# Patient Record
Sex: Male | Born: 1955
Health system: Southern US, Community
[De-identification: ages and names within clinical notes are randomized; demographics above are authoritative.]

## PROBLEM LIST (undated history)

## (undated) DIAGNOSIS — E119 Type 2 diabetes mellitus without complications: Secondary | ICD-10-CM

## (undated) DIAGNOSIS — G4733 Obstructive sleep apnea (adult) (pediatric): Secondary | ICD-10-CM

## (undated) DIAGNOSIS — E785 Hyperlipidemia, unspecified: Secondary | ICD-10-CM

## (undated) DIAGNOSIS — C169 Malignant neoplasm of stomach, unspecified: Secondary | ICD-10-CM

## (undated) DIAGNOSIS — E079 Disorder of thyroid, unspecified: Secondary | ICD-10-CM

## (undated) DIAGNOSIS — E291 Testicular hypofunction: Secondary | ICD-10-CM

## (undated) DIAGNOSIS — I2510 Atherosclerotic heart disease of native coronary artery without angina pectoris: Secondary | ICD-10-CM

## (undated) DIAGNOSIS — I10 Essential (primary) hypertension: Secondary | ICD-10-CM

## (undated) HISTORY — DX: Hyperlipidemia, unspecified: E78.5

## (undated) HISTORY — DX: Disorder of thyroid, unspecified: E07.9

## (undated) HISTORY — DX: Obstructive sleep apnea (adult) (pediatric): G47.33

## (undated) HISTORY — DX: Type 2 diabetes mellitus without complications: E11.9

## (undated) HISTORY — DX: Atherosclerotic heart disease of native coronary artery without angina pectoris: I25.10

## (undated) HISTORY — PX: APPENDECTOMY: SHX54

## (undated) HISTORY — DX: Testicular hypofunction: E29.1

## (undated) HISTORY — PX: CARDIAC CATHETERIZATION: SHX172

## (undated) HISTORY — DX: Essential (primary) hypertension: I10

---

## 1999-07-28 ENCOUNTER — Encounter: Payer: Self-pay | Admitting: *Deleted

## 1999-07-28 ENCOUNTER — Ambulatory Visit (HOSPITAL_COMMUNITY): Admission: RE | Admit: 1999-07-28 | Discharge: 1999-07-28 | Payer: Self-pay | Admitting: *Deleted

## 2000-03-08 ENCOUNTER — Ambulatory Visit (HOSPITAL_COMMUNITY): Admission: RE | Admit: 2000-03-08 | Discharge: 2000-03-08 | Payer: Self-pay | Admitting: *Deleted

## 2000-03-08 ENCOUNTER — Encounter: Payer: Self-pay | Admitting: *Deleted

## 2000-09-05 ENCOUNTER — Ambulatory Visit (HOSPITAL_COMMUNITY): Admission: RE | Admit: 2000-09-05 | Discharge: 2000-09-05 | Payer: Self-pay | Admitting: *Deleted

## 2000-09-05 ENCOUNTER — Encounter (INDEPENDENT_AMBULATORY_CARE_PROVIDER_SITE_OTHER): Payer: Self-pay | Admitting: *Deleted

## 2003-09-28 ENCOUNTER — Ambulatory Visit (HOSPITAL_BASED_OUTPATIENT_CLINIC_OR_DEPARTMENT_OTHER): Admission: RE | Admit: 2003-09-28 | Discharge: 2003-09-28 | Payer: Self-pay | Admitting: Internal Medicine

## 2008-09-11 ENCOUNTER — Emergency Department (HOSPITAL_COMMUNITY): Admission: EM | Admit: 2008-09-11 | Discharge: 2008-09-11 | Payer: Self-pay | Admitting: *Deleted

## 2010-11-12 ENCOUNTER — Ambulatory Visit (HOSPITAL_COMMUNITY)
Admission: RE | Admit: 2010-11-12 | Discharge: 2010-11-12 | Payer: Self-pay | Source: Home / Self Care | Admitting: Cardiology

## 2011-02-23 LAB — GLUCOSE, CAPILLARY
Glucose-Capillary: 144 mg/dL — ABNORMAL HIGH (ref 70–99)
Glucose-Capillary: 171 mg/dL — ABNORMAL HIGH (ref 70–99)

## 2013-10-11 ENCOUNTER — Encounter: Payer: Self-pay | Admitting: Podiatry

## 2013-10-11 ENCOUNTER — Ambulatory Visit (INDEPENDENT_AMBULATORY_CARE_PROVIDER_SITE_OTHER): Payer: BC Managed Care – PPO | Admitting: Podiatry

## 2013-10-11 VITALS — BP 140/79 | HR 70 | Resp 20 | Ht 72.0 in | Wt 260.0 lb

## 2013-10-11 DIAGNOSIS — M722 Plantar fascial fibromatosis: Secondary | ICD-10-CM

## 2013-10-11 NOTE — Progress Notes (Signed)
Zachary Bowers presents today to pick up his orthotics. He also states he stepped on a nail. States that the foot feels fine no problems from the nail. As he reversed the left foot.  Objective: Vital signs are stable he is alert and oriented x3 pulses remain palpable to the bilateral lower extremity. Evaluation of the left foot does not demonstrate any signs of puncture wound to the plantar aspect left. This is obviously going to heal uneventfully. Orthotics were dispensed and orthotics were not correct they will be sent back.  Assessment: Plantar fasciitis pes planus puncture wound left.  Plan:  Seen back  his orthotics will followup with him was to come in.

## 2013-10-11 NOTE — Patient Instructions (Signed)

## 2013-11-05 ENCOUNTER — Encounter: Payer: Self-pay | Admitting: Podiatry

## 2014-03-25 ENCOUNTER — Encounter: Payer: Self-pay | Admitting: Cardiology

## 2014-03-25 ENCOUNTER — Ambulatory Visit (INDEPENDENT_AMBULATORY_CARE_PROVIDER_SITE_OTHER): Payer: BC Managed Care – PPO | Admitting: Cardiology

## 2014-03-25 VITALS — BP 150/80 | HR 74 | Ht 72.0 in | Wt 254.8 lb

## 2014-03-25 DIAGNOSIS — I10 Essential (primary) hypertension: Secondary | ICD-10-CM | POA: Insufficient documentation

## 2014-03-25 DIAGNOSIS — E669 Obesity, unspecified: Secondary | ICD-10-CM | POA: Insufficient documentation

## 2014-03-25 DIAGNOSIS — I208 Other forms of angina pectoris: Secondary | ICD-10-CM | POA: Insufficient documentation

## 2014-03-25 DIAGNOSIS — I25810 Atherosclerosis of coronary artery bypass graft(s) without angina pectoris: Secondary | ICD-10-CM

## 2014-03-25 DIAGNOSIS — E7800 Pure hypercholesterolemia, unspecified: Secondary | ICD-10-CM | POA: Insufficient documentation

## 2014-03-25 MED ORDER — ISOSORBIDE MONONITRATE ER 30 MG PO TB24: 30.0000 mg | ORAL_TABLET | Freq: Every day | ORAL | Status: DC

## 2014-03-25 MED ORDER — ATORVASTATIN CALCIUM 20 MG PO TABS: 20.0000 mg | ORAL_TABLET | Freq: Every day | ORAL | Status: DC

## 2014-03-25 NOTE — Patient Instructions (Signed)
Your physician has recommended you make the following change in your medication:   1. Stop Simvastatin 2. Start Atorvastatin 20 mg once daily. 3. Start Imdur 30 mg once daily.  Your physician recommends that you return for lab work in: 3 months, Lipid, ALT  Your physician recommends that you schedule a follow-up appointment in: 3 months

## 2014-03-25 NOTE — Progress Notes (Signed)
[facility] [facility]. [address] [place], [place]  [address] Phone: [phone] Fax:  [phone]  Date:  03/25/2014   ID:  Zachary Bowers, DOB December 04, 1956, MRN [id number]  PCP:  Shirline Frees, MD   History of Present Illness: Zachary Bowers is a 58 y.o. male with obesity, hypertension, diabetes, obstructive sleep apnea, with moderate CAD from cath 11/11 - (hazy 60% LAD stenosis distal to first diagonal branch, 30-40% stenosis first obtuse marginal branch takeoff, aneurysmal segment distal RCA bifurcation of PDA, normal EF-no anterior ischemia on nuclear stress, mild inferior ischemia-med mgt. LDL was 147 down to 90's.  A nuclear stress test was performed 11/11and showed a mild degree of ischemia along the inferior wall distribution. His cardiac catheterization was once again reviewed with him and revealed a moderate lesion in the LAD up to 60%.  RCA distribution most likely responsible for his ischemia. Because of this, we opted for medical management.    He still states that still has shortness of breath and chest pain. He knows to call me if he is having any further worsening symptoms. My goal is to delay any worsening of his coronary artery disease. Getting pain once or twice a week. Lasts about 20-68min. Can be while sitting. Felt hard to swallow at the time. however does not correlate with eating. I offered him nitroglycerin PRN, he did not wish to take that at this time. We discussed isosorbide and he seems willing to take.  Same discussion as prior.      Wt Readings from Last 3 Encounters:  03/25/14 254 lb 12.8 oz (115.577 kg)  10/11/13 260 lb (117.935 kg)     Past Medical History  Diagnosis Date  . Diabetes mellitus without complication   . Thyroid disease   . Hypertension     Past Surgical History  Procedure Laterality Date  . Appendectomy      Current Outpatient Prescriptions  Medication Sig Dispense Refill  . amLODipine (NORVASC) 5 MG tablet Take 5 mg by  mouth daily.      Marland Kitchen aspirin 81 MG tablet Take 81 mg by mouth daily.      Marland Kitchen glucose blood test strip 1 each by Other route as needed for other. Use as instructed      . levothyroxine (SYNTHROID, LEVOTHROID) 75 MCG tablet Take 75 mcg by mouth daily before breakfast.      . lisinopril (PRINIVIL,ZESTRIL) 20 MG tablet Take 20 mg by mouth daily.      . metoprolol succinate (TOPROL-XL) 50 MG 24 hr tablet Take 50 mg by mouth daily. Take with or immediately following a meal.      . simvastatin (ZOCOR) 20 MG tablet Take 20 mg by mouth every evening.       No current facility-administered medications for this visit.    Allergies:    Allergies  Allergen Reactions  . Niaspan [Niacin Er] Itching    Social History:  The patient  reports that he has quit smoking. He does not have any smokeless tobacco history on file. He reports that he does not drink alcohol or use illicit drugs.   ROS:  Please see the history of present illness.   Denies fevers, chills, orthopnea, PND    PHYSICAL EXAM: VS:  BP 150/80  Pulse 74  Ht 6' (1.829 m)  Wt 254 lb 12.8 oz (115.577 kg)  BMI 34.55 kg/m2 Well nourished, well developed, in no acute distress HEENT: normal Neck: no JVD Cardiac:  normal S1, S2; RRR; no murmur Lungs:  clear to auscultation bilaterally, no wheezing, rhonchi or rales Abd: soft, nontender, no hepatomegalyOverweight Ext: no edema Skin: warm and dry Neuro: no focal abnormalities noted  EKG:  03/25/14 - Sinus rhythm rate 74 with nonspecific ST flattening, possible old inferior infarct pattern.    ASSESSMENT AND PLAN:  1. Coronary artery disease-moderate severity, 60% moderate hazy LAD lesion after first diagonal takeoff. The RCA with aneurysmal segment to the PDA. Inferior wall ischemia on previous 2011 nuclear stress test. I do not believe that based upon nuclear stress test results LAD lesion was hemodynamically significant. Because of this, we continued with aggressive medical management,  hypertension, lipid control. Secondary prevention strategies. He is willing to try isosorbide 30 mg once a day. He was hesitant about nitroglycerin as he is had relatives take this in the past. We discussed the possibility of pursuing further testing such as repeating stress test or cardiac catheterization. At this time we will continue with aggressive medical management. 2. Angina-as above. 3. Hyperlipidemia-LDL goal 70. We will change him from simvastatin to atorvastatin 20 mg. Recheck lipid profile in 3 months. Increase atorvastatin is necessary. LDL goal 70. 4. Hypertension-medications reviewed. 5. Obesity-continue to encourage weight loss.  Signed, Candee Furbish, MD [facility]  03/25/2014 9:40 AM

## 2014-03-28 ENCOUNTER — Telehealth: Payer: Self-pay | Admitting: Cardiology

## 2014-03-28 NOTE — Telephone Encounter (Signed)
Since the patient started Imdur and Atorvastatin, he states he has had frequent headaches, stomach cramps and bloating, advised to hold both medication and I would send to Dr. Marlou Porch for review and call the patient back.

## 2014-03-28 NOTE — Telephone Encounter (Signed)
New Message:  Pt states he is calling about the meds he was placed on as of his last appt 4/13.... States ever since he has had a headache and his neck and shoulders are sore.. Pt would like a call back.Marland KitchenMarland Kitchen

## 2014-03-29 MED ORDER — ATORVASTATIN CALCIUM 20 MG PO TABS: 10.0000 mg | ORAL_TABLET | Freq: Every day | ORAL | Status: DC

## 2014-03-29 NOTE — Telephone Encounter (Signed)
Agree with holding both isosorbide and atorvastatin. The headache is likely from the isosorbide. Shoulder soreness may be from atorvastatin. What I would suggest is after approximately one more week, restart atorvastatin at half dose and monitor symptoms. We will stay off of isosorbide at this time.

## 2014-06-27 ENCOUNTER — Ambulatory Visit (INDEPENDENT_AMBULATORY_CARE_PROVIDER_SITE_OTHER): Payer: BC Managed Care – PPO | Admitting: Cardiology

## 2014-06-27 ENCOUNTER — Encounter: Payer: Self-pay | Admitting: Cardiology

## 2014-06-27 VITALS — BP 137/78 | HR 73 | Ht 72.0 in | Wt 254.0 lb

## 2014-06-27 DIAGNOSIS — I2089 Other forms of angina pectoris: Secondary | ICD-10-CM

## 2014-06-27 DIAGNOSIS — E669 Obesity, unspecified: Secondary | ICD-10-CM

## 2014-06-27 DIAGNOSIS — I25810 Atherosclerosis of coronary artery bypass graft(s) without angina pectoris: Secondary | ICD-10-CM

## 2014-06-27 DIAGNOSIS — I10 Essential (primary) hypertension: Secondary | ICD-10-CM

## 2014-06-27 DIAGNOSIS — I208 Other forms of angina pectoris: Secondary | ICD-10-CM

## 2014-06-27 NOTE — Patient Instructions (Signed)
The current medical regimen is effective;  continue present plan and medications.  Your physician has requested that you have a lexiscan myoview. For further information please visit [email or web address]. Please follow instruction sheet, as given.  Follow up in 3 months with Dr Marlou Porch.

## 2014-06-27 NOTE — Progress Notes (Addendum)
[place]. [address], [address] [place], [place]  [address] Phone: [phone] Fax:  [phone]  Date:  06/27/2014   ID:  Zachary Bowers, DOB May 14, 1956, MRN [id number]  PCP:  Shirline Frees, MD   History of Present Illness: Zachary Bowers is a 58 y.o. male with obesity, hypertension, diabetes, obstructive sleep apnea, with moderate CAD from cath 11/11 - (hazy 60% LAD stenosis distal to first diagonal branch, 30-40% stenosis first obtuse marginal branch takeoff, aneurysmal segment distal RCA bifurcation of PDA, normal EF-no anterior ischemia on nuclear stress, mild inferior ischemia-med mgt. LDL was 147 down to 90's.  A nuclear stress test was performed 11/11and showed a mild degree of ischemia along the inferior wall distribution. His cardiac catheterization was once again reviewed with him and revealed a moderate lesion in the LAD up to 60%.  RCA distribution most likely responsible for his ischemia. Because of this, we opted for medical management.    He still states that still has shortness of breath and chest pain. He knows to call me if he is having any further worsening symptoms. My goal is to delay any worsening of his coronary artery disease. Getting pain once or twice a week. Lasts about 20-38min. Can be while sitting. Felt hard to swallow at the time. however does not correlate with eating. I offered him nitroglycerin PRN, he did not wish to take that at this time. Same discussion as prior. We tried isosorbide and atorvastatin but he did not tolerate very well. He is taking very low-dose of atorvastatin.   Wt Readings from Last 3 Encounters:  06/27/14 254 lb (115.214 kg)  03/25/14 254 lb 12.8 oz (115.577 kg)  10/11/13 260 lb (117.935 kg)     Past Medical History  Diagnosis Date  . Diabetes mellitus without complication   . Thyroid disease   . Hypertension     Past Surgical History  Procedure Laterality Date  . Appendectomy      Current Outpatient Prescriptions   Medication Sig Dispense Refill  . amLODipine (NORVASC) 5 MG tablet Take 5 mg by mouth daily.      Marland Kitchen aspirin 81 MG tablet Take 81 mg by mouth daily.      Marland Kitchen atorvastatin (LIPITOR) 20 MG tablet Take 0.5 tablets (10 mg total) by mouth daily.  30 tablet  4  . Cyanocobalamin (VITAMIN B-12 IJ) Inject as directed as directed.      Marland Kitchen glucose blood test strip 1 each by Other route as needed for other. Use as instructed      . levothyroxine (SYNTHROID, LEVOTHROID) 75 MCG tablet Take 75 mcg by mouth daily before breakfast.      . lisinopril (PRINIVIL,ZESTRIL) 20 MG tablet Take 20 mg by mouth daily.      . metFORMIN (GLUCOPHAGE) 500 MG tablet 4 tabs po qd      . metoprolol succinate (TOPROL-XL) 50 MG 24 hr tablet Take 50 mg by mouth daily. Take with or immediately following a meal.       No current facility-administered medications for this visit.    Allergies:    Allergies  Allergen Reactions  . Niaspan [Niacin Er] Itching    Social History:  The patient  reports that he has quit smoking. He does not have any smokeless tobacco history on file. He reports that he does not drink alcohol or use illicit drugs.   ROS:  Please see the history of present illness.   Denies fevers, chills, orthopnea,  PND    PHYSICAL EXAM: VS:  BP 137/78  Pulse 73  Ht 6' (1.829 m)  Wt 254 lb (115.214 kg)  BMI 34.44 kg/m2 Well nourished, well developed, in no acute distress HEENT: normal Neck: no JVD Cardiac:  normal S1, S2; RRR; no murmur Lungs:  clear to auscultation bilaterally, no wheezing, rhonchi or rales Abd: soft, nontender, no hepatomegalyOverweight Ext: no edema Skin: warm and dry Neuro: no focal abnormalities noted  EKG: 06/27/14-sinus rhythm, 73, nonspecific ST-T wave changes/flattening.   03/25/14 - Sinus rhythm rate 74 with nonspecific ST flattening, possible old inferior infarct pattern.    ASSESSMENT AND PLAN:  1. Coronary artery disease-moderate severity, 60% moderate hazy LAD lesion after  first diagonal takeoff. The RCA with aneurysmal segment to the PDA. Inferior wall ischemia on previous 2011 nuclear stress test. We will proceed with nuclear stress test since it has been several years and his symptoms continue to the present and slightly progress. Secondary prevention strategies. Did not tolerate isosorbide. He was hesitant about nitroglycerin as he is had relatives take this in the past.  2. Angina-as above. 3. Hyperlipidemia-LDL goal 70. We will change him from simvastatin to atorvastatin 20 mg. Recheck lipid profile in 3 months. Increase atorvastatin is necessary. LDL goal 70. 4. Hypertension-medications reviewed. 5. Obesity-continue to encourage weight loss. 6. 3 month f/u  Signed, Candee Furbish, MD [facility]  06/27/2014 8:57 AM   Addendum: 07/05/14  Nuclear stress test overall was unchanged from prior nuclear stress test. There is mild inferior wall ischemia, overall low risk. We will continue with medical management. I'm comfortable with him proceeding with colonoscopy, Dr. Paulita Fujita. If his chest pain symptoms worsen or become more worrisome, cardiac catheterization can be entertained. He did not tolerate nitrates. Continue with beta blocker.  I will forward to Dr. Paulita Fujita.  Candee Furbish, MD

## 2014-07-04 ENCOUNTER — Ambulatory Visit (HOSPITAL_COMMUNITY): Payer: BC Managed Care – PPO | Attending: Cardiology | Admitting: Radiology

## 2014-07-04 VITALS — BP 112/74 | Ht 72.0 in | Wt 250.0 lb

## 2014-07-04 DIAGNOSIS — R0602 Shortness of breath: Secondary | ICD-10-CM | POA: Insufficient documentation

## 2014-07-04 DIAGNOSIS — E119 Type 2 diabetes mellitus without complications: Secondary | ICD-10-CM | POA: Insufficient documentation

## 2014-07-04 DIAGNOSIS — Z8249 Family history of ischemic heart disease and other diseases of the circulatory system: Secondary | ICD-10-CM | POA: Insufficient documentation

## 2014-07-04 DIAGNOSIS — Z87891 Personal history of nicotine dependence: Secondary | ICD-10-CM | POA: Insufficient documentation

## 2014-07-04 DIAGNOSIS — I208 Other forms of angina pectoris: Secondary | ICD-10-CM

## 2014-07-04 DIAGNOSIS — R079 Chest pain, unspecified: Secondary | ICD-10-CM

## 2014-07-04 DIAGNOSIS — I2510 Atherosclerotic heart disease of native coronary artery without angina pectoris: Secondary | ICD-10-CM | POA: Insufficient documentation

## 2014-07-04 DIAGNOSIS — I25810 Atherosclerosis of coronary artery bypass graft(s) without angina pectoris: Secondary | ICD-10-CM

## 2014-07-04 MED ORDER — TECHNETIUM TC 99M SESTAMIBI GENERIC - CARDIOLITE
10.0000 | Freq: Once | INTRAVENOUS | Status: AC | PRN
  Administered 2014-07-04: 10 via INTRAVENOUS

## 2014-07-04 MED ORDER — TECHNETIUM TC 99M SESTAMIBI GENERIC - CARDIOLITE
30.0000 | Freq: Once | INTRAVENOUS | Status: AC | PRN
  Administered 2014-07-04: 30 via INTRAVENOUS

## 2014-07-04 MED ORDER — REGADENOSON 0.4 MG/5ML IV SOLN
0.4000 mg | Freq: Once | INTRAVENOUS | Status: AC
  Administered 2014-07-04: 0.4 mg via INTRAVENOUS

## 2014-07-04 NOTE — Progress Notes (Signed)
[facility] [address] [place], [place] [address] [phone]    Cardiology Nuclear Med Study  Zachary Bowers is a 58 y.o. male     MRN : [id number]     DOB: Oct 19, 1956  Procedure Date: 07/04/2014  Nuclear Med Background Indication for Stress Test:  Evaluation for Ischemia and Abnormal EKG History:  CAD-CATH-N/O Tx Rx, '11 MPI: Ischemia Inferior wall Cardiac Risk Factors: Family History - CAD, History of Smoking, Lipids and NIDDM  Symptoms:  Chest Pain and SOB   Nuclear Pre-Procedure Caffeine/Decaff Intake:  None NPO After: 7:00pm   Lungs:  clear O2 Sat: 98% on room air. IV 0.9% NS with Angio Cath:  22g  IV Site: R Hand  IV Started by:  Crissie Figures, RN  Chest Size (in):  48 Cup Size: n/a  Height: 6' (1.829 m)  Weight:  250 lb (113.399 kg)  BMI:  Body mass index is 33.9 kg/(m^2). Tech Comments:  N/A    Nuclear Med Study 1 or 2 day study: 1 day  Stress Test Type:  Lexiscan  Reading MD: N/A  Order Authorizing Provider:  Candee Furbish, MD  Resting Radionuclide: Technetium 37m Sestamibi  Resting Radionuclide Dose: 11.0 mCi   Stress Radionuclide:  Technetium 22m Sestamibi  Stress Radionuclide Dose: 33.0 mCi           Stress Protocol Rest HR: 62 Stress HR: 99  Rest BP: 112/74 Stress BP: 127/75  Exercise Time (min): n/a METS: n/a   Predicted Max HR: 161 bpm % Max HR: 61.49 bpm Rate Pressure Product: 12573   Dose of Adenosine (mg):  n/a Dose of Lexiscan: 0.4 mg  Dose of Atropine (mg): n/a Dose of Dobutamine: n/a mcg/kg/min (at max HR)  Stress Test Technologist: Perrin Maltese, EMT-P  Nuclear Technologist:  Vedia Pereyra, CNMT     Rest Procedure:  Myocardial perfusion imaging was performed at rest 45 minutes following the intravenous administration of Technetium 63m Sestamibi. Rest ECG: NSR low voltage nonspecific ST segment changes  Stress Procedure:  The patient received IV Lexiscan 0.4 mg over 15-seconds.  Technetium 38m Sestamibi  injected at 30-seconds. This patient had fatigue, sob, and a headache with the Lexiscan injection. Quantitative spect images were obtained after a 45 minute delay. Stress ECG: Abnormal with inferior T wave changes  QPS Raw Data Images:  Normal; no motion artifact; normal heart/lung ratio. Stress Images:  There is decreased uptake in the inferior wall. Rest Images:  Normal homogeneous uptake in all areas of the myocardium. Subtraction (SDS):  These findings are consistent with ischemia. Transient Ischemic Dilatation (Normal <1.22):  1.09 Lung/Heart Ratio (Normal <0.45):  0.32  Quantitative Gated Spect Images QGS EDV:  139 ml QGS ESV:  72 ml  Impression Exercise Capacity:  Lexiscan with low level exercise. BP Response:  Normal blood pressure response. Clinical Symptoms:  There is dyspnea. ECG Impression:  Significant ST abnormalities consistent with ischemia. Comparison with Prior Nuclear Study: No images to compare  Overall Impression:  Low risk stress nuclear study Mild inferior wall ischemia at mid and basal level  EF mildly reduced.  LV Ejection Fraction: 48%.  LV Wall Motion:  Mildly reduced diffuse  [place]

## 2014-07-09 ENCOUNTER — Telehealth: Payer: Self-pay | Admitting: Cardiology

## 2014-07-09 NOTE — Telephone Encounter (Signed)
Reviewed results of testing with pt and advised he is to f/u in 6 months per Dr Marlou Porch.

## 2014-07-09 NOTE — Telephone Encounter (Signed)
Follow up    Patient calling back stating the nurse called him today.

## 2015-01-13 ENCOUNTER — Ambulatory Visit (INDEPENDENT_AMBULATORY_CARE_PROVIDER_SITE_OTHER): Payer: BC Managed Care – PPO | Admitting: Cardiology

## 2015-01-13 ENCOUNTER — Other Ambulatory Visit: Payer: Self-pay | Admitting: *Deleted

## 2015-01-13 ENCOUNTER — Encounter: Payer: Self-pay | Admitting: Cardiology

## 2015-01-13 VITALS — BP 130/84 | HR 75 | Ht 72.0 in | Wt 251.0 lb

## 2015-01-13 DIAGNOSIS — E785 Hyperlipidemia, unspecified: Secondary | ICD-10-CM

## 2015-01-13 DIAGNOSIS — I208 Other forms of angina pectoris: Secondary | ICD-10-CM

## 2015-01-13 DIAGNOSIS — I2583 Coronary atherosclerosis due to lipid rich plaque: Principal | ICD-10-CM

## 2015-01-13 DIAGNOSIS — I2510 Atherosclerotic heart disease of native coronary artery without angina pectoris: Secondary | ICD-10-CM

## 2015-01-13 DIAGNOSIS — I10 Essential (primary) hypertension: Secondary | ICD-10-CM

## 2015-01-13 NOTE — Patient Instructions (Signed)
Your physician recommends that you continue on your current medications as directed. Please refer to the Current Medication list given to you today.  Your physician wants you to follow-up in: 6 months with Dr. Skains. You will receive a reminder letter in the mail two months in advance. If you don't receive a letter, please call our office to schedule the follow-up appointment.  

## 2015-01-13 NOTE — Progress Notes (Signed)
[place]. [address], [address] [place], [place]  [address] Phone: [phone] Fax:  [phone]  Date:  01/13/2015   ID:  Zachary Bowers, DOB 07-14-1956, MRN [id number]  PCP:  Shirline Frees, MD   History of Present Illness: Zachary Bowers is a 59 y.o. male with obesity, hypertension, diabetes, obstructive sleep apnea, with moderate CAD from cath 11/11 - (hazy 60% LAD stenosis distal to first diagonal branch, 30-40% stenosis first obtuse marginal branch takeoff, aneurysmal segment distal RCA bifurcation of PDA, normal EF-no anterior ischemia on nuclear stress on 2011 as well as 2015, mild inferior ischemia-med mgt. LDL was 147 down to 90's.  RCA distribution most likely responsible for his ischemia. Because of this, we opted for medical management.    He still states that still has shortness of breath and chest pain. He knows to call me if he is having any further worsening symptoms. My goal is to delay any worsening of his coronary artery disease.  Still getting pain once or twice a week. Lasts about 20-9min.  Felt hard to swallow at the time. However does not correlate with eating. I offered him nitroglycerin PRN, he did not wish to take that at this time. Same discussion as prior.  We tried isosorbide. He is taking very low-dose of atorvastatin.     Wt Readings from Last 3 Encounters:  01/13/15 251 lb (113.853 kg)  07/04/14 250 lb (113.399 kg)  06/27/14 254 lb (115.214 kg)     Past Medical History  Diagnosis Date  . Diabetes mellitus without complication   . Thyroid disease   . Hypertension   . OSA (obstructive sleep apnea)   . Coronary artery disease   . Hyperlipidemia   . Hypogonadism in male     Past Surgical History  Procedure Laterality Date  . Appendectomy      Current Outpatient Prescriptions  Medication Sig Dispense Refill  . amLODipine (NORVASC) 5 MG tablet Take 5 mg by mouth daily.    Marland Kitchen aspirin 81 MG tablet Take 81 mg by mouth daily.    Marland Kitchen  atorvastatin (LIPITOR) 20 MG tablet Take 0.5 tablets (10 mg total) by mouth daily. 30 tablet 4  . Cyanocobalamin (VITAMIN B-12 IJ) Inject as directed as directed.    Marland Kitchen glucose blood test strip 1 each by Other route as needed for other. Use as instructed    . INVOKANA 100 MG TABS tablet Take 100 mg by mouth daily.   0  . levothyroxine (SYNTHROID, LEVOTHROID) 125 MCG tablet Take 125 mcg by mouth daily before breakfast.   0  . lisinopril (PRINIVIL,ZESTRIL) 20 MG tablet Take 20 mg by mouth daily.    . metFORMIN (GLUCOPHAGE) 500 MG tablet Take 500 mg by mouth daily. 4 tabs po qd     No current facility-administered medications for this visit.    Allergies:    Allergies  Allergen Reactions  . Niaspan [Niacin Er] Itching    Social History:  The patient  reports that he has quit smoking. He does not have any smokeless tobacco history on file. He reports that he does not drink alcohol or use illicit drugs.   ROS:  Please see the history of present illness.   Denies fevers, chills, orthopnea, PND    PHYSICAL EXAM: VS:  BP 130/84 mmHg  Pulse 75  Ht 6' (1.829 m)  Wt 251 lb (113.853 kg)  BMI 34.03 kg/m2 Well nourished, well developed, in no acute distress HEENT: normal  Neck: no JVD Cardiac:  normal S1, S2; RRR; no murmur Lungs:  clear to auscultation bilaterally, no wheezing, rhonchi or rales Abd: soft, nontender, no hepatomegalyOverweight Ext: no edema Skin: warm and dry Neuro: no focal abnormalities noted  EKG: 06/27/14-sinus rhythm, 73, nonspecific ST-T wave changes/flattening.   03/25/14 - Sinus rhythm rate 74 with nonspecific ST flattening, possible old inferior infarct pattern.    NUC stress 06/2015: Nuclear stress test overall was unchanged from prior nuclear stress test. There is mild inferior wall ischemia, overall low risk. We will continue with medical management.  ASSESSMENT AND PLAN:  1. Coronary artery disease-2011 cardiac catheterization-moderate severity, 60% moderate  hazy LAD lesion after first diagonal takeoff. The RCA with aneurysmal segment to the PDA. Inferior wall ischemia as was on previous 2011 and 2015 nuclear stress test.  Secondary prevention strategies. Did not tolerate isosorbide. He was hesitant about nitroglycerin as he is had relatives take this in the past. I offered him cardiac catheterization via radial artery approach again but he respectfully declined at this time. If his symptoms worsen or become more worrisome, we will have a low threshold for diagnostic angiogram. 2. Angina-as above. 3. Hyperlipidemia-LDL goal 70.  Increase atorvastatin is necessary. LDL goal 70. Changed previously from simvastatin to atorvastatin, higher potency statin. 4. Hypertension-medications reviewed. Well-controlled 5. Obesity-continue to encourage weight loss. 6. 6 month f/u  Signed, Candee Furbish, MD [facility]  01/13/2015 8:50 AM

## 2015-04-01 ENCOUNTER — Other Ambulatory Visit: Payer: Self-pay | Admitting: Cardiology

## 2017-01-25 ENCOUNTER — Other Ambulatory Visit: Payer: Self-pay | Admitting: Family Medicine

## 2017-01-25 DIAGNOSIS — R079 Chest pain, unspecified: Secondary | ICD-10-CM

## 2017-01-26 ENCOUNTER — Telehealth: Payer: Self-pay

## 2017-01-26 ENCOUNTER — Other Ambulatory Visit: Payer: Self-pay | Admitting: Family Medicine

## 2017-01-26 ENCOUNTER — Ambulatory Visit
Admission: RE | Admit: 2017-01-26 | Discharge: 2017-01-26 | Disposition: A | Discharge status: Home / Self Care | Payer: BC Managed Care – PPO | Source: Ambulatory Visit | Attending: Family Medicine | Admitting: Family Medicine

## 2017-01-26 DIAGNOSIS — R0602 Shortness of breath: Secondary | ICD-10-CM

## 2017-01-26 NOTE — Telephone Encounter (Signed)
SENT NOTES TO SCHEDULING 

## 2017-01-27 ENCOUNTER — Encounter: Payer: Self-pay | Admitting: Cardiology

## 2017-01-27 ENCOUNTER — Ambulatory Visit (INDEPENDENT_AMBULATORY_CARE_PROVIDER_SITE_OTHER): Payer: BC Managed Care – PPO | Admitting: Cardiology

## 2017-01-27 ENCOUNTER — Ambulatory Visit
Admission: RE | Admit: 2017-01-27 | Discharge: 2017-01-27 | Disposition: A | Discharge status: Home / Self Care | Payer: BC Managed Care – PPO | Source: Ambulatory Visit | Attending: Family Medicine | Admitting: Family Medicine

## 2017-01-27 VITALS — BP 126/84 | HR 80 | Ht 72.0 in | Wt 243.8 lb

## 2017-01-27 DIAGNOSIS — E7800 Pure hypercholesterolemia, unspecified: Secondary | ICD-10-CM

## 2017-01-27 DIAGNOSIS — I2510 Atherosclerotic heart disease of native coronary artery without angina pectoris: Secondary | ICD-10-CM | POA: Diagnosis not present

## 2017-01-27 DIAGNOSIS — E6609 Other obesity due to excess calories: Secondary | ICD-10-CM

## 2017-01-27 DIAGNOSIS — I208 Other forms of angina pectoris: Secondary | ICD-10-CM

## 2017-01-27 DIAGNOSIS — I2583 Coronary atherosclerosis due to lipid rich plaque: Secondary | ICD-10-CM

## 2017-01-27 DIAGNOSIS — I10 Essential (primary) hypertension: Secondary | ICD-10-CM

## 2017-01-27 DIAGNOSIS — R079 Chest pain, unspecified: Secondary | ICD-10-CM

## 2017-01-27 NOTE — Patient Instructions (Signed)
Medication Instructions:  The current medical regimen is effective;  continue present plan and medications.  Testing/Procedures: Your physician has requested that you have a myoview. For further information please visit [email or web address]. Please follow instruction sheet, as given.  Follow-Up: Follow up in 6 months with Dr. Marlou Porch.  You will receive a letter in the mail 2 months before you are due.  Please call us when you receive this letter to schedule your follow up appointment.  If you need a refill on your cardiac medications before your next appointment, please call your pharmacy.  Thank you for choosing [facility]!!

## 2017-01-27 NOTE — Progress Notes (Signed)
Cardiology Office Note    Date:  01/27/2017   ID:  Franchot Naso, DOB [birth year], MRN [id number]  PCP:  Shirline Frees, MD  Cardiologist:   Candee Furbish, MD   Chief Complaint  Patient presents with  . Shortness of Breath  . Chest Pain    front to back mid sternum pain, 4/5 ps  . RT arm pain    4/5 ps, also pain at base of neck, some blurred vision and tingling in hands    History of Present Illness:  Zachary Bowers is a 61 y.o. male with obesity, hypertension, diabetes, obstructive sleep apnea, with moderate CAD from cath 11/11 - (hazy 60% LAD stenosis distal to first diagonal branch, 30-40% stenosis first obtuse marginal branch takeoff, aneurysmal segment distal RCA bifurcation of PDA, normal EF-no anterior ischemia on nuclear stress on 2011 as well as 2015, mild inferior ischemia-med mgt. LDL was 147 down to 90's.  RCA distribution most likely responsible for his ischemia. Because of this, we opted for medical management.   We tried isosorbide - HA. He is taking very low-dose of atorvastatin.  He recently had an episode [place] [employer] [place] where he had right-sided chest discomfort radiating down his right arm. EKG same as below. Nothing acute. Creatinine 1.41, hemoglobin A1c 7.2  Past Medical History:  Diagnosis Date  . Coronary artery disease   . Diabetes mellitus without complication ([place])   . Hyperlipidemia   . Hypertension   . Hypogonadism in male   . OSA (obstructive sleep apnea)   . Thyroid disease     Past Surgical History:  Procedure Laterality Date  . APPENDECTOMY      Current Medications: Outpatient Medications Prior to Visit  Medication Sig Dispense Refill  . amLODipine (NORVASC) 5 MG tablet Take 5 mg by mouth daily.    Marland Kitchen aspirin 81 MG tablet Take 81 mg by mouth daily.    Marland Kitchen atorvastatin (LIPITOR) 20 MG tablet Take 0.5 tablets (10 mg total) by mouth daily. 15 tablet 5  . glucose blood test strip 1 each by Other route as needed for  other. Use as instructed    . levothyroxine (SYNTHROID, LEVOTHROID) 125 MCG tablet Take 125 mcg by mouth daily before breakfast.   0  . lisinopril (PRINIVIL,ZESTRIL) 20 MG tablet Take 20 mg by mouth daily.    . INVOKANA 100 MG TABS tablet Take 100 mg by mouth daily.   0  . metFORMIN (GLUCOPHAGE) 500 MG tablet Take 500 mg by mouth daily. 4 tabs po qd    . Cyanocobalamin (VITAMIN B-12 IJ) Inject as directed as directed.     No facility-administered medications prior to visit.      Allergies:   Niaspan [niacin er]   Social History   Social History  . Marital status: Legally Separated    Spouse name: N/A  . Number of children: N/A  . Years of education: N/A   Social History Main Topics  . Smoking status: Former Research scientist (life sciences)  . Smokeless tobacco: Former Systems developer    Types: Chew  . Alcohol use No  . Drug use: No  . Sexual activity: Not Asked   Other Topics Concern  . None   Social History Narrative  . None     Family History:  The patient's family history includes Alzheimer's disease in his father; Heart disease in his mother.   ROS:   Please see the history of present illness.    ROS All other systems  reviewed and are negative.   PHYSICAL EXAM:   VS:  BP 126/84   Pulse 80   Ht 6' (1.829 m)   Wt 243 lb 12.8 oz (110.6 kg)   SpO2 97%   BMI 33.07 kg/m    GEN: Well nourished, well developed, in no acute distress  HEENT: normal  Neck: no JVD, carotid bruits, or masses Cardiac: RRR; no murmurs, rubs, or gallops,no edema  Respiratory:  clear to auscultation bilaterally, normal work of breathing GI: soft, nontender, nondistended, + BS MS: no deformity or atrophy  Skin: warm and dry, no rash Neuro:  Alert and Oriented x 3, Strength and sensation are intact Psych: euthymic mood, full affect  Wt Readings from Last 3 Encounters:  01/27/17 243 lb 12.8 oz (110.6 kg)  01/13/15 251 lb (113.9 kg)  07/04/14 250 lb (113.4 kg)      Studies/Labs Reviewed:   EKG:  EKG is ordered today.   The ekg ordered today demonstrates Sinus rhythm 79, poor R-wave progression, nonspecific T-wave changes personally viewed  Recent Labs: No results found for requested labs within last 8760 hours.   Lipid Panel No results found for: CHOL, TRIG, HDL, CHOLHDL, VLDL, LDLCALC, LDLDIRECT  Additional studies/ records that were reviewed today include:   Chest x-ray 01/26/17-normal  Nuclear stress tests 07/05/14- Nuclear stress test remains low risk with mild ischemia in the inferior wall distribution. No significant change from prior stress test    ASSESSMENT:    1. Angina decubitus ([place])   2. Coronary artery disease due to lipid rich plaque   3. Essential hypertension   4. Pure hypercholesterolemia   5. Class 1 obesity due to excess calories with serious comorbidity in adult, unspecified BMI      PLAN:  In order of problems listed above:  Coronary artery disease-2011 cardiac catheterization-moderate severity, 60% moderate hazy LAD lesion after first diagonal takeoff. The RCA with aneurysmal segment to the PDA. Inferior wall ischemia as was on previous 2011 and 2015 nuclear stress test.  Secondary prevention strategies. Did not tolerate isosorbide. He was hesitant about nitroglycerin as he is had relatives take this in the past. His pain is fairly atypical mostly right sided down right arm. Increasing fatigue. I will check a nuclear stress test since it has been a few years to make sure that he does not have any changes.  Angina-as above. Fairly atypical  Fatigue-agree with CPAP. He has not been using recently.  Hyperlipidemia-LDL goal 70.  Increase atorvastatin is necessary. LDL goal 70. Changed previously from simvastatin to atorvastatin, higher potency statin.  Hypertension-medications reviewed. Well-controlled  Obesity-continue to encourage weight loss. He is down a few pounds since our last visit  Diabetes-continue to closely monitor, Dr. Kenton Kingfisher  6 month f/u    Medication  Adjustments/Labs and Tests Ordered: Current medicines are reviewed at length with the patient today.  Concerns regarding medicines are outlined above.  Medication changes, Labs and Tests ordered today are listed in the Patient Instructions below. Patient Instructions  Medication Instructions:  The current medical regimen is effective;  continue present plan and medications.  Testing/Procedures: Your physician has requested that you have a myoview. For further information please visit [email or web address]. Please follow instruction sheet, as given.  Follow-Up: Follow up in 6 months with Dr. Marlou Porch.  You will receive a letter in the mail 2 months before you are due.  Please call us when you receive this letter to schedule your follow up appointment.  If you need a  refill on your cardiac medications before your next appointment, please call your pharmacy.  Thank you for choosing [facility]!!        Signed, Candee Furbish, MD  01/27/2017 3:11 PM    [facility] [address], [place], [place]  [address] Phone: [phone]; Fax: [phone]

## 2017-01-28 ENCOUNTER — Telehealth (HOSPITAL_COMMUNITY): Payer: Self-pay | Admitting: *Deleted

## 2017-01-28 NOTE — Telephone Encounter (Signed)
Left message on voicemail in reference to upcoming appointment scheduled for 02/01/17. Phone number given for a call back so details instructions can be given. Zachary Bowers

## 2017-01-31 ENCOUNTER — Telehealth (HOSPITAL_COMMUNITY): Payer: Self-pay | Admitting: *Deleted

## 2017-01-31 NOTE — Telephone Encounter (Signed)
Patient given detailed instructions per Myocardial Perfusion Study Information Sheet for the test on 02/01/17 at 0715. Patient notified to arrive 15 minutes early and that it is imperative to arrive on time for appointment to keep from having the test rescheduled.  If you need to cancel or reschedule your appointment, please call the office within 24 hours of your appointment. Failure to do so may result in a cancellation of your appointment, and a $50 no show fee. Patient verbalized understanding.Zachary Bowers, Ranae Palms

## 2017-02-01 ENCOUNTER — Ambulatory Visit (HOSPITAL_COMMUNITY): Payer: BC Managed Care – PPO | Attending: Cardiovascular Disease

## 2017-02-01 DIAGNOSIS — I208 Other forms of angina pectoris: Secondary | ICD-10-CM

## 2017-02-01 DIAGNOSIS — I2089 Other forms of angina pectoris: Secondary | ICD-10-CM

## 2017-02-01 DIAGNOSIS — E119 Type 2 diabetes mellitus without complications: Secondary | ICD-10-CM | POA: Insufficient documentation

## 2017-02-01 DIAGNOSIS — R079 Chest pain, unspecified: Secondary | ICD-10-CM | POA: Insufficient documentation

## 2017-02-01 DIAGNOSIS — I2510 Atherosclerotic heart disease of native coronary artery without angina pectoris: Secondary | ICD-10-CM

## 2017-02-01 DIAGNOSIS — I10 Essential (primary) hypertension: Secondary | ICD-10-CM | POA: Diagnosis not present

## 2017-02-01 DIAGNOSIS — I2583 Coronary atherosclerosis due to lipid rich plaque: Secondary | ICD-10-CM | POA: Diagnosis not present

## 2017-02-01 DIAGNOSIS — R0609 Other forms of dyspnea: Secondary | ICD-10-CM | POA: Diagnosis not present

## 2017-02-01 LAB — MYOCARDIAL PERFUSION IMAGING
CHL CUP NUCLEAR SDS: 0
CSEPEDS: 30 s
CSEPPHR: 146 {beats}/min
Estimated workload: 9.3 METS
Exercise duration (min): 7 min
LV sys vol: 51 mL
LVDIAVOL: 116 mL (ref 62–150)
MPHR: 159 {beats}/min
Percent HR: 91 %
RATE: 0.31
RPE: 19
Rest HR: 63 {beats}/min
SRS: 1
SSS: 1
TID: 0.93

## 2017-02-01 MED ORDER — TECHNETIUM TC 99M TETROFOSMIN IV KIT
32.9000 | PACK | Freq: Once | INTRAVENOUS | Status: AC | PRN
  Administered 2017-02-01: 32.9 via INTRAVENOUS
  Filled 2017-02-01: qty 33

## 2017-02-01 MED ORDER — TECHNETIUM TC 99M TETROFOSMIN IV KIT
10.6000 | PACK | Freq: Once | INTRAVENOUS | Status: AC | PRN
  Administered 2017-02-01: 10.6 via INTRAVENOUS
  Filled 2017-02-01: qty 11

## 2018-04-19 ENCOUNTER — Encounter: Payer: Self-pay | Admitting: *Deleted

## 2018-04-19 NOTE — Progress Notes (Addendum)
Opened in error

## 2020-09-15 DIAGNOSIS — Z23 Encounter for immunization: Secondary | ICD-10-CM | POA: Diagnosis not present

## 2020-09-15 DIAGNOSIS — I2510 Atherosclerotic heart disease of native coronary artery without angina pectoris: Secondary | ICD-10-CM | POA: Diagnosis not present

## 2020-09-15 DIAGNOSIS — Z1211 Encounter for screening for malignant neoplasm of colon: Secondary | ICD-10-CM | POA: Diagnosis not present

## 2020-09-15 DIAGNOSIS — I10 Essential (primary) hypertension: Secondary | ICD-10-CM | POA: Diagnosis not present

## 2020-09-15 DIAGNOSIS — E039 Hypothyroidism, unspecified: Secondary | ICD-10-CM | POA: Diagnosis not present

## 2020-09-15 DIAGNOSIS — Z7984 Long term (current) use of oral hypoglycemic drugs: Secondary | ICD-10-CM | POA: Diagnosis not present

## 2020-09-15 DIAGNOSIS — E1142 Type 2 diabetes mellitus with diabetic polyneuropathy: Secondary | ICD-10-CM | POA: Diagnosis not present

## 2020-09-15 DIAGNOSIS — E7800 Pure hypercholesterolemia, unspecified: Secondary | ICD-10-CM | POA: Diagnosis not present

## 2020-09-16 DIAGNOSIS — Z1211 Encounter for screening for malignant neoplasm of colon: Secondary | ICD-10-CM | POA: Diagnosis not present

## 2021-01-13 DIAGNOSIS — E119 Type 2 diabetes mellitus without complications: Secondary | ICD-10-CM | POA: Diagnosis not present

## 2021-04-10 DIAGNOSIS — Z125 Encounter for screening for malignant neoplasm of prostate: Secondary | ICD-10-CM | POA: Diagnosis not present

## 2021-04-10 DIAGNOSIS — Z0000 Encounter for general adult medical examination without abnormal findings: Secondary | ICD-10-CM | POA: Diagnosis not present

## 2021-04-10 DIAGNOSIS — E039 Hypothyroidism, unspecified: Secondary | ICD-10-CM | POA: Diagnosis not present

## 2021-04-10 DIAGNOSIS — Z23 Encounter for immunization: Secondary | ICD-10-CM | POA: Diagnosis not present

## 2021-04-10 DIAGNOSIS — I2510 Atherosclerotic heart disease of native coronary artery without angina pectoris: Secondary | ICD-10-CM | POA: Diagnosis not present

## 2021-04-10 DIAGNOSIS — E7800 Pure hypercholesterolemia, unspecified: Secondary | ICD-10-CM | POA: Diagnosis not present

## 2021-04-10 DIAGNOSIS — I10 Essential (primary) hypertension: Secondary | ICD-10-CM | POA: Diagnosis not present

## 2021-04-10 DIAGNOSIS — E1142 Type 2 diabetes mellitus with diabetic polyneuropathy: Secondary | ICD-10-CM | POA: Diagnosis not present

## 2021-04-28 DIAGNOSIS — H25813 Combined forms of age-related cataract, bilateral: Secondary | ICD-10-CM | POA: Diagnosis not present

## 2021-04-28 DIAGNOSIS — E119 Type 2 diabetes mellitus without complications: Secondary | ICD-10-CM | POA: Diagnosis not present

## 2021-04-28 DIAGNOSIS — H524 Presbyopia: Secondary | ICD-10-CM | POA: Diagnosis not present

## 2021-04-28 DIAGNOSIS — H52223 Regular astigmatism, bilateral: Secondary | ICD-10-CM | POA: Diagnosis not present

## 2021-07-27 ENCOUNTER — Emergency Department (HOSPITAL_COMMUNITY): Payer: Medicare PPO

## 2021-07-27 ENCOUNTER — Other Ambulatory Visit: Payer: Self-pay

## 2021-07-27 ENCOUNTER — Emergency Department (HOSPITAL_COMMUNITY)
Admission: EM | Admit: 2021-07-27 | Discharge: 2021-07-27 | Disposition: A | Discharge status: Home / Self Care | Payer: Medicare PPO | Attending: Emergency Medicine | Admitting: Emergency Medicine

## 2021-07-27 DIAGNOSIS — Z7984 Long term (current) use of oral hypoglycemic drugs: Secondary | ICD-10-CM | POA: Diagnosis not present

## 2021-07-27 DIAGNOSIS — Z87891 Personal history of nicotine dependence: Secondary | ICD-10-CM | POA: Insufficient documentation

## 2021-07-27 DIAGNOSIS — Z79899 Other long term (current) drug therapy: Secondary | ICD-10-CM | POA: Diagnosis not present

## 2021-07-27 DIAGNOSIS — R0602 Shortness of breath: Secondary | ICD-10-CM | POA: Diagnosis not present

## 2021-07-27 DIAGNOSIS — I2510 Atherosclerotic heart disease of native coronary artery without angina pectoris: Secondary | ICD-10-CM | POA: Diagnosis not present

## 2021-07-27 DIAGNOSIS — I10 Essential (primary) hypertension: Secondary | ICD-10-CM | POA: Insufficient documentation

## 2021-07-27 DIAGNOSIS — E119 Type 2 diabetes mellitus without complications: Secondary | ICD-10-CM | POA: Insufficient documentation

## 2021-07-27 DIAGNOSIS — Z7982 Long term (current) use of aspirin: Secondary | ICD-10-CM | POA: Diagnosis not present

## 2021-07-27 DIAGNOSIS — R079 Chest pain, unspecified: Secondary | ICD-10-CM | POA: Diagnosis not present

## 2021-07-27 DIAGNOSIS — R0789 Other chest pain: Secondary | ICD-10-CM | POA: Diagnosis not present

## 2021-07-27 LAB — CBC
HCT: 40 % (ref 39.0–52.0)
Hemoglobin: 12.9 g/dL — ABNORMAL LOW (ref 13.0–17.0)
MCH: 27.3 pg (ref 26.0–34.0)
MCHC: 32.3 g/dL (ref 30.0–36.0)
MCV: 84.6 fL (ref 80.0–100.0)
Platelets: 269 10*3/uL (ref 150–400)
RBC: 4.73 MIL/uL (ref 4.22–5.81)
RDW: 13.2 % (ref 11.5–15.5)
WBC: 5.8 10*3/uL (ref 4.0–10.5)
nRBC: 0 % (ref 0.0–0.2)

## 2021-07-27 LAB — BASIC METABOLIC PANEL
Anion gap: 8 (ref 5–15)
BUN: 10 mg/dL (ref 8–23)
CO2: 24 mmol/L (ref 22–32)
Calcium: 9.4 mg/dL (ref 8.9–10.3)
Chloride: 103 mmol/L (ref 98–111)
Creatinine, Ser: 1.22 mg/dL (ref 0.61–1.24)
GFR, Estimated: >60 mL/min (ref >60)
Glucose, Bld: 213 mg/dL — ABNORMAL HIGH (ref 70–99)
Potassium: 3.8 mmol/L (ref 3.5–5.1)
Sodium: 135 mmol/L (ref 135–145)

## 2021-07-27 LAB — TROPONIN I (HIGH SENSITIVITY)
Troponin I (High Sensitivity): 2 ng/L (ref <18)
Troponin I (High Sensitivity): 3 ng/L (ref <18)

## 2021-07-27 NOTE — Discharge Instructions (Addendum)
Your EKG and blood work here was very reassuring.  However, your symptoms are still concerning.  Please avoid any strenuous activity that produces shortness of breath or chest pain.  I have placed a referral to the cardiology clinic on [facility].  However, I would also call them yourself and attempt to be seen in their office as soon as possible.  Please follow-up with your primary care doctor as well.  Please do not hesitate to return to the emergency department if your symptoms should significantly change or worsen.

## 2021-07-27 NOTE — ED Notes (Signed)
Patient verbalizes understanding of discharge instructions. Opportunity for questioning and answers were provided. Armband removed by staff, pt discharged from ED ambulatory.   

## 2021-07-27 NOTE — ED Provider Notes (Signed)
[facility] EMERGENCY DEPARTMENT Provider Note   CSN: [id number] Arrival date & time: 07/27/21  1227     History Chief Complaint  Patient presents with   Chest Pain   Shortness of Breath    Zachary Bowers is a 65 y.o. male.   Chest Pain Associated symptoms: shortness of breath   Associated symptoms: no abdominal pain, no back pain, no cough, no fever, no palpitations and no vomiting   Shortness of Breath Associated symptoms: chest pain   Associated symptoms: no abdominal pain, no cough, no ear pain, no fever, no rash, no sore throat and no vomiting    65 year old male with a past medical history of hypertension, hyperlipidemia, diabetes, coronary artery disease managed medically presenting to the emergency department with chest pain.  Patient reports that he experienced pressure-like right-sided chest pain 2 nights ago.  It woke him from sleep around 2 AM.  It was associated with diaphoresis and nausea.  It resolved after about 3 hours and has not recurred since that time.  He states that in the last 2 days, he has felt some intermittent shortness of breath with exertion, but has not had any more chest pain.  He denies any fever, cough, nausea, vomiting, or diarrhea.  He states that he has not followed with his cardiologist for the past 5 years, but used to see the [facility] health [facility].  He denies any leg swelling.  He is currently chest pain-free.  Past Medical History:  Diagnosis Date   Coronary artery disease    Diabetes mellitus without complication ([place])    Hyperlipidemia    Hypertension    Hypogonadism in male    OSA (obstructive sleep apnea)    Thyroid disease     Patient Active Problem List   Diagnosis Date Noted   Coronary artery disease due to lipid rich plaque 01/13/2015   Essential hypertension 01/13/2015   Hyperlipidemia 01/13/2015   Angina decubitus ([place]) 03/25/2014   Pure hypercholesterolemia 03/25/2014   Obesity 03/25/2014   Essential  hypertension, benign 03/25/2014    Past Surgical History:  Procedure Laterality Date   APPENDECTOMY         Family History  Problem Relation Age of Onset   Heart disease Mother    Alzheimer's disease Father     Social History   Tobacco Use   Smoking status: Former   Smokeless tobacco: Former    Types: Chew  Substance Use Topics   Alcohol use: No   Drug use: No    Home Medications Prior to Admission medications   Medication Sig Start Date End Date Taking? Authorizing Provider  amLODipine (NORVASC) 5 MG tablet Take 5 mg by mouth daily.    [provider]  aspirin 81 MG tablet Take 81 mg by mouth daily.    [provider]  atorvastatin (LIPITOR) 20 MG tablet Take 0.5 tablets (10 mg total) by mouth daily. 04/02/15   Jerline Pain, MD  glucose blood test strip 1 each by Other route as needed for other. Use as instructed    [provider]  JARDIANCE 25 MG TABS tablet Take 25 mg by mouth daily. 01/26/17   [provider]  levothyroxine (SYNTHROID, LEVOTHROID) 125 MCG tablet Take 125 mcg by mouth daily before breakfast.  12/19/14   [provider]  lisinopril (PRINIVIL,ZESTRIL) 20 MG tablet Take 20 mg by mouth daily.    [provider]  losartan (COZAAR) 100 MG tablet Take 100 mg by  mouth daily. 01/26/17   [provider]  metFORMIN (GLUCOPHAGE-XR) 500 MG 24 hr tablet Take 500 mg by mouth 2 (two) times daily. 12/31/16   [provider]    Allergies    Niaspan [niacin er]  Review of Systems   Review of Systems  Constitutional:  Negative for chills and fever.  HENT:  Negative for ear pain and sore throat.   Eyes:  Negative for pain and visual disturbance.  Respiratory:  Positive for shortness of breath. Negative for cough.   Cardiovascular:  Positive for chest pain. Negative for palpitations.  Gastrointestinal:  Negative for abdominal pain and vomiting.  Genitourinary:  Negative for dysuria and hematuria.   Musculoskeletal:  Negative for arthralgias and back pain.  Skin:  Negative for color change and rash.  Neurological:  Negative for seizures and syncope.  All other systems reviewed and are negative.  Physical Exam Updated Vital Signs BP 134/84 (BP Location: Left Arm)   Pulse 66   Temp 98.4 F (36.9 C)   Resp 18   SpO2 99%   Physical Exam Vitals and nursing note reviewed.  Constitutional:      General: He is not in acute distress.    Appearance: He is well-developed and normal weight. He is not ill-appearing or toxic-appearing.  HENT:     Head: Normocephalic and atraumatic.  Eyes:     Conjunctiva/sclera: Conjunctivae normal.  Cardiovascular:     Rate and Rhythm: Normal rate and regular rhythm.     Heart sounds: No murmur heard. Pulmonary:     Effort: Pulmonary effort is normal. No respiratory distress.     Breath sounds: Normal breath sounds.  Abdominal:     Palpations: Abdomen is soft.     Tenderness: There is no abdominal tenderness.  Musculoskeletal:     Cervical back: Neck supple.     Right lower leg: No edema.     Left lower leg: No edema.  Skin:    General: Skin is warm and dry.     Capillary Refill: Capillary refill takes less than 2 seconds.  Neurological:     Mental Status: He is alert and oriented to person, place, and time.    ED Results / Procedures / Treatments   Labs (all labs ordered are listed, but only abnormal results are displayed) Labs Reviewed  BASIC METABOLIC PANEL - Abnormal; Notable for the following components:      Result Value   Glucose, Bld 213 (*)    All other components within normal limits  CBC - Abnormal; Notable for the following components:   Hemoglobin 12.9 (*)    All other components within normal limits  RESP PANEL BY RT-PCR (FLU A&B, COVID) ARPGX2  TROPONIN I (HIGH SENSITIVITY)  TROPONIN I (HIGH SENSITIVITY)    EKG EKG Interpretation  Date/Time:  Monday July 27 2021 13:02:29 EDT Ventricular Rate:  86 PR  Interval:  172 QRS Duration: 90 QT Interval:  364 QTC Calculation: 435 R Axis:   -15 Text Interpretation: Normal sinus rhythm Nonspecific T wave abnormality Abnormal ECG Confirmed by Thamas Jaegers ([id number]) on 07/27/2021 6:45:35 PM  Radiology DG Chest 1 View  Result Date: 07/27/2021 CLINICAL DATA:  Chest pain. EXAM: CHEST  1 VIEW COMPARISON:  Chest radiograph, 01/26/2017. FINDINGS: Cardiomediastinal size within normal limits. Normal lung inflation. No focal consolidation or mass. No pleural effusion or pneumothorax. No acute osseous abnormality. IMPRESSION: Normal chest. Electronically Signed   By: Michaelle Birks M.D.   On: 07/27/2021 13:33  Procedures Procedures   Medications Ordered in ED Medications - No data to display  ED Course  I have reviewed the triage vital signs and the nursing notes.  Pertinent labs & imaging results that were available during my care of the patient were reviewed by me and considered in my medical decision making (see chart for details).    MDM Rules/Calculators/A&P                           65 year old male with a past medical history of CAD that is being managed medically presenting to the emergency department with chest pain.  It woke him from sleep nearly 48 hours ago.  Vital signs reviewed on arrival, within acceptable limits.  Although the triage note reports that the patient had some chest pain on arrival to the ED, he currently denies any chest pain to me and is symptom-free.  The story he describes is very atypical for ACS.  I see documentation from 2018 from cardiologist that reports he has been diagnosed with angina decubitus, or atypical ACS presentations in the past.  Chest x-ray does not show any evidence of pneumothorax or pneumonia.  CBC and CMP unremarkable.  Initial troponin of 3, repeat troponin of 3.  Although the patient has had atypical symptoms in the past, he is currently chest pain-free and has a reassuring work-up here, negative troponins  after his event 48 hours ago.  Presentation not consistent with PE or aortic dissection.  I discussed the patient's symptoms and the work-up with him at bedside.  After shared decision-making, we will discharge the patient from the emergency department.  I have placed an ambulatory care referral to cardiology for expedited outpatient follow-up.  He will also call the cardiologist that he followed with 5 years ago and attempt to establish close outpatient follow-up.  He does not wish to be admitted at this time.  I believe that is reasonable, given his reassuring work-up.  Strict return precautions discussed with the patient, provided in verbal and written format.  Final Clinical Impression(s) / ED Diagnoses Final diagnoses:  Chest pain, unspecified type    Rx / DC Orders ED Discharge Orders          Ordered    Ambulatory referral to Cardiology        07/27/21 1849             Claud Kelp, MD 07/27/21 Tiburcio Bash, MD 07/30/21 [phone]

## 2021-07-27 NOTE — ED Provider Notes (Signed)
Emergency Medicine Provider Triage Evaluation Note  Zachary Bowers , a 65 y.o. male  was evaluated in triage.  Pt complains of an episode of chest pain that woke him up from sleeping yesterday. Sxs associated with sob , diaphoresis and nausea. Cp 4/10 currently.  Review of Systems  Positive: Chest pain, sob, nausea, diaphoresis Negative: Leg swelling  Physical Exam  There were no vitals taken for this visit. Gen:   Awake, no distress   Resp:  Normal effort  MSK:   Moves extremities without difficulty  Other:  Heart rrr, lungs ctab, no peripheral edema  Medical Decision Making  Medically screening exam initiated at 12:59 PM.  Appropriate orders placed.  Zachary Bowers was informed that the remainder of the evaluation will be completed by another provider, this initial triage assessment does not replace that evaluation, and the importance of remaining in the ED until their evaluation is complete.     Rodney Booze, PA-C 07/27/21 1300    Lorelle Gibbs, DO 07/29/21 2052

## 2021-07-27 NOTE — ED Triage Notes (Signed)
Pt reports being woken out of sleep on Sunday by central chest pain, shortness of breath, diaphoresis. CP improved but has residual tingling in R arm.

## 2021-07-29 NOTE — Progress Notes (Signed)
Cardiology Office Note:   Date:  07/30/2021  NAME:  Zachary Bowers    MRN: [id number] DOB:  May 12, 1956   PCP:  Shirline Frees, MD  Cardiologist:  None  Electrophysiologist:  None   Referring MD: Luna Fuse, MD   Chief Complaint  Patient presents with   Chest Pain   History of Present Illness:   Zachary Bowers is a 65 y.o. male with a hx of CAD, DM, HTN, HLD who is being seen today for the evaluation of chest pain at the request of Shirline Frees, MD. Seen in ER 07/27/2021 for chest pain. Troponin negative x 2. EKG with nonspecific STT changes. He was seen in the emergency room on Monday.  Apparently he awoke from his sleep on Sunday night with right-sided chest pressure.  He reports his symptoms lasted 2 to 3 hours.  He reports he was short of breath and had a headache.  He was seen emergency room and ruled out for acute coronary syndrome.  EKG there showed nonspecific ST-T changes.  He also demonstrates nonspecific ST-T changes today.  No further chest pain episodes.  He apparently had an episode years ago.  Left heart catheterization in 2011 showed nonobstructive CAD.  He had a myocardial perfusion imaging study in 2018 that was negative.  He has never had a heart attack or stroke.  He is diabetic.  A1c 7.9.  LDL 82 which is not at goal.  He takes Lipitor 10 mg daily.  He reports he does not exercise routinely but does yard work.  He also works with his daughters horses.  He reports no issues without work.  No recurrence of chest pain symptoms.  He does have sleep apnea.  He cannot tolerate the machine.  I did inform him that his symptoms could be related to untreated sleep apnea.  He does have hypertension but his blood pressure is well controlled on Micardis.  Mother had heart disease.  He is a former smoker.  No alcohol or drug use is reported.  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  Problem List CAD -LHC 2011 60% LAD, 40% OM1 -negative MPI 02/01/2017 2.  DM -A1c 7.9 3. HTN 4. HLD -T chol 140, HDL 31, LDL 82, TG 155 5. OSA  Past Medical History: Past Medical History:  Diagnosis Date   Coronary artery disease    Diabetes mellitus without complication ([place])    Hyperlipidemia    Hypertension    Hypogonadism in male    OSA (obstructive sleep apnea)    Thyroid disease     Past Surgical History: Past Surgical History:  Procedure Laterality Date   APPENDECTOMY     APPENDECTOMY Right    CARDIAC CATHETERIZATION      Current Medications: Current Meds  Medication Sig   amLODipine (NORVASC) 10 MG tablet Take 10 mg by mouth daily.   aspirin 81 MG tablet Take 81 mg by mouth daily.   glimepiride (AMARYL) 4 MG tablet Take 4 mg by mouth every morning.   glucose blood test strip 1 each by Other route as needed for other. Use as instructed   levothyroxine (SYNTHROID, LEVOTHROID) 125 MCG tablet Take 125 mcg by mouth daily before breakfast.    metFORMIN (GLUCOPHAGE) 500 MG tablet Take 1,000 mg by mouth daily.   metoprolol tartrate (LOPRESSOR) 100 MG tablet Take 1 tablet by mouth once for procedure.   telmisartan (MICARDIS) 40 MG tablet Take 40 mg by mouth  daily.   [DISCONTINUED] atorvastatin (LIPITOR) 20 MG tablet Take 0.5 tablets (10 mg total) by mouth daily.   [DISCONTINUED] JARDIANCE 25 MG TABS tablet Take 25 mg by mouth daily.     Allergies:    Niaspan [niacin er]   Social History: Social History   Socioeconomic History   Marital status: Married    Spouse name: Not on file   Number of children: 2   Years of education: Not on file   Highest education level: Not on file  Occupational History   Occupation: Retired Works with Horses  Tobacco Use   Smoking status: Former   Smokeless tobacco: Former    Types: Chew  Substance and Sexual Activity   Alcohol use: No   Drug use: No   Sexual activity: Not on file  Other Topics Concern   Not on file  Social History Narrative   Not on file   Social Determinants of Health    Financial Resource Strain: Not on file  Food Insecurity: Not on file  Transportation Needs: Not on file  Physical Activity: Not on file  Stress: Not on file  Social Connections: Not on file     Family History: The patient's family history includes Alzheimer's disease in his father; Heart disease in his mother.  ROS:   All other ROS reviewed and negative. Pertinent positives noted in the HPI.     EKGs/Labs/Other Studies Reviewed:   The following studies were personally reviewed by me today:  EKG:  EKG is ordered today.  The ekg ordered today demonstrates NSR 78 bpm, nonspecific ST-T changes, and was personally reviewed by me.   Recent Labs: 07/27/2021: BUN 10; Creatinine, Ser 1.22; Hemoglobin 12.9; Platelets 269; Potassium 3.8; Sodium 135   Recent Lipid Panel No results found for: CHOL, TRIG, HDL, CHOLHDL, VLDL, LDLCALC, LDLDIRECT  Physical Exam:   VS:  BP 120/80 (BP Location: Right Arm)   Pulse 78   Ht 6' (1.829 m)   Wt 246 lb 6.4 oz (111.8 kg)   SpO2 95%   BMI 33.42 kg/m    Wt Readings from Last 3 Encounters:  07/30/21 246 lb 6.4 oz (111.8 kg)  02/01/17 243 lb (110.2 kg)  01/27/17 243 lb 12.8 oz (110.6 kg)    General: Well nourished, well developed, in no acute distress Head: Atraumatic, normal size  Eyes: PEERLA, EOMI  Neck: Supple, no JVD Endocrine: No thryomegaly Cardiac: Normal S1, S2; RRR; no murmurs, rubs, or gallops Lungs: Clear to auscultation bilaterally, no wheezing, rhonchi or rales  Abd: Soft, nontender, no hepatomegaly  Ext: No edema, pulses 2+ Musculoskeletal: No deformities, BUE and BLE strength normal and equal Skin: Warm and dry, no rashes   Neuro: Alert and oriented to person, place, time, and situation, CNII-XII grossly intact, no focal deficits  Psych: Normal mood and affect   ASSESSMENT:   Zachary Bowers is a 65 y.o. male who presents for the following: 1. Chest pain, unspecified type   2. Mixed hyperlipidemia   3. OSA (obstructive  sleep apnea)     PLAN:   1. Chest pain, unspecified type 2. Mixed hyperlipidemia -Possibly cardiac chest pain.  Seen in the ER and had negative troponins.  EKG demonstrated nonspecific ST-T changes.  EKG today demonstrates nonspecific ST-T changes.  He was awoken from sleep.  He has untreated sleep apnea.  I suspect his symptoms could be from sleep apnea.  His risk factors for CAD include a history of nonobstructive CAD on heart cath in  2011.  He is also diabetic.  He had a normal stress test in 2018 but things could have changed.  For further evaluation I recommended a coronary CTA.  He will take 100 mg of metoprolol tartrate charger for the scan.  His blood pressure is well controlled today in office.  I would like for him to get an echocardiogram as well.  His activity is not restricted.  His chest pain is not exertional and alleviated by rest.  This occurred at night and being awoken from sleep.  I really suspect this will be sleep apnea related but we will evaluate him with a coronary CTA and echocardiogram.  He is diabetic.  LDL goal less than 70.  Increase his Lipitor to 20 mg daily.  We will see him back in 4 months to discuss further.  3. OSA (obstructive sleep apnea) -Untreated sleep apnea.  Chest pain at night.  Suspect this is related to that.  Referral to [facility] for further evaluation.  He apparently has sleep apnea but cannot wear the mask he has.  Likely needs further titration.  Disposition: Return in about 4 months (around 11/29/2021).  Medication Adjustments/Labs and Tests Ordered: Current medicines are reviewed at length with the patient today.  Concerns regarding medicines are outlined above.  Orders Placed This Encounter  Procedures   CT CORONARY MORPH W/CTA COR W/SCORE W/CA W/CM &/OR WO/CM   Ambulatory referral to Sleep Studies   EKG 12-Lead   ECHOCARDIOGRAM COMPLETE    Meds ordered this encounter  Medications   atorvastatin (LIPITOR) 20 MG tablet    Sig: Take 1  tablet (20 mg total) by mouth daily.    Dispense:  90 tablet    Refill:  3   metoprolol tartrate (LOPRESSOR) 100 MG tablet    Sig: Take 1 tablet by mouth once for procedure.    Dispense:  1 tablet    Refill:  0     Patient Instructions  Medication Instructions:  Increase Lipitor to 20 mg daily  Take Metoprolol Tartrate 100 mg two hours before the CT.   *If you need a refill on your cardiac medications before your next appointment, please call your pharmacy*   Testing/Procedures: Echocardiogram - Your physician has requested that you have an echocardiogram. Echocardiography is a painless test that uses sound waves to create images of your heart. It provides your doctor with information about the size and shape of your heart and how well your heart's chambers and valves are working. This procedure takes approximately one hour. There are no restrictions for this procedure. This will be performed at our [facility] location - [address], [address].  Your physician has requested that you have cardiac CT. Cardiac computed tomography (CT) is a painless test that uses an x-ray machine to take clear, detailed pictures of your heart. For further information please visit [email or web address]. Please follow instruction sheet as given.    Follow-Up: At [facility], you and your health needs are our priority.  As part of our continuing mission to provide you with exceptional heart care, we have created designated Provider Care Teams.  These Care Teams include your primary Cardiologist (physician) and Advanced Practice Providers (APPs -  Physician Assistants and Nurse Practitioners) who all work together to provide you with the care you need, when you need it.  We recommend signing up for the patient portal called "MyChart".  Sign up information is provided on this After Visit Summary.  MyChart is  used to connect with patients for Virtual Visits (Telemedicine).  Patients are able to view lab/test  results, encounter notes, upcoming appointments, etc.  Non-urgent messages can be sent to your provider as well.   To learn more about what you can do with MyChart, go to [email or web address].    Your next appointment:   4 month(s)  The format for your next appointment:   In Person  Provider:   Eleonore Chiquito, MD   Other Instructions Referral to Fransico Him, MD for CPAP titration     Your cardiac CT will be scheduled at one of the below locations:   [facility] [address] [place], [place] [address] [phone]  If scheduled at [facility], please arrive at the [facility] main entrance (entrance A) of [facility] 30 minutes prior to test start time. Proceed to the [facility] Radiology Department (first floor) to check-in and test prep.  Please follow these instructions carefully (unless otherwise directed):  Hold all erectile dysfunction medications at least 3 days (72 hrs) prior to test.  On the Night Before the Test: Be sure to Drink plenty of water. Do not consume any caffeinated/decaffeinated beverages or chocolate 12 hours prior to your test. Do not take any antihistamines 12 hours prior to your test.  On the Day of the Test: Drink plenty of water until 1 hour prior to the test. Do not eat any food 4 hours prior to the test. You may take your regular medications prior to the test.  Take metoprolol (Lopressor) two hours prior to test. HOLD Furosemide/Hydrochlorothiazide morning of the test.      After the Test: Drink plenty of water. After receiving IV contrast, you may experience a mild flushed feeling. This is normal. On occasion, you may experience a mild rash up to 24 hours after the test. This is not dangerous. If this occurs, you can take Benadryl 25 mg and increase your fluid intake. If you experience trouble breathing, this can be serious. If it is severe call 911 IMMEDIATELY. If it is mild, please call our office. If  you take any of these medications: Glipizide/Metformin, Avandament, Glucavance, please do not take 48 hours after completing test unless otherwise instructed.  Please allow 2-4 weeks for scheduling of routine cardiac CTs. Some insurance companies require a pre-authorization which may delay scheduling of this test.   For non-scheduling related questions, please contact the cardiac imaging nurse navigator should you have any questions/concerns: Marchia Bond, Cardiac Imaging Nurse Navigator Gordy Clement, Cardiac Imaging Nurse Navigator [facility] Direct Office Dial: [phone]   For scheduling needs, including cancellations and rescheduling, please call [place], [phone].     Signed, Addison Naegeli. Audie Box, MD, [place]  [address], [place], [place] [address] [phone]  07/30/2021 9:51 AM

## 2021-07-29 NOTE — H&P (View-Only) (Signed)
Cardiology Office Note:   Date:  07/30/2021  NAME:  Zachary Bowers    MRN: [id number] DOB:  09-07-1956   PCP:  Shirline Frees, MD  Cardiologist:  None  Electrophysiologist:  None   Referring MD: Luna Fuse, MD   Chief Complaint  Patient presents with   Chest Pain   History of Present Illness:   Zachary Bowers is a 65 y.o. male with a hx of CAD, DM, HTN, HLD who is being seen today for the evaluation of chest pain at the request of Shirline Frees, MD. Seen in ER 07/27/2021 for chest pain. Troponin negative x 2. EKG with nonspecific STT changes. He was seen in the emergency room on Monday.  Apparently he awoke from his sleep on Sunday night with right-sided chest pressure.  He reports his symptoms lasted 2 to 3 hours.  He reports he was short of breath and had a headache.  He was seen emergency room and ruled out for acute coronary syndrome.  EKG there showed nonspecific ST-T changes.  He also demonstrates nonspecific ST-T changes today.  No further chest pain episodes.  He apparently had an episode years ago.  Left heart catheterization in 2011 showed nonobstructive CAD.  He had a myocardial perfusion imaging study in 2018 that was negative.  He has never had a heart attack or stroke.  He is diabetic.  A1c 7.9.  LDL 82 which is not at goal.  He takes Lipitor 10 mg daily.  He reports he does not exercise routinely but does yard work.  He also works with his daughters horses.  He reports no issues without work.  No recurrence of chest pain symptoms.  He does have sleep apnea.  He cannot tolerate the machine.  I did inform him that his symptoms could be related to untreated sleep apnea.  He does have hypertension but his blood pressure is well controlled on Micardis.  Mother had heart disease.  He is a former smoker.  No alcohol or drug use is reported.  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  Problem List CAD -LHC 2011 60% LAD, 40% OM1 -negative MPI 02/01/2017 2.  DM -A1c 7.9 3. HTN 4. HLD -T chol 140, HDL 31, LDL 82, TG 155 5. OSA  Past Medical History: Past Medical History:  Diagnosis Date   Coronary artery disease    Diabetes mellitus without complication ([place])    Hyperlipidemia    Hypertension    Hypogonadism in male    OSA (obstructive sleep apnea)    Thyroid disease     Past Surgical History: Past Surgical History:  Procedure Laterality Date   APPENDECTOMY     APPENDECTOMY Right    CARDIAC CATHETERIZATION      Current Medications: Current Meds  Medication Sig   amLODipine (NORVASC) 10 MG tablet Take 10 mg by mouth daily.   aspirin 81 MG tablet Take 81 mg by mouth daily.   glimepiride (AMARYL) 4 MG tablet Take 4 mg by mouth every morning.   glucose blood test strip 1 each by Other route as needed for other. Use as instructed   levothyroxine (SYNTHROID, LEVOTHROID) 125 MCG tablet Take 125 mcg by mouth daily before breakfast.    metFORMIN (GLUCOPHAGE) 500 MG tablet Take 1,000 mg by mouth daily.   metoprolol tartrate (LOPRESSOR) 100 MG tablet Take 1 tablet by mouth once for procedure.   telmisartan (MICARDIS) 40 MG tablet Take 40 mg by mouth  daily.   [DISCONTINUED] atorvastatin (LIPITOR) 20 MG tablet Take 0.5 tablets (10 mg total) by mouth daily.   [DISCONTINUED] JARDIANCE 25 MG TABS tablet Take 25 mg by mouth daily.     Allergies:    Niaspan [niacin er]   Social History: Social History   Socioeconomic History   Marital status: Married    Spouse name: Not on file   Number of children: 2   Years of education: Not on file   Highest education level: Not on file  Occupational History   Occupation: Retired Works with Horses  Tobacco Use   Smoking status: Former   Smokeless tobacco: Former    Types: Chew  Substance and Sexual Activity   Alcohol use: No   Drug use: No   Sexual activity: Not on file  Other Topics Concern   Not on file  Social History Narrative   Not on file   Social Determinants of Health    Financial Resource Strain: Not on file  Food Insecurity: Not on file  Transportation Needs: Not on file  Physical Activity: Not on file  Stress: Not on file  Social Connections: Not on file     Family History: The patient's family history includes Alzheimer's disease in his father; Heart disease in his mother.  ROS:   All other ROS reviewed and negative. Pertinent positives noted in the HPI.     EKGs/Labs/Other Studies Reviewed:   The following studies were personally reviewed by me today:  EKG:  EKG is ordered today.  The ekg ordered today demonstrates NSR 78 bpm, nonspecific ST-T changes, and was personally reviewed by me.   Recent Labs: 07/27/2021: BUN 10; Creatinine, Ser 1.22; Hemoglobin 12.9; Platelets 269; Potassium 3.8; Sodium 135   Recent Lipid Panel No results found for: CHOL, TRIG, HDL, CHOLHDL, VLDL, LDLCALC, LDLDIRECT  Physical Exam:   VS:  BP 120/80 (BP Location: Right Arm)   Pulse 78   Ht 6' (1.829 m)   Wt 246 lb 6.4 oz (111.8 kg)   SpO2 95%   BMI 33.42 kg/m    Wt Readings from Last 3 Encounters:  07/30/21 246 lb 6.4 oz (111.8 kg)  02/01/17 243 lb (110.2 kg)  01/27/17 243 lb 12.8 oz (110.6 kg)    General: Well nourished, well developed, in no acute distress Head: Atraumatic, normal size  Eyes: PEERLA, EOMI  Neck: Supple, no JVD Endocrine: No thryomegaly Cardiac: Normal S1, S2; RRR; no murmurs, rubs, or gallops Lungs: Clear to auscultation bilaterally, no wheezing, rhonchi or rales  Abd: Soft, nontender, no hepatomegaly  Ext: No edema, pulses 2+ Musculoskeletal: No deformities, BUE and BLE strength normal and equal Skin: Warm and dry, no rashes   Neuro: Alert and oriented to person, place, time, and situation, CNII-XII grossly intact, no focal deficits  Psych: Normal mood and affect   ASSESSMENT:   [name] is a 65 y.o. male who presents for the following: 1. Chest pain, unspecified type   2. Mixed hyperlipidemia   3. OSA (obstructive  sleep apnea)     PLAN:   1. Chest pain, unspecified type 2. Mixed hyperlipidemia -Possibly cardiac chest pain.  Seen in the ER and had negative troponins.  EKG demonstrated nonspecific ST-T changes.  EKG today demonstrates nonspecific ST-T changes.  He was awoken from sleep.  He has untreated sleep apnea.  I suspect his symptoms could be from sleep apnea.  His risk factors for CAD include a history of nonobstructive CAD on heart cath in  2011.  He is also diabetic.  He had a normal stress test in 2018 but things could have changed.  For further evaluation I recommended a coronary CTA.  He will take 100 mg of metoprolol tartrate charger for the scan.  His blood pressure is well controlled today in office.  I would like for him to get an echocardiogram as well.  His activity is not restricted.  His chest pain is not exertional and alleviated by rest.  This occurred at night and being awoken from sleep.  I really suspect this will be sleep apnea related but we will evaluate him with a coronary CTA and echocardiogram.  He is diabetic.  LDL goal less than 70.  Increase his Lipitor to 20 mg daily.  We will see him back in 4 months to discuss further.  3. OSA (obstructive sleep apnea) -Untreated sleep apnea.  Chest pain at night.  Suspect this is related to that.  Referral to [facility] for further evaluation.  He apparently has sleep apnea but cannot wear the mask he has.  Likely needs further titration.  Disposition: Return in about 4 months (around 11/29/2021).  Medication Adjustments/Labs and Tests Ordered: Current medicines are reviewed at length with the patient today.  Concerns regarding medicines are outlined above.  Orders Placed This Encounter  Procedures   CT CORONARY MORPH W/CTA COR W/SCORE W/CA W/CM &/OR WO/CM   Ambulatory referral to Sleep Studies   EKG 12-Lead   ECHOCARDIOGRAM COMPLETE    Meds ordered this encounter  Medications   atorvastatin (LIPITOR) 20 MG tablet    Sig: Take 1  tablet (20 mg total) by mouth daily.    Dispense:  90 tablet    Refill:  3   metoprolol tartrate (LOPRESSOR) 100 MG tablet    Sig: Take 1 tablet by mouth once for procedure.    Dispense:  1 tablet    Refill:  0     Patient Instructions  Medication Instructions:  Increase Lipitor to 20 mg daily  Take Metoprolol Tartrate 100 mg two hours before the CT.   *If you need a refill on your cardiac medications before your next appointment, please call your pharmacy*   Testing/Procedures: Echocardiogram - Your physician has requested that you have an echocardiogram. Echocardiography is a painless test that uses sound waves to create images of your heart. It provides your doctor with information about the size and shape of your heart and how well your heart's chambers and valves are working. This procedure takes approximately one hour. There are no restrictions for this procedure. This will be performed at our [facility] location - [address].  Your physician has requested that you have cardiac CT. Cardiac computed tomography (CT) is a painless test that uses an x-ray machine to take clear, detailed pictures of your heart. For further information please visit [email or web address]. Please follow instruction sheet as given.    Follow-Up: At [facility], you and your health needs are our priority.  As part of our continuing mission to provide you with exceptional heart care, we have created designated Provider Care Teams.  These Care Teams include your primary Cardiologist (physician) and Advanced Practice Providers (APPs -  Physician Assistants and Nurse Practitioners) who all work together to provide you with the care you need, when you need it.  We recommend signing up for the patient portal called "MyChart".  Sign up information is provided on this After Visit Summary.  MyChart is  used to connect with patients for Virtual Visits (Telemedicine).  Patients are able to view lab/test  results, encounter notes, upcoming appointments, etc.  Non-urgent messages can be sent to your provider as well.   To learn more about what you can do with MyChart, go to [email or web address].    Your next appointment:   4 month(s)  The format for your next appointment:   In Person  Provider:   Eleonore Chiquito, MD   Other Instructions Referral to Fransico Him, MD for CPAP titration     Your cardiac CT will be scheduled at one of the below locations:   [facility] [address] [place], [place] [address] [phone]  If scheduled at [facility], please arrive at the [facility] main entrance (entrance A) of [facility] 30 minutes prior to test start time. Proceed to the [facility] Radiology Department (first floor) to check-in and test prep.  Please follow these instructions carefully (unless otherwise directed):  Hold all erectile dysfunction medications at least 3 days (72 hrs) prior to test.  On the Night Before the Test: Be sure to Drink plenty of water. Do not consume any caffeinated/decaffeinated beverages or chocolate 12 hours prior to your test. Do not take any antihistamines 12 hours prior to your test.  On the Day of the Test: Drink plenty of water until 1 hour prior to the test. Do not eat any food 4 hours prior to the test. You may take your regular medications prior to the test.  Take metoprolol (Lopressor) two hours prior to test. HOLD Furosemide/Hydrochlorothiazide morning of the test.      After the Test: Drink plenty of water. After receiving IV contrast, you may experience a mild flushed feeling. This is normal. On occasion, you may experience a mild rash up to 24 hours after the test. This is not dangerous. If this occurs, you can take Benadryl 25 mg and increase your fluid intake. If you experience trouble breathing, this can be serious. If it is severe call 911 IMMEDIATELY. If it is mild, please call our office. If  you take any of these medications: Glipizide/Metformin, Avandament, Glucavance, please do not take 48 hours after completing test unless otherwise instructed.  Please allow 2-4 weeks for scheduling of routine cardiac CTs. Some insurance companies require a pre-authorization which may delay scheduling of this test.   For non-scheduling related questions, please contact the cardiac imaging nurse navigator should you have any questions/concerns: Marchia Bond, Cardiac Imaging Nurse Navigator Gordy Clement, Cardiac Imaging Nurse Navigator [facility] Direct Office Dial: [phone]   For scheduling needs, including cancellations and rescheduling, please call [place], [phone].     Signed, Addison Naegeli. Audie Box, MD, [place]  [address], [place], [place] [address] [phone]  07/30/2021 9:51 AM

## 2021-07-30 ENCOUNTER — Encounter: Payer: Self-pay | Admitting: Cardiovascular Disease

## 2021-07-30 ENCOUNTER — Ambulatory Visit: Payer: Medicare PPO | Admitting: Cardiovascular Disease

## 2021-07-30 ENCOUNTER — Other Ambulatory Visit: Payer: Self-pay

## 2021-07-30 VITALS — BP 120/80 | HR 78 | Ht 72.0 in | Wt 246.4 lb

## 2021-07-30 DIAGNOSIS — E782 Mixed hyperlipidemia: Secondary | ICD-10-CM

## 2021-07-30 DIAGNOSIS — R079 Chest pain, unspecified: Secondary | ICD-10-CM

## 2021-07-30 DIAGNOSIS — G4733 Obstructive sleep apnea (adult) (pediatric): Secondary | ICD-10-CM

## 2021-07-30 MED ORDER — ATORVASTATIN CALCIUM 20 MG PO TABS: 20.0000 mg | ORAL_TABLET | Freq: Every day | ORAL | 3 refills | Status: DC

## 2021-07-30 MED ORDER — METOPROLOL TARTRATE 100 MG PO TABS: ORAL_TABLET | ORAL | 0 refills | Status: DC

## 2021-07-30 NOTE — Patient Instructions (Signed)
Medication Instructions:  Increase Lipitor to 20 mg daily  Take Metoprolol Tartrate 100 mg two hours before the CT.   *If you need a refill on your cardiac medications before your next appointment, please call your pharmacy*   Testing/Procedures: Echocardiogram - Your physician has requested that you have an echocardiogram. Echocardiography is a painless test that uses sound waves to create images of your heart. It provides your doctor with information about the size and shape of your heart and how well your heart's chambers and valves are working. This procedure takes approximately one hour. There are no restrictions for this procedure. This will be performed at our [facility] location - [address], [address].  Your physician has requested that you have cardiac CT. Cardiac computed tomography (CT) is a painless test that uses an x-ray machine to take clear, detailed pictures of your heart. For further information please visit [email or web address]. Please follow instruction sheet as given.    Follow-Up: At [facility], you and your health needs are our priority.  As part of our continuing mission to provide you with exceptional heart care, we have created designated Provider Care Teams.  These Care Teams include your primary Cardiologist (physician) and Advanced Practice Providers (APPs -  Physician Assistants and Nurse Practitioners) who all work together to provide you with the care you need, when you need it.  We recommend signing up for the patient portal called "MyChart".  Sign up information is provided on this After Visit Summary.  MyChart is used to connect with patients for Virtual Visits (Telemedicine).  Patients are able to view lab/test results, encounter notes, upcoming appointments, etc.  Non-urgent messages can be sent to your provider as well.   To learn more about what you can do with MyChart, go to [email or web address].    Your next appointment:   4 month(s)  The  format for your next appointment:   In Person  Provider:   Eleonore Chiquito, MD   Other Instructions Referral to Fransico Him, MD for CPAP titration     Your cardiac CT will be scheduled at one of the below locations:   [facility] [address] [place], [place] [address] [phone]  If scheduled at [facility], please arrive at the [facility] main entrance (entrance A) of [facility] 30 minutes prior to test start time. Proceed to the [facility] Radiology Department (first floor) to check-in and test prep.  Please follow these instructions carefully (unless otherwise directed):  Hold all erectile dysfunction medications at least 3 days (72 hrs) prior to test.  On the Night Before the Test: Be sure to Drink plenty of water. Do not consume any caffeinated/decaffeinated beverages or chocolate 12 hours prior to your test. Do not take any antihistamines 12 hours prior to your test.  On the Day of the Test: Drink plenty of water until 1 hour prior to the test. Do not eat any food 4 hours prior to the test. You may take your regular medications prior to the test.  Take metoprolol (Lopressor) two hours prior to test. HOLD Furosemide/Hydrochlorothiazide morning of the test.      After the Test: Drink plenty of water. After receiving IV contrast, you may experience a mild flushed feeling. This is normal. On occasion, you may experience a mild rash up to 24 hours after the test. This is not dangerous. If this occurs, you can take Benadryl 25 mg and increase your fluid intake. If  you experience trouble breathing, this can be serious. If it is severe call 911 IMMEDIATELY. If it is mild, please call our office. If you take any of these medications: Glipizide/Metformin, Avandament, Glucavance, please do not take 48 hours after completing test unless otherwise instructed.  Please allow 2-4 weeks for scheduling of routine cardiac CTs. Some insurance  companies require a pre-authorization which may delay scheduling of this test.   For non-scheduling related questions, please contact the cardiac imaging nurse navigator should you have any questions/concerns: Marchia Bond, Cardiac Imaging Nurse Navigator Gordy Clement, Cardiac Imaging Nurse Navigator  [facility] Direct Office Dial: [phone]   For scheduling needs, including cancellations and rescheduling, please call [place], [phone].

## 2021-08-04 ENCOUNTER — Telehealth (HOSPITAL_COMMUNITY): Payer: Self-pay | Admitting: *Deleted

## 2021-08-04 NOTE — Telephone Encounter (Signed)
Reaching out to patient to offer assistance regarding upcoming cardiac imaging study; pt verbalizes understanding of appt date/time, parking situation and where to check in, pre-test NPO status and medications ordered, and verified current allergies; name and call back number provided for further questions should they arise ° °Kimba Lottes RN Navigator Cardiac Imaging °[facility] °[phone] office °[phone] cell  ° °Patient to take 100mg metoprolol tartrate two hours prior to cardiac CT scan. °

## 2021-08-06 ENCOUNTER — Other Ambulatory Visit: Payer: Self-pay

## 2021-08-06 ENCOUNTER — Other Ambulatory Visit: Payer: Self-pay | Admitting: Cardiovascular Disease

## 2021-08-06 ENCOUNTER — Ambulatory Visit (HOSPITAL_COMMUNITY)
Admission: RE | Admit: 2021-08-06 | Discharge: 2021-08-06 | Disposition: A | Discharge status: Home / Self Care | Payer: Medicare PPO | Source: Ambulatory Visit | Attending: Cardiovascular Disease | Admitting: Cardiovascular Disease

## 2021-08-06 DIAGNOSIS — I2510 Atherosclerotic heart disease of native coronary artery without angina pectoris: Secondary | ICD-10-CM | POA: Diagnosis not present

## 2021-08-06 DIAGNOSIS — I700 Atherosclerosis of aorta: Secondary | ICD-10-CM | POA: Insufficient documentation

## 2021-08-06 DIAGNOSIS — R931 Abnormal findings on diagnostic imaging of heart and coronary circulation: Secondary | ICD-10-CM | POA: Diagnosis not present

## 2021-08-06 DIAGNOSIS — I25119 Atherosclerotic heart disease of native coronary artery with unspecified angina pectoris: Secondary | ICD-10-CM | POA: Insufficient documentation

## 2021-08-06 DIAGNOSIS — I209 Angina pectoris, unspecified: Secondary | ICD-10-CM | POA: Diagnosis present

## 2021-08-06 DIAGNOSIS — R079 Chest pain, unspecified: Secondary | ICD-10-CM | POA: Insufficient documentation

## 2021-08-06 MED ORDER — IOHEXOL 350 MG/ML SOLN
95.0000 mL | Freq: Once | INTRAVENOUS | Status: AC | PRN
  Administered 2021-08-06: 95 mL via INTRAVENOUS

## 2021-08-06 MED ORDER — NITROGLYCERIN 0.4 MG SL SUBL
SUBLINGUAL_TABLET | SUBLINGUAL | Status: AC
  Administered 2021-08-06: 0.8 mg via SUBLINGUAL
  Filled 2021-08-06: qty 2

## 2021-08-06 MED ORDER — NITROGLYCERIN 0.4 MG SL SUBL: 0.8000 mg | SUBLINGUAL_TABLET | Freq: Once | SUBLINGUAL | Status: AC

## 2021-08-07 ENCOUNTER — Telehealth: Payer: Self-pay | Admitting: Cardiovascular Disease

## 2021-08-07 DIAGNOSIS — I2510 Atherosclerotic heart disease of native coronary artery without angina pectoris: Secondary | ICD-10-CM | POA: Diagnosis not present

## 2021-08-07 MED ORDER — NITROGLYCERIN 0.4 MG SL SUBL: 0.4000 mg | SUBLINGUAL_TABLET | SUBLINGUAL | 5 refills | Status: AC | PRN

## 2021-08-07 NOTE — Telephone Encounter (Signed)
Patient has multivessel disease on coronary CT and FFR.  Dr. Audie Box is on vacation.  I called and spoke to the patient.  He isn't having chest pain at this time.  Will send a prescription for SL nitroglycerin.  He was instructed on how to use it.  If he has chest pain that does not go away he will need to go to the hospital. He is agreeable to having a LHC next week.   Risks and benefits of cardiac catheterization have been discussed with the patient.  The patient understands that risks included but are not limited to stroke (1 in 1000), death (1 in 3), kidney failure [usually temporary] (1 in 500), bleeding (1 in 200), allergic reaction [possibly serious] (1 in 200). The patient understands and agrees to proceed.   Shaydon Lease C. Oval Linsey, MD, [facility]  08/07/2021 11:43 AM

## 2021-08-07 NOTE — Telephone Encounter (Signed)
Spoke with patient and scheduled cath for 8/30 arrive at 5:30 am for 7:30 am procedure with Dr Claiborne Billings Reviewed instructions and to hold Metformin and Glimepiride morning of procedure  Advised patient if he had any questions about instructions to call back Monday, verbalized understanding

## 2021-08-07 NOTE — Addendum Note (Signed)
Addended by: Alvina Filbert B on: 08/07/2021 03:14 PM   Modules accepted: Orders

## 2021-08-07 NOTE — Telephone Encounter (Addendum)
Left message to call back  Rx for NTG sent to [facility]

## 2021-08-10 ENCOUNTER — Telehealth: Payer: Self-pay | Admitting: *Deleted

## 2021-08-10 NOTE — Telephone Encounter (Signed)
Cardiac catheterization scheduled at [facility] for: Tuesday August 11, 2021 7:30 [facility] Main Entrance A [facility]) at: 5:30 AM   No solid food after midnight prior to cath, clear liquids until 5 AM day of procedure.  Medication instructions: Hold: -Metformin-day of procedure and 48 hours post procedure  -Glimepiride-AM of procedure  Except hold medications morning medications can be taken pre-cath with sips of water including aspirin 81 mg.    Confirmed patient has responsible adult to drive home post procedure and be with patient first 24 hours after arriving home.  Patients are allowed one visitor in the waiting room during the time they are at the hospital for their procedure. Both patient and visitor must wear a mask once they enter the hospital.   Patient reports does not currently have any symptoms concerning for COVID-19 and no household members with COVID-19 like illness.   Reviewed procedure/mask/visitor instructions with patient.

## 2021-08-11 ENCOUNTER — Encounter (HOSPITAL_COMMUNITY): Payer: Self-pay | Admitting: Interventional Cardiology

## 2021-08-11 ENCOUNTER — Other Ambulatory Visit: Payer: Self-pay

## 2021-08-11 ENCOUNTER — Other Ambulatory Visit (HOSPITAL_COMMUNITY): Payer: Self-pay

## 2021-08-11 ENCOUNTER — Ambulatory Visit (HOSPITAL_COMMUNITY)
Admission: RE | Admit: 2021-08-11 | Discharge: 2021-08-11 | Disposition: A | Discharge status: Home / Self Care | Payer: Medicare PPO | Attending: Cardiovascular Disease | Admitting: Cardiovascular Disease

## 2021-08-11 ENCOUNTER — Encounter (HOSPITAL_COMMUNITY)
Admission: RE | Disposition: A | Discharge status: Home / Self Care | Payer: Self-pay | Source: Home / Self Care | Attending: Cardiovascular Disease

## 2021-08-11 DIAGNOSIS — Z888 Allergy status to other drugs, medicaments and biological substances status: Secondary | ICD-10-CM | POA: Diagnosis not present

## 2021-08-11 DIAGNOSIS — Z87891 Personal history of nicotine dependence: Secondary | ICD-10-CM | POA: Insufficient documentation

## 2021-08-11 DIAGNOSIS — E785 Hyperlipidemia, unspecified: Secondary | ICD-10-CM | POA: Diagnosis present

## 2021-08-11 DIAGNOSIS — Z9049 Acquired absence of other specified parts of digestive tract: Secondary | ICD-10-CM | POA: Insufficient documentation

## 2021-08-11 DIAGNOSIS — E079 Disorder of thyroid, unspecified: Secondary | ICD-10-CM | POA: Diagnosis not present

## 2021-08-11 DIAGNOSIS — I2584 Coronary atherosclerosis due to calcified coronary lesion: Secondary | ICD-10-CM | POA: Insufficient documentation

## 2021-08-11 DIAGNOSIS — E782 Mixed hyperlipidemia: Secondary | ICD-10-CM | POA: Insufficient documentation

## 2021-08-11 DIAGNOSIS — Z955 Presence of coronary angioplasty implant and graft: Secondary | ICD-10-CM | POA: Diagnosis not present

## 2021-08-11 DIAGNOSIS — Z7902 Long term (current) use of antithrombotics/antiplatelets: Secondary | ICD-10-CM | POA: Insufficient documentation

## 2021-08-11 DIAGNOSIS — G4733 Obstructive sleep apnea (adult) (pediatric): Secondary | ICD-10-CM | POA: Insufficient documentation

## 2021-08-11 DIAGNOSIS — Z7982 Long term (current) use of aspirin: Secondary | ICD-10-CM | POA: Diagnosis not present

## 2021-08-11 DIAGNOSIS — I10 Essential (primary) hypertension: Secondary | ICD-10-CM | POA: Insufficient documentation

## 2021-08-11 DIAGNOSIS — Z79890 Hormone replacement therapy (postmenopausal): Secondary | ICD-10-CM | POA: Insufficient documentation

## 2021-08-11 DIAGNOSIS — Z7984 Long term (current) use of oral hypoglycemic drugs: Secondary | ICD-10-CM | POA: Diagnosis not present

## 2021-08-11 DIAGNOSIS — E119 Type 2 diabetes mellitus without complications: Secondary | ICD-10-CM | POA: Insufficient documentation

## 2021-08-11 DIAGNOSIS — Z95820 Peripheral vascular angioplasty status with implants and grafts: Secondary | ICD-10-CM

## 2021-08-11 DIAGNOSIS — R079 Chest pain, unspecified: Secondary | ICD-10-CM

## 2021-08-11 DIAGNOSIS — Z79899 Other long term (current) drug therapy: Secondary | ICD-10-CM | POA: Diagnosis not present

## 2021-08-11 DIAGNOSIS — Z8249 Family history of ischemic heart disease and other diseases of the circulatory system: Secondary | ICD-10-CM | POA: Insufficient documentation

## 2021-08-11 DIAGNOSIS — I25118 Atherosclerotic heart disease of native coronary artery with other forms of angina pectoris: Secondary | ICD-10-CM | POA: Insufficient documentation

## 2021-08-11 HISTORY — PX: LEFT HEART CATH AND CORONARY ANGIOGRAPHY: CATH118249

## 2021-08-11 HISTORY — PX: CORONARY STENT INTERVENTION: CATH118234

## 2021-08-11 HISTORY — PX: CORONARY ULTRASOUND/IVUS: CATH118244

## 2021-08-11 LAB — POCT ACTIVATED CLOTTING TIME
Activated Clotting Time: 207 seconds
Activated Clotting Time: 248 seconds
Activated Clotting Time: 341 seconds

## 2021-08-11 LAB — GLUCOSE, CAPILLARY: Glucose-Capillary: 188 mg/dL — ABNORMAL HIGH (ref 70–99)

## 2021-08-11 SURGERY — LEFT HEART CATH AND CORONARY ANGIOGRAPHY
Anesthesia: LOCAL

## 2021-08-11 MED ORDER — SODIUM CHLORIDE 0.9% FLUSH: 3.0000 mL | Freq: Two times a day (BID) | INTRAVENOUS | Status: DC

## 2021-08-11 MED ORDER — CLOPIDOGREL BISULFATE 300 MG PO TABS
ORAL_TABLET | ORAL | Status: DC | PRN
  Administered 2021-08-11: 600 mg via ORAL

## 2021-08-11 MED ORDER — VERAPAMIL HCL 2.5 MG/ML IV SOLN
INTRAVENOUS | Status: DC | PRN
  Administered 2021-08-11: 5 mL via INTRA_ARTERIAL
  Administered 2021-08-11: 10 mL via INTRA_ARTERIAL

## 2021-08-11 MED ORDER — ATORVASTATIN CALCIUM 80 MG PO TABS: 80.0000 mg | ORAL_TABLET | Freq: Every day | ORAL | Status: DC

## 2021-08-11 MED ORDER — HEPARIN SODIUM (PORCINE) 1000 UNIT/ML IJ SOLN
INTRAMUSCULAR | Status: AC
  Filled 2021-08-11: qty 1

## 2021-08-11 MED ORDER — SODIUM CHLORIDE 0.9 % IV SOLN: 250.0000 mL | INTRAVENOUS | Status: DC | PRN

## 2021-08-11 MED ORDER — NITROGLYCERIN 0.4 MG SL SUBL: 0.4000 mg | SUBLINGUAL_TABLET | SUBLINGUAL | Status: DC | PRN

## 2021-08-11 MED ORDER — SODIUM CHLORIDE 0.9% FLUSH: 3.0000 mL | INTRAVENOUS | Status: DC | PRN

## 2021-08-11 MED ORDER — ACETAMINOPHEN 325 MG PO TABS: 650.0000 mg | ORAL_TABLET | ORAL | Status: DC | PRN

## 2021-08-11 MED ORDER — LEVOTHYROXINE SODIUM 125 MCG PO TABS: 125.0000 ug | ORAL_TABLET | Freq: Every day | ORAL | Status: DC

## 2021-08-11 MED ORDER — SODIUM CHLORIDE 0.9 % IV SOLN: INTRAVENOUS | Status: DC

## 2021-08-11 MED ORDER — MIDAZOLAM HCL 2 MG/2ML IJ SOLN
INTRAMUSCULAR | Status: DC | PRN
  Administered 2021-08-11: 2 mg via INTRAVENOUS
  Administered 2021-08-11: 1 mg via INTRAVENOUS

## 2021-08-11 MED ORDER — ASPIRIN 81 MG PO CHEW: 81.0000 mg | CHEWABLE_TABLET | Freq: Every day | ORAL | Status: DC

## 2021-08-11 MED ORDER — FENTANYL CITRATE (PF) 100 MCG/2ML IJ SOLN
INTRAMUSCULAR | Status: DC | PRN
  Administered 2021-08-11 (×2): 25 ug via INTRAVENOUS

## 2021-08-11 MED ORDER — VERAPAMIL HCL 2.5 MG/ML IV SOLN
INTRAVENOUS | Status: AC
  Filled 2021-08-11: qty 2

## 2021-08-11 MED ORDER — SODIUM CHLORIDE 0.9 % WEIGHT BASED INFUSION
3.0000 mL/kg/h | INTRAVENOUS | Status: AC
  Administered 2021-08-11: 3 mL/kg/h via INTRAVENOUS

## 2021-08-11 MED ORDER — NITROGLYCERIN 1 MG/10 ML FOR IR/CATH LAB
INTRA_ARTERIAL | Status: DC | PRN
  Administered 2021-08-11 (×2): 200 ug via INTRACORONARY

## 2021-08-11 MED ORDER — AMLODIPINE BESYLATE 10 MG PO TABS
10.0000 mg | ORAL_TABLET | Freq: Every day | ORAL | Status: DC
  Administered 2021-08-11: 10 mg via ORAL
  Filled 2021-08-11: qty 1

## 2021-08-11 MED ORDER — FENTANYL CITRATE (PF) 100 MCG/2ML IJ SOLN
INTRAMUSCULAR | Status: AC
  Filled 2021-08-11: qty 2

## 2021-08-11 MED ORDER — SODIUM CHLORIDE 0.9 % WEIGHT BASED INFUSION: 1.0000 mL/kg/h | INTRAVENOUS | Status: DC

## 2021-08-11 MED ORDER — MIDAZOLAM HCL 2 MG/2ML IJ SOLN
INTRAMUSCULAR | Status: AC
  Filled 2021-08-11: qty 2

## 2021-08-11 MED ORDER — HEPARIN SODIUM (PORCINE) 1000 UNIT/ML IJ SOLN
INTRAMUSCULAR | Status: DC | PRN
  Administered 2021-08-11: 7000 [IU] via INTRAVENOUS
  Administered 2021-08-11: 4000 [IU] via INTRAVENOUS
  Administered 2021-08-11: 5000 [IU] via INTRAVENOUS
  Administered 2021-08-11: 3000 [IU] via INTRAVENOUS

## 2021-08-11 MED ORDER — ASPIRIN 81 MG PO TABS: 81.0000 mg | ORAL_TABLET | Freq: Every day | ORAL | Status: DC

## 2021-08-11 MED ORDER — ONDANSETRON HCL 4 MG/2ML IJ SOLN: 4.0000 mg | Freq: Four times a day (QID) | INTRAMUSCULAR | Status: DC | PRN

## 2021-08-11 MED ORDER — CLOPIDOGREL BISULFATE 300 MG PO TABS
ORAL_TABLET | ORAL | Status: AC
  Filled 2021-08-11: qty 2

## 2021-08-11 MED ORDER — IRBESARTAN 150 MG PO TABS: 150.0000 mg | ORAL_TABLET | Freq: Every day | ORAL | Status: DC

## 2021-08-11 MED ORDER — CLOPIDOGREL BISULFATE 75 MG PO TABS
75.0000 mg | ORAL_TABLET | Freq: Every day | ORAL | 2 refills | Status: DC
  Filled 2021-08-11: qty 90, 90d supply, fill #0

## 2021-08-11 MED ORDER — HEPARIN (PORCINE) IN NACL 1000-0.9 UT/500ML-% IV SOLN
INTRAVENOUS | Status: DC | PRN
  Administered 2021-08-11 (×2): 500 mL

## 2021-08-11 MED ORDER — LIDOCAINE HCL (PF) 1 % IJ SOLN
INTRAMUSCULAR | Status: AC
  Filled 2021-08-11: qty 30

## 2021-08-11 MED ORDER — ATORVASTATIN CALCIUM 80 MG PO TABS
80.0000 mg | ORAL_TABLET | Freq: Every day | ORAL | 0 refills | Status: AC
  Filled 2021-08-11: qty 90, 90d supply, fill #0

## 2021-08-11 MED ORDER — LIDOCAINE HCL (PF) 1 % IJ SOLN
INTRAMUSCULAR | Status: DC | PRN
  Administered 2021-08-11: 2 mL

## 2021-08-11 MED ORDER — CLOPIDOGREL BISULFATE 75 MG PO TABS: 75.0000 mg | ORAL_TABLET | Freq: Every day | ORAL | Status: DC

## 2021-08-11 MED ORDER — HEPARIN (PORCINE) IN NACL 1000-0.9 UT/500ML-% IV SOLN
INTRAVENOUS | Status: AC
  Filled 2021-08-11: qty 1000

## 2021-08-11 MED ORDER — ASPIRIN 81 MG PO CHEW: 81.0000 mg | CHEWABLE_TABLET | ORAL | Status: AC

## 2021-08-11 MED ORDER — GLIMEPIRIDE 4 MG PO TABS: 4.0000 mg | ORAL_TABLET | Freq: Every morning | ORAL | Status: DC

## 2021-08-11 MED ORDER — HYDRALAZINE HCL 20 MG/ML IJ SOLN: 10.0000 mg | INTRAMUSCULAR | Status: DC | PRN

## 2021-08-11 MED ORDER — IOHEXOL 350 MG/ML SOLN
INTRAVENOUS | Status: DC | PRN
  Administered 2021-08-11: 135 mL

## 2021-08-11 MED ORDER — LABETALOL HCL 5 MG/ML IV SOLN: 10.0000 mg | INTRAVENOUS | Status: DC | PRN

## 2021-08-11 MED ORDER — NITROGLYCERIN 1 MG/10 ML FOR IR/CATH LAB
INTRA_ARTERIAL | Status: AC
  Filled 2021-08-11: qty 10

## 2021-08-11 SURGICAL SUPPLY — 24 items
BALLN SAPPHIRE 2.5X15 (BALLOONS) ×2
BALLN ~~LOC~~ EUPHORA RX 3.75X12 (BALLOONS) ×2
BALLOON SAPPHIRE 2.5X15 (BALLOONS) ×1 IMPLANT
BALLOON ~~LOC~~ EUPHORA RX 3.75X12 (BALLOONS) ×1 IMPLANT
CATH 5FR JL3.5 JR4 ANG PIG MP (CATHETERS) ×2 IMPLANT
CATH INFINITI 5FR AL1 (CATHETERS) ×2 IMPLANT
CATH OPTICROSS HD (CATHETERS) ×2 IMPLANT
CATH VISTA GUIDE 6FR AL1 (CATHETERS) ×2 IMPLANT
DEVICE RAD COMP TR BAND LRG (VASCULAR PRODUCTS) ×2 IMPLANT
GLIDESHEATH SLEND SS 6F .021 (SHEATH) ×2 IMPLANT
GUIDEWIRE INQWIRE 1.5J.035X260 (WIRE) ×1 IMPLANT
INQWIRE 1.5J .035X260CM (WIRE) ×2
KIT ENCORE 26 ADVANTAGE (KITS) ×2 IMPLANT
KIT HEART LEFT (KITS) ×2 IMPLANT
KIT HEMO VALVE WATCHDOG (MISCELLANEOUS) ×2 IMPLANT
PACK CARDIAC CATHETERIZATION (CUSTOM PROCEDURE TRAY) ×2 IMPLANT
SHEATH 6FR 85 DEST SLENDER (SHEATH) ×2 IMPLANT
SLED PULL BACK IVUS (MISCELLANEOUS) ×2 IMPLANT
STENT ONYX FRONTIER 3.5X15 (Permanent Stent) ×2 IMPLANT
STENT ONYX FRONTIER 3.5X18 (Permanent Stent) ×2 IMPLANT
TRANSDUCER W/STOPCOCK (MISCELLANEOUS) ×2 IMPLANT
TUBING CIL FLEX 10 FLL-RA (TUBING) ×2 IMPLANT
WIRE ASAHI FIELDER XT 190CM (WIRE) ×2 IMPLANT
WIRE ASAHI PROWATER 180CM (WIRE) ×2 IMPLANT

## 2021-08-11 NOTE — Discharge Instructions (Addendum)
Information about your medication: Plavix (anti-platelet agent)  Generic Name (Brand): clopidogrel (Plavix), once daily medication  PURPOSE: You are taking this medication along with aspirin to lower your chance of having a heart attack, stroke, or blood clots in your heart stent. These can be fatal. Plavix and aspirin help prevent platelets from sticking together and forming a clot that can block an artery or your stent.   Common SIDE EFFECTS you may experience include: bruising or bleeding more easily, shortness of breath  Do not stop taking PLAVIX without talking to the doctor who prescribes it for you. People who are treated with a stent and stop taking Plavix too soon, have a higher risk of getting a blood clot in the stent, having a heart attack, or dying. If you stop Plavix because of bleeding, or for other reasons, your risk of a heart attack or stroke may increase.   Tell all of your doctors and dentists that you are taking Plavix. They should talk to the doctor who prescribed Plavix for you before you have any surgery or invasive procedure.   Contact your health care provider if you experience: severe or uncontrollable bleeding, pink/red/brown urine, vomiting blood or vomit that looks like "coffee grounds", red or black stools (looks like tar), coughing up blood or blood clots ----------------------------------------------------------------------------------------------------------------------  

## 2021-08-11 NOTE — Progress Notes (Addendum)
Client and his wife notified client can't take metformin until Friday and client and his wife voiced understanding; Lindsay,NP in to see client at 1430; tr band off and small dime size hematoma noted; pressure held x 15 min and right radial site soft

## 2021-08-11 NOTE — Research (Signed)
Reveal Plaque Research Study  Reveal Plaque Informed Consent   Subject Name: Zachary Bowers  Subject met inclusion and exclusion criteria.  The informed consent form, study requirements and expectations were reviewed with the subject and questions and concerns were addressed prior to the signing of the consent form.  The subject verbalized understanding of the trial requirements.  The subject agreed to participate in the Reveal Plaque trial and signed the informed consent at 1300 on 08/11/2021.  The informed consent was obtained prior to performance of any protocol-specific procedures for the subject.  A copy of the signed informed consent was given to the subject and a copy was placed in the subject's medical record.   Zachary Bowers  Inclusion: [x]  1. Age >54 [x]  2. Clinically stable patient with known CAD. [x]   3. CCTA showing stenosis in at least one major epicardial vessel of stentable/graftable diameter, in whom clinically indicated IVUS is planned within 45 days of the CCTA  and FFR CT available. [x]  4. FFR CT successfully processed. [x]  5. Willing to comply with protocol. [x]  6. Agrees to be included in the study and able to provide written informed consent.  Exclusion: []  1. CCTA showing no stenosis. []  2. Uninterpretable CCTA by HeartFlow assessment, in which image quality prevents FFR CT from being processed. []  3. Acute chest pain. []  4. CABG prior to CCTA acquisition. []  5. Prior history of PCI for 3 or more vessels. []  6. MI less than 30 days prior to CCTA or between CCTA and ICA. []  7. Suspicion of acute coronary syndrome, Acute MI or Unstable Angina. []  8. Known complex congenital heart disease. []  9. Tachycardia or significant arrythmia. []  10. Subject requires an emergent procedure. []  11. Evidence of ongoing or active clinical instability, including acute chest pain  (sudden onset), cardiogenic shock, unstable blood pressure with systolic blood pressure <29 mmHg, and  severe congestive heart failure (NYHA lll or lV) or acute  pulmonary edema.  []  12. Any active, serious, life-threatening disease with a life expectancy of less than 2 months. []  13. Currently enrolled in another study utilizing FFR CT or in an investigational trial that involves a non-approved cardiac drug or device.  []  14. Persons under the protection of justice, guardianship, or curatorship.   Reason for CCTA scan: [x]  Chest Pain []  Asymptomatic/screening     []  Prior MI     []  Dyspnea   []  Palpitations []  Abnormal ECG    []    Abnormal Stress or Perfusion   []  Other Which CT Scanner was used?          []  Philips Model: []  CT 5000 Ingenuity    []  Spectral CT 7500   []  CT6000 iCT    []  IQon         []   Incisive CT    []   Other [x]  Siemens Model: []  Somatom Definition Edge    []  Somatom Definition Flash            [x]  Somatom Force  []  Somatom Drive   []   Other []  Product/process development scientist:  []  Revolution CT  []  CardioGraph   []  Optima CT660  []   Discovery HD 750   []  Other Radiation Exposure for CCTA Only (Sum for All Coronary Series)  CT dose index (CTDI) (mGy)  _______70.88_____  Dose-length product (DLP) (mGy-cm) ____776.1______   [x]   CCTA Image Uploaded   [x]  CCTA report uploaded  [x]  All subject information redacted from image and report? Subject  Demographics: Age: __65___    Year of birth:  __1957__ Birth Sex: [x]  Male   []   Male  Weight: _111__ [] lb  [x]  kg   []  Not done Height: __72___ [x]  in  []  cm   []  Not done  Ethnicity: [x]  Not of Hispanic, Latino/a, or Spanish origin []  Hispanic, Latino/a, or Spanish origin Specify: []   [place], [place] American, Chicano/a      []  [place]  []  [place] [] other Hispanic, Latino/a, or Spanish origin Race:  []  White  [x]  Black  []  American [place] or [place] Native  []  Asian:  Specify: []  Asian [place]   []  [place]  []  Chinese  []  Vietnamese   []  Filipino  []  Other Asian  []  [place]   []  Native [place] or other Pacific Islander               []  Native Hawaiian   []  Guamanian or Chamorro  []  Samoan   []  Other Pacific Islander   []  Other (specify) Medical History: Angina within the last 45 days:   [x]  Yes   []  No If yes: [x]  Typical []  Atypical  []  Dyspnea  []  Noncardiac pain Angina type: [x]  Stable []  Unstable []  Silent Ischemia  []  Unknown []  Other Congestive Heart Failure: []  Yes [x]  No Diabetes: [x]  Yes  []  No If yes:  []  Type l   [x]  Type ll Controlled by:  []  Insulin   [x]  Oral hypoglycemic  []  Diet  []   unknown Hypertension:  [x]  Yes  []   No  (Systolic >784, diastolic >69 or req. medication) Is subject taking anti-hypertensive medication?   [x]  Yes  []   No Hyperlipidemia:   [x]  Yes  []  No Is subject taking anti-hyperlipidemic medication?    [x]  Yes  []  No Tobacco Use:  [x]  Former  []  Current  []   Non-smoker  []   Unknown Any nicotine use in 24 hours prior to CCTA?   []  Yes  [x]   No Family history of premature atherosclerotic disease?   []  Yes  [x]   No (Coronary artery disease, cerebrovascular disease, or peripheral vascular disease for male 2o relatives < 75 and/or male 2o relatives < 49 years old) Stroke:  []  Yes  [x]  No Transient Ischemic Attack (TIA):   []  Yes  [x]  No Peripheral Vascular Disease:  []  Yes  [x]  No Sedentary Lifestyle:    []  Yes   [x]  No Other Relevant Disease or Comorbidity:    []  Yes  [x]  No Specify: ___________________   PCI History: Does the subject have previously stented vessels?   []  Yes  [x]  No ______________ Total number of PCI procedures ______________ Date of most recent PCI ______________ Total number of stented vessels NOTE: CASS Segment Diagram is provided for reference in Appendix A.  PCI History Records: (complete a record for each stent) Stent 1:           Vessel: []  Right coronary artery  []  Left main artery  []  Left circumflex artery []  Left anterior descending artery   []  Other________________ CASS Segment for starting location of stent:  ______________________________ CASS Segment for ending location of stent: _______________________________ Stent manufacturer: _________________________________________________ Diameter of stent (mm): _____________   Length of stent (mm): _____________ Stent 2:           Vessel: []  Right coronary artery  []  Left main artery  []  Left circumflex artery []  Left anterior descending artery   []  Other________________ CASS Segment for starting location of stent: ______________________________ CASS  Segment for ending location of stent: _______________________________ Stent manufacturer: _________________________________________________ Diameter of stent (mm): _____________   Length of stent (mm): _____________  Coronary Tests: Any coronary tests within 90 days prior to enrollment?  []  Yes  [x]  No Invasive Coronary Angiography (ICA):    []  Yes []  No Most recent test date: _________________    Result: []  Positive  []  Negative  []  Intermediate  []  Unknown []  Image uploaded       Report: []  Uploaded  []  N/A []  All subject information redacted from images and reports? Exercise Tolerance Test (ETT):  [] Yes  []  No     Most recent test date: Click or tap to enter a date. Result:   []  Positive    []  Negative  []  Intermediate  []   Unknown []  Image uploaded    Report:  []  Uploaded  []  N/A  []   All subject information redacted from images and reports? Stress Echocardiogram: []  Yes  []  No   Most recent date:Click or tap to enter a date. Result:  []  Positive  []  Negative  []  Intermediate  []  Unknown [] Image uploaded    Report:  []  Uploaded  []  N/A      []  All images redacted? Nuclear Myocardial Perfusion Scan:    []  Yes  []  No Most recent test date: Click or tap to enter a date.  Result:   []  Positive    []  Negative  []  Intermediate  []   Unknown [] Image uploaded    Report:  []  Uploaded  []  N/A      []  All images redacted? Cardiac MRI:   []  Yes  []  No Most recent test date: Click or tap to enter a  date.  Result:   []  Positive    []  Negative  []  Intermediate  []   Unknown [] Image uploaded    Report:  []  Uploaded  []  N/A      []  All images redacted? Cardiac PET:    []  Yes  []  No Most recent test date: Click or tap to enter a date.  Result:   []  Positive    []  Negative  []  Intermediate  []   Unknown [] Image uploaded    Report:  []  Uploaded  []  N/A      []  All images redacted?  Baseline Cath Procedure: Date of procedure: 08/11/2021 Blood pressure prior to procedure: 142/89  Heart Rate prior to procedure: 70 Time of arterial access:   0748 Time of first lesion treatment:  0834 Time last guide catheter removed: 0900 Aortic pressure (mean mmHg): 102 LVED pressure (mmHg): 10 Arterial access site:   []  Radial  []  Femoral  []  Brachial Maximum arterial sheath size (Fr) 6 Venous access needle size:  []  18 G  [x]  20 G  []  4 FR  []  5 FR  []  6 FR  []  Other Left ventriculography performed?  [x]  Yes  []  No If yes, when:   [x]  Pre-intervention  []  Post- intervention LVEF (%):60 Were there any procedural complications?      []  Yes  [x]  No []  Dissection from wire  []  Dissection from catheter  []  Slow / no flow []  Allergic reaction to medication  []  Side branch occlusion  []  Other  Was the FFRCT model available & used in cath lab during the procedure? [x]  Y  []  N [x]  Angio image uploaded  [x]  Cath report uploaded  [x]  All subject information redacted from images and reports?  Intraprocedural Medications: Was adenosine administered within  24 hrs of the cath procedure?  []  Yes  [x]  No IV Adenosine start time: _______________ Adenosine dose:  []  140 ug/kg/min  []  Other dose  []  None Was nitroglycerin administered within 24 hrs of the cath procedure? []  Yes [x]  No NTG dose: _____________ NTG Route: []  IV  []  IC  []  Other  Intraprocedural Devices: (total number of each device used during the procedure): _____0____ Pressure wires & FFR wires ______1___ Guidewires   (Do not include wires used  before intervention and pressure/FFR wires) ______2___ Balloon catheters _____2____ Drug eluting stents _____0____ Bare metal stents ____1_____ Guide catheters _____1____ Intravascular ultrasound catheters _____0____ Intravascular ultrasound OCT catheters _____0____ Thrombectomy and atherectomy catheters _____0___ Temporary pacemakers _____0____ Intra-aortic balloon pumps (IABP) Intraprocedural Radiation exposure: Total fluoroscopy time ___20.2_____ (minutes) Absorbed Dose of Radiation __1090___   [x]  mGy  []  Gy Dose Area Product: ______54887______  []  Gy*cm2  []  cGy*cm2  [x]  mGy*cm2  Other: ______________ Contrast Use: (check all that apply) [x]  Ominipaque  []  Visipaque  []   Isovue  []  Iomeron  []  Ultravist   [] Oxilan []  Hypaque  []  Isopaque  []  Hexibrix  []  OptiRay  []  Iopamidol  Total amount of contrast used (mL): ___135____  IVUS/OCT: Model: []  Philips Volcano                    Catheter type:    []  Revolution  []  Eagle Eye Platinum  []  Eagle Eye  P     Platinum ST          []  Refinity ST               [x]  [employer]                   Catheter type:  []  Opticross   [x]  Opticross HD  []  Other                []  Terumo                    Catheter type:   []  ViewIT    []  Intrafocus WR  Ivus Image uploaded:  [x]  Yes  []  No OCT taken?   []  Yes   [x]  No   [x]  All subject information redacted from images and reports?  IVUS/OCT Records Was automatic pullback used for IVUS/OCT acquisition?   [x]  Yes  []  No      Pullback speed (mm/sec): ___0.5______      Length of pullback (mm): __99.0_____ Vessel 1: Image type:   [x]  IVUS   []  OCT  [x]  Right coronary artery  []  Left main artery  []  Left circumflex artery  []  Left anterior descending artery   []  Other________________  Guide catheter size:  [] 4 FR  [] 5 FR  [x] 6 FR  [] 7 FR  [] 8 FR Vessel 2: Image type:   []  IVUS   []  OCT  []  Right coronary artery  []  Left main artery  []  Left circumflex artery []  Left anterior descending  artery   []  Other________________  Guide catheter size:  [] 4 FR  [] 5 FR  [] 6 FR  [] 7 FR  [] 8 FR Vessel 3: Image type:   []  IVUS   []  OCT  []  Right coronary artery  []  Left main artery  []  Left circumflex artery []  Left anterior descending artery   []  Other________________  Guide catheter size:  [] 4 FR  [] 5 FR  [] 6 FR  [] 7 FR  [] 8 FR FFR/NHPR  Records Measurement taken during cath procedure: []  FFR  []  DFR  []  IFR  []  RFR   [] Other  Vessel 1: []  Right coronary artery  []  Left main artery  []  Left circumflex artery []  Left anterior descending artery   []  Other________________ Contrast used?     []  Yes   []  No CASS Segment for starting location of guide wire: __________________________ CASS Segment for ending location of guide wire: ___________________________ Guide catheter size:  [] 4 FR  [] 5 FR  [] 6 FR  [] 7 FR  [] 8 FR FFR Value: _____________ Pd value: ______________ Pa value: ______________ NHPR value: ____________  Vessel 2: []  Right coronary artery  []  Left main artery  []  Left circumflex artery []  Left anterior descending artery   []  Other________________ Contrast used?     []  Yes   []  No CASS Segment for starting location of guide wire: __________________________ CASS Segment for ending location of guide wire: ___________________________ Guide catheter size:  [] 4 FR  [] 5 FR  [] 6 FR  [] 7 FR  [] 8 FR FFR Value: _____________ Pd value: ______________ Pa value: ______________ NHPR value: ____________  Vessel 3: []  Right coronary artery  []  Left main artery  []  Left circumflex artery []  Left anterior descending artery   []  Other________________ Contrast used?     []  Yes   []  No CASS Segment for starting location of guide wire: __________________________ CASS Segment for ending location of guide wire: ___________________________ Guide catheter size:  [] 4 FR  [] 5 FR  [] 6 FR  [] 7 FR  [] 8 FR FFR Value: _____________ Pd value: ______________ Pa value: ______________ NHPR value:  ____________  FFR Uploads []  Angio Image  []  FFR Report  []  FFR Tracings  []  Screenshot of Angio - starting position of FFR guidewire without contrast []  Screenshot of Angio - starting position of FFR guidewire with contrast  NHPR Uploads []  Angio Image  []  NHPR Report  []  NHPR Tracings  []  Screenshot of Angio - starting position of NHPR guidewire without contrast []  Screenshot of Angio - starting position of NHPR guidewire with contrast []  All subject information redacted from images and reports?  Study completion:  Did subject complete participation in the study?    [x]  Yes  []  No If no, reason: []  Death []  Withdrew consent  []  Other  []  Screen Failure Screen Failure reason:  []  Ivus not used  []  Use of balloon prior   []  Automatic pullback not used

## 2021-08-11 NOTE — Progress Notes (Signed)
[id number] Education completed with pt and wife who voiced understanding. Stressed importance of plavix with stent. Reviewed NTG use, walking for ex, heart healthy and low carb food choices, and CRP 2. Referred to [place] CRP 2. Graylon Good RN BSN 08/11/2021 11:54 AM

## 2021-08-11 NOTE — Discharge Summary (Addendum)
Discharge Summary for Same Day PCI   Patient ID: Zachary Bowers MRN: [id number]; DOB: 1956/08/23  Admit date: 08/11/2021 Discharge date: 08/11/2021  Primary Care Provider: Shirline Frees, MD  Primary Cardiologist: Zachary Field, MD  Primary Electrophysiologist:  None   Discharge Diagnoses    Principal Problem:   Chest pain Active Problems:   Essential hypertension, benign   Hyperlipidemia  Diagnostic Studies/Procedures    Cardiac Catheterization 08/11/2021:   Ramus lesion is 80% stenosed.   Dist Cx lesion is 75% stenosed.   Mid LAD lesion is 50% stenosed.   2nd Diag lesion is 75% stenosed.   Prox Cx lesion is 25% stenosed.   Ost RCA lesion is 25% stenosed.   Prox RCA lesion is 95% stenosed.  A drug-eluting stent was successfully placed using a STENT ONYX FRONTIER 3.5X18, postdilated to 3.75 mm and optimized with intravascular ultrasound.   Post intervention, there is a 0% residual stenosis.   Dist RCA lesion is 80% stenosed.  A drug-eluting stent was successfully placed using a STENT ONYX FRONTIER 3.5X15, postdilated to 3.75 mm and optimized by intravascular ultrasound.   Post intervention, there is a 0% residual stenosis.   The left ventricular systolic function is normal.   LV end diastolic pressure is normal.   The left ventricular ejection fraction is 55-65% by visual estimate.   There is no aortic valve stenosis.   Severe tortuosity of the right subclavian.  For intervention, an 85 cm destination sheath was used to straighten out the tortuosity to engage the RCA.  We were able to more easily engage the left main without the benefit of the long destination sheath.   Successful PCI to the proximal and distal RCA.  Given his diffuse small vessel/branch vessel disease, would strongly consider long-term clopidogrel monotherapy after 6 months of dual antiplatelet therapy.   If he has persistent angina, could address the distal circumflex or the small ramus vessel  percutaneously.   Continue aggressive secondary prevention.  Diagnostic Dominance: Right Intervention   _____________   History of Present Illness     Zachary Bowers is a 65 y.o. male with a hx of CAD, DM, HTN, HLD who was seen by Dr. Audie Bowers for the evaluation of chest pain at the request of Zachary Frees, MD. Seen in ER 07/27/2021 for chest pain. Troponin negative x 2. EKG with nonspecific STT changes. He was seen in the emergency room recently. Apparently he awoke from his sleep on Sunday night prior to recent office visit with right-sided chest pressure.  He reported his symptoms lasted 2 to 3 hours.  He reported he was short of breath and had a headache.  He was seen emergency room and ruled out for acute coronary syndrome.  EKG there showed nonspecific ST-T changes.  He also demonstrates nonspecific ST-T changes today.  No further chest pain episodes.  He apparently had an episode years ago.  Left heart catheterization in 2011 showed nonobstructive CAD.  He had a myocardial perfusion imaging study in 2018 that was negative.  He has never had a heart attack or stroke.  He's diabetic.  A1c 7.9.  LDL 82 which is not at goal.  He takes Lipitor 10 mg daily.  He reports he does not exercise routinely but does yard work.  He also works with his daughters horses.  He reported no issues without work.  No recurrence of chest pain symptoms.  He does have sleep apnea.  He cannot tolerate the machine. He does have  hypertension but his blood pressure was well controlled on Micardis.  Mother had heart disease.  He is a former smoker.  No alcohol or drug use is reported.  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 Given his symptoms he was set up for Coronary CT which was abnormal with multivessel disease. Cardiac catheterization was arranged for further evaluation.  Hospital Course     The patient underwent cardiac cath as noted above with successful PCI to p/dRCA with DES x2. Plan for  DAPT with ASA/plavix for at least 6 months, but likely longer if tolerated. Does have residual disease moderate disease in the LAD of 50%, and 75% in LCx to be treated medically. The patient was seen by cardiac rehab while in short stay. There were no observed complications post cath. Radial cath site was re-evaluated prior to discharge and found to be stable without any complications. Instructions/precautions regarding cath site care were given prior to discharge. His statin was increased from Lipitor 20 to '80mg'$  daily.   Zachary Bowers was seen by Zachary Bowers and determined stable for discharge home. Follow up with our office has been arranged. Medications are listed below. Pertinent changes include addition of plavix and increased statin.  _____________  Cath/PCI Registry Performance & Quality Measures: Aspirin prescribed? - Yes ADP Receptor Inhibitor (Plavix/Clopidogrel, Brilinta/Ticagrelor or Effient/Prasugrel) prescribed (includes medically managed patients)? - Yes High Intensity Statin (Lipitor 40-'80mg'$  or Crestor 20-'40mg'$ ) prescribed? - Yes For EF <40%, was ACEI/ARB prescribed? - Not Applicable (EF >/= [id number]) For EF <40%, Aldosterone Antagonist (Spironolactone or Eplerenone) prescribed? - Not Applicable (EF >/= [id number]) Cardiac Rehab Phase II ordered (Included Medically managed Patients)? - Yes  _____________   Discharge Vitals Blood pressure (!) 161/84, pulse 63, temperature 98.8 F (37.1 C), resp. rate (!) 21, height 6' (1.829 m), weight 111.1 kg, SpO2 98 %.  Filed Weights   08/11/21 0537  Weight: 111.1 kg    Last Labs & Radiologic Studies    CBC No results for input(s): WBC, NEUTROABS, HGB, HCT, MCV, PLT in the last 72 hours. Basic Metabolic Panel No results for input(s): NA, K, CL, CO2, GLUCOSE, BUN, CREATININE, CALCIUM, MG, PHOS in the last 72 hours. Liver Function Tests No results for input(s): AST, ALT, ALKPHOS, BILITOT, PROT, ALBUMIN in the last 72 hours. No results for  input(s): LIPASE, AMYLASE in the last 72 hours. High Sensitivity Troponin:   Recent Labs  Lab 07/27/21 1309 07/27/21 1516  TROPONINIHS 2 3    BNP Invalid input(s): POCBNP D-Dimer No results for input(s): DDIMER in the last 72 hours. Hemoglobin A1C No results for input(s): HGBA1C in the last 72 hours. Fasting Lipid Panel No results for input(s): CHOL, HDL, LDLCALC, TRIG, CHOLHDL, LDLDIRECT in the last 72 hours. Thyroid Function Tests No results for input(s): TSH, T4TOTAL, T3FREE, THYROIDAB in the last 72 hours.  Invalid input(s): FREET3 _____________  DG Chest 1 View  Result Date: 07/27/2021 CLINICAL DATA:  Chest pain. EXAM: CHEST  1 VIEW COMPARISON:  Chest radiograph, 01/26/2017. FINDINGS: Cardiomediastinal size within normal limits. Normal lung inflation. No focal consolidation or mass. No pleural effusion or pneumothorax. No acute osseous abnormality. IMPRESSION: Normal chest. Electronically Signed   By: Michaelle Birks M.D.   On: 07/27/2021 13:33   CARDIAC CATHETERIZATION  Result Date: 08/11/2021   Ramus lesion is 80% stenosed.   Dist Cx lesion is 75% stenosed.   Mid LAD lesion is 50% stenosed.   2nd Diag lesion is 75%  stenosed.   Prox Cx lesion is 25% stenosed.   Ost RCA lesion is 25% stenosed.   Prox RCA lesion is 95% stenosed.  A drug-eluting stent was successfully placed using a STENT ONYX FRONTIER 3.5X18, postdilated to 3.75 mm and optimized with intravascular ultrasound.   Post intervention, there is a 0% residual stenosis.   Dist RCA lesion is 80% stenosed.  A drug-eluting stent was successfully placed using a STENT ONYX FRONTIER 3.5X15, postdilated to 3.75 mm and optimized by intravascular ultrasound.   Post intervention, there is a 0% residual stenosis.   The left ventricular systolic function is normal.   LV end diastolic pressure is normal.   The left ventricular ejection fraction is 55-65% by visual estimate.   There is no aortic valve stenosis. Severe tortuosity of the right  subclavian.  For intervention, an 85 cm destination sheath was used to straighten out the tortuosity to engage the RCA.  We were able to more easily engage the left main without the benefit of the long destination sheath. Successful PCI to the proximal and distal RCA.  Given his diffuse small vessel/branch vessel disease, would strongly consider long-term clopidogrel monotherapy after 6 months of dual antiplatelet therapy. If he has persistent angina, could address the distal circumflex or the small ramus vessel percutaneously. Continue aggressive secondary prevention.   CT CORONARY MORPH W/CTA COR W/SCORE W/CA W/CM &/OR WO/CM  Addendum Date: 08/06/2021   ADDENDUM REPORT: 08/06/2021 14:53 CLINICAL DATA:  32M with CAD, hypertension, hyperlipidemia and diabetes with chest pain. EXAM: Cardiac/Coronary  CT TECHNIQUE: The patient was scanned on a [employer]. FINDINGS: A 120 kV prospective scan was triggered in the descending thoracic aorta at 111 HU's. Axial non-contrast 3 mm slices were carried out through the heart. The data set was analyzed on a dedicated work station and scored using the [place]. Gantry rotation speed was 250 msecs and collimation was .6 mm. No beta blockade and 0.8 mg of sl NTG was given. The 3D data set was reconstructed in 5% intervals of the 67-82 % of the R-R cycle. Diastolic phases were analyzed on a dedicated work station using MPR, MIP and VRT modes. The patient received 80 cc of contrast. Aorta: Normal size. Mild calcification of the aortic root. No dissection. Aortic Valve:  Trileaflet.  No calcifications. Coronary Arteries:  Normal coronary origin.  Right dominance. RCA is a large dominant artery that gives rise to PDA and PLVB. There is moderate (50-69%) soft plaque at the ostium and severe (>70%) mixed plaque in the proximal and distal RCA Left main is a large artery that gives rise to LAD, RI, and LCX arteries. LM has minimal (<25%) calcified plaque. LAD is a  large vessel that is diffusely diseased. There is moderate (50-69%) mixed plaque in the mid LAD. D1 is a small vessel that is heavily calcified. LCX is a non-dominant artery that gives rise to one large OM1 branch. The proximal LCX has diffuse, mild (25-49%) calcified plaque. OM1 has mild (25%) calcified plaque proximally. RI is a small vessel that has moderate (50-69%) calcified plaque. Coronary Calcium Score: Left main: 50.4 Left anterior descending artery: 712 Left circumflex artery: 584 Right coronary artery: 319 Total: 1665 Percentile: 99th Other findings: Normal pulmonary vein drainage into the left atrium. There are three pulmonary veins on the right and two on the left. Normal let atrial appendage without a thrombus. Normal size of the pulmonary artery. IMPRESSION: 1. Coronary calcium score of 1665. This was 99th  percentile for age-, race-, and sex-matched controls. 2. Normal coronary origin with right dominance. 3. There is severe (>70%) plaque in the RCA and moderate (50-69%) plaque int he LAD and RI. CAD-RADS 4. 4. Will send study for FFRct. Skeet Latch, MD Electronically Signed   By: Skeet Latch M.D.   On: 08/06/2021 14:53   Result Date: 08/06/2021 EXAM: OVER-READ INTERPRETATION  CT CHEST The following report is an over-read performed by radiologist Dr. Vinnie Langton of [facility], [place] on 08/06/2021. This over-read does not include interpretation of cardiac or coronary anatomy or pathology. The coronary calcium score/coronary CTA interpretation by the cardiologist is attached. COMPARISON:  None. FINDINGS: Atherosclerotic calcifications in the thoracic aorta. Within the visualized portions of the thorax there are no suspicious appearing pulmonary nodules or masses, there is no acute consolidative airspace disease, no pleural effusions, no pneumothorax and no lymphadenopathy. Visualized portions of the upper abdomen are unremarkable. There are no aggressive appearing lytic or blastic  lesions noted in the visualized portions of the skeleton. IMPRESSION: 1.  Aortic Atherosclerosis (ICD10-I70.0). Electronically Signed: By: Vinnie Langton M.D. On: 08/06/2021 11:52   CT CORONARY FRACTIONAL FLOW RESERVE FLUID ANALYSIS  Result Date: 08/07/2021 EXAM: FFRCT ANALYSIS for abnormal coronary CT-A. FINDINGS: FFRct analysis was performed on the original cardiac CT angiogram dataset. Diagrammatic representation of the FFRct analysis is provided in a separate PDF document in PACS. This dictation was created using the PDF document and an interactive 3D model of the results. 3D model is not available in the EMR/PACS. Normal FFR range is >0.80. 1. Left Main:FFRct 0.99 (insignificant) 2. LAD: FFRct 0.96 proximal, 0.78 mid, 0.7 distal, 0.6 apical (significant). D1 FFRct 0.65 (significant) 3. LCX: FFRct 0.95 proximal.  OM1 FFRct 0.68 (significant) 4. Ramus: Not modeled 5. RCA: FFRct 0.97 ostial, 0.59 proximal (significant) IMPRESSION: FFRct findings are consistent with significant obstruciton in the LAD, D1, RCA, and OM1. Recommend cardiac catheterization. Tiffany C. Oval Linsey, MD Electronically Signed   By: Skeet Latch M.D.   On: 08/07/2021 11:31    Disposition   Pt is being discharged home today in good condition.  Follow-up Plans & Appointments     Follow-up Information     Lendon Colonel, NP Follow up on 08/21/2021.   Specialties: Nurse Practitioner, Radiology, Cardiology Why: at 9:15am for your follow up appt Contact information: [address] [place] [address] [phone]                Discharge Instructions     Amb Referral to Cardiac Rehabilitation   Complete by: As directed    Diagnosis: Coronary Stents   After initial evaluation and assessments completed: Virtual Based Care may be provided alone or in conjunction with Phase 2 Cardiac Rehab based on patient barriers.: Yes        Discharge Medications   Allergies as of 08/11/2021        Reactions   Niaspan [niacin Er] Itching        Medication List     STOP taking these medications    metoprolol tartrate 100 MG tablet Commonly known as: LOPRESSOR       TAKE these medications    amLODipine 10 MG tablet Commonly known as: NORVASC Take 10 mg by mouth daily.   aspirin 81 MG tablet Take 81 mg by mouth daily.   atorvastatin 80 MG tablet Commonly known as: LIPITOR Take 1 tablet (80 mg total) by mouth daily. What changed:  medication strength how much  to take   clopidogrel 75 MG tablet Commonly known as: PLAVIX Take 1 tablet (75 mg total) by mouth daily with breakfast. Start taking on: August 12, 2021   glimepiride 4 MG tablet Commonly known as: AMARYL Take 4 mg by mouth every morning.   glucose blood test strip 1 each by Other route as needed for other. Use as instructed   levothyroxine 125 MCG tablet Commonly known as: SYNTHROID Take 125 mcg by mouth daily before breakfast.   metFORMIN 500 MG tablet Commonly known as: GLUCOPHAGE Take 1,000 mg by mouth daily.   nitroGLYCERIN 0.4 MG SL tablet Commonly known as: NITROSTAT Place 1 tablet (0.4 mg total) under the tongue every 5 (five) minutes as needed for chest pain.   telmisartan 40 MG tablet Commonly known as: MICARDIS Take 40 mg by mouth daily.           Allergies Allergies  Allergen Reactions   Niaspan [Niacin Er] Itching    Outstanding Labs/Studies   FLP/LFTs in 8 weeks  Duration of Discharge Encounter   Greater than 30 minutes including physician time.  Signed, Reino Bellis, NP 08/11/2021, 2:18 PM   I have examined the patient and reviewed assessment and plan and discussed with patient.  Agree with above as stated.  Right radial site without hematoma.  Explained need for plavix.  Residual smaller vessel disease present.  Medical therapy or now.  If refractory sx, could plan for PCI of circumflex.   Increased intensity of statin today.   Of note, destination  sheath needed for RCA, but I suspect left main could be engaged without long sheath from right radial.   Larae Grooms

## 2021-08-11 NOTE — Interval H&P Note (Signed)
Cath Lab Visit (complete for each Cath Lab visit)  Clinical Evaluation Leading to the Procedure:   ACS: Yes.    Non-ACS:    Anginal Classification: CCS IV  Anti-ischemic medical therapy: Minimal Therapy (1 class of medications)  Non-Invasive Test Results: High-risk stress test findings: cardiac mortality >3%/year  Prior CABG: No previous CABG   Severe RCA disease.  Prior ER visit due to chest pain.   History and Physical Interval Note:  08/11/2021 7:43 AM  Zachary Bowers  has presented today for surgery, with the diagnosis of abnormal CT.  The various methods of treatment have been discussed with the patient and family. After consideration of risks, benefits and other options for treatment, the patient has consented to  Procedure(s): LEFT HEART CATH AND CORONARY ANGIOGRAPHY (N/A) as a surgical intervention.  The patient's history has been reviewed, patient examined, no change in status, stable for surgery.  I have reviewed the patient's chart and labs.  Questions were answered to the patient's satisfaction.     Larae Grooms

## 2021-08-12 ENCOUNTER — Telehealth: Payer: Self-pay | Admitting: Cardiovascular Disease

## 2021-08-12 NOTE — Telephone Encounter (Signed)
Called pt informed him of MD recommendation that he have Echo scheduled for 08/19/21.  He verbalizes understanding.

## 2021-08-12 NOTE — Telephone Encounter (Signed)
New message:  Patient just had stints put in and would like to know if he need to still do his ECHO

## 2021-08-14 ENCOUNTER — Telehealth (HOSPITAL_COMMUNITY): Payer: Self-pay

## 2021-08-14 NOTE — Telephone Encounter (Signed)
Pt insurance is active and benefits verified through [employer] $20, DED 0/0 met, out of pocket $4,000/$225 met, co-insurance 0%. no pre-authorization required. [employer]port, 08/14/2021@10 :33am, REF# [id number]   Will contact patient to see if he is interested in the Cardiac Rehab Program. If interested, patient will need to complete follow up appt. Once completed, patient will be contacted for scheduling upon review by the RN Navigator.

## 2021-08-14 NOTE — Telephone Encounter (Signed)
Called patient to see if he is interested in the Cardiac Rehab Program. Patient expressed interest. Explained scheduling process and went over insurance, patient verbalized understanding. Will contact patient for scheduling once f/u has been completed.  °

## 2021-08-19 ENCOUNTER — Other Ambulatory Visit: Payer: Self-pay

## 2021-08-19 ENCOUNTER — Ambulatory Visit (HOSPITAL_COMMUNITY): Payer: Medicare PPO | Attending: Cardiovascular Disease

## 2021-08-19 DIAGNOSIS — R079 Chest pain, unspecified: Secondary | ICD-10-CM | POA: Diagnosis not present

## 2021-08-19 LAB — ECHOCARDIOGRAM COMPLETE
Area-P 1/2: 2.03 cm2
P 1/2 time: 370 msec
S' Lateral: 2.5 cm

## 2021-08-19 NOTE — Progress Notes (Signed)
Cardiology Office Note   Date:  08/21/2021   ID:  Rayborn Maat, DOB 09/15/1956, MRN [id number]  PCP:  Shirline Frees, MD  Cardiologist:  Dr.O'Neal  No chief complaint on file.    History of Present Illness: Zachary Bowers is a 65 y.o. male who presents for posthospitalization follow-up with known history of CAD, type 2 diabetes, hypertension, and hyperlipidemia.  He was seen in the hospital on consultation for evaluation of chest pain on 07/27/2021.  This was after awakening from sleep with right-sided chest pressure lasting 2 to 3 hours with associated shortness of breath and headache  He has had documented CAD from a left heart catheterization in 2011 which revealed nonobstructive CAD at that time.  Nuclear medicine study in 2018 was negative.  Other history includes obstructive sleep apnea which has been untreated, and hyperlipidemia.  The patient underwent cardiac CT which was found to be abnormal, with multivessel disease and therefore cardiac catheterization was arranged as an outpatient.  This was completed on 08/11/2021.  This revealed multivessel disease with proximal RCA lesion of 95% stenosis, ramus lesion 80% stenosed distal circumflex lesion 75% stenosis, second diagonal lesion 75% stenosed with nonobstructive disease in the LAD proximal circumflex and ostial RCA.  He required PCI to the proximal and distal RCA, with 2 drug-eluting stents, with other lesions treated medically.  He was started on dual antiplatelet therapy with aspirin and Plavix for a minimum of 6 months but likely longer if tolerated.  He was also sent home on high intensity statin at 80 mg daily, continued on blood pressure control with amlodipine 10 mg daily, and telmisartan 40 mg daily, diabetic control with metformin 500 mg daily and glimepiride 4 mg each morning., and continue treatment for hypothyroidism with levothyroxine 125 mcg daily.  He comes today without any cardiac complaints.  He is going to have a  tooth pulled through [employer] and is asking about how soon this can be completed.  He also complains of black stools and diarrhea since returning home from the hospital.  He does not see any frank blood in the toilet.  He denies any chest discomfort ,dyspnea , on exertion or dizziness.  He is medically compliant.  Past Medical History:  Diagnosis Date   Coronary artery disease    Diabetes mellitus without complication ([place])    Hyperlipidemia    Hypertension    Hypogonadism in male    OSA (obstructive sleep apnea)    Thyroid disease     Past Surgical History:  Procedure Laterality Date   APPENDECTOMY     APPENDECTOMY Right    CARDIAC CATHETERIZATION     CORONARY STENT INTERVENTION N/A 08/11/2021   Procedure: CORONARY STENT INTERVENTION;  Surgeon: Jettie Booze, MD;  Location: [place] [facility];  Service: Cardiovascular;  Laterality: N/A;   INTRAVASCULAR ULTRASOUND/IVUS N/A 08/11/2021   Procedure: Intravascular Ultrasound/IVUS;  Surgeon: Jettie Booze, MD;  Location: [place] [facility];  Service: Cardiovascular;  Laterality: N/A;   LEFT HEART CATH AND CORONARY ANGIOGRAPHY N/A 08/11/2021   Procedure: LEFT HEART CATH AND CORONARY ANGIOGRAPHY;  Surgeon: Jettie Booze, MD;  Location: [place] [facility];  Service: Cardiovascular;  Laterality: N/A;     Current Outpatient Medications  Medication Sig Dispense Refill   amLODipine (NORVASC) 10 MG tablet Take 10 mg by mouth daily.     aspirin 81 MG tablet Take 81 mg by mouth daily.     atorvastatin (LIPITOR) 80 MG tablet Take 1  tablet (80 mg total) by mouth daily. 90 tablet 0   clopidogrel (PLAVIX) 75 MG tablet Take 1 tablet (75 mg total) by mouth daily with breakfast. 90 tablet 2   glimepiride (AMARYL) 4 MG tablet Take 4 mg by mouth every morning.     glucose blood test strip 1 each by Other route as needed for other. Use as instructed     levothyroxine (SYNTHROID, LEVOTHROID) 125 MCG tablet Take 125 mcg by  mouth daily before breakfast.   0   metFORMIN (GLUCOPHAGE) 500 MG tablet Take 1,000 mg by mouth daily.     nitroGLYCERIN (NITROSTAT) 0.4 MG SL tablet Place 1 tablet (0.4 mg total) under the tongue every 5 (five) minutes as needed for chest pain. 25 tablet 5   telmisartan (MICARDIS) 40 MG tablet Take 40 mg by mouth daily.     No current facility-administered medications for this visit.    Allergies:   Niaspan [niacin er]    Social History:  The patient  reports that he has quit smoking. He has quit using smokeless tobacco.  His smokeless tobacco use included chew. He reports that he does not drink alcohol and does not use drugs.   Family History:  The patient's family history includes Alzheimer's disease in his father; Heart disease in his mother.    ROS: All other systems are reviewed and negative. Unless otherwise mentioned in H&P    PHYSICAL EXAM: VS:  BP 120/76 (BP Location: Left Arm, Patient Position: Sitting, Cuff Size: Normal)   Pulse 98   Resp 20   Ht 6' (1.829 m)   Wt 239 lb (108.4 kg)   SpO2 97%   BMI 32.41 kg/m  , BMI Body mass index is 32.41 kg/m. GEN: Well nourished, well developed, in no acute distress, obese HEENT: normal Neck: no JVD, carotid bruits, or masses Cardiac: RRR; no murmurs, rubs, or gallops,no edema  Respiratory:  Clear to auscultation bilaterally, normal work of breathing GI: soft, exquisite tenderness in the right upper quadrant, positive Murphy's sign., nondistended, + BS MS: no deformity or atrophy Skin: warm and dry, no rash Neuro:  Strength and sensation are intact Psych: euthymic mood, full affect   EKG:  EKG is not ordered today.  No Weight in the last 1 medical findings   Recent Labs: 07/27/2021: BUN 10; Creatinine, Ser 1.22; Hemoglobin 12.9; Platelets 269; Potassium 3.8; Sodium 135    Lipid Panel No results found for: CHOL, TRIG, HDL, CHOLHDL, VLDL, LDLCALC, LDLDIRECT    Wt Readings from Last 3 Encounters:  08/21/21 239 lb  (108.4 kg)  08/11/21 245 lb (111.1 kg)  07/30/21 246 lb 6.4 oz (111.8 kg)      Other studies Reviewed: Cardiac Catheterization 08/11/2021:    Ramus lesion is 80% stenosed.   Dist Cx lesion is 75% stenosed.   Mid LAD lesion is 50% stenosed.   2nd Diag lesion is 75% stenosed.   Prox Cx lesion is 25% stenosed.   Ost RCA lesion is 25% stenosed.   Prox RCA lesion is 95% stenosed.  A drug-eluting stent was successfully placed using a STENT ONYX FRONTIER 3.5X18, postdilated to 3.75 mm and optimized with intravascular ultrasound.   Post intervention, there is a 0% residual stenosis.   Dist RCA lesion is 80% stenosed.  A drug-eluting stent was successfully placed using a STENT ONYX FRONTIER 3.5X15, postdilated to 3.75 mm and optimized by intravascular ultrasound.   Post intervention, there is a 0% residual stenosis.   The left  ventricular systolic function is normal.   LV end diastolic pressure is normal.   The left ventricular ejection fraction is 55-65% by visual estimate.   There is no aortic valve stenosis.   Severe tortuosity of the right subclavian.  For intervention, an 85 cm destination sheath was used to straighten out the tortuosity to engage the RCA.  We were able to more easily engage the left main without the benefit of the long destination sheath.   Successful PCI to the proximal and distal RCA.  Given his diffuse small vessel/branch vessel disease, would strongly consider long-term clopidogrel monotherapy after 6 months of dual antiplatelet therapy.   If he has persistent angina, could address the distal circumflex or the small ramus vessel percutaneously.   Continue aggressive secondary prevention.   Diagnostic Dominance: Right Intervention   _____________ ASSESSMENT AND PLAN:  1.  Coronary artery disease: Status post drug-eluting stent x2 to the proximal and distal right coronary artery.  He continues on dual antiplatelet therapy, statin therapy.  He is compliant.  He  will need to have follow-up labs in 3 months to evaluate his status.  I have gone over his Cath Lab report and given him a copy of the illustration and answered questions concerning his stents, location, and treatment regimen.  2.  Diarrhea with black stools: This is concerning since he is on a DOAC.  I will draw a CBC today.  We will need to evaluate for bleeding issue since he has newly been started on Plavix.  May need referral to GI if this continues.  3.  Right upper quadrant pain: During exam of his abdomen he was exquisitely tender in the right upper quadrant with positive Murphy sign.  I will schedule him for a abdominal ultrasound, to check gallbladder and liver.  Palpating liver was not done as when I tried he was having pain.  Will send results to his primary care physician Dr. Kenton Kingfisher.  4.  Hypertension: Currently well controlled on medication regimen no changes in his medications at this time.  Follow-up labs will be ordered when seen on follow-up.  5.  Hyperlipidemia: Goal of LDL less than 70.  Follow-up labs will be completed on next visit  6.  Tooth extraction: I explained to him that he may not have a tooth extraction which would require him to stop taking Plavix for a minimum of 6 months.  A letter is also written to his dentist so that they are aware of this.  A letter was faxed today to [facility] dentistry [phone]).   Current medicines are reviewed at length with the patient today.  I have spent 45 min's dedicated to the care of this patient on the date of this encounter to include pre-visit review of records, assessment, management and diagnostic testing,with shared decision making.  Labs/ tests ordered today include:GB Ultrasound, Hemoccult stool, CBC, BMET.  Phill Myron. West Pugh, ANP, [facility]   08/21/2021 10:58 AM    [facility] [address] Office [phone] Fax [phone]  Notice: This dictation was prepared with  Dragon dictation along with smaller phrase technology. Any transcriptional errors that result from this process are unintentional and may not be corrected upon review.

## 2021-08-21 ENCOUNTER — Encounter: Payer: Self-pay | Admitting: Adult Health

## 2021-08-21 ENCOUNTER — Other Ambulatory Visit: Payer: Self-pay | Admitting: Adult Health

## 2021-08-21 ENCOUNTER — Ambulatory Visit: Payer: Medicare PPO | Admitting: Adult Health

## 2021-08-21 ENCOUNTER — Other Ambulatory Visit: Payer: Self-pay

## 2021-08-21 VITALS — BP 120/76 | HR 98 | Resp 20 | Ht 72.0 in | Wt 239.0 lb

## 2021-08-21 DIAGNOSIS — R1011 Right upper quadrant pain: Secondary | ICD-10-CM

## 2021-08-21 DIAGNOSIS — R197 Diarrhea, unspecified: Secondary | ICD-10-CM | POA: Diagnosis not present

## 2021-08-21 DIAGNOSIS — K921 Melena: Secondary | ICD-10-CM

## 2021-08-21 DIAGNOSIS — R198 Other specified symptoms and signs involving the digestive system and abdomen: Secondary | ICD-10-CM | POA: Diagnosis not present

## 2021-08-21 LAB — CBC
Hematocrit: 34.3 % — ABNORMAL LOW (ref 37.5–51.0)
Hemoglobin: 11.2 g/dL — ABNORMAL LOW (ref 13.0–17.7)
MCH: 27.1 pg (ref 26.6–33.0)
MCHC: 32.7 g/dL (ref 31.5–35.7)
MCV: 83 fL (ref 79–97)
Platelets: 296 10*3/uL (ref 150–450)
RBC: 4.13 x10E6/uL — ABNORMAL LOW (ref 4.14–5.80)
RDW: 12.7 % (ref 11.6–15.4)
WBC: 9.1 10*3/uL (ref 3.4–10.8)

## 2021-08-21 LAB — BASIC METABOLIC PANEL
BUN/Creatinine Ratio: 17 (ref 10–24)
BUN: 23 mg/dL (ref 8–27)
CO2: 21 mmol/L (ref 20–29)
Calcium: 10.1 mg/dL (ref 8.6–10.2)
Chloride: 98 mmol/L (ref 96–106)
Creatinine, Ser: 1.38 mg/dL — ABNORMAL HIGH (ref 0.76–1.27)
Glucose: 219 mg/dL — ABNORMAL HIGH (ref 65–99)
Potassium: 4.5 mmol/L (ref 3.5–5.2)
Sodium: 136 mmol/L (ref 134–144)
eGFR: 56 mL/min/{1.73_m2} — ABNORMAL LOW (ref >59)

## 2021-08-21 NOTE — Patient Instructions (Addendum)
Medication Instructions:   Your physician recommends that you continue on your current medications as directed. Please refer to the Current Medication list given to you today.  *If you need a refill on your cardiac medications before your next appointment, please call your pharmacy*   Lab Work: TODAY CBC AND BMET    ALSO GO TO ONE OF THE FOLLOWING LOCATIONS FOR A HEMOCULT CARD TO COLLECT AND RETURN FOR RESULTS    If you have labs (blood work) drawn today and your tests are completely normal, you will receive your results only by: MyChart Message (if you have MyChart) OR A paper copy in the mail If you have any lab test that is abnormal or we need to change your treatment, we will call you to review the results.   Testing/Procedures: TO HAVE AN ULTRASOUND OF YOUR GALLBLADDER AT [facility]     Follow-Up: At [facility], you and your health needs are our priority.  As part of our continuing mission to provide you with exceptional heart care, we have created designated Provider Care Teams.  These Care Teams include your primary Cardiologist (physician) and Advanced Practice Providers (APPs -  Physician Assistants and Nurse Practitioners) who all work together to provide you with the care you need, when you need it.  We recommend signing up for the patient portal called "MyChart".  Sign up information is provided on this After Visit Summary.  MyChart is used to connect with patients for Virtual Visits (Telemedicine).  Patients are able to view lab/test results, encounter notes, upcoming appointments, etc.  Non-urgent messages can be sent to your provider as well.   To learn more about what you can do with MyChart, go to [email or web address].    Your next appointment:  AS SCHEDULED   The format for your next appointment:   In Person  Provider:   Eleonore Chiquito, MD   Other Instructions

## 2021-08-24 DIAGNOSIS — K921 Melena: Secondary | ICD-10-CM | POA: Diagnosis not present

## 2021-08-24 DIAGNOSIS — R197 Diarrhea, unspecified: Secondary | ICD-10-CM | POA: Diagnosis not present

## 2021-08-25 ENCOUNTER — Other Ambulatory Visit (HOSPITAL_BASED_OUTPATIENT_CLINIC_OR_DEPARTMENT_OTHER): Payer: Self-pay

## 2021-08-25 ENCOUNTER — Ambulatory Visit (HOSPITAL_COMMUNITY)
Admission: RE | Admit: 2021-08-25 | Discharge: 2021-08-25 | Disposition: A | Discharge status: Home / Self Care | Payer: Medicare PPO | Source: Ambulatory Visit | Attending: Adult Health | Admitting: Adult Health

## 2021-08-25 ENCOUNTER — Telehealth: Payer: Self-pay | Admitting: Cardiovascular Disease

## 2021-08-25 ENCOUNTER — Other Ambulatory Visit: Payer: Self-pay

## 2021-08-25 DIAGNOSIS — R1011 Right upper quadrant pain: Secondary | ICD-10-CM | POA: Diagnosis not present

## 2021-08-25 DIAGNOSIS — R198 Other specified symptoms and signs involving the digestive system and abdomen: Secondary | ICD-10-CM | POA: Insufficient documentation

## 2021-08-25 DIAGNOSIS — R109 Unspecified abdominal pain: Secondary | ICD-10-CM | POA: Diagnosis not present

## 2021-08-25 NOTE — Telephone Encounter (Signed)
Spoke to patient stated he has been feeling dizzy and light headed, sob off and on for the past 1 week.He only notices when he is up walking.B/P at present 110/79 pulse 100.O2 sat 100 %.He has not been checking blood sugars.Advised to check blood sugar.Blood sugar at present 358.Advised to call PCP.

## 2021-08-25 NOTE — Telephone Encounter (Signed)
STAT if patient feels like he/she is going to faint   Are you dizzy now? A little, when he walks he get  that way  Do you feel faint or have you passed out? This morning he thought he was, he fell on the couch  Do you have any other symptoms? Nauseated, some shortness of breath, a little bit, worse when he walks- this morning he says his oxygen was 48 and puse was over a hundred  Have you checked your HR and BP (record if available)? 112/79- last time he checked

## 2021-08-26 ENCOUNTER — Telehealth: Payer: Self-pay | Admitting: Cardiovascular Disease

## 2021-08-26 ENCOUNTER — Telehealth: Payer: Self-pay

## 2021-08-26 NOTE — Telephone Encounter (Addendum)
Spoke with patient regarding results. Faxed results to patient PCP Dr. Kenton Kingfisher at [facility].----- Message from Lendon Colonel, NP sent at 08/23/2021  3:06 PM EDT ----- I have reviewed his labs. His Hgb is down one gram from 3 weeks ago.  He was complaining of dark stools. Please make sure his hemoccult stool order is still in.  It looks like it was cancelled. If positive, he will need to see GI. Blood glucose is elevated. He was not fasting. He is diabetic. He will need to follow up with PCP for management of this.   Thanks, Curt Bears

## 2021-08-26 NOTE — Telephone Encounter (Signed)
   [facility] Pre-operative Risk Assessment    Patient Name: Zachary Bowers  DOB: 1956/04/10 MRN: [id number]  HEARTCARE STAFF:  - IMPORTANT!!!!!! Under Visit Info/Reason for Call, type in Other and utilize the format Clearance MM/DD/YY or Clearance TBD. Do not use dashes or single digits. - Please review there is not already an duplicate clearance open for this procedure. - If request is for dental extraction, please clarify the # of teeth to be extracted. - If the patient is currently at the dentist's office, call Pre-Op Callback Staff (MA/nurse) to input urgent request.  - If the patient is not currently in the dentist office, please route to the Pre-Op pool.  Request for surgical clearance:  What type of surgery is being performed? 1 tooth extraction  When is this surgery scheduled? 08/26/2021  What type of clearance is required (medical clearance vs. Pharmacy clearance to hold med vs. Both)? Both  Are there any medications that need to be held prior to surgery and how long? patient is on blood thinners, but is already in the chair  Practice name and name of physician performing surgery? [facility]  What is the office phone number? [phone]   7.   What is the office fax number? [phone]  8.   Anesthesia type (None, local, MAC, general) ? none   Selena Zobro 08/26/2021, 3:41 PM  _________________________________________________________________   (provider comments below)   Office may not have time to do extraction today, but need clearance for the future as well.

## 2021-08-27 ENCOUNTER — Other Ambulatory Visit (HOSPITAL_BASED_OUTPATIENT_CLINIC_OR_DEPARTMENT_OTHER): Payer: Self-pay

## 2021-08-27 NOTE — Telephone Encounter (Signed)
   Patient Name: Zachary Bowers  DOB: 09-Feb-1956 MRN: [id number]  Primary Cardiologist: Evalina Field, MD  Chart reviewed as part of pre-operative protocol coverage.   Simple dental extractions are considered low risk procedures per guidelines and generally do not require any specific cardiac clearance. It is also generally accepted that for simple extractions and dental cleanings, there is no need to interrupt blood thinner therapy.  As outlined above and in the office note from 08/21/2021, Plavix should not be held for his dental extraction.  SBE prophylaxis is not required for the patient from a cardiac standpoint.  I will route this recommendation to the requesting party via Epic fax function and remove from pre-op pool.  Please call with questions.  Arvil Chaco, PA-C 08/27/2021, 10:40 AM

## 2021-08-28 ENCOUNTER — Telehealth: Payer: Self-pay

## 2021-08-28 ENCOUNTER — Telehealth (HOSPITAL_BASED_OUTPATIENT_CLINIC_OR_DEPARTMENT_OTHER): Payer: Self-pay | Admitting: Pharmacist

## 2021-08-28 NOTE — Telephone Encounter (Addendum)
Spoke with patient regarding results.----- Message from Lendon Colonel, NP sent at 08/27/2021  5:31 PM EDT ----- I have reviewed his ultrasound. No evidence of gallstones.  He does have some fatty liver indications on the ultrasound.  He will need to see his PCP for any further testing if they feel it is warranted.Marland Kitchen  He may need a HIDA scan, or MRI if his symptoms of abdominal pain in the RUQ that I elicited during examination, but can be ordered by his PCP.  Still waiting on stool for hemoccult. He said his stools were black.  He is on Plavix. Can we check on this please?   Thank you, KL

## 2021-08-28 NOTE — Telephone Encounter (Signed)
Transitions of Care Pharmacy   Call attempted for a pharmacy transitions of care follow-up. HIPAA appropriate voicemail was left with call back information provided.   Total call attempts: [place], PharmD [place] at [facility]  08/28/2021 11:00 AM

## 2021-10-05 ENCOUNTER — Ambulatory Visit
Admission: RE | Admit: 2021-10-05 | Discharge: 2021-10-05 | Disposition: A | Discharge status: Home / Self Care | Payer: Medicare PPO | Source: Ambulatory Visit | Attending: Family Medicine | Admitting: Family Medicine

## 2021-10-05 ENCOUNTER — Other Ambulatory Visit: Payer: Self-pay | Admitting: Family Medicine

## 2021-10-05 DIAGNOSIS — Z23 Encounter for immunization: Secondary | ICD-10-CM | POA: Diagnosis not present

## 2021-10-05 DIAGNOSIS — R051 Acute cough: Secondary | ICD-10-CM

## 2021-10-05 DIAGNOSIS — I10 Essential (primary) hypertension: Secondary | ICD-10-CM | POA: Diagnosis not present

## 2021-10-05 DIAGNOSIS — E7800 Pure hypercholesterolemia, unspecified: Secondary | ICD-10-CM | POA: Diagnosis not present

## 2021-10-05 DIAGNOSIS — E1142 Type 2 diabetes mellitus with diabetic polyneuropathy: Secondary | ICD-10-CM | POA: Diagnosis not present

## 2021-10-05 DIAGNOSIS — R062 Wheezing: Secondary | ICD-10-CM | POA: Diagnosis not present

## 2021-10-05 DIAGNOSIS — E039 Hypothyroidism, unspecified: Secondary | ICD-10-CM | POA: Diagnosis not present

## 2021-10-05 DIAGNOSIS — R059 Cough, unspecified: Secondary | ICD-10-CM | POA: Diagnosis not present

## 2021-10-05 DIAGNOSIS — G4730 Sleep apnea, unspecified: Secondary | ICD-10-CM | POA: Diagnosis not present

## 2021-10-05 DIAGNOSIS — I2510 Atherosclerotic heart disease of native coronary artery without angina pectoris: Secondary | ICD-10-CM | POA: Diagnosis not present

## 2021-10-06 DIAGNOSIS — D62 Acute posthemorrhagic anemia: Secondary | ICD-10-CM | POA: Diagnosis not present

## 2021-10-07 ENCOUNTER — Other Ambulatory Visit: Payer: Self-pay

## 2021-10-07 ENCOUNTER — Inpatient Hospital Stay (HOSPITAL_COMMUNITY)
Admission: EM | Admit: 2021-10-07 | Discharge: 2021-10-11 | DRG: 375 | Disposition: A | Discharge status: Home / Self Care | Payer: Medicare PPO | Attending: Family Medicine | Admitting: Family Medicine

## 2021-10-07 DIAGNOSIS — D490 Neoplasm of unspecified behavior of digestive system: Secondary | ICD-10-CM | POA: Diagnosis not present

## 2021-10-07 DIAGNOSIS — C161 Malignant neoplasm of fundus of stomach: Secondary | ICD-10-CM | POA: Diagnosis not present

## 2021-10-07 DIAGNOSIS — K2289 Other specified disease of esophagus: Secondary | ICD-10-CM | POA: Diagnosis not present

## 2021-10-07 DIAGNOSIS — Z79899 Other long term (current) drug therapy: Secondary | ICD-10-CM

## 2021-10-07 DIAGNOSIS — E7800 Pure hypercholesterolemia, unspecified: Secondary | ICD-10-CM | POA: Diagnosis present

## 2021-10-07 DIAGNOSIS — I10 Essential (primary) hypertension: Secondary | ICD-10-CM | POA: Diagnosis present

## 2021-10-07 DIAGNOSIS — D649 Anemia, unspecified: Secondary | ICD-10-CM | POA: Diagnosis present

## 2021-10-07 DIAGNOSIS — Z7984 Long term (current) use of oral hypoglycemic drugs: Secondary | ICD-10-CM | POA: Diagnosis not present

## 2021-10-07 DIAGNOSIS — K317 Polyp of stomach and duodenum: Secondary | ICD-10-CM | POA: Diagnosis not present

## 2021-10-07 DIAGNOSIS — Z87891 Personal history of nicotine dependence: Secondary | ICD-10-CM

## 2021-10-07 DIAGNOSIS — Z888 Allergy status to other drugs, medicaments and biological substances status: Secondary | ICD-10-CM | POA: Diagnosis not present

## 2021-10-07 DIAGNOSIS — Z800 Family history of malignant neoplasm of digestive organs: Secondary | ICD-10-CM | POA: Diagnosis not present

## 2021-10-07 DIAGNOSIS — I2510 Atherosclerotic heart disease of native coronary artery without angina pectoris: Secondary | ICD-10-CM | POA: Diagnosis present

## 2021-10-07 DIAGNOSIS — I25118 Atherosclerotic heart disease of native coronary artery with other forms of angina pectoris: Secondary | ICD-10-CM | POA: Diagnosis not present

## 2021-10-07 DIAGNOSIS — Z79890 Hormone replacement therapy (postmenopausal): Secondary | ICD-10-CM | POA: Diagnosis not present

## 2021-10-07 DIAGNOSIS — C169 Malignant neoplasm of stomach, unspecified: Principal | ICD-10-CM | POA: Diagnosis present

## 2021-10-07 DIAGNOSIS — Z8249 Family history of ischemic heart disease and other diseases of the circulatory system: Secondary | ICD-10-CM | POA: Diagnosis not present

## 2021-10-07 DIAGNOSIS — D62 Acute posthemorrhagic anemia: Secondary | ICD-10-CM | POA: Diagnosis present

## 2021-10-07 DIAGNOSIS — Z7902 Long term (current) use of antithrombotics/antiplatelets: Secondary | ICD-10-CM

## 2021-10-07 DIAGNOSIS — Z20822 Contact with and (suspected) exposure to covid-19: Secondary | ICD-10-CM | POA: Diagnosis present

## 2021-10-07 DIAGNOSIS — K3189 Other diseases of stomach and duodenum: Secondary | ICD-10-CM | POA: Diagnosis present

## 2021-10-07 DIAGNOSIS — E039 Hypothyroidism, unspecified: Secondary | ICD-10-CM | POA: Diagnosis present

## 2021-10-07 DIAGNOSIS — G4733 Obstructive sleep apnea (adult) (pediatric): Secondary | ICD-10-CM | POA: Diagnosis present

## 2021-10-07 DIAGNOSIS — Z794 Long term (current) use of insulin: Secondary | ICD-10-CM

## 2021-10-07 DIAGNOSIS — E119 Type 2 diabetes mellitus without complications: Secondary | ICD-10-CM | POA: Diagnosis present

## 2021-10-07 DIAGNOSIS — Z7982 Long term (current) use of aspirin: Secondary | ICD-10-CM | POA: Diagnosis not present

## 2021-10-07 DIAGNOSIS — C160 Malignant neoplasm of cardia: Secondary | ICD-10-CM | POA: Diagnosis not present

## 2021-10-07 DIAGNOSIS — Z955 Presence of coronary angioplasty implant and graft: Secondary | ICD-10-CM

## 2021-10-07 DIAGNOSIS — R933 Abnormal findings on diagnostic imaging of other parts of digestive tract: Secondary | ICD-10-CM | POA: Diagnosis not present

## 2021-10-07 DIAGNOSIS — K921 Melena: Secondary | ICD-10-CM | POA: Diagnosis present

## 2021-10-07 DIAGNOSIS — R1031 Right lower quadrant pain: Secondary | ICD-10-CM | POA: Diagnosis not present

## 2021-10-07 DIAGNOSIS — I252 Old myocardial infarction: Secondary | ICD-10-CM

## 2021-10-07 DIAGNOSIS — I700 Atherosclerosis of aorta: Secondary | ICD-10-CM | POA: Diagnosis not present

## 2021-10-07 DIAGNOSIS — R590 Localized enlarged lymph nodes: Secondary | ICD-10-CM | POA: Diagnosis not present

## 2021-10-07 LAB — BASIC METABOLIC PANEL
Anion gap: 6 (ref 5–15)
BUN: 8 mg/dL (ref 8–23)
CO2: 24 mmol/L (ref 22–32)
Calcium: 9.3 mg/dL (ref 8.9–10.3)
Chloride: 109 mmol/L (ref 98–111)
Creatinine, Ser: 1.1 mg/dL (ref 0.61–1.24)
GFR, Estimated: >60 mL/min (ref >60)
Glucose, Bld: 82 mg/dL (ref 70–99)
Potassium: 4 mmol/L (ref 3.5–5.1)
Sodium: 139 mmol/L (ref 135–145)

## 2021-10-07 LAB — URINALYSIS, ROUTINE W REFLEX MICROSCOPIC
Bilirubin Urine: NEGATIVE
Glucose, UA: NEGATIVE mg/dL
Hgb urine dipstick: NEGATIVE
Ketones, ur: NEGATIVE mg/dL
Leukocytes,Ua: NEGATIVE
Nitrite: NEGATIVE
Protein, ur: NEGATIVE mg/dL
Specific Gravity, Urine: 1.019 (ref 1.005–1.030)
pH: 6 (ref 5.0–8.0)

## 2021-10-07 LAB — CBC
HCT: 24.1 % — ABNORMAL LOW (ref 39.0–52.0)
Hemoglobin: 7 g/dL — ABNORMAL LOW (ref 13.0–17.0)
MCH: 21.9 pg — ABNORMAL LOW (ref 26.0–34.0)
MCHC: 29 g/dL — ABNORMAL LOW (ref 30.0–36.0)
MCV: 75.5 fL — ABNORMAL LOW (ref 80.0–100.0)
Platelets: 486 10*3/uL — ABNORMAL HIGH (ref 150–400)
RBC: 3.19 MIL/uL — ABNORMAL LOW (ref 4.22–5.81)
RDW: 17.2 % — ABNORMAL HIGH (ref 11.5–15.5)
WBC: 7.3 10*3/uL (ref 4.0–10.5)
nRBC: 0 % (ref 0.0–0.2)

## 2021-10-07 NOTE — ED Triage Notes (Signed)
Pt with generalized weakness, ongoing since hospitalization two months ago. Endorses some dizziness when standing up from sitting position. Had routine labs drawn at PCP yesterday and was told to come to ED for a blood transfusion.

## 2021-10-07 NOTE — ED Provider Notes (Signed)
Emergency Medicine Provider Triage Evaluation Note  Zachary Bowers , a 65 y.o. male  was evaluated in triage.  Pt complains of generalized weakness x2 months.  Patient was discharged from the hospital back in August after he had cardiac stents placed.  Had a routine visit at primary care on Monday, with abnormal hemoglobin results.  Was told to come to the emergency department for blood transfusion.  He is also complaining of some right lower quadrant pain that started earlier today.   Review of Systems  Positive: Fatigue, intermittent dizziness Negative: Chest pain, shortness of breath, nausea, vomiting, diarrhea, dark stools  Physical Exam  BP (!) 145/87 (BP Location: Right Arm)   Pulse 86   Temp 98.4 F (36.9 C)   Resp 18   SpO2 100%  Gen:   Awake, no distress   Resp:  Normal effort  MSK:   Moves extremities without difficulty  Other:  Abdomen soft, nontender nondistended  Medical Decision Making  Medically screening exam initiated at 4:59 PM.  Appropriate orders placed.  Zachary Bowers was informed that the remainder of the evaluation will be completed by another provider, this initial triage assessment does not replace that evaluation, and the importance of remaining in the ED until their evaluation is complete.     Estill Cotta 10/07/21 1700    Drenda Freeze, MD 10/07/21 2225

## 2021-10-08 ENCOUNTER — Emergency Department (HOSPITAL_COMMUNITY): Payer: Medicare PPO

## 2021-10-08 DIAGNOSIS — I10 Essential (primary) hypertension: Secondary | ICD-10-CM

## 2021-10-08 DIAGNOSIS — Z79890 Hormone replacement therapy (postmenopausal): Secondary | ICD-10-CM | POA: Diagnosis not present

## 2021-10-08 DIAGNOSIS — G4733 Obstructive sleep apnea (adult) (pediatric): Secondary | ICD-10-CM | POA: Diagnosis present

## 2021-10-08 DIAGNOSIS — K3189 Other diseases of stomach and duodenum: Secondary | ICD-10-CM | POA: Diagnosis not present

## 2021-10-08 DIAGNOSIS — D62 Acute posthemorrhagic anemia: Secondary | ICD-10-CM | POA: Diagnosis present

## 2021-10-08 DIAGNOSIS — Z888 Allergy status to other drugs, medicaments and biological substances status: Secondary | ICD-10-CM | POA: Diagnosis not present

## 2021-10-08 DIAGNOSIS — Z955 Presence of coronary angioplasty implant and graft: Secondary | ICD-10-CM | POA: Diagnosis not present

## 2021-10-08 DIAGNOSIS — I25118 Atherosclerotic heart disease of native coronary artery with other forms of angina pectoris: Secondary | ICD-10-CM

## 2021-10-08 DIAGNOSIS — Z20822 Contact with and (suspected) exposure to covid-19: Secondary | ICD-10-CM | POA: Diagnosis present

## 2021-10-08 DIAGNOSIS — D649 Anemia, unspecified: Secondary | ICD-10-CM

## 2021-10-08 DIAGNOSIS — Z7982 Long term (current) use of aspirin: Secondary | ICD-10-CM | POA: Diagnosis not present

## 2021-10-08 DIAGNOSIS — Z794 Long term (current) use of insulin: Secondary | ICD-10-CM | POA: Diagnosis not present

## 2021-10-08 DIAGNOSIS — C169 Malignant neoplasm of stomach, unspecified: Secondary | ICD-10-CM | POA: Diagnosis present

## 2021-10-08 DIAGNOSIS — E119 Type 2 diabetes mellitus without complications: Secondary | ICD-10-CM | POA: Diagnosis present

## 2021-10-08 DIAGNOSIS — K921 Melena: Secondary | ICD-10-CM | POA: Diagnosis present

## 2021-10-08 DIAGNOSIS — E7800 Pure hypercholesterolemia, unspecified: Secondary | ICD-10-CM | POA: Diagnosis present

## 2021-10-08 DIAGNOSIS — Z800 Family history of malignant neoplasm of digestive organs: Secondary | ICD-10-CM | POA: Diagnosis not present

## 2021-10-08 DIAGNOSIS — Z79899 Other long term (current) drug therapy: Secondary | ICD-10-CM | POA: Diagnosis not present

## 2021-10-08 DIAGNOSIS — E039 Hypothyroidism, unspecified: Secondary | ICD-10-CM | POA: Diagnosis present

## 2021-10-08 DIAGNOSIS — Z7984 Long term (current) use of oral hypoglycemic drugs: Secondary | ICD-10-CM | POA: Diagnosis not present

## 2021-10-08 DIAGNOSIS — Z87891 Personal history of nicotine dependence: Secondary | ICD-10-CM | POA: Diagnosis not present

## 2021-10-08 DIAGNOSIS — Z7902 Long term (current) use of antithrombotics/antiplatelets: Secondary | ICD-10-CM | POA: Diagnosis not present

## 2021-10-08 DIAGNOSIS — I252 Old myocardial infarction: Secondary | ICD-10-CM | POA: Diagnosis not present

## 2021-10-08 DIAGNOSIS — Z8249 Family history of ischemic heart disease and other diseases of the circulatory system: Secondary | ICD-10-CM | POA: Diagnosis not present

## 2021-10-08 DIAGNOSIS — I2510 Atherosclerotic heart disease of native coronary artery without angina pectoris: Secondary | ICD-10-CM | POA: Diagnosis present

## 2021-10-08 LAB — MRSA NEXT GEN BY PCR, NASAL: MRSA by PCR Next Gen: NOT DETECTED

## 2021-10-08 LAB — RESP PANEL BY RT-PCR (FLU A&B, COVID) ARPGX2
Influenza A by PCR: NEGATIVE
Influenza B by PCR: NEGATIVE
SARS Coronavirus 2 by RT PCR: NEGATIVE

## 2021-10-08 LAB — HEMOGLOBIN A1C
Hgb A1c MFr Bld: 6.8 % — ABNORMAL HIGH (ref 4.8–5.6)
Mean Plasma Glucose: 148.46 mg/dL

## 2021-10-08 LAB — HEMOGLOBIN AND HEMATOCRIT, BLOOD
HCT: 31.4 % — ABNORMAL LOW (ref 39.0–52.0)
Hemoglobin: 9.8 g/dL — ABNORMAL LOW (ref 13.0–17.0)

## 2021-10-08 LAB — PREPARE RBC (CROSSMATCH)

## 2021-10-08 LAB — CBG MONITORING, ED
Glucose-Capillary: 113 mg/dL — ABNORMAL HIGH (ref 70–99)
Glucose-Capillary: 116 mg/dL — ABNORMAL HIGH (ref 70–99)
Glucose-Capillary: 219 mg/dL — ABNORMAL HIGH (ref 70–99)

## 2021-10-08 LAB — HIV ANTIBODY (ROUTINE TESTING W REFLEX): HIV Screen 4th Generation wRfx: NONREACTIVE

## 2021-10-08 LAB — GLUCOSE, CAPILLARY: Glucose-Capillary: 143 mg/dL — ABNORMAL HIGH (ref 70–99)

## 2021-10-08 LAB — ABO/RH: ABO/RH(D): AB POS

## 2021-10-08 MED ORDER — ACETAMINOPHEN 650 MG RE SUPP: 650.0000 mg | Freq: Four times a day (QID) | RECTAL | Status: DC | PRN

## 2021-10-08 MED ORDER — SODIUM CHLORIDE 0.9% IV SOLUTION: Freq: Once | INTRAVENOUS | Status: AC

## 2021-10-08 MED ORDER — ATORVASTATIN CALCIUM 80 MG PO TABS
80.0000 mg | ORAL_TABLET | Freq: Every day | ORAL | Status: DC
  Administered 2021-10-08 – 2021-10-11 (×4): 80 mg via ORAL
  Filled 2021-10-08 (×2): qty 1
  Filled 2021-10-08: qty 2
  Filled 2021-10-08: qty 1

## 2021-10-08 MED ORDER — KETOROLAC TROMETHAMINE 15 MG/ML IJ SOLN
15.0000 mg | Freq: Four times a day (QID) | INTRAMUSCULAR | Status: DC | PRN
  Administered 2021-10-09: 15 mg via INTRAVENOUS
  Filled 2021-10-08: qty 1

## 2021-10-08 MED ORDER — IRBESARTAN 150 MG PO TABS
150.0000 mg | ORAL_TABLET | Freq: Every day | ORAL | Status: DC
  Administered 2021-10-08 – 2021-10-11 (×4): 150 mg via ORAL
  Filled 2021-10-08 (×4): qty 1

## 2021-10-08 MED ORDER — FUROSEMIDE 10 MG/ML IJ SOLN
20.0000 mg | Freq: Once | INTRAMUSCULAR | Status: AC
  Administered 2021-10-08: 20 mg via INTRAVENOUS
  Filled 2021-10-08: qty 2

## 2021-10-08 MED ORDER — LEVOTHYROXINE SODIUM 25 MCG PO TABS
125.0000 ug | ORAL_TABLET | Freq: Every day | ORAL | Status: DC
  Administered 2021-10-08 – 2021-10-11 (×4): 125 ug via ORAL
  Filled 2021-10-08 (×4): qty 1

## 2021-10-08 MED ORDER — ACETAMINOPHEN 325 MG PO TABS: 650.0000 mg | ORAL_TABLET | Freq: Four times a day (QID) | ORAL | Status: DC | PRN

## 2021-10-08 MED ORDER — IOHEXOL 300 MG/ML  SOLN
80.0000 mL | Freq: Once | INTRAMUSCULAR | Status: AC | PRN
  Administered 2021-10-08: 80 mL via INTRAVENOUS

## 2021-10-08 MED ORDER — HEPARIN SODIUM (PORCINE) 5000 UNIT/ML IJ SOLN
5000.0000 [IU] | Freq: Three times a day (TID) | INTRAMUSCULAR | Status: DC
  Administered 2021-10-08 – 2021-10-11 (×10): 5000 [IU] via SUBCUTANEOUS
  Filled 2021-10-08 (×11): qty 1

## 2021-10-08 MED ORDER — AMLODIPINE BESYLATE 10 MG PO TABS
10.0000 mg | ORAL_TABLET | Freq: Every day | ORAL | Status: DC
  Administered 2021-10-08 – 2021-10-11 (×4): 10 mg via ORAL
  Filled 2021-10-08 (×3): qty 1
  Filled 2021-10-08: qty 2

## 2021-10-08 MED ORDER — INSULIN ASPART 100 UNIT/ML IJ SOLN
0.0000 [IU] | Freq: Three times a day (TID) | INTRAMUSCULAR | Status: DC
  Administered 2021-10-08: 7 [IU] via SUBCUTANEOUS
  Administered 2021-10-09: 3 [IU] via SUBCUTANEOUS
  Administered 2021-10-10: 4 [IU] via SUBCUTANEOUS
  Administered 2021-10-10 – 2021-10-11 (×3): 3 [IU] via SUBCUTANEOUS

## 2021-10-08 MED ORDER — SODIUM CHLORIDE 0.45 % IV SOLN: INTRAVENOUS | Status: DC

## 2021-10-08 MED ORDER — NITROGLYCERIN 0.4 MG SL SUBL: 0.4000 mg | SUBLINGUAL_TABLET | SUBLINGUAL | Status: DC | PRN

## 2021-10-08 MED ORDER — SODIUM CHLORIDE 0.9 % IV SOLN: 10.0000 mL/h | Freq: Once | INTRAVENOUS | Status: DC

## 2021-10-08 NOTE — Consult Note (Addendum)
Cardiology Consultation:   Patient ID: Zachary Bowers MRN: [id number]; DOB: August 24, 1956  Admit date: 10/07/2021 Date of Consult: 10/08/2021  PCP:  Shirline Frees, MD   [facility] Providers Cardiologist:  Evalina Field, MD   Patient Profile:   Zachary Bowers is a 65 y.o. male with a hx of CAD with recent PCI and residual disease, DM2, HTN, and HLD who is being seen 10/08/2021 for the evaluation of DAPT interruption at the request of Dr. Hal Hope.  History of Present Illness:   Zachary Bowers had know nonobstructive CAD by heart cath in 2011. Abnormal CCTA lead to a heart cath that showed severe RCA disease and small vessel disease. He underwent PCI 08/11/21. At his post-hospital follow up with our team, he complained of dark stools and later complained of dizziness. He saw PCP on Monday with reduced Hb and was advised to come to the ER.   He presented to [facility] 10/07/21 with complains of RLQ pain and Hb of 7. Plavix and ASA have been held - last doses 10/07/21 AM. CT concerning for gastric mass 4.8 x 3.4 x 2.9 with hepato-gastric enlarged node worrisome for malignancy. GI was consulted and is planning for EGD. Cardiology consulted for hx of recent PCI and now with DAPT on hold.   During my interview, he reports improvement in angina, but still struggling with exertional chest tightness. CP is improved since heart cath, but still present, likely due to small vessel disease. He is actively receiving his 3rd U PRBC. He has noted an improvement in positional dizziness after receiving transfusions. He has no resting angina. He states he has had no further dark stools since he saw Zachary Bowers in clinic.    Past Medical History:  Diagnosis Date   Coronary artery disease    Diabetes mellitus without complication ([place])    Hyperlipidemia    Hypertension    Hypogonadism in male    OSA (obstructive sleep apnea)    Thyroid disease     Past Surgical History:  Procedure Laterality Date    APPENDECTOMY     APPENDECTOMY Right    CARDIAC CATHETERIZATION     CORONARY STENT INTERVENTION N/A 08/11/2021   Procedure: CORONARY STENT INTERVENTION;  Surgeon: Jettie Booze, MD;  Location: [place] [facility];  Service: Cardiovascular;  Laterality: N/A;   INTRAVASCULAR ULTRASOUND/IVUS N/A 08/11/2021   Procedure: Intravascular Ultrasound/IVUS;  Surgeon: Jettie Booze, MD;  Location: [place] [facility];  Service: Cardiovascular;  Laterality: N/A;   LEFT HEART CATH AND CORONARY ANGIOGRAPHY N/A 08/11/2021   Procedure: LEFT HEART CATH AND CORONARY ANGIOGRAPHY;  Surgeon: Jettie Booze, MD;  Location: [place] [facility];  Service: Cardiovascular;  Laterality: N/A;     Home Medications:  Prior to Admission medications   Medication Sig Start Date End Date Taking? Authorizing Provider  amLODipine (NORVASC) 10 MG tablet Take 10 mg by mouth daily. 07/06/21  Yes [provider]  aspirin 81 MG tablet Take 81 mg by mouth daily.   Yes [provider]  atorvastatin (LIPITOR) 80 MG tablet Take 1 tablet (80 mg total) by mouth daily. 08/11/21  Yes Cheryln Manly, NP  clopidogrel (PLAVIX) 75 MG tablet Take 1 tablet (75 mg total) by mouth daily with breakfast. 08/12/21  Yes Reino Bellis B, NP  glimepiride (AMARYL) 4 MG tablet Take 4 mg by mouth every morning. 05/07/21  Yes [provider]  glucose blood test strip 1 each by Other route. Check blood sugar every  morning   Yes [provider]  levothyroxine (SYNTHROID, LEVOTHROID) 125 MCG tablet Take 125 mcg by mouth daily before breakfast.  12/19/14  Yes [provider]  metFORMIN (GLUCOPHAGE) 500 MG tablet Take 500-1,000 mg by mouth See admin instructions. 1000 mg in the morning 500 mg with dinner as needed if sugar is low 04/20/21  Yes [provider]  nitroGLYCERIN (NITROSTAT) 0.4 MG SL tablet Place 1 tablet (0.4 mg total) under the tongue every 5 (five) minutes as needed for chest pain.  08/07/21 11/05/21 Yes Skeet Latch, MD  telmisartan (MICARDIS) 40 MG tablet Take 40 mg by mouth daily. 07/06/21  Yes [provider]    Inpatient Medications: Scheduled Meds:  amLODipine  10 mg Oral Daily   atorvastatin  80 mg Oral Daily   heparin  5,000 Units Subcutaneous Q8H   insulin aspart  0-20 Units Subcutaneous TID WC   irbesartan  150 mg Oral Daily   levothyroxine  125 mcg Oral Q0600   Continuous Infusions:  sodium chloride     sodium chloride     PRN Meds: acetaminophen **OR** acetaminophen, ketorolac, nitroGLYCERIN  Allergies:    Allergies  Allergen Reactions   Niaspan [Niacin Er] Itching    Social History:   Social History   Socioeconomic History   Marital status: Married    Spouse name: Not on file   Number of children: 2   Years of education: Not on file   Highest education level: Not on file  Occupational History   Occupation: Retired Works with Horses  Tobacco Use   Smoking status: Former   Smokeless tobacco: Former    Types: Camera operator and Sexual Activity   Alcohol use: No   Drug use: No   Sexual activity: Not on file  Other Topics Concern   Not on file  Social History Narrative   Not on file   Social Determinants of Health   Financial Resource Strain: Not on file  Food Insecurity: Not on file  Transportation Needs: Not on file  Physical Activity: Not on file  Stress: Not on file  Social Connections: Not on file  Intimate Partner Violence: Not on file    Family History:    Family History  Problem Relation Age of Onset   Heart disease Mother    Alzheimer's disease Father      ROS:  Please see the history of present illness.   All other ROS reviewed and negative.     Physical Exam/Data:   Vitals:   10/08/21 0936 10/08/21 0958 10/08/21 1100 10/08/21 1200  BP: 137/87 140/85 132/82 139/79  Pulse: 90 87 69 81  Resp: 20 18 16  (!) 27  Temp: 98.1 F (36.7 C) 98.2 F (36.8 C)    TempSrc: Oral Oral    SpO2: 100%  100% 97% 97%    Intake/Output Summary (Last 24 hours) at 10/08/2021 1314 Last data filed at 10/08/2021 1108 Gross per 24 hour  Intake 450 ml  Output 2250 ml  Net -1800 ml   Last 3 Weights 08/21/2021 08/11/2021 07/30/2021  Weight (lbs) 239 lb 245 lb 246 lb 6.4 oz  Weight (kg) 108.41 kg 111.131 kg 111.766 kg     There is no height or weight on file to calculate BMI.  General:  Well nourished, well developed, in no acute distress HEENT: normal Neck: no JVD Vascular: No carotid bruits; Distal pulses 2+ bilaterally Cardiac:  normal S1, S2; RRR; no murmur  Lungs:  clear to  auscultation bilaterally, no wheezing, rhonchi or rales  Abd: soft, nontender, no hepatomegaly  Ext: no edema Musculoskeletal:  No deformities, BUE and BLE strength normal and equal Skin: warm and dry  Neuro:  CNs 2-12 intact, no focal abnormalities noted Psych:  Normal affect   EKG:  The EKG was personally reviewed and demonstrates:  sinus rhythm HR 81 Telemetry:  Telemetry was personally reviewed and demonstrates:  sinus in the 80s  Relevant CV Studies:  Left heart cath 08/11/21:   Ramus lesion is 80% stenosed.   Dist Cx lesion is 75% stenosed.   Mid LAD lesion is 50% stenosed.   2nd Diag lesion is 75% stenosed.   Prox Cx lesion is 25% stenosed.   Ost RCA lesion is 25% stenosed.   Prox RCA lesion is 95% stenosed.  A drug-eluting stent was successfully placed using a STENT ONYX FRONTIER 3.5X18, postdilated to 3.75 mm and optimized with intravascular ultrasound.   Post intervention, there is a 0% residual stenosis.   Dist RCA lesion is 80% stenosed.  A drug-eluting stent was successfully placed using a STENT ONYX FRONTIER 3.5X15, postdilated to 3.75 mm and optimized by intravascular ultrasound.   A drug-eluting stent was successfully placed using a STENT ONYX FRONTIER 3.5X18.   A drug-eluting stent was successfully placed using a STENT ONYX FRONTIER 3.5X15.   Post intervention, there is a 0% residual stenosis.    The left ventricular systolic function is normal.   LV end diastolic pressure is normal.   The left ventricular ejection fraction is 55-65% by visual estimate.   There is no aortic valve stenosis.   Severe tortuosity of the right subclavian.  For intervention, an 85 cm destination sheath was used to straighten out the tortuosity to engage the RCA.  We were able to more easily engage the left main without the benefit of the long destination sheath.   Successful PCI to the proximal and distal RCA.  Given his diffuse small vessel/branch vessel disease, would strongly consider long-term clopidogrel monotherapy after 6 months of dual antiplatelet therapy.   If he has persistent angina, could address the distal circumflex or the small ramus vessel percutaneously.   Continue aggressive secondary prevention.   Echo 08/19/21:  1. Left ventricular ejection fraction, by estimation, is 60 to 65%. The  left ventricle has normal function. The left ventricle has no regional  wall motion abnormalities. Left ventricular diastolic parameters are  consistent with Grade I diastolic  dysfunction (impaired relaxation).   2. Right ventricular systolic function is normal. The right ventricular  size is normal.   3. The mitral valve is normal in structure. No evidence of mitral valve  regurgitation. No evidence of mitral stenosis.   4. The aortic valve is tricuspid. Aortic valve regurgitation is mild. No  aortic stenosis is present.   5. The inferior vena cava is normal in size with greater than 50%  respiratory variability, suggesting right atrial pressure of 3 mmHg.   Laboratory Data:  High Sensitivity Troponin:  No results for input(s): TROPONINIHS in the last 720 hours.   Chemistry Recent Labs  Lab 10/07/21 1659  NA 139  K 4.0  CL 109  CO2 24  GLUCOSE 82  BUN 8  CREATININE 1.10  CALCIUM 9.3  GFRNONAA >60  ANIONGAP 6    No results for input(s): PROT, ALBUMIN, AST, ALT, ALKPHOS, BILITOT in the  last 168 hours. Lipids No results for input(s): CHOL, TRIG, HDL, LABVLDL, LDLCALC, CHOLHDL in the last 168  hours.  Hematology Recent Labs  Lab 10/07/21 1659  WBC 7.3  RBC 3.19*  HGB 7.0*  HCT 24.1*  MCV 75.5*  MCH 21.9*  MCHC 29.0*  RDW 17.2*  PLT 486*   Thyroid No results for input(s): TSH, FREET4 in the last 168 hours.  BNPNo results for input(s): BNP, PROBNP in the last 168 hours.  DDimer No results for input(s): DDIMER in the last 168 hours.   Radiology/Studies:  DG Chest 2 View  Result Date: 10/06/2021 CLINICAL DATA:  65 year old male with history of acute onset of cough with wheezing for 1 week. EXAM: CHEST - 2 VIEW COMPARISON:  Chest x-ray 07/27/2021. FINDINGS: Lung volumes are normal. No consolidative airspace disease. No pleural effusions. No pneumothorax. No pulmonary nodule or mass noted. Pulmonary vasculature and the cardiomediastinal silhouette are within normal limits. IMPRESSION: No radiographic evidence of acute cardiopulmonary disease. Electronically Signed   By: Vinnie Langton M.D.   On: 10/06/2021 11:08   CT ABDOMEN PELVIS W CONTRAST  Result Date: 10/08/2021 CLINICAL DATA:  65 year old male with history of anemia presenting with right lower quadrant abdominal pain. EXAM: CT ABDOMEN AND PELVIS WITH CONTRAST TECHNIQUE: Multidetector CT imaging of the abdomen and pelvis was performed using the standard protocol following bolus administration of intravenous contrast. CONTRAST:  75mL OMNIPAQUE IOHEXOL 300 MG/ML  SOLN COMPARISON:  No priors. FINDINGS: Lower chest: Mild scarring in the lung bases bilaterally. Atherosclerotic calcifications in the left anterior descending, left circumflex and right coronary arteries. Hepatobiliary: No suspicious cystic or solid hepatic lesions. No intra or extrahepatic biliary ductal dilatation. Gallbladder is normal in appearance. Pancreas: No pancreatic mass. No pancreatic ductal dilatation. No pancreatic or peripancreatic fluid  collections or inflammatory changes. Spleen: Unremarkable. Adrenals/Urinary Tract: Bilateral kidneys and adrenal glands are normal in appearance. No hydroureteronephrosis. Urinary bladder is normal in appearance. Stomach/Bowel: Potential soft tissue mass in the proximal stomach (axial image 20 of series 3 and coronal image 53 of series 6) estimated to measure approximately 4.8 x 3.4 x 2.9 cm. Distal stomach is otherwise normal in appearance. No pathologic dilatation of small bowel or colon. The appendix is not confidently identified and may be surgically absent. Regardless, there are no inflammatory changes noted adjacent to the cecum to suggest the presence of an acute appendicitis at this time. Vascular/Lymphatic: Aortic atherosclerosis, without evidence of aneurysm or dissection in the abdominal or pelvic vasculature. In the upper abdomen in the region of the gastrohepatic ligament (axial image 25 of series 3 and coronal image 46 of series 6) there is a soft tissue attenuation mass measuring 2.1 x 2.7 x 2.3 cm, likely an enlarged gastrohepatic ligament lymph node. No other definite lymphadenopathy noted elsewhere in the abdomen or pelvis. Reproductive: Prostate gland and seminal vesicles are unremarkable in appearance. Other: No significant volume of ascites.  No pneumoperitoneum. Musculoskeletal: There are no aggressive appearing lytic or blastic lesions noted in the visualized portions of the skeleton. IMPRESSION: 1. No acute findings are noted to account for the patient's right lower quadrant abdominal pain. However, there is a probable mass in the proximal stomach just distal to the gastroesophageal junction. Adjacent to this in the gastrohepatic ligament there is an enlarged lymph node. Findings are concerning for possible primary gastric neoplasm with local nodal metastasis. Further evaluation with endoscopy is strongly recommended in the near future to better evaluate this finding. 2. Aortic  atherosclerosis, in addition to least 3 vessel coronary artery disease. Please note that although the presence of coronary artery calcium  documents the presence of coronary artery disease, the severity of this disease and any potential stenosis cannot be assessed on this non-gated CT examination. Assessment for potential risk factor modification, dietary therapy or pharmacologic therapy may be warranted, if clinically indicated. Electronically Signed   By: Vinnie Langton M.D.   On: 10/08/2021 05:42     Assessment and Plan:   CAD DES x 2 to mid and distal RCA - PCI on 08/11/21 - has completed less than 2 months of DAPT with ASA and plavix - given residual 75% distal CX, 80% mid ramus, and mid LAD/D2 disease, indefinite plavix therapy was recommended.  - still having exertional chest tightness, but this is improved since PCI - discussed potential for stent thrombosis  - following EGD procedure tomorrow and if Hb stable, consider starting a p2y12 inhibitor IV such as cangrelor gtt - add back ASA and plavix when able per GI   Anemia - Hb down to 7.0 from 12.9 two months ago - last dose of plavix was yesterday morning - suspect slow bleed - transfuse per primary - receivign 3rd Unit PRBC now - would aim for Hb > 8 given CAD   Gastric mass - GI planning EGD tomorrow - DAPT on hold   Hypertension - continue amlodipine and ARB - consider adding imdur vs ranexa for ongoing exertional chest discomfort   Hyperlipidemia with LDL goal < 70 - will add on lipid panel - continue 80 mg lipitor     Risk Assessment/Risk Scores:       For questions or updates, please contact [place] Please consult [email or web address] for contact info under    Signed, Ledora Bottcher, PA  10/08/2021 1:14 PM   Personally seen and examined. Agree with APP above with the following comments: Briefly 65 yo M with multi-vessel CAD s/p recent PCI who presents with acute blood loss anemia in the setting  of black stools and gastric mass Patient notes no angina as long as he does not exert himself.  No black stools for a couple of weeks.  Notes that he feels better with transfusion Exam notable for regular rhythm, normal pulses; benign cardiac exam Labs notable for Hgb last 7.0; Normal creatinine Personally reviewed relevant tests; Telemetry SR; patient has residual disease in non-PCI vessels Would recommend  - limited cardiac medication in the setting of GI bleed - Hgb goal of 8 given angina and recent PCI - when able per GI, would start back as least ASA, DAPT (ASA and plavix) if able - deferring BB for anginal in the setting of GI bleed - If no change in BP, and if resting angina, first drug would be Imdur 30 mg PO daily - if low BP, and if resting angina, first drug would be Ranexa 500 mg PO BID  Discussed with patient and family.  Rudean Haskell, MD [address], [address] [place], [place] [address] [phone]  3:10 PM

## 2021-10-08 NOTE — ED Notes (Signed)
Attempted report X1

## 2021-10-08 NOTE — H&P (View-Only) (Signed)
Referring Provider: Dr. Adella Hare Primary Care Physician:  Shirline Frees, MD Primary Gastroenterologist:  Dr. Paulita Fujita  Reason for Consultation:  symptomatic anemia, gastric mass  HPI: Zachary Bowers is a 65 y.o. male with history of hypertension, hypothyroidism, type 2 diabetes, sleep apnea presents with symptomatic anemia.   Patient has significant medical history for CAD s/p PCI to distal RCA and proximal RCA placed August 11, 2021 with Dr.Varanasi.  Patient on Plavix and low-dose aspirin.  After his cardiac intervention patient had 2 weeks of melena, has not seen any further evidence of occult bleeding since that time. Denies iron/pepto use.  He states 2 weeks ago had some epigastric pain, x 1 day.  Patient has had some coughing, throat clearing for the past 2 weeks. Patient states he has had unknown amount of weight loss since August, per note review has lost about 3 kg.  Patient denies history of reflux, dysphagia, nausea, vomiting,. Patient had progressive weakness, fatigue and some shortness of breath, denies chest pain. Has had right lower quadrant pain intermittently. Denies change in bowel habits.  Has bowel movement 1-2 times daily, no straining. Patient had colonoscopy at age 49 otherwise has not had a repeat.  Patient is a former smoker, quit 1970.  Patient denies alcohol use.  Denies recreational drug use. Patient does have a paternal uncle with history of duodenal cancer, otherwise denies family history of GI malignancies. Patient presents to ED with hemoglobin of 7, MCV 75.5. 04/10/2021 hemoglobin 13.2, MCV 81.4, 08/21/2021 hemoglobin 11.2   CT AB and Pelvis 10/08/2021 1. No acute findings are noted to account for the patient's right lower quadrant abdominal pain. However, there is a probable mass in the proximal stomach just distal to the gastroesophageal junction. Adjacent to this in the gastrohepatic ligament there is an enlarged lymph node. Findings are  concerning for possible primary gastric neoplasm with local nodal metastasis. Further evaluation with endoscopy is strongly recommended in the near future to better evaluate this finding. 2. Aortic atherosclerosis, in addition to least 3 vessel coronary artery disease. Please note that although the presence of coronary artery calcium documents the presence of coronary artery disease, the severity of this disease and any potential stenosis cannot be assessed on this non-gated CT examination. Assessment for potential risk factor modification, dietary therapy or pharmacologic therapy may be warranted, if clinically indicated.  Past Medical History:  Diagnosis Date   Coronary artery disease    Diabetes mellitus without complication ([place])    Hyperlipidemia    Hypertension    Hypogonadism in male    OSA (obstructive sleep apnea)    Thyroid disease     Past Surgical History:  Procedure Laterality Date   APPENDECTOMY     APPENDECTOMY Right    CARDIAC CATHETERIZATION     CORONARY STENT INTERVENTION N/A 08/11/2021   Procedure: CORONARY STENT INTERVENTION;  Surgeon: Jettie Booze, MD;  Location: [facility];  Service: Cardiovascular;  Laterality: N/A;   INTRAVASCULAR ULTRASOUND/IVUS N/A 08/11/2021   Procedure: Intravascular Ultrasound/IVUS;  Surgeon: Jettie Booze, MD;  Location: [facility];  Service: Cardiovascular;  Laterality: N/A;   LEFT HEART CATH AND CORONARY ANGIOGRAPHY N/A 08/11/2021   Procedure: LEFT HEART CATH AND CORONARY ANGIOGRAPHY;  Surgeon: Jettie Booze, MD;  Location: [facility];  Service: Cardiovascular;  Laterality: N/A;    Prior to Admission medications   Medication Sig Start Date End Date Taking? Authorizing Provider  amLODipine (NORVASC) 10 MG tablet Take 10  mg by mouth daily. 07/06/21  Yes [provider]  aspirin 81 MG tablet Take 81 mg by mouth daily.   Yes [provider]  atorvastatin (LIPITOR) 80 MG  tablet Take 1 tablet (80 mg total) by mouth daily. 08/11/21  Yes Cheryln Manly, NP  clopidogrel (PLAVIX) 75 MG tablet Take 1 tablet (75 mg total) by mouth daily with breakfast. 08/12/21  Yes Reino Bellis B, NP  glimepiride (AMARYL) 4 MG tablet Take 4 mg by mouth every morning. 05/07/21  Yes [provider]  glucose blood test strip 1 each by Other route. Check blood sugar every morning   Yes [provider]  levothyroxine (SYNTHROID, LEVOTHROID) 125 MCG tablet Take 125 mcg by mouth daily before breakfast.  12/19/14  Yes [provider]  metFORMIN (GLUCOPHAGE) 500 MG tablet Take 500-1,000 mg by mouth See admin instructions. 1000 mg in the morning 500 mg with dinner as needed if sugar is low 04/20/21  Yes [provider]  nitroGLYCERIN (NITROSTAT) 0.4 MG SL tablet Place 1 tablet (0.4 mg total) under the tongue every 5 (five) minutes as needed for chest pain. 08/07/21 11/05/21 Yes Skeet Latch, MD  telmisartan (MICARDIS) 40 MG tablet Take 40 mg by mouth daily. 07/06/21  Yes [provider]    Scheduled Meds:  sodium chloride   Intravenous Once   amLODipine  10 mg Oral Daily   atorvastatin  80 mg Oral Daily   heparin  5,000 Units Subcutaneous Q8H   insulin aspart  0-20 Units Subcutaneous TID WC   irbesartan  150 mg Oral Daily   levothyroxine  125 mcg Oral Q0600   Continuous Infusions:  sodium chloride     sodium chloride     PRN Meds:.acetaminophen **OR** acetaminophen, ketorolac, nitroGLYCERIN  Allergies as of 10/07/2021 - Review Complete 10/07/2021  Allergen Reaction Noted   Niaspan [niacin er] Itching 10/11/2013    Family History  Problem Relation Age of Onset   Heart disease Mother    Alzheimer's disease Father     Social History   Socioeconomic History   Marital status: Married    Spouse name: Not on file   Number of children: 2   Years of education: Not on file   Highest education level: Not on file  Occupational History    Occupation: Retired Works with Horses  Tobacco Use   Smoking status: Former   Smokeless tobacco: Former    Types: Chew  Substance and Sexual Activity   Alcohol use: No   Drug use: No   Sexual activity: Not on file  Other Topics Concern   Not on file  Social History Narrative   Not on file   Social Determinants of Health   Financial Resource Strain: Not on file  Food Insecurity: Not on file  Transportation Needs: Not on file  Physical Activity: Not on file  Stress: Not on file  Social Connections: Not on file  Intimate Partner Violence: Not on file    Review of Systems:  Review of Systems  Constitutional:  Positive for malaise/fatigue and weight loss. Negative for chills and fever.  HENT:  Negative for sore throat.        Throat clearing  Respiratory:  Positive for cough and shortness of breath. Negative for sputum production and wheezing.   Cardiovascular:  Negative for chest pain and leg swelling.  Gastrointestinal:  Positive for abdominal pain and melena (for 2 weeks after PCI). Negative for blood in stool, constipation, diarrhea, heartburn,  nausea and vomiting.  Musculoskeletal:  Negative for falls.  Skin:  Negative for rash.  Neurological:  Negative for loss of consciousness.  Psychiatric/Behavioral:  Negative for memory loss.     Physical Exam: Vital signs: Vitals:   10/08/21 0630 10/08/21 0800  BP: 138/82 (!) 144/88  Pulse: 79 79  Resp: (!) 22 (!) 23  Temp:    SpO2: 98% 100%     Physical Exam Constitutional:      General: He is not in acute distress.    Appearance: Normal appearance. He is obese.  Eyes:     General: No scleral icterus.    Comments: Pale conjunctivae  Cardiovascular:     Rate and Rhythm: Normal rate and regular rhythm.     Comments: Loud S2 versus systolic click Pulmonary:     Effort: Pulmonary effort is normal.     Breath sounds: Normal breath sounds.  Abdominal:     General: Abdomen is protuberant. Bowel sounds are normal.  There is no distension.     Palpations: Abdomen is soft.     Tenderness: There is abdominal tenderness in the right lower quadrant. There is no guarding or rebound.  Musculoskeletal:        General: Normal range of motion.     Cervical back: Normal range of motion and neck supple.     Right lower leg: No edema.     Left lower leg: No edema.  Lymphadenopathy:     Cervical: No cervical adenopathy.  Skin:    Coloration: Skin is not jaundiced.  Neurological:     General: No focal deficit present.     Mental Status: He is alert and oriented to person, place, and time.  Psychiatric:        Mood and Affect: Mood normal.        Behavior: Behavior normal.     GI:  Lab Results: Recent Labs    10/07/21 1659  WBC 7.3  HGB 7.0*  HCT 24.1*  PLT 486*   BMET Recent Labs    10/07/21 1659  NA 139  K 4.0  CL 109  CO2 24  GLUCOSE 82  BUN 8  CREATININE 1.10  CALCIUM 9.3   LFT No results for input(s): PROT, ALBUMIN, AST, ALT, ALKPHOS, BILITOT, BILIDIR, IBILI in the last 72 hours. PT/INR No results for input(s): LABPROT, INR in the last 72 hours.  Studies/Results: CT ABDOMEN PELVIS W CONTRAST  Result Date: 10/08/2021 CLINICAL DATA:  65 year old male with history of anemia presenting with right lower quadrant abdominal pain. EXAM: CT ABDOMEN AND PELVIS WITH CONTRAST TECHNIQUE: Multidetector CT imaging of the abdomen and pelvis was performed using the standard protocol following bolus administration of intravenous contrast. CONTRAST:  37mL OMNIPAQUE IOHEXOL 300 MG/ML  SOLN COMPARISON:  No priors. FINDINGS: Lower chest: Mild scarring in the lung bases bilaterally. Atherosclerotic calcifications in the left anterior descending, left circumflex and right coronary arteries. Hepatobiliary: No suspicious cystic or solid hepatic lesions. No intra or extrahepatic biliary ductal dilatation. Gallbladder is normal in appearance. Pancreas: No pancreatic mass. No pancreatic ductal dilatation. No  pancreatic or peripancreatic fluid collections or inflammatory changes. Spleen: Unremarkable. Adrenals/Urinary Tract: Bilateral kidneys and adrenal glands are normal in appearance. No hydroureteronephrosis. Urinary bladder is normal in appearance. Stomach/Bowel: Potential soft tissue mass in the proximal stomach (axial image 20 of series 3 and coronal image 53 of series 6) estimated to measure approximately 4.8 x 3.4 x 2.9 cm. Distal stomach is otherwise normal in appearance.  No pathologic dilatation of small bowel or colon. The appendix is not confidently identified and may be surgically absent. Regardless, there are no inflammatory changes noted adjacent to the cecum to suggest the presence of an acute appendicitis at this time. Vascular/Lymphatic: Aortic atherosclerosis, without evidence of aneurysm or dissection in the abdominal or pelvic vasculature. In the upper abdomen in the region of the gastrohepatic ligament (axial image 25 of series 3 and coronal image 46 of series 6) there is a soft tissue attenuation mass measuring 2.1 x 2.7 x 2.3 cm, likely an enlarged gastrohepatic ligament lymph node. No other definite lymphadenopathy noted elsewhere in the abdomen or pelvis. Reproductive: Prostate gland and seminal vesicles are unremarkable in appearance. Other: No significant volume of ascites.  No pneumoperitoneum. Musculoskeletal: There are no aggressive appearing lytic or blastic lesions noted in the visualized portions of the skeleton. IMPRESSION: 1. No acute findings are noted to account for the patient's right lower quadrant abdominal pain. However, there is a probable mass in the proximal stomach just distal to the gastroesophageal junction. Adjacent to this in the gastrohepatic ligament there is an enlarged lymph node. Findings are concerning for possible primary gastric neoplasm with local nodal metastasis. Further evaluation with endoscopy is strongly recommended in the near future to better evaluate  this finding. 2. Aortic atherosclerosis, in addition to least 3 vessel coronary artery disease. Please note that although the presence of coronary artery calcium documents the presence of coronary artery disease, the severity of this disease and any potential stenosis cannot be assessed on this non-gated CT examination. Assessment for potential risk factor modification, dietary therapy or pharmacologic therapy may be warranted, if clinically indicated. Electronically Signed   By: Vinnie Langton M.D.   On: 10/08/2021 05:42    Impression and Plan Symptomatic anemia with 2 week history of Melena after initiation of plavix CAD s/p PCI x 2 07/2021 with plavix and bASA use Probable mass in proximal stomach just distal to the gastroesophageal junction  Patient is on blood thinners, last dose plavix yesterday AM HGB 7.0 MCV 75.5 Platelets 486  Hemoglobin in April around 13, with drop to 11 after PCI. BUN 7.0 Cr 1.10- no elevation in BUN at this time GFR >60   Cardiology was consulted by hospitalist.  Continue to hold aspirin and Plavix.  Plan for EGD tomorrow. I thoroughly discussed the procedure to include nature, alternatives, benefits, and risks including but not limited to bleeding, perforation, infection, anesthesia/cardiac and pulmonary complications. Patient provides understanding and gave verbal consent to proceed.   Protonix 40 mg IV BID.   Clear liquid diet, NPO at midnight.   Continue daily CBC with transfusion as needed to maintain Hgb >7.     [facility] will follow.   LOS: 0 days   Vladimir Crofts  PA-C 10/08/2021, 9:01 AM  Contact #  [phone]

## 2021-10-08 NOTE — H&P (Signed)
History and Physical    Zachary Bowers HGD:[id number] DOB: [birth year] DOA: 10/07/2021  PCP: Shirline Frees, MD   Patient coming from: [facility]  I have personally briefly reviewed patient's old medical records in [place]  Chief Complaint: weakness, low Hgb per lab at PCP office  HPI: Zachary Bowers is a 65 y.o. male with medical history significant of CAD/MI s/p  PCI w/ stents, HLD, DM, HTN who reports two months of progressive weakness and fatigue. He was seen by his PCP Monday, October 24th. Lab revealed significant anemia. He also reports a very recent h/o abdominal pain. He was advised to be evaluated in ED for anemia.    ED Course: T 97.9  130/82  HR 79  RR 22. Lab with nl Bmet, Hgb 7, Plt 486 He was typed and cross-matched. CT abdomen revealed a gastric mass 4.8x3.4x2.9 and a gastro-hepatic lymphnode 2.1x2.7x2.3. TRH called to admit for further management and treatment.   Review of Systems: As per HPI otherwise 10 point review of systems negative.    Past Medical History:  Diagnosis Date   Coronary artery disease    Diabetes mellitus without complication ([place])    Hyperlipidemia    Hypertension    Hypogonadism in male    OSA (obstructive sleep apnea)    Thyroid disease     Past Surgical History:  Procedure Laterality Date   APPENDECTOMY     APPENDECTOMY Right    CARDIAC CATHETERIZATION     CORONARY STENT INTERVENTION N/A 08/11/2021   Procedure: CORONARY STENT INTERVENTION;  Surgeon: Jettie Booze, MD;  Location: [facility];  Service: Cardiovascular;  Laterality: N/A;   INTRAVASCULAR ULTRASOUND/IVUS N/A 08/11/2021   Procedure: Intravascular Ultrasound/IVUS;  Surgeon: Jettie Booze, MD;  Location: [facility];  Service: Cardiovascular;  Laterality: N/A;   LEFT HEART CATH AND CORONARY ANGIOGRAPHY N/A 08/11/2021   Procedure: LEFT HEART CATH AND CORONARY ANGIOGRAPHY;  Surgeon: Jettie Booze, MD;  Location: [facility];  Service:  Cardiovascular;  Laterality: N/A;    Soc Hx - married 38 years. He has two sons, one daughter, 3 grands. 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 Marriage in good health.    reports that he has quit smoking. He has quit using smokeless tobacco.  His smokeless tobacco use included chew. He reports that he does not drink alcohol and does not use drugs.  Allergies  Allergen Reactions   Niaspan [Niacin Er] Itching    Family History  Problem Relation Age of Onset   Heart disease Mother    Alzheimer's disease Father      Prior to Admission medications   Medication Sig Start Date End Date Taking? Authorizing Provider  amLODipine (NORVASC) 10 MG tablet Take 10 mg by mouth daily. 07/06/21  Yes [provider]  aspirin 81 MG tablet Take 81 mg by mouth daily.   Yes [provider]  atorvastatin (LIPITOR) 80 MG tablet Take 1 tablet (80 mg total) by mouth daily. 08/11/21  Yes Cheryln Manly, NP  clopidogrel (PLAVIX) 75 MG tablet Take 1 tablet (75 mg total) by mouth daily with breakfast. 08/12/21  Yes Reino Bellis B, NP  glimepiride (AMARYL) 4 MG tablet Take 4 mg by mouth every morning. 05/07/21  Yes [provider]  glucose blood test strip 1 each by Other route. Check blood sugar every morning   Yes [provider]  levothyroxine (SYNTHROID, LEVOTHROID) 125 MCG tablet Take 125 mcg by mouth daily before breakfast.  12/19/14  Yes [provider]  metFORMIN (GLUCOPHAGE) 500 MG tablet Take 500-1,000 mg by mouth See admin instructions. 1000 mg in the morning 500 mg with dinner as needed if sugar is low 04/20/21  Yes [provider]  nitroGLYCERIN (NITROSTAT) 0.4 MG SL tablet Place 1 tablet (0.4 mg total) under the tongue every 5 (five) minutes as needed for chest pain. 08/07/21 11/05/21 Yes Skeet Latch, MD  telmisartan (MICARDIS) 40 MG tablet Take 40 mg by mouth daily. 07/06/21   Yes [provider]    Physical Exam: Vitals:   10/08/21 0450 10/08/21 0500 10/08/21 0600 10/08/21 0630  BP: (!) 142/90 140/89 133/83 138/82  Pulse: 74 76 79 79  Resp: 19 (!) 22 20 (!) 22  Temp: 97.9 F (36.6 C)     TempSrc: Oral     SpO2: 100% 99% 99% 98%     Vitals:   10/08/21 0450 10/08/21 0500 10/08/21 0600 10/08/21 0630  BP: (!) 142/90 140/89 133/83 138/82  Pulse: 74 76 79 79  Resp: 19 (!) 22 20 (!) 22  Temp: 97.9 F (36.6 C)     TempSrc: Oral     SpO2: 100% 99% 99% 98%   General: Overweight man in no distress Eyes: PERRL, lids and conjunctivae normal ENMT: Mucous membranes are moist. Posterior pharynx clear of any exudate or lesions.Normal dentition.  Neck: normal, supple, no masses, no thyromegaly Respiratory: clear to auscultation bilaterally, no wheezing, no crackles. Normal respiratory effort. No accessory muscle use.  Cardiovascular: Regular rate and rhythm, no murmurs / rubs / gallops. No extremity edema. 2+ pedal pulses. No carotid bruits.  Abdomen: no tenderness to deep palpation, no masses palpated. No hepatosplenomegaly. Bowel sounds positive.  Musculoskeletal: no clubbing / cyanosis. No joint deformity upper and lower extremities. Good ROM, no contractures. Normal muscle tone.  Skin: no rashes, lesions, ulcers. No induration Neurologic: CN 2-12 grossly intact. Sensation intact, DTR normal. Strength 5/5 in all 4.  Psychiatric: Normal judgment and insight. Alert and oriented x 3. Normal mood.     Labs on Admission: I have personally reviewed following labs and imaging studies  CBC: Recent Labs  Lab 10/07/21 1659  WBC 7.3  HGB 7.0*  HCT 24.1*  MCV 75.5*  PLT 937*   Basic Metabolic Panel: Recent Labs  Lab 10/07/21 1659  NA 139  K 4.0  CL 109  CO2 24  GLUCOSE 82  BUN 8  CREATININE 1.10  CALCIUM 9.3   GFR: CrCl cannot be calculated (Unknown ideal weight.). Liver Function Tests: No results for input(s): AST, ALT, ALKPHOS, BILITOT,  PROT, ALBUMIN in the last 168 hours. No results for input(s): LIPASE, AMYLASE in the last 168 hours. No results for input(s): AMMONIA in the last 168 hours. Coagulation Profile: No results for input(s): INR, PROTIME in the last 168 hours. Cardiac Enzymes: No results for input(s): CKTOTAL, CKMB, CKMBINDEX, TROPONINI in the last 168 hours. BNP (last 3 results) No results for input(s): PROBNP in the last 8760 hours. HbA1C: No results for input(s): HGBA1C in the last 72 hours. CBG: No results for input(s): GLUCAP in the last 168 hours. Lipid Profile: No results for input(s): CHOL, HDL, LDLCALC, TRIG, CHOLHDL, LDLDIRECT in the last 72 hours. Thyroid Function Tests: No results for input(s): TSH, T4TOTAL, FREET4, T3FREE, THYROIDAB in the last 72 hours. Anemia Panel: No results for input(s): VITAMINB12, FOLATE, FERRITIN, TIBC, IRON, RETICCTPCT in the  last 72 hours. Urine analysis:    Component Value Date/Time   COLORURINE YELLOW 10/07/2021 1659   APPEARANCEUR CLEAR 10/07/2021 1659   LABSPEC 1.019 10/07/2021 1659   PHURINE 6.0 10/07/2021 1659   GLUCOSEU NEGATIVE 10/07/2021 1659   HGBUR NEGATIVE 10/07/2021 1659   BILIRUBINUR NEGATIVE 10/07/2021 1659   KETONESUR NEGATIVE 10/07/2021 1659   PROTEINUR NEGATIVE 10/07/2021 1659   NITRITE NEGATIVE 10/07/2021 1659   LEUKOCYTESUR NEGATIVE 10/07/2021 1659    Radiological Exams on Admission: CT ABDOMEN PELVIS W CONTRAST  Result Date: 10/08/2021 CLINICAL DATA:  65 year old male with history of anemia presenting with right lower quadrant abdominal pain. EXAM: CT ABDOMEN AND PELVIS WITH CONTRAST TECHNIQUE: Multidetector CT imaging of the abdomen and pelvis was performed using the standard protocol following bolus administration of intravenous contrast. CONTRAST:  74mL OMNIPAQUE IOHEXOL 300 MG/ML  SOLN COMPARISON:  No priors. FINDINGS: Lower chest: Mild scarring in the lung bases bilaterally. Atherosclerotic calcifications in the left anterior  descending, left circumflex and right coronary arteries. Hepatobiliary: No suspicious cystic or solid hepatic lesions. No intra or extrahepatic biliary ductal dilatation. Gallbladder is normal in appearance. Pancreas: No pancreatic mass. No pancreatic ductal dilatation. No pancreatic or peripancreatic fluid collections or inflammatory changes. Spleen: Unremarkable. Adrenals/Urinary Tract: Bilateral kidneys and adrenal glands are normal in appearance. No hydroureteronephrosis. Urinary bladder is normal in appearance. Stomach/Bowel: Potential soft tissue mass in the proximal stomach (axial image 20 of series 3 and coronal image 53 of series 6) estimated to measure approximately 4.8 x 3.4 x 2.9 cm. Distal stomach is otherwise normal in appearance. No pathologic dilatation of small bowel or colon. The appendix is not confidently identified and may be surgically absent. Regardless, there are no inflammatory changes noted adjacent to the cecum to suggest the presence of an acute appendicitis at this time. Vascular/Lymphatic: Aortic atherosclerosis, without evidence of aneurysm or dissection in the abdominal or pelvic vasculature. In the upper abdomen in the region of the gastrohepatic ligament (axial image 25 of series 3 and coronal image 46 of series 6) there is a soft tissue attenuation mass measuring 2.1 x 2.7 x 2.3 cm, likely an enlarged gastrohepatic ligament lymph node. No other definite lymphadenopathy noted elsewhere in the abdomen or pelvis. Reproductive: Prostate gland and seminal vesicles are unremarkable in appearance. Other: No significant volume of ascites.  No pneumoperitoneum. Musculoskeletal: There are no aggressive appearing lytic or blastic lesions noted in the visualized portions of the skeleton. IMPRESSION: 1. No acute findings are noted to account for the patient's right lower quadrant abdominal pain. However, there is a probable mass in the proximal stomach just distal to the gastroesophageal  junction. Adjacent to this in the gastrohepatic ligament there is an enlarged lymph node. Findings are concerning for possible primary gastric neoplasm with local nodal metastasis. Further evaluation with endoscopy is strongly recommended in the near future to better evaluate this finding. 2. Aortic atherosclerosis, in addition to least 3 vessel coronary artery disease. Please note that although the presence of coronary artery calcium documents the presence of coronary artery disease, the severity of this disease and any potential stenosis cannot be assessed on this non-gated CT examination. Assessment for potential risk factor modification, dietary therapy or pharmacologic therapy may be warranted, if clinically indicated. Electronically Signed   By: Vinnie Langton M.D.   On: 10/08/2021 05:42    EKG: Independently reviewed. NSR w/o acute changes  Assessment/Plan Active Problems:   Pure hypercholesterolemia   Essential hypertension, benign   Symptomatic anemia  Gastric mass  Anemia - patient with Hgb 7. Most likely a slow bleed over time from gastric mass. He denies chest pain, SOB but admits to weakness. Denies hemetemesis, melena, hematochezia. Has no recent GI history but was seen in the past. He has CAD s/p PCI August 30th Plan Hold ASA, Plavix  Transfuse two units of blood - symptomatic and cardiac patient - goal Hgb 9  F/u H/H  Lasix 20 mg IV between units  2. Gastric mass - on CT mass is 4.8x3.4x2.9 with a hepato-gastic enlarged node. Picture worrisome for malignancy. Plan GI consult for EGD/biospy  3. HTN- continue home meds  4. Cardiology - asymptomatic re: cardiac symptoms. EKG nl.  Plan Cardiology consult since ASA and Plavix on hold.   DVT prophylaxis: heparin SQ  Code Status: full code  Family Communication: Spoke with Mrs. Saralyn Pilar - she understands dx and plan.  Disposition Plan: home when medically stable  Consults called: GI- Dr. Lala Lund; Cardiology - [facility]   Admission status: inpatient    Adella Hare MD [facility] Pager [phone]  If 7PM-7AM, please contact night-coverage [email or web address] Password [facility]  10/08/2021, 7:38 AM   [facility]

## 2021-10-08 NOTE — ED Notes (Signed)
GI Provider at bedside. 

## 2021-10-08 NOTE — Consult Note (Addendum)
Referring Provider: Dr. Adella Hare Primary Care Physician:  Shirline Frees, MD Primary Gastroenterologist:  Dr. Paulita Fujita  Reason for Consultation:  symptomatic anemia, gastric mass  HPI: Zachary Bowers is a 65 y.o. male with history of hypertension, hypothyroidism, type 2 diabetes, sleep apnea presents with symptomatic anemia.   Patient has significant medical history for CAD s/p PCI to distal RCA and proximal RCA placed August 11, 2021 with Dr.Varanasi.  Patient on Plavix and low-dose aspirin.  After his cardiac intervention patient had 2 weeks of melena, has not seen any further evidence of occult bleeding since that time. Denies iron/pepto use.  He states 2 weeks ago had some epigastric pain, x 1 day.  Patient has had some coughing, throat clearing for the past 2 weeks. Patient states he has had unknown amount of weight loss since August, per note review has lost about 3 kg.  Patient denies history of reflux, dysphagia, nausea, vomiting,. Patient had progressive weakness, fatigue and some shortness of breath, denies chest pain. Has had right lower quadrant pain intermittently. Denies change in bowel habits.  Has bowel movement 1-2 times daily, no straining. Patient had colonoscopy at age 57 otherwise has not had a repeat.  Patient is a former smoker, quit 1970.  Patient denies alcohol use.  Denies recreational drug use. Patient does have a paternal uncle with history of duodenal cancer, otherwise denies family history of GI malignancies. Patient presents to ED with hemoglobin of 7, MCV 75.5. 04/10/2021 hemoglobin 13.2, MCV 81.4, 08/21/2021 hemoglobin 11.2   CT AB and Pelvis 10/08/2021 1. No acute findings are noted to account for the patient's right lower quadrant abdominal pain. However, there is a probable mass in the proximal stomach just distal to the gastroesophageal junction. Adjacent to this in the gastrohepatic ligament there is an enlarged lymph node. Findings are  concerning for possible primary gastric neoplasm with local nodal metastasis. Further evaluation with endoscopy is strongly recommended in the near future to better evaluate this finding. 2. Aortic atherosclerosis, in addition to least 3 vessel coronary artery disease. Please note that although the presence of coronary artery calcium documents the presence of coronary artery disease, the severity of this disease and any potential stenosis cannot be assessed on this non-gated CT examination. Assessment for potential risk factor modification, dietary therapy or pharmacologic therapy may be warranted, if clinically indicated.  Past Medical History:  Diagnosis Date   Coronary artery disease    Diabetes mellitus without complication ([place])    Hyperlipidemia    Hypertension    Hypogonadism in male    OSA (obstructive sleep apnea)    Thyroid disease     Past Surgical History:  Procedure Laterality Date   APPENDECTOMY     APPENDECTOMY Right    CARDIAC CATHETERIZATION     CORONARY STENT INTERVENTION N/A 08/11/2021   Procedure: CORONARY STENT INTERVENTION;  Surgeon: Jettie Booze, MD;  Location: [place] [facility];  Service: Cardiovascular;  Laterality: N/A;   INTRAVASCULAR ULTRASOUND/IVUS N/A 08/11/2021   Procedure: Intravascular Ultrasound/IVUS;  Surgeon: Jettie Booze, MD;  Location: [place] [facility];  Service: Cardiovascular;  Laterality: N/A;   LEFT HEART CATH AND CORONARY ANGIOGRAPHY N/A 08/11/2021   Procedure: LEFT HEART CATH AND CORONARY ANGIOGRAPHY;  Surgeon: Jettie Booze, MD;  Location: [place] [facility];  Service: Cardiovascular;  Laterality: N/A;    Prior to Admission medications   Medication Sig Start Date End Date Taking? Authorizing Provider  amLODipine (NORVASC) 10 MG tablet Take 10  mg by mouth daily. 07/06/21  Yes [provider]  aspirin 81 MG tablet Take 81 mg by mouth daily.   Yes [provider]  atorvastatin (LIPITOR) 80 MG  tablet Take 1 tablet (80 mg total) by mouth daily. 08/11/21  Yes Cheryln Manly, NP  clopidogrel (PLAVIX) 75 MG tablet Take 1 tablet (75 mg total) by mouth daily with breakfast. 08/12/21  Yes Reino Bellis B, NP  glimepiride (AMARYL) 4 MG tablet Take 4 mg by mouth every morning. 05/07/21  Yes [provider]  glucose blood test strip 1 each by Other route. Check blood sugar every morning   Yes [provider]  levothyroxine (SYNTHROID, LEVOTHROID) 125 MCG tablet Take 125 mcg by mouth daily before breakfast.  12/19/14  Yes [provider]  metFORMIN (GLUCOPHAGE) 500 MG tablet Take 500-1,000 mg by mouth See admin instructions. 1000 mg in the morning 500 mg with dinner as needed if sugar is low 04/20/21  Yes [provider]  nitroGLYCERIN (NITROSTAT) 0.4 MG SL tablet Place 1 tablet (0.4 mg total) under the tongue every 5 (five) minutes as needed for chest pain. 08/07/21 11/05/21 Yes Skeet Latch, MD  telmisartan (MICARDIS) 40 MG tablet Take 40 mg by mouth daily. 07/06/21  Yes [provider]    Scheduled Meds:  sodium chloride   Intravenous Once   amLODipine  10 mg Oral Daily   atorvastatin  80 mg Oral Daily   heparin  5,000 Units Subcutaneous Q8H   insulin aspart  0-20 Units Subcutaneous TID WC   irbesartan  150 mg Oral Daily   levothyroxine  125 mcg Oral Q0600   Continuous Infusions:  sodium chloride     sodium chloride     PRN Meds:.acetaminophen **OR** acetaminophen, ketorolac, nitroGLYCERIN  Allergies as of 10/07/2021 - Review Complete 10/07/2021  Allergen Reaction Noted   Niaspan [niacin er] Itching 10/11/2013    Family History  Problem Relation Age of Onset   Heart disease Mother    Alzheimer's disease Father     Social History   Socioeconomic History   Marital status: Married    Spouse name: Not on file   Number of children: 2   Years of education: Not on file   Highest education level: Not on file  Occupational History    Occupation: Retired Works with Horses  Tobacco Use   Smoking status: Former   Smokeless tobacco: Former    Types: Chew  Substance and Sexual Activity   Alcohol use: No   Drug use: No   Sexual activity: Not on file  Other Topics Concern   Not on file  Social History Narrative   Not on file   Social Determinants of Health   Financial Resource Strain: Not on file  Food Insecurity: Not on file  Transportation Needs: Not on file  Physical Activity: Not on file  Stress: Not on file  Social Connections: Not on file  Intimate Partner Violence: Not on file    Review of Systems:  Review of Systems  Constitutional:  Positive for malaise/fatigue and weight loss. Negative for chills and fever.  HENT:  Negative for sore throat.        Throat clearing  Respiratory:  Positive for cough and shortness of breath. Negative for sputum production and wheezing.   Cardiovascular:  Negative for chest pain and leg swelling.  Gastrointestinal:  Positive for abdominal pain and melena (for 2 weeks after PCI). Negative for blood in stool, constipation, diarrhea, heartburn,  nausea and vomiting.  Musculoskeletal:  Negative for falls.  Skin:  Negative for rash.  Neurological:  Negative for loss of consciousness.  Psychiatric/Behavioral:  Negative for memory loss.     Physical Exam: Vital signs: Vitals:   10/08/21 0630 10/08/21 0800  BP: 138/82 (!) 144/88  Pulse: 79 79  Resp: (!) 22 (!) 23  Temp:    SpO2: 98% 100%     Physical Exam Constitutional:      General: He is not in acute distress.    Appearance: Normal appearance. He is obese.  Eyes:     General: No scleral icterus.    Comments: Pale conjunctivae  Cardiovascular:     Rate and Rhythm: Normal rate and regular rhythm.     Comments: Loud S2 versus systolic click Pulmonary:     Effort: Pulmonary effort is normal.     Breath sounds: Normal breath sounds.  Abdominal:     General: Abdomen is protuberant. Bowel sounds are normal.  There is no distension.     Palpations: Abdomen is soft.     Tenderness: There is abdominal tenderness in the right lower quadrant. There is no guarding or rebound.  Musculoskeletal:        General: Normal range of motion.     Cervical back: Normal range of motion and neck supple.     Right lower leg: No edema.     Left lower leg: No edema.  Lymphadenopathy:     Cervical: No cervical adenopathy.  Skin:    Coloration: Skin is not jaundiced.  Neurological:     General: No focal deficit present.     Mental Status: He is alert and oriented to person, place, and time.  Psychiatric:        Mood and Affect: Mood normal.        Behavior: Behavior normal.     GI:  Lab Results: Recent Labs    10/07/21 1659  WBC 7.3  HGB 7.0*  HCT 24.1*  PLT 486*   BMET Recent Labs    10/07/21 1659  NA 139  K 4.0  CL 109  CO2 24  GLUCOSE 82  BUN 8  CREATININE 1.10  CALCIUM 9.3   LFT No results for input(s): PROT, ALBUMIN, AST, ALT, ALKPHOS, BILITOT, BILIDIR, IBILI in the last 72 hours. PT/INR No results for input(s): LABPROT, INR in the last 72 hours.  Studies/Results: CT ABDOMEN PELVIS W CONTRAST  Result Date: 10/08/2021 CLINICAL DATA:  65 year old male with history of anemia presenting with right lower quadrant abdominal pain. EXAM: CT ABDOMEN AND PELVIS WITH CONTRAST TECHNIQUE: Multidetector CT imaging of the abdomen and pelvis was performed using the standard protocol following bolus administration of intravenous contrast. CONTRAST:  12mL OMNIPAQUE IOHEXOL 300 MG/ML  SOLN COMPARISON:  No priors. FINDINGS: Lower chest: Mild scarring in the lung bases bilaterally. Atherosclerotic calcifications in the left anterior descending, left circumflex and right coronary arteries. Hepatobiliary: No suspicious cystic or solid hepatic lesions. No intra or extrahepatic biliary ductal dilatation. Gallbladder is normal in appearance. Pancreas: No pancreatic mass. No pancreatic ductal dilatation. No  pancreatic or peripancreatic fluid collections or inflammatory changes. Spleen: Unremarkable. Adrenals/Urinary Tract: Bilateral kidneys and adrenal glands are normal in appearance. No hydroureteronephrosis. Urinary bladder is normal in appearance. Stomach/Bowel: Potential soft tissue mass in the proximal stomach (axial image 20 of series 3 and coronal image 53 of series 6) estimated to measure approximately 4.8 x 3.4 x 2.9 cm. Distal stomach is otherwise normal in appearance.  No pathologic dilatation of small bowel or colon. The appendix is not confidently identified and may be surgically absent. Regardless, there are no inflammatory changes noted adjacent to the cecum to suggest the presence of an acute appendicitis at this time. Vascular/Lymphatic: Aortic atherosclerosis, without evidence of aneurysm or dissection in the abdominal or pelvic vasculature. In the upper abdomen in the region of the gastrohepatic ligament (axial image 25 of series 3 and coronal image 46 of series 6) there is a soft tissue attenuation mass measuring 2.1 x 2.7 x 2.3 cm, likely an enlarged gastrohepatic ligament lymph node. No other definite lymphadenopathy noted elsewhere in the abdomen or pelvis. Reproductive: Prostate gland and seminal vesicles are unremarkable in appearance. Other: No significant volume of ascites.  No pneumoperitoneum. Musculoskeletal: There are no aggressive appearing lytic or blastic lesions noted in the visualized portions of the skeleton. IMPRESSION: 1. No acute findings are noted to account for the patient's right lower quadrant abdominal pain. However, there is a probable mass in the proximal stomach just distal to the gastroesophageal junction. Adjacent to this in the gastrohepatic ligament there is an enlarged lymph node. Findings are concerning for possible primary gastric neoplasm with local nodal metastasis. Further evaluation with endoscopy is strongly recommended in the near future to better evaluate  this finding. 2. Aortic atherosclerosis, in addition to least 3 vessel coronary artery disease. Please note that although the presence of coronary artery calcium documents the presence of coronary artery disease, the severity of this disease and any potential stenosis cannot be assessed on this non-gated CT examination. Assessment for potential risk factor modification, dietary therapy or pharmacologic therapy may be warranted, if clinically indicated. Electronically Signed   By: Vinnie Langton M.D.   On: 10/08/2021 05:42    Impression and Plan Symptomatic anemia with 2 week history of Melena after initiation of plavix CAD s/p PCI x 2 07/2021 with plavix and bASA use Probable mass in proximal stomach just distal to the gastroesophageal junction  Patient is on blood thinners, last dose plavix yesterday AM HGB 7.0 MCV 75.5 Platelets 486  Hemoglobin in April around 13, with drop to 11 after PCI. BUN 7.0 Cr 1.10- no elevation in BUN at this time GFR >60   Cardiology was consulted by hospitalist.  Continue to hold aspirin and Plavix.  Plan for EGD tomorrow. I thoroughly discussed the procedure to include nature, alternatives, benefits, and risks including but not limited to bleeding, perforation, infection, anesthesia/cardiac and pulmonary complications. Patient provides understanding and gave verbal consent to proceed.   Protonix 40 mg IV BID.   Clear liquid diet, NPO at midnight.   Continue daily CBC with transfusion as needed to maintain Hgb >7.     [facility] will follow.   LOS: 0 days   Vladimir Crofts  PA-C 10/08/2021, 9:01 AM  Contact #  [phone]

## 2021-10-08 NOTE — ED Provider Notes (Signed)
[facility] Emergency Department Provider Note MRN:  [id number]  Arrival date & time: 10/08/21     Chief Complaint   Abnormal Lab   History of Present Illness   Zachary Bowers is a 65 y.o. year-old male with a history of CAD, diabetes presenting to the ED with chief complaint of abnormal lab.  Low energy and fatigue for the past 1 or 2 months.  Also with lightheadedness upon standing.  Some right lower quadrant abdominal pain today lasting several hours.  Told to come to the emergency department for low blood levels.,  Told he needed a transfusion.  Denies fever, no cough, no chest pain or shortness of breath, some dyspnea on exertion.  Review of Systems  A complete 10 system review of systems was obtained and all systems are negative except as noted in the HPI and PMH.   Patient's Health History    Past Medical History:  Diagnosis Date   Coronary artery disease    Diabetes mellitus without complication ([place])    Hyperlipidemia    Hypertension    Hypogonadism in male    OSA (obstructive sleep apnea)    Thyroid disease     Past Surgical History:  Procedure Laterality Date   APPENDECTOMY     APPENDECTOMY Right    CARDIAC CATHETERIZATION     CORONARY STENT INTERVENTION N/A 08/11/2021   Procedure: CORONARY STENT INTERVENTION;  Surgeon: Jettie Booze, MD;  Location: [place] [facility];  Service: Cardiovascular;  Laterality: N/A;   INTRAVASCULAR ULTRASOUND/IVUS N/A 08/11/2021   Procedure: Intravascular Ultrasound/IVUS;  Surgeon: Jettie Booze, MD;  Location: [place] [facility];  Service: Cardiovascular;  Laterality: N/A;   LEFT HEART CATH AND CORONARY ANGIOGRAPHY N/A 08/11/2021   Procedure: LEFT HEART CATH AND CORONARY ANGIOGRAPHY;  Surgeon: Jettie Booze, MD;  Location: [place] [facility];  Service: Cardiovascular;  Laterality: N/A;    Family History  Problem Relation Age of Onset   Heart disease Mother    Alzheimer's disease Father      Social History   Socioeconomic History   Marital status: Married    Spouse name: Not on file   Number of children: 2   Years of education: Not on file   Highest education level: Not on file  Occupational History   Occupation: Retired Works with Horses  Tobacco Use   Smoking status: Former   Smokeless tobacco: Former    Types: Camera operator and Sexual Activity   Alcohol use: No   Drug use: No   Sexual activity: Not on file  Other Topics Concern   Not on file  Social History Narrative   Not on file   Social Determinants of Health   Financial Resource Strain: Not on file  Food Insecurity: Not on file  Transportation Needs: Not on file  Physical Activity: Not on file  Stress: Not on file  Social Connections: Not on file  Intimate Partner Violence: Not on file     Physical Exam   Vitals:   10/08/21 0500 10/08/21 0600  BP: 140/89 133/83  Pulse: 76 79  Resp: (!) 22 20  Temp:    SpO2: 99% 99%    CONSTITUTIONAL: Well-appearing, NAD NEURO:  Alert and oriented x 3, no focal deficits EYES:  eyes equal and reactive ENT/NECK:  no LAD, no JVD CARDIO: Regular rate, well-perfused, normal S1 and S2 PULM:  CTAB no wheezing or rhonchi GI/GU:  normal bowel sounds, non-distended, non-tender MSK/SPINE:  No gross deformities, no edema SKIN:  no rash, atraumatic PSYCH:  Appropriate speech and behavior  *Additional and/or pertinent findings included in MDM below  Diagnostic and Interventional Summary    EKG Interpretation  Date/Time:  Thursday October 08 2021 01:23:01 EDT Ventricular Rate:  81 PR Interval:  176 QRS Duration: 92 QT Interval:  391 QTC Calculation: 454 R Axis:   -2 Text Interpretation: Sinus rhythm Borderline low voltage, extremity leads Confirmed by Gerlene Fee [id number]) on 10/08/2021 1:32:35 AM       Labs Reviewed  CBC - Abnormal; Notable for the following components:      Result Value   RBC 3.19 (*)    Hemoglobin 7.0 (*)    HCT 24.1 (*)     MCV 75.5 (*)    MCH 21.9 (*)    MCHC 29.0 (*)    RDW 17.2 (*)    Platelets 486 (*)    All other components within normal limits  RESP PANEL BY RT-PCR (FLU A&B, COVID) ARPGX2  BASIC METABOLIC PANEL  URINALYSIS, ROUTINE W REFLEX MICROSCOPIC  CBG MONITORING, ED  POC OCCULT BLOOD, ED  PREPARE RBC (CROSSMATCH)  TYPE AND SCREEN  ABO/RH    CT ABDOMEN PELVIS W CONTRAST  Final Result      Medications  0.9 %  sodium chloride infusion (10 mL/hr Intravenous Not Given 10/08/21 0503)  iohexol (OMNIPAQUE) 300 MG/ML solution 80 mL (80 mLs Intravenous Contrast Given 10/08/21 0512)     Procedures  /  Critical Care .Critical Care Performed by: Maudie Flakes, MD Authorized by: Maudie Flakes, MD   Critical care provider statement:    Critical care time (minutes):  34   Critical care time was exclusive of:  Separately billable procedures and treating other patients   Critical care was necessary to treat or prevent imminent or life-threatening deterioration of the following conditions: Symptomatic anemia requiring blood transfusion.   Critical care was time spent personally by me on the following activities:  Ordering and performing treatments and interventions, ordering and review of laboratory studies, ordering and review of radiographic studies, re-evaluation of patient's condition, review of old charts, examination of patient and discussions with consultants   Care discussed with: admitting provider    ED Course and Medical Decision Making  I have reviewed the triage vital signs, the nursing notes, and pertinent available records from the EMR.  Listed above are laboratory and imaging tests that I personally ordered, reviewed, and interpreted and then considered in my medical decision making (see below for details).  History of CAD on dual antiplatelet therapy here with couple months of fatigue, lightheadedness with standing, dyspnea on exertion.  Found to have new onset anemia with a  hemoglobin of 7 here today, was 11.3 last month.  Denies any sources of bleeding.  Hemoccult is negative here in the emergency department.  Will CT abdomen given the abdominal pain to exclude any obvious sources of bleeding.  We will start transfusion given his symptomatic anemia, will admit to medicine.     Patient made aware of gastric mass and possibility of cancer.  Admitted to medicine.  Barth Kirks. Sedonia Small, [place] [email or web address]  Final Clinical Impressions(s) / ED Diagnoses     ICD-10-CM   1. Symptomatic anemia  D64.9       ED Discharge Orders     None        Discharge Instructions Discussed with and Provided to Patient:  Discharge Instructions   None       [place], [place], MD 10/08/21 [phone]

## 2021-10-09 ENCOUNTER — Inpatient Hospital Stay (HOSPITAL_COMMUNITY): Payer: Medicare PPO | Admitting: Anesthesiology

## 2021-10-09 ENCOUNTER — Encounter (HOSPITAL_COMMUNITY)
Admission: EM | Disposition: A | Discharge status: Home / Self Care | Payer: Self-pay | Source: Home / Self Care | Attending: Family Medicine

## 2021-10-09 ENCOUNTER — Encounter (HOSPITAL_COMMUNITY): Payer: Self-pay

## 2021-10-09 ENCOUNTER — Encounter (HOSPITAL_COMMUNITY): Payer: Self-pay | Admitting: Internal Medicine

## 2021-10-09 DIAGNOSIS — I25118 Atherosclerotic heart disease of native coronary artery with other forms of angina pectoris: Secondary | ICD-10-CM | POA: Diagnosis not present

## 2021-10-09 DIAGNOSIS — K3189 Other diseases of stomach and duodenum: Secondary | ICD-10-CM | POA: Diagnosis not present

## 2021-10-09 DIAGNOSIS — D649 Anemia, unspecified: Secondary | ICD-10-CM | POA: Diagnosis not present

## 2021-10-09 HISTORY — PX: ESOPHAGOGASTRODUODENOSCOPY (EGD) WITH PROPOFOL: SHX5813

## 2021-10-09 HISTORY — PX: BIOPSY: SHX5522

## 2021-10-09 LAB — BPAM RBC
Blood Product Expiration Date: 202211012359
Blood Product Expiration Date: 202211082359
Blood Product Expiration Date: 202211102359
ISSUE DATE / TIME: 202210270243
ISSUE DATE / TIME: 202210270925
ISSUE DATE / TIME: 202210271337
Unit Type and Rh: 600
Unit Type and Rh: 8400
Unit Type and Rh: 8400

## 2021-10-09 LAB — TYPE AND SCREEN
ABO/RH(D): AB POS
Antibody Screen: NEGATIVE
Unit division: 0
Unit division: 0
Unit division: 0

## 2021-10-09 LAB — CBC
HCT: 30.4 % — ABNORMAL LOW (ref 39.0–52.0)
Hemoglobin: 9.6 g/dL — ABNORMAL LOW (ref 13.0–17.0)
MCH: 24.3 pg — ABNORMAL LOW (ref 26.0–34.0)
MCHC: 31.6 g/dL (ref 30.0–36.0)
MCV: 77 fL — ABNORMAL LOW (ref 80.0–100.0)
Platelets: 481 10*3/uL — ABNORMAL HIGH (ref 150–400)
RBC: 3.95 MIL/uL — ABNORMAL LOW (ref 4.22–5.81)
RDW: 17.8 % — ABNORMAL HIGH (ref 11.5–15.5)
WBC: 9.9 10*3/uL (ref 4.0–10.5)
nRBC: 0 % (ref 0.0–0.2)

## 2021-10-09 LAB — CBC WITH DIFFERENTIAL/PLATELET
Abs Immature Granulocytes: 0.1 10*3/uL — ABNORMAL HIGH (ref 0.00–0.07)
Basophils Absolute: 0.1 10*3/uL (ref 0.0–0.1)
Basophils Relative: 1 %
Eosinophils Absolute: 0.4 10*3/uL (ref 0.0–0.5)
Eosinophils Relative: 4 %
HCT: 32.3 % — ABNORMAL LOW (ref 39.0–52.0)
Hemoglobin: 10 g/dL — ABNORMAL LOW (ref 13.0–17.0)
Immature Granulocytes: 1 %
Lymphocytes Relative: 20 %
Lymphs Abs: 1.9 10*3/uL (ref 0.7–4.0)
MCH: 23.9 pg — ABNORMAL LOW (ref 26.0–34.0)
MCHC: 31 g/dL (ref 30.0–36.0)
MCV: 77.1 fL — ABNORMAL LOW (ref 80.0–100.0)
Monocytes Absolute: 1.1 10*3/uL — ABNORMAL HIGH (ref 0.1–1.0)
Monocytes Relative: 12 %
Neutro Abs: 6 10*3/uL (ref 1.7–7.7)
Neutrophils Relative %: 62 %
Platelets: 475 10*3/uL — ABNORMAL HIGH (ref 150–400)
RBC: 4.19 MIL/uL — ABNORMAL LOW (ref 4.22–5.81)
RDW: 17.6 % — ABNORMAL HIGH (ref 11.5–15.5)
WBC: 9.6 10*3/uL (ref 4.0–10.5)
nRBC: 0 % (ref 0.0–0.2)

## 2021-10-09 LAB — BASIC METABOLIC PANEL
Anion gap: 6 (ref 5–15)
BUN: 8 mg/dL (ref 8–23)
CO2: 26 mmol/L (ref 22–32)
Calcium: 9.1 mg/dL (ref 8.9–10.3)
Chloride: 104 mmol/L (ref 98–111)
Creatinine, Ser: 1.21 mg/dL (ref 0.61–1.24)
GFR, Estimated: >60 mL/min (ref >60)
Glucose, Bld: 114 mg/dL — ABNORMAL HIGH (ref 70–99)
Potassium: 4 mmol/L (ref 3.5–5.1)
Sodium: 136 mmol/L (ref 135–145)

## 2021-10-09 LAB — GLUCOSE, CAPILLARY
Glucose-Capillary: 114 mg/dL — ABNORMAL HIGH (ref 70–99)
Glucose-Capillary: 112 mg/dL — ABNORMAL HIGH (ref 70–99)
Glucose-Capillary: 116 mg/dL — ABNORMAL HIGH (ref 70–99)
Glucose-Capillary: 163 mg/dL — ABNORMAL HIGH (ref 70–99)
Glucose-Capillary: 150 mg/dL — ABNORMAL HIGH (ref 70–99)

## 2021-10-09 SURGERY — ESOPHAGOGASTRODUODENOSCOPY (EGD) WITH PROPOFOL
Anesthesia: Monitor Anesthesia Care

## 2021-10-09 MED ORDER — PROPOFOL 500 MG/50ML IV EMUL
INTRAVENOUS | Status: DC | PRN
  Administered 2021-10-09: 150 ug/kg/min via INTRAVENOUS
  Administered 2021-10-09: 100 ug/kg/min via INTRAVENOUS

## 2021-10-09 MED ORDER — ISOSORBIDE MONONITRATE ER 30 MG PO TB24
15.0000 mg | ORAL_TABLET | Freq: Every day | ORAL | Status: DC
  Administered 2021-10-09: 15 mg via ORAL
  Filled 2021-10-09: qty 1

## 2021-10-09 MED ORDER — LIDOCAINE 2% (20 MG/ML) 5 ML SYRINGE
INTRAMUSCULAR | Status: DC | PRN
  Administered 2021-10-09: 100 mg via INTRAVENOUS

## 2021-10-09 MED ORDER — LACTATED RINGERS IV SOLN: INTRAVENOUS | Status: DC | PRN

## 2021-10-09 MED ORDER — SODIUM CHLORIDE 0.9 % IV SOLN: INTRAVENOUS | Status: DC

## 2021-10-09 MED ORDER — PANTOPRAZOLE SODIUM 40 MG IV SOLR
40.0000 mg | Freq: Two times a day (BID) | INTRAVENOUS | Status: DC
  Administered 2021-10-09 – 2021-10-11 (×5): 40 mg via INTRAVENOUS
  Filled 2021-10-09 (×5): qty 40

## 2021-10-09 MED ORDER — ONDANSETRON HCL 4 MG/2ML IJ SOLN: 4.0000 mg | Freq: Once | INTRAMUSCULAR | Status: DC | PRN

## 2021-10-09 MED ORDER — PHENYLEPHRINE 40 MCG/ML (10ML) SYRINGE FOR IV PUSH (FOR BLOOD PRESSURE SUPPORT)
PREFILLED_SYRINGE | INTRAVENOUS | Status: DC | PRN
  Administered 2021-10-09: 120 ug via INTRAVENOUS

## 2021-10-09 MED ORDER — FENTANYL CITRATE (PF) 100 MCG/2ML IJ SOLN: 25.0000 ug | INTRAMUSCULAR | Status: DC | PRN

## 2021-10-09 MED ORDER — PROPOFOL 10 MG/ML IV BOLUS
INTRAVENOUS | Status: DC | PRN
  Administered 2021-10-09: 20 mg via INTRAVENOUS
  Administered 2021-10-09: 30 mg via INTRAVENOUS
  Administered 2021-10-09: 50 mg via INTRAVENOUS

## 2021-10-09 SURGICAL SUPPLY — 15 items

## 2021-10-09 NOTE — Progress Notes (Signed)
PROGRESS NOTE    Zachary Bowers  OAC:[id number] DOB: Feb 04, 1956 DOA: 10/07/2021 PCP: Shirline Frees, MD   Brief Narrative:  HPI: Zachary Bowers is a 65 y.o. male with medical history significant of CAD/MI s/p  PCI w/ stents, HLD, DM, HTN who reports two months of progressive weakness and fatigue. He was seen by his PCP Monday, October 24th. Lab revealed significant anemia. He also reports a very recent h/o abdominal pain. He was advised to be evaluated in ED for anemia.     ED Course: T 97.9  130/82  HR 79  RR 22. Lab with nl Bmet, Hgb 7, Plt 486 He was typed and cross-matched. CT abdomen revealed a gastric mass 4.8x3.4x2.9 and a gastro-hepatic lymphnode 2.1x2.7x2.3. TRH called to admit for further management and treatment.   Assessment & Plan:   Active Problems:   Pure hypercholesterolemia   Essential hypertension, benign   Symptomatic anemia   Gastric mass  Chronic blood loss anemia/gastric mass: Presented with hemoglobin of 7.  Received 2 units of PRBC.  Hemoglobin pain post transfusion.  No complaints.  Undergoing EGD today for possible gastric mass that was found on the CT scan.  GI on board.     Continue to hold aspirin and Plavix.  History of CAD/MI s/p PCI with stents: Holding aspirin and Plavix, cardiology on board.  They recommended resuming both of them as soon as GI clears that.  Hyperlipidemia: Continue atorvastatin.  Hypertension: Controlled.  Continue amlodipine, Avapro, Imdur and rest of the medications.  Hypothyroidism: Continue Synthroid.  DVT prophylaxis: heparin injection 5,000 Units Start: 10/08/21 0745   Code Status: Full Code  Family Communication: wife None present at bedside.  Plan of care discussed with patient in length and he verbalized understanding and agreed with it.  Status is: Inpatient  Remains inpatient appropriate because: Needs further work-up.   Estimated body mass index is 31.87 kg/m as calculated from the following:   Height as of  this encounter: 6' (1.829 m).   Weight as of this encounter: 106.6 kg.     Nutritional Assessment: Body mass index is 31.87 kg/m.Marland Kitchen Seen by dietician.  I agree with the assessment and plan as outlined below: Nutrition Status:        .  Skin Assessment: I have examined the patient's skin and I agree with the wound assessment as performed by the wound care RN as outlined below:    Consultants:  GI  Procedures:  EGD  Antimicrobials:  Anti-infectives (From admission, onward)    None          Subjective: Patient seen and examined this morning before EGD.  He had no complaints.  He was feeling better since he received some blood transfusion.  Wife was at the bedside.  Objective: Vitals:   10/09/21 0955 10/09/21 1010 10/09/21 1027 10/09/21 1125  BP: 113/77 121/82 130/84 140/86  Pulse: 76 73  68  Resp: (!) 23 20  (!) 23  Temp: 98 F (36.7 C) 98 F (36.7 C)  97.8 F (36.6 C)  TempSrc:    Oral  SpO2: 95% 96%  95%  Weight:      Height:        Intake/Output Summary (Last 24 hours) at 10/09/2021 1319 Last data filed at 10/09/2021 1246 Gross per 24 hour  Intake 1695.52 ml  Output --  Net 1695.52 ml   Filed Weights   10/09/21 0843  Weight: 106.6 kg    Examination:  General exam: Appears calm and  comfortable  Respiratory system: Clear to auscultation. Respiratory effort normal. Cardiovascular system: S1 & S2 heard, RRR. No JVD, murmurs, rubs, gallops or clicks. No pedal edema. Gastrointestinal system: Abdomen is nondistended, soft and mildly to moderately tender around paramedical, epigastric and right upper quadrant area, no organomegaly or masses felt. Normal bowel sounds heard. Central nervous system: Alert and oriented. No focal neurological deficits. Extremities: Symmetric 5 x 5 power. Skin: No rashes, lesions or ulcers Psychiatry: Judgement and insight appear normal. Mood & affect appropriate.    Data Reviewed: I have personally reviewed following  labs and imaging studies  CBC: Recent Labs  Lab 10/07/21 1659 10/08/21 1823 10/09/21 1048  WBC 7.3  --  9.6  NEUTROABS  --   --  6.0  HGB 7.0* 9.8* 10.0*  HCT 24.1* 31.4* 32.3*  MCV 75.5*  --  77.1*  PLT 486*  --  295*   Basic Metabolic Panel: Recent Labs  Lab 10/07/21 1659 10/09/21 1048  NA 139 136  K 4.0 4.0  CL 109 104  CO2 24 26  GLUCOSE 82 114*  BUN 8 8  CREATININE 1.10 1.21  CALCIUM 9.3 9.1   GFR: Estimated Creatinine Clearance: 75.8 mL/min (by C-G formula based on SCr of 1.21 mg/dL). Liver Function Tests: No results for input(s): AST, ALT, ALKPHOS, BILITOT, PROT, ALBUMIN in the last 168 hours. No results for input(s): LIPASE, AMYLASE in the last 168 hours. No results for input(s): AMMONIA in the last 168 hours. Coagulation Profile: No results for input(s): INR, PROTIME in the last 168 hours. Cardiac Enzymes: No results for input(s): CKTOTAL, CKMB, CKMBINDEX, TROPONINI in the last 168 hours. BNP (last 3 results) No results for input(s): PROBNP in the last 8760 hours. HbA1C: Recent Labs    10/08/21 0735  HGBA1C 6.8*   CBG: Recent Labs  Lab 10/08/21 1828 10/08/21 2114 10/09/21 0613 10/09/21 0956 10/09/21 1124  GLUCAP 219* 143* 114* 112* 116*   Lipid Profile: No results for input(s): CHOL, HDL, LDLCALC, TRIG, CHOLHDL, LDLDIRECT in the last 72 hours. Thyroid Function Tests: No results for input(s): TSH, T4TOTAL, FREET4, T3FREE, THYROIDAB in the last 72 hours. Anemia Panel: No results for input(s): VITAMINB12, FOLATE, FERRITIN, TIBC, IRON, RETICCTPCT in the last 72 hours. Sepsis Labs: No results for input(s): PROCALCITON, LATICACIDVEN in the last 168 hours.  Recent Results (from the past 240 hour(s))  Resp Panel by RT-PCR (Flu A&B, Covid) Nasopharyngeal Swab     Status: None   Collection Time: 10/08/21  2:06 AM   Specimen: Nasopharyngeal Swab; Nasopharyngeal(NP) swabs in vial transport medium  Result Value Ref Range Status   SARS Coronavirus 2  by RT PCR NEGATIVE NEGATIVE Final    Comment: (NOTE) SARS-CoV-2 target nucleic acids are NOT DETECTED.  The SARS-CoV-2 RNA is generally detectable in upper respiratory specimens during the acute phase of infection. The lowest concentration of SARS-CoV-2 viral copies this assay can detect is 138 copies/mL. A negative result does not preclude SARS-Cov-2 infection and should not be used as the sole basis for treatment or other patient management decisions. A negative result may occur with  improper specimen collection/handling, submission of specimen other than nasopharyngeal swab, presence of viral mutation(s) within the areas targeted by this assay, and inadequate number of viral copies(<138 copies/mL). A negative result must be combined with clinical observations, patient history, and epidemiological information. The expected result is Negative.  Fact Sheet for Patients:  EntrepreneurPulse.com.au  Fact Sheet for Healthcare Providers:  IncredibleEmployment.be  This test is no  t yet approved or cleared by the [place] and  has been authorized for detection and/or diagnosis of SARS-CoV-2 by FDA under an Emergency Use Authorization (EUA). This EUA will remain  in effect (meaning this test can be used) for the duration of the COVID-19 declaration under Section 564(b)(1) of the Act, 21 U.S.C.section 360bbb-3(b)(1), unless the authorization is terminated  or revoked sooner.       Influenza A by PCR NEGATIVE NEGATIVE Final   Influenza B by PCR NEGATIVE NEGATIVE Final    Comment: (NOTE) The Xpert Xpress SARS-CoV-2/FLU/RSV plus assay is intended as an aid in the diagnosis of influenza from Nasopharyngeal swab specimens and should not be used as a sole basis for treatment. Nasal washings and aspirates are unacceptable for Xpert Xpress SARS-CoV-2/FLU/RSV testing.  Fact Sheet for Patients: EntrepreneurPulse.com.au  Fact  Sheet for Healthcare Providers: IncredibleEmployment.be  This test is not yet approved or cleared by the [place] FDA and has been authorized for detection and/or diagnosis of SARS-CoV-2 by FDA under an Emergency Use Authorization (EUA). This EUA will remain in effect (meaning this test can be used) for the duration of the COVID-19 declaration under Section 564(b)(1) of the Act, 21 U.S.C. section 360bbb-3(b)(1), unless the authorization is terminated or revoked.  Performed at [facility], [place] [address] [place], [place] [address]   MRSA Next Gen by PCR, Nasal     Status: None   Collection Time: 10/08/21  8:38 PM   Specimen: Nasal Mucosa; Nasal Swab  Result Value Ref Range Status   MRSA by PCR Next Gen NOT DETECTED NOT DETECTED Final    Comment: (NOTE) The GeneXpert MRSA Assay (FDA approved for NASAL specimens only), is one component of a comprehensive MRSA colonization surveillance program. It is not intended to diagnose MRSA infection nor to guide or monitor treatment for MRSA infections. Test performance is not FDA approved in patients less than 63 years old. Performed at [facility], [place] [address], [place], [place] [address]       Radiology Studies: CT ABDOMEN PELVIS W CONTRAST  Result Date: 10/08/2021 CLINICAL DATA:  65 year old male with history of anemia presenting with right lower quadrant abdominal pain. EXAM: CT ABDOMEN AND PELVIS WITH CONTRAST TECHNIQUE: Multidetector CT imaging of the abdomen and pelvis was performed using the standard protocol following bolus administration of intravenous contrast. CONTRAST:  38mL OMNIPAQUE IOHEXOL 300 MG/ML  SOLN COMPARISON:  No priors. FINDINGS: Lower chest: Mild scarring in the lung bases bilaterally. Atherosclerotic calcifications in the left anterior descending, left circumflex and right coronary arteries. Hepatobiliary: No suspicious cystic or solid hepatic lesions. No intra or extrahepatic  biliary ductal dilatation. Gallbladder is normal in appearance. Pancreas: No pancreatic mass. No pancreatic ductal dilatation. No pancreatic or peripancreatic fluid collections or inflammatory changes. Spleen: Unremarkable. Adrenals/Urinary Tract: Bilateral kidneys and adrenal glands are normal in appearance. No hydroureteronephrosis. Urinary bladder is normal in appearance. Stomach/Bowel: Potential soft tissue mass in the proximal stomach (axial image 20 of series 3 and coronal image 53 of series 6) estimated to measure approximately 4.8 x 3.4 x 2.9 cm. Distal stomach is otherwise normal in appearance. No pathologic dilatation of small bowel or colon. The appendix is not confidently identified and may be surgically absent. Regardless, there are no inflammatory changes noted adjacent to the cecum to suggest the presence of an acute appendicitis at this time. Vascular/Lymphatic: Aortic atherosclerosis, without evidence of aneurysm or dissection in the abdominal or pelvic vasculature. In the  upper abdomen in the region of the gastrohepatic ligament (axial image 25 of series 3 and coronal image 46 of series 6) there is a soft tissue attenuation mass measuring 2.1 x 2.7 x 2.3 cm, likely an enlarged gastrohepatic ligament lymph node. No other definite lymphadenopathy noted elsewhere in the abdomen or pelvis. Reproductive: Prostate gland and seminal vesicles are unremarkable in appearance. Other: No significant volume of ascites.  No pneumoperitoneum. Musculoskeletal: There are no aggressive appearing lytic or blastic lesions noted in the visualized portions of the skeleton. IMPRESSION: 1. No acute findings are noted to account for the patient's right lower quadrant abdominal pain. However, there is a probable mass in the proximal stomach just distal to the gastroesophageal junction. Adjacent to this in the gastrohepatic ligament there is an enlarged lymph node. Findings are concerning for possible primary gastric  neoplasm with local nodal metastasis. Further evaluation with endoscopy is strongly recommended in the near future to better evaluate this finding. 2. Aortic atherosclerosis, in addition to least 3 vessel coronary artery disease. Please note that although the presence of coronary artery calcium documents the presence of coronary artery disease, the severity of this disease and any potential stenosis cannot be assessed on this non-gated CT examination. Assessment for potential risk factor modification, dietary therapy or pharmacologic therapy may be warranted, if clinically indicated. Electronically Signed   By: Vinnie Langton M.D.   On: 10/08/2021 05:42    Scheduled Meds:  amLODipine  10 mg Oral Daily   atorvastatin  80 mg Oral Daily   heparin  5,000 Units Subcutaneous Q8H   insulin aspart  0-20 Units Subcutaneous TID WC   irbesartan  150 mg Oral Daily   isosorbide mononitrate  15 mg Oral Daily   levothyroxine  125 mcg Oral Q0600   pantoprazole (PROTONIX) IV  40 mg Intravenous Q12H   Continuous Infusions:  sodium chloride Stopped (10/09/21 0955)   sodium chloride       LOS: 1 day   Time spent: 33 minutes   Darliss Cheney, MD [facility]  10/09/2021, 1:19 PM  Please page via [employer] and do not message via secure chat for anything urgent. Secure chat can be used for anything non urgent.  How to contact the [facility] Attending or Consulting provider [place] or covering provider during after hours [place], for this patient?  Check the care team in [facility] and look for a) attending/consulting [facility] provider listed and b) the [facility] team listed. Page or secure chat [phone]. Log into [email or web address] and use [place]'s universal password to access. If you do not have the password, please contact the hospital operator. Locate the [facility] provider you are looking for under [facility] and page to a number that you can be directly reached. If you still have difficulty reaching the provider, please page  the [facility] (Director on Call) for the Hospitalists listed on amion for assistance.

## 2021-10-09 NOTE — Interval H&P Note (Signed)
History and Physical Interval Note:  10/09/2021 9:14 AM  Zachary Bowers  has presented today for surgery, with the diagnosis of symptomatic anemia, possible gastric mass proximal stomach, distal to GE junction.  The various methods of treatment have been discussed with the patient and family. After consideration of risks, benefits and other options for treatment, the patient has consented to  Procedure(s): ESOPHAGOGASTRODUODENOSCOPY (EGD) WITH PROPOFOL (N/A) as a surgical intervention.  The patient's history has been reviewed, patient examined, no change in status, stable for surgery.  I have reviewed the patient's chart and labs.  Questions were answered to the patient's satisfaction.     Lear Ng

## 2021-10-09 NOTE — Plan of Care (Signed)

## 2021-10-09 NOTE — Transfer of Care (Signed)
Immediate Anesthesia Transfer of Care Note  Patient: Zachary Bowers  Procedure(s) Performed: ESOPHAGOGASTRODUODENOSCOPY (EGD) WITH PROPOFOL BIOPSY  Patient Location: PACU  Anesthesia Type:MAC  Level of Consciousness: drowsy  Airway & Oxygen Therapy: Patient Spontanous Breathing and Patient connected to nasal cannula oxygen  Post-op Assessment: Report given to RN and Post -op Vital signs reviewed and stable  Post vital signs: Reviewed and stable  Last Vitals:  Vitals Value Taken Time  BP 113/77   Temp    Pulse 78   Resp 16   SpO2 97     Last Pain:  Vitals:   10/09/21 0843  TempSrc: Temporal  PainSc: 0-No pain         Complications: No notable events documented.

## 2021-10-09 NOTE — Anesthesia Preprocedure Evaluation (Addendum)
Anesthesia Evaluation  Patient identified by MRN, date of birth, ID band Patient awake    Reviewed: Allergy & Precautions, NPO status , Patient's Chart, lab work & pertinent test results  Airway Mallampati: II  TM Distance: >3 FB Neck ROM: Full    Dental  (+) Poor Dentition, Dental Advisory Given, Missing, Loose, Edentulous Upper,    Pulmonary sleep apnea (non compliant with CPAP) , former smoker (remote hx),    Pulmonary exam normal breath sounds clear to auscultation       Cardiovascular hypertension, Pt. on medications + CAD and + Cardiac Stents (07/2021, plavix)  Normal cardiovascular exam+ Valvular Problems/Murmurs (mild AI) AI  Rhythm:Regular Rate:Normal  Echo 08/2021: 1. Left ventricular ejection fraction, by estimation, is 60 to 65%. The  left ventricle has normal function. The left ventricle has no regional  wall motion abnormalities. Left ventricular diastolic parameters are  consistent with Grade I diastolic  dysfunction (impaired relaxation).  2. Right ventricular systolic function is normal. The right ventricular  size is normal.  3. The mitral valve is normal in structure. No evidence of mitral valve  regurgitation. No evidence of mitral stenosis.  4. The aortic valve is tricuspid. Aortic valve regurgitation is mild. No  aortic stenosis is present.  5. The inferior vena cava is normal in size with greater than 50%  respiratory variability, suggesting right atrial pressure of 3 mmHg.   Cath 07/2021: Successful PCI to the proximal and distal RCA.  Given his diffuse small vessel/branch vessel disease, would strongly consider long-term clopidogrel monotherapy after 6 months of dual antiplatelet therapy. If he has persistent angina, could address the distal circumflex or the small ramus vessel percutaneously.    Neuro/Psych negative neurological ROS  negative psych ROS   GI/Hepatic Neg liver ROS, GIB Possible  gastric mass proximal stomach distal to GE jx   Endo/Other  diabetes, Well Controlled, Type 2, Oral Hypoglycemic AgentsHypothyroidism   Renal/GU negative Renal ROS  negative genitourinary   Musculoskeletal negative musculoskeletal ROS (+)   Abdominal   Peds  Hematology  (+) Blood dyscrasia, anemia , 9.8/31.4   Anesthesia Other Findings Last A/C 2d ago  Reproductive/Obstetrics negative OB ROS                           Anesthesia Physical Anesthesia Plan  ASA: 3  Anesthesia Plan: MAC   Post-op Pain Management:    Induction:   PONV Risk Score and Plan: 2 and Propofol infusion and TIVA  Airway Management Planned: Natural Airway and Simple Face Mask  Additional Equipment: None  Intra-op Plan:   Post-operative Plan:   Informed Consent: I have reviewed the patients History and Physical, chart, labs and discussed the procedure including the risks, benefits and alternatives for the proposed anesthesia with the patient or authorized representative who has indicated his/her understanding and acceptance.       Plan Discussed with: CRNA  Anesthesia Plan Comments:         Anesthesia Quick Evaluation

## 2021-10-09 NOTE — Brief Op Note (Signed)
Large fungating proximal stomach mass that is non-obstructing. Biopsies taken. Will need oncology and surgery consultation when path results (sent rush) are back. Hold Plavix but timing of resuming is problematic. High risk of further bleeding from the mass and risk increases on Plavix. Dr. Watt Climes will f/u for [place] tomorrow.

## 2021-10-09 NOTE — Progress Notes (Signed)
Progress Note  Patient Name: Zachary Bowers Date of Encounter: 10/09/2021  Primary Cardiologist: Evalina Field, MD   Subjective   EGD performed today; found to have large fungating proximal stomach mass.  Pending biopsies.  Not cleared for anti-platelet therapy at this time by GI.  Notes slight chest pain that has persisted post transfusion.  Inpatient Medications    Scheduled Meds:  amLODipine  10 mg Oral Daily   atorvastatin  80 mg Oral Daily   heparin  5,000 Units Subcutaneous Q8H   insulin aspart  0-20 Units Subcutaneous TID WC   irbesartan  150 mg Oral Daily   isosorbide mononitrate  15 mg Oral Daily   levothyroxine  125 mcg Oral Q0600   Continuous Infusions:  sodium chloride Stopped (10/09/21 0955)   sodium chloride     PRN Meds: acetaminophen **OR** acetaminophen, ketorolac, nitroGLYCERIN   Vital Signs    Vitals:   10/09/21 0843 10/09/21 0955 10/09/21 1010 10/09/21 1027  BP: (!) 148/84 113/77 121/82 130/84  Pulse: 75 76 73   Resp: (!) 23 (!) 23 20   Temp: (!) 97.4 F (36.3 C) 98 F (36.7 C) 98 F (36.7 C)   TempSrc: Temporal     SpO2: 97% 95% 96%   Weight: 106.6 kg     Height: 6' (1.829 m)       Intake/Output Summary (Last 24 hours) at 10/09/2021 1101 Last data filed at 10/09/2021 0955 Gross per 24 hour  Intake 1275.52 ml  Output 1300 ml  Net -24.48 ml   Filed Weights   10/09/21 0843  Weight: 106.6 kg    Telemetry    SR - Personally Reviewed  ECG    No new - Personally Reviewed  Physical Exam   GEN: No acute distress.   Neck: No JVD Cardiac: RRR, no murmurs, rubs, or gallops.  Respiratory: Clear to auscultation bilaterally. GI: Soft, nontender, non-distended  MS: No edema; No deformity. Neuro:  Nonfocal  Psych: Normal affect   Labs    Chemistry Recent Labs  Lab 10/07/21 1659  NA 139  K 4.0  CL 109  CO2 24  GLUCOSE 82  BUN 8  CREATININE 1.10  CALCIUM 9.3  GFRNONAA >60  ANIONGAP 6     Hematology Recent Labs   Lab 10/07/21 1659 10/08/21 1823  WBC 7.3  --   RBC 3.19*  --   HGB 7.0* 9.8*  HCT 24.1* 31.4*  MCV 75.5*  --   MCH 21.9*  --   MCHC 29.0*  --   RDW 17.2*  --   PLT 486*  --     Cardiac EnzymesNo results for input(s): TROPONINI in the last 168 hours. No results for input(s): TROPIPOC in the last 168 hours.   BNPNo results for input(s): BNP, PROBNP in the last 168 hours.   DDimer No results for input(s): DDIMER in the last 168 hours.   Radiology    CT ABDOMEN PELVIS W CONTRAST  Result Date: 10/08/2021 CLINICAL DATA:  65 year old male with history of anemia presenting with right lower quadrant abdominal pain. EXAM: CT ABDOMEN AND PELVIS WITH CONTRAST TECHNIQUE: Multidetector CT imaging of the abdomen and pelvis was performed using the standard protocol following bolus administration of intravenous contrast. CONTRAST:  35mL OMNIPAQUE IOHEXOL 300 MG/ML  SOLN COMPARISON:  No priors. FINDINGS: Lower chest: Mild scarring in the lung bases bilaterally. Atherosclerotic calcifications in the left anterior descending, left circumflex and right coronary arteries. Hepatobiliary: No suspicious cystic or solid  hepatic lesions. No intra or extrahepatic biliary ductal dilatation. Gallbladder is normal in appearance. Pancreas: No pancreatic mass. No pancreatic ductal dilatation. No pancreatic or peripancreatic fluid collections or inflammatory changes. Spleen: Unremarkable. Adrenals/Urinary Tract: Bilateral kidneys and adrenal glands are normal in appearance. No hydroureteronephrosis. Urinary bladder is normal in appearance. Stomach/Bowel: Potential soft tissue mass in the proximal stomach (axial image 20 of series 3 and coronal image 53 of series 6) estimated to measure approximately 4.8 x 3.4 x 2.9 cm. Distal stomach is otherwise normal in appearance. No pathologic dilatation of small bowel or colon. The appendix is not confidently identified and may be surgically absent. Regardless, there are no  inflammatory changes noted adjacent to the cecum to suggest the presence of an acute appendicitis at this time. Vascular/Lymphatic: Aortic atherosclerosis, without evidence of aneurysm or dissection in the abdominal or pelvic vasculature. In the upper abdomen in the region of the gastrohepatic ligament (axial image 25 of series 3 and coronal image 46 of series 6) there is a soft tissue attenuation mass measuring 2.1 x 2.7 x 2.3 cm, likely an enlarged gastrohepatic ligament lymph node. No other definite lymphadenopathy noted elsewhere in the abdomen or pelvis. Reproductive: Prostate gland and seminal vesicles are unremarkable in appearance. Other: No significant volume of ascites.  No pneumoperitoneum. Musculoskeletal: There are no aggressive appearing lytic or blastic lesions noted in the visualized portions of the skeleton. IMPRESSION: 1. No acute findings are noted to account for the patient's right lower quadrant abdominal pain. However, there is a probable mass in the proximal stomach just distal to the gastroesophageal junction. Adjacent to this in the gastrohepatic ligament there is an enlarged lymph node. Findings are concerning for possible primary gastric neoplasm with local nodal metastasis. Further evaluation with endoscopy is strongly recommended in the near future to better evaluate this finding. 2. Aortic atherosclerosis, in addition to least 3 vessel coronary artery disease. Please note that although the presence of coronary artery calcium documents the presence of coronary artery disease, the severity of this disease and any potential stenosis cannot be assessed on this non-gated CT examination. Assessment for potential risk factor modification, dietary therapy or pharmacologic therapy may be warranted, if clinically indicated. Electronically Signed   By: Vinnie Langton M.D.   On: 10/08/2021 05:42      Assessment & Plan    CAD DES x 2 to mid and distal RCA Anemia with gastric mass - PCI on  08/11/21 - has completed less than 2 months of DAPT with ASA and plavix - Hgb above 8 without further bleeding  - when able per GI would add back ASA, ideally would be on DAPT but this would not be tolerated at this time - if going for surgery, he is at increased risk of post surgical MI given his antiplatelet limitations and residual disease, but we are uable to mitigate this risk secondary to his bleeding and mass; surgery is reasonable if needed without further cardiac testing, discussed with patient and family   Hypertension - continue amlodipine and ARB - Imdur 15 mg PO daily next medication for angina, we have discussed that if side effects (HA) are worse that the CP, that we would dc the imdur   Potential 10/10/21 signoff  For questions or updates, please contact [place] Please consult [email or web address] for contact info under Cardiology/STEMI.      Signed, Werner Lean, MD  10/09/2021, 11:01 AM

## 2021-10-09 NOTE — Anesthesia Postprocedure Evaluation (Signed)
Anesthesia Post Note  Patient: Keyan Folson  Procedure(s) Performed: ESOPHAGOGASTRODUODENOSCOPY (EGD) WITH PROPOFOL BIOPSY     Patient location during evaluation: PACU Anesthesia Type: MAC Level of consciousness: awake and alert Pain management: pain level controlled Vital Signs Assessment: post-procedure vital signs reviewed and stable Respiratory status: spontaneous breathing, nonlabored ventilation and respiratory function stable Cardiovascular status: blood pressure returned to baseline and stable Postop Assessment: no apparent nausea or vomiting Anesthetic complications: no   No notable events documented.  Last Vitals:  Vitals:   10/09/21 1027 10/09/21 1125  BP: 130/84 140/86  Pulse:  68  Resp:  (!) 23  Temp:  36.6 C  SpO2:  95%    Last Pain:  Vitals:   10/09/21 1125  TempSrc: Oral  PainSc:                  Pervis Hocking

## 2021-10-09 NOTE — Op Note (Signed)
[facility] Patient Name: Zachary Bowers Procedure Date : 10/09/2021 MRN: [id number] Attending MD: Lear Ng , MD Date of Birth: March 05, 1956 CSN: [id number] Age: 65 Admit Type: Inpatient Procedure:                Upper GI endoscopy Indications:              Acute post hemorrhagic anemia, Melena, Abnormal CT                            of the GI tract Providers:                Lear Ng, MD, Jeanella Cara, RN,                            Cletis Athens, Technician, Elmer Sow Referring MD:             hospital team Medicines:                Propofol per Anesthesia, Monitored Anesthesia Care Complications:            No immediate complications. Estimated Blood Loss:     Estimated blood loss was minimal. Procedure:                Pre-Anesthesia Assessment:                           - Prior to the procedure, a History and Physical                            was performed, and patient medications and                            allergies were reviewed. The patient's tolerance of                            previous anesthesia was also reviewed. The risks                            and benefits of the procedure and the sedation                            options and risks were discussed with the patient.                            All questions were answered, and informed consent                            was obtained. Prior Anticoagulants: The patient has                            taken no previous anticoagulant or antiplatelet                            agents. ASA Grade Assessment: III - A patient with  severe systemic disease. After reviewing the risks                            and benefits, the patient was deemed in                            satisfactory condition to undergo the procedure.                           After obtaining informed consent, the endoscope was                            passed under direct vision.  Throughout the                            procedure, the patient's blood pressure, pulse, and                            oxygen saturations were monitored continuously. The                            [id number]-H190 ([id number]) Olympus endoscope was introduced                            through the mouth, and advanced to the second part                            of duodenum. The upper GI endoscopy was                            accomplished without difficulty. The patient                            tolerated the procedure well. Scope In: Scope Out: Findings:      The examined esophagus was normal.      The Z-line was irregular and was found 46 cm from the incisors.      A large, fungating, circumferential mass with oozing bleeding and       stigmata of recent bleeding was found in the cardia and in the gastric       fundus. Biopsies were taken with a cold forceps for histology. Estimated       blood loss was minimal.      A few medium sessile polyps with no bleeding and no stigmata of recent       bleeding were found in the gastric antrum.      The examined duodenum was normal. Impression:               - Normal esophagus.                           - Z-line irregular, 46 cm from the incisors.                           - Malignant gastric tumor in the cardia and in the  gastric fundus. Biopsied.                           - A few gastric polyps.                           - Normal examined duodenum. Recommendation:           - Clear liquid diet.                           - Await pathology results.                           - Medical Oncology and Surgery consults when                            pathology results are available.                           - Defer timing of restarting of Plavix to                            cardiology and hospital medicine teams. Procedure Code(s):        --- Professional ---                           [id number], Esophagogastroduodenoscopy,  flexible,                            transoral; with biopsy, single or multiple Diagnosis Code(s):        --- Professional ---                           C16.1, Malignant neoplasm of fundus of stomach                           C16.0, Malignant neoplasm of cardia                           K92.1, Melena (includes Hematochezia)                           D62, Acute posthemorrhagic anemia                           R93.3, Abnormal findings on diagnostic imaging of                            other parts of digestive tract                           K31.7, Polyp of stomach and duodenum                           K22.8, Other specified diseases of esophagus CPT copyright 2019 [employer]. All rights reserved. The codes documented in this report are preliminary and upon coder review  may  be revised to meet current compliance requirements. Lear Ng, MD 10/09/2021 9:57:00 AM This report has been signed electronically. Number of Addenda: 0

## 2021-10-09 NOTE — Plan of Care (Signed)
  Problem: Education: Goal: Knowledge of General Education information will improve Description: Including pain rating scale, medication(s)/side effects and non-pharmacologic comfort measures Outcome: Progressing   Problem: Health Behavior/Discharge Planning: Goal: Ability to manage health-related needs will improve Outcome: Progressing   Problem: Nutrition: Goal: Adequate nutrition will be maintained Outcome: Progressing   Problem: Elimination: Goal: Will not experience complications related to bowel motility Outcome: Progressing   Problem: Pain Managment: Goal: General experience of comfort will improve Outcome: Progressing   

## 2021-10-09 NOTE — Progress Notes (Signed)
Mobility Specialist Progress Note:   10/09/21 1544  Mobility  Activity Ambulated in hall  Level of Assistance Standby assist, set-up cues, supervision of patient - no hands on  Assistive Device None  Distance Ambulated (ft) 340 ft  Mobility Ambulated with assistance in hallway  Mobility Response Tolerated well  Mobility performed by Mobility specialist  $Mobility charge 1 Mobility   Pt received in bed willing to participate in mobility. No complaints of pain and asymptomatic throughout ambulation. Pt returned to bed with call bell in reach and all needs met.   [facility] Health and safety inspector Phone [phone]

## 2021-10-10 DIAGNOSIS — I10 Essential (primary) hypertension: Secondary | ICD-10-CM | POA: Diagnosis not present

## 2021-10-10 DIAGNOSIS — K3189 Other diseases of stomach and duodenum: Secondary | ICD-10-CM | POA: Diagnosis not present

## 2021-10-10 DIAGNOSIS — D649 Anemia, unspecified: Secondary | ICD-10-CM | POA: Diagnosis not present

## 2021-10-10 DIAGNOSIS — I25118 Atherosclerotic heart disease of native coronary artery with other forms of angina pectoris: Secondary | ICD-10-CM | POA: Diagnosis not present

## 2021-10-10 LAB — BASIC METABOLIC PANEL
Anion gap: 7 (ref 5–15)
BUN: 7 mg/dL — ABNORMAL LOW (ref 8–23)
CO2: 22 mmol/L (ref 22–32)
Calcium: 9 mg/dL (ref 8.9–10.3)
Chloride: 103 mmol/L (ref 98–111)
Creatinine, Ser: 1.17 mg/dL (ref 0.61–1.24)
GFR, Estimated: >60 mL/min (ref >60)
Glucose, Bld: 144 mg/dL — ABNORMAL HIGH (ref 70–99)
Potassium: 3.8 mmol/L (ref 3.5–5.1)
Sodium: 132 mmol/L — ABNORMAL LOW (ref 135–145)

## 2021-10-10 LAB — CBC
HCT: 29.7 % — ABNORMAL LOW (ref 39.0–52.0)
HCT: 29.2 % — ABNORMAL LOW (ref 39.0–52.0)
Hemoglobin: 9.4 g/dL — ABNORMAL LOW (ref 13.0–17.0)
Hemoglobin: 9.1 g/dL — ABNORMAL LOW (ref 13.0–17.0)
MCH: 24.4 pg — ABNORMAL LOW (ref 26.0–34.0)
MCH: 24.5 pg — ABNORMAL LOW (ref 26.0–34.0)
MCHC: 31.6 g/dL (ref 30.0–36.0)
MCHC: 31.2 g/dL (ref 30.0–36.0)
MCV: 76.9 fL — ABNORMAL LOW (ref 80.0–100.0)
MCV: 78.5 fL — ABNORMAL LOW (ref 80.0–100.0)
Platelets: 461 10*3/uL — ABNORMAL HIGH (ref 150–400)
Platelets: 430 10*3/uL — ABNORMAL HIGH (ref 150–400)
RBC: 3.86 MIL/uL — ABNORMAL LOW (ref 4.22–5.81)
RBC: 3.72 MIL/uL — ABNORMAL LOW (ref 4.22–5.81)
RDW: 18.5 % — ABNORMAL HIGH (ref 11.5–15.5)
RDW: 18.6 % — ABNORMAL HIGH (ref 11.5–15.5)
WBC: 10.4 10*3/uL (ref 4.0–10.5)
WBC: 9.9 10*3/uL (ref 4.0–10.5)
nRBC: 0 % (ref 0.0–0.2)
nRBC: 0 % (ref 0.0–0.2)

## 2021-10-10 LAB — GLUCOSE, CAPILLARY
Glucose-Capillary: 153 mg/dL — ABNORMAL HIGH (ref 70–99)
Glucose-Capillary: 150 mg/dL — ABNORMAL HIGH (ref 70–99)
Glucose-Capillary: 132 mg/dL — ABNORMAL HIGH (ref 70–99)
Glucose-Capillary: 159 mg/dL — ABNORMAL HIGH (ref 70–99)

## 2021-10-10 MED ORDER — ISOSORBIDE MONONITRATE ER 30 MG PO TB24
30.0000 mg | ORAL_TABLET | Freq: Every day | ORAL | Status: DC
  Administered 2021-10-10 – 2021-10-11 (×2): 30 mg via ORAL
  Filled 2021-10-10 (×2): qty 1

## 2021-10-10 MED ORDER — ASPIRIN EC 81 MG PO TBEC
81.0000 mg | DELAYED_RELEASE_TABLET | Freq: Every day | ORAL | Status: DC
  Administered 2021-10-10 – 2021-10-11 (×2): 81 mg via ORAL
  Filled 2021-10-10 (×2): qty 1

## 2021-10-10 NOTE — Progress Notes (Signed)
Zachary Bowers 12:12 PM  Subjective: Patient seen and examined in hospital computer chart reviewed and specifically his endoscopy pictures were reviewed and case discussed with the hospital team as well as the patient and his wife and he had no endoscopic problems and we rediscussed the diagnosis and discussed the bleeding risks and his needs for blood thinners and answered all of their questions  Objective: Vital signs stable afebrile no acute distress patient not examined today and comfortably in the bed BUN and creatinine okay hemoglobin stable  Assessment: Ulcerative gastric mass   Plan: I have instructed them to call my partner Dr. Michail Sermon early next week if they have not heard about the biopsies and would recommend both pump inhibitors and Carafate to try to decrease the mass from bleeding and since he really had a slow ooze at home on 2 agents would expect that to continue in the future and agree with surgical consult and please let me know if I can be of any further assistance with this hospital stay  [facility]  office [phone] After 5PM or if no answer call [phone]

## 2021-10-10 NOTE — Progress Notes (Signed)
Progress Note  Patient Name: Zachary Bowers Date of Encounter: 10/10/2021  Primary Cardiologist: Evalina Field, MD   Subjective   Chest pain and improved and is only 2/10.  Depressed about his gastric mass but is coming to terms with it.  Inpatient Medications    Scheduled Meds:  amLODipine  10 mg Oral Daily   atorvastatin  80 mg Oral Daily   heparin  5,000 Units Subcutaneous Q8H   insulin aspart  0-20 Units Subcutaneous TID WC   irbesartan  150 mg Oral Daily   isosorbide mononitrate  15 mg Oral Daily   levothyroxine  125 mcg Oral Q0600   pantoprazole (PROTONIX) IV  40 mg Intravenous Q12H   Continuous Infusions:  sodium chloride 75 mL/hr at 10/09/21 2230   sodium chloride     PRN Meds: acetaminophen **OR** acetaminophen, ketorolac, nitroGLYCERIN   Vital Signs    Vitals:   10/09/21 2000 10/09/21 2359 10/10/21 0502 10/10/21 0712  BP: 130/75 112/71 120/73 (!) 144/79  Pulse: 80 78 76 92  Resp: 17 20 16  (!) 22  Temp: 98.5 F (36.9 C) 99 F (37.2 C) 98.5 F (36.9 C) 98.6 F (37 C)  TempSrc: Oral Oral Oral Oral  SpO2: 97% 95% 96% 96%  Weight:      Height:        Intake/Output Summary (Last 24 hours) at 10/10/2021 0739 Last data filed at 10/10/2021 7672 Gross per 24 hour  Intake 2718.98 ml  Output 2 ml  Net 2716.98 ml   Filed Weights   10/09/21 0843  Weight: 106.6 kg    Telemetry    SR and one, 4 beat run of NSVT, rare PVC - Personally Reviewed  ECG    No new - Personally Reviewed  Physical Exam   GEN: No acute distress.   Neck: No JVD Cardiac: RRR, no murmurs, rubs, or gallops.  Respiratory: Clear to auscultation bilaterally. GI: Soft, nontender, non-distended  MS: No edema; No deformity. Neuro:  Nonfocal  Psych: Normal affect   Labs    Chemistry Recent Labs  Lab 10/07/21 1659 10/09/21 1048 10/10/21 0549  NA 139 136 132*  K 4.0 4.0 3.8  CL 109 104 103  CO2 24 26 22   GLUCOSE 82 114* 144*  BUN 8 8 7*  CREATININE 1.10 1.21 1.17   CALCIUM 9.3 9.1 9.0  GFRNONAA >60 >60 >60  ANIONGAP 6 6 7      Hematology Recent Labs  Lab 10/09/21 1048 10/09/21 1648 10/10/21 0549  WBC 9.6 9.9 10.4  RBC 4.19* 3.95* 3.86*  HGB 10.0* 9.6* 9.4*  HCT 32.3* 30.4* 29.7*  MCV 77.1* 77.0* 76.9*  MCH 23.9* 24.3* 24.4*  MCHC 31.0 31.6 31.6  RDW 17.6* 17.8* 18.5*  PLT 475* 481* 461*    Cardiac EnzymesNo results for input(s): TROPONINI in the last 168 hours. No results for input(s): TROPIPOC in the last 168 hours.   BNPNo results for input(s): BNP, PROBNP in the last 168 hours.   DDimer No results for input(s): DDIMER in the last 168 hours.   Radiology    No results found.  Assessment & Plan    CAD DES x 2 to mid and distal RCA Anemia with gastric mass - PCI on 08/11/21 - has completed less than 2 months of DAPT with ASA and plavix - Hgb above 8 without further bleeding  - when able per GI would add back ASA, ideally would be on DAPT if this can be tolerated - we  have discussed surgical risk stratification in 10/09/21 evaluation  Hypertension - continue amlodipine and ARB - Imdur 130 mg PO daily; can increase for angina   [facility] will sign off.   Medication Recommendations:  DAPT, or at least ASA when cleared by GI, Imdur for angina Other recommendations (labs, testing, etc):  NA Follow up as an outpatient:  12/2021- presently cardiac therapy is limited due to above, we are happy to arrange closer follow up if this is no longer the case   For questions or updates, please contact [place] Please consult [email or web address] for contact info under Cardiology/STEMI.      Signed, Werner Lean, MD  10/10/2021, 7:39 AM

## 2021-10-10 NOTE — Consult Note (Signed)
Consulting Physician: Nickola Major Gissel Keilman  Referring Provider: Dr. Doristine Bosworth [facility]  Chief Complaint: Weakness, low HGB outpatient lab  Reason for Consult: Gastric mass   Subjective   HPI: Zachary Bowers is an 65 y.o. male who is here for weakness and low hemoglobin on outpatient labwork.  EGD demonstrated a fungating gastric mass.  He is tolerating a diet and hopeful for discharge home.  Past Medical History:  Diagnosis Date   Coronary artery disease    Diabetes mellitus without complication ([place])    Hyperlipidemia    Hypertension    Hypogonadism in male    OSA (obstructive sleep apnea)    Thyroid disease     Past Surgical History:  Procedure Laterality Date   APPENDECTOMY     APPENDECTOMY Right    CARDIAC CATHETERIZATION     CORONARY STENT INTERVENTION N/A 08/11/2021   Procedure: CORONARY STENT INTERVENTION;  Surgeon: Jettie Booze, MD;  Location: [place] [facility];  Service: Cardiovascular;  Laterality: N/A;   INTRAVASCULAR ULTRASOUND/IVUS N/A 08/11/2021   Procedure: Intravascular Ultrasound/IVUS;  Surgeon: Jettie Booze, MD;  Location: [place] [facility];  Service: Cardiovascular;  Laterality: N/A;   LEFT HEART CATH AND CORONARY ANGIOGRAPHY N/A 08/11/2021   Procedure: LEFT HEART CATH AND CORONARY ANGIOGRAPHY;  Surgeon: Jettie Booze, MD;  Location: [place] [facility];  Service: Cardiovascular;  Laterality: N/A;    Family History  Problem Relation Age of Onset   Heart disease Mother    Alzheimer's disease Father     Social:  reports that he has quit smoking. He has quit using smokeless tobacco.  His smokeless tobacco use included chew. He reports that he does not drink alcohol and does not use drugs.  Allergies:  Allergies  Allergen Reactions   Niaspan [Niacin Er] Itching    Medications: Current Outpatient Medications  Medication Instructions   amLODipine (NORVASC) 10 mg, Oral, Daily   aspirin 81 mg, Daily   atorvastatin (LIPITOR) 80  mg, Oral, Daily   clopidogrel (PLAVIX) 75 mg, Oral, Daily with breakfast   glimepiride (AMARYL) 4 mg, Oral, Every morning   glucose blood test strip 1 each, Other, Check blood sugar every morning   levothyroxine (SYNTHROID) 125 mcg, Oral, Daily before breakfast   metFORMIN (GLUCOPHAGE) 500-1,000 mg, Oral, See admin instructions, 1000 mg in the morning<BR>500 mg with dinner as needed if sugar is low   nitroGLYCERIN (NITROSTAT) 0.4 mg, Sublingual, Every 5 min PRN   telmisartan (MICARDIS) 40 mg, Oral, Daily    ROS - all of the below systems have been reviewed with the patient and positives are indicated with bold text General: chills, fever or night sweats Eyes: blurry vision or double vision ENT: epistaxis or sore throat Allergy/Immunology: itchy/watery eyes or nasal congestion Hematologic/Lymphatic: bleeding problems, blood clots or swollen lymph nodes Endocrine: temperature intolerance or unexpected weight changes Breast: new or changing breast lumps or nipple discharge Resp: cough, shortness of breath, or wheezing CV: chest pain or dyspnea on exertion GI: as per HPI GU: dysuria, trouble voiding, or hematuria MSK: joint pain or joint stiffness Neuro: TIA or stroke symptoms Derm: pruritus and skin lesion changes Psych: anxiety and depression  Objective   PE Blood pressure 120/77, pulse 87, temperature 98.7 F (37.1 C), temperature source Oral, resp. rate 20, height 6' (1.829 m), weight 106.6 kg, SpO2 97 %. Constitutional: NAD; conversant; no deformities Eyes: Moist conjunctiva; no lid lag; anicteric; PERRL Neck: Trachea midline; no thyromegaly Lungs: Normal respiratory effort;  no tactile fremitus CV: RRR; no palpable thrills; no pitting edema GI: Abd Soft, non-tender; no palpable hepatosplenomegaly MSK: Normal range of motion of extremities; no clubbing/cyanosis Psychiatric: Appropriate affect; alert and oriented x3 Lymphatic: No palpable cervical or axillary  lymphadenopathy  Results for orders placed or performed during the hospital encounter of 10/07/21 (from the past 24 hour(s))  Glucose, capillary     Status: Abnormal   Collection Time: 10/09/21  4:45 PM  Result Value Ref Range   Glucose-Capillary 163 (H) 70 - 99 mg/dL  CBC     Status: Abnormal   Collection Time: 10/09/21  4:48 PM  Result Value Ref Range   WBC 9.9 4.0 - 10.5 K/uL   RBC 3.95 (L) 4.22 - 5.81 MIL/uL   Hemoglobin 9.6 (L) 13.0 - 17.0 g/dL   HCT 30.4 (L) 39.0 - 52.0 %   MCV 77.0 (L) 80.0 - 100.0 fL   MCH 24.3 (L) 26.0 - 34.0 pg   MCHC 31.6 30.0 - 36.0 g/dL   RDW 17.8 (H) 11.5 - 15.5 %   Platelets 481 (H) 150 - 400 K/uL   nRBC 0.0 0.0 - 0.2 %  Glucose, capillary     Status: Abnormal   Collection Time: 10/09/21  9:03 PM  Result Value Ref Range   Glucose-Capillary 150 (H) 70 - 99 mg/dL  CBC     Status: Abnormal   Collection Time: 10/10/21  5:49 AM  Result Value Ref Range   WBC 10.4 4.0 - 10.5 K/uL   RBC 3.86 (L) 4.22 - 5.81 MIL/uL   Hemoglobin 9.4 (L) 13.0 - 17.0 g/dL   HCT 29.7 (L) 39.0 - 52.0 %   MCV 76.9 (L) 80.0 - 100.0 fL   MCH 24.4 (L) 26.0 - 34.0 pg   MCHC 31.6 30.0 - 36.0 g/dL   RDW 18.5 (H) 11.5 - 15.5 %   Platelets 461 (H) 150 - 400 K/uL   nRBC 0.0 0.0 - 0.2 %  Basic metabolic panel     Status: Abnormal   Collection Time: 10/10/21  5:49 AM  Result Value Ref Range   Sodium 132 (L) 135 - 145 mmol/L   Potassium 3.8 3.5 - 5.1 mmol/L   Chloride 103 98 - 111 mmol/L   CO2 22 22 - 32 mmol/L   Glucose, Bld 144 (H) 70 - 99 mg/dL   BUN 7 (L) 8 - 23 mg/dL   Creatinine, Ser 1.17 0.61 - 1.24 mg/dL   Calcium 9.0 8.9 - 10.3 mg/dL   GFR, Estimated >60 >60 mL/min   Anion gap 7 5 - 15  Glucose, capillary     Status: Abnormal   Collection Time: 10/10/21  6:06 AM  Result Value Ref Range   Glucose-Capillary 153 (H) 70 - 99 mg/dL  Glucose, capillary     Status: Abnormal   Collection Time: 10/10/21 11:28 AM  Result Value Ref Range   Glucose-Capillary 150 (H) 70 - 99  mg/dL     Imaging Orders         CT ABDOMEN PELVIS W CONTRAST    1. No acute findings are noted to account for the patient's right lower quadrant abdominal pain. However, there is a probable mass in the proximal stomach just distal to the gastroesophageal junction. Adjacent to this in the gastrohepatic ligament there is an enlarged lymph node. Findings are concerning for possible primary gastric neoplasm with local nodal metastasis. Further evaluation with endoscopy is strongly recommended in the near future to better evaluate  this finding. 2. Aortic atherosclerosis, in addition to least 3 vessel coronary artery disease. Please note that although the presence of coronary artery calcium documents the presence of coronary artery disease, the severity of this disease and any potential stenosis cannot be assessed on this non-gated CT examination. Assessment for potential risk factor modification, dietary therapy or pharmacologic therapy may be warranted, if clinically indicated.    Assessment and Plan   Zachary Bowers is an 65 y.o. male with a fungating gastric mass.  Unfortunately not able to provide much in the way of recommendations until the pathology returns.  Discussed with the patient in broad terms the different tumor pathologies of the stomach.  Unfortunately the endoscopic and CT description of this tumor suggest adenocarcinoma with lymph node metastasis which would not be amenable to upfront surgery.  The surgery team can provide additional recommendations once the pathology report is back.  We could provide these recommendations as an outpatient if the patient is discharged prior to the results, he can call the office on Monday to schedule an appointment with Dr. Barry Dienes or Dr. Zenia Resides.   Felicie Morn, MD  [facility] Use [email or web address] to contact on call provider

## 2021-10-10 NOTE — Plan of Care (Signed)

## 2021-10-10 NOTE — Progress Notes (Addendum)
PROGRESS NOTE    Zachary Bowers  MGQ:[id number] DOB: 02/13/1956 DOA: 10/07/2021 PCP: Shirline Frees, MD   Brief Narrative:  HPI: Zachary Bowers is a 65 y.o. male with medical history significant of CAD/MI s/p  PCI w/ stents, HLD, DM, HTN who reports two months of progressive weakness and fatigue. He was seen by his PCP Monday, October 24th. Lab revealed significant anemia. He also reports a very recent h/o abdominal pain. He was advised to be evaluated in ED for anemia.     ED Course: T 97.9  130/82  HR 79  RR 22. Lab with nl Bmet, Hgb 7, Plt 486 He was typed and cross-matched. CT abdomen revealed a gastric mass 4.8x3.4x2.9 and a gastro-hepatic lymphnode 2.1x2.7x2.3. TRH called to admit for further management and treatment.   Assessment & Plan:   Active Problems:   Pure hypercholesterolemia   Essential hypertension, benign   Symptomatic anemia   Gastric mass  Chronic blood loss anemia/gastric mass: Presented with hemoglobin of 7.  Received 2 units of PRBC.  Hemoglobin improved post transfusion to 9.8 and has remained stable.  No further melanotic stool reported.  Cardiology recommends initiating at least low-dose aspirin 81 mg if GI clears, discussed with cardiology and GI Dr. Driscilla Grammes over secure chat and then personally over the phone and although it is not ideal but unfortunately we do not have any other choice.  We will introduce aspirin 81 mg today after consulting with GI and cardiology and keep patient here for monitoring.  Patient informed of the decision and he agrees with the plan.  I have also consulted surgery to see him.  He is requesting to let him eat something, will put him on soft diet.  History of CAD/MI s/p PCI with stents on 08/11/2021: Currently aspirin and Plavix both on hold and cardiology on board.  Resuming aspirin after discussing with GI and cardiology.  Hyperlipidemia: Continue atorvastatin.  Hypertension: Controlled.  Continue amlodipine, Avapro, Imdur and  rest of the medications.  Hypothyroidism: Continue Synthroid.  DVT prophylaxis: heparin injection 5,000 Units Start: 10/08/21 0745   Code Status: Full Code  Family Communication: Wife present at bedside.  Plan of care discussed with patient in length and he verbalized understanding and agreed with it.  Status is: Inpatient  Remains inpatient appropriate because: Needs further work-up.   Estimated body mass index is 31.87 kg/m as calculated from the following:   Height as of this encounter: 6' (1.829 m).   Weight as of this encounter: 106.6 kg.     Nutritional Assessment: Body mass index is 31.87 kg/m.Marland Kitchen Seen by dietician.  I agree with the assessment and plan as outlined below: Nutrition Status:   Skin Assessment: I have examined the patient's skin and I agree with the wound assessment as performed by the wound care RN as outlined below:    Consultants:  GI  Procedures:  EGD  Antimicrobials:  Anti-infectives (From admission, onward)    None          Subjective:  Patient seen and examined.  He has no complaints.  Wife at the bedside.  Objective: Vitals:   10/09/21 2000 10/09/21 2359 10/10/21 0502 10/10/21 0712  BP: 130/75 112/71 120/73 (!) 144/79  Pulse: 80 78 76 92  Resp: 17 20 16  (!) 22  Temp: 98.5 F (36.9 C) 99 F (37.2 C) 98.5 F (36.9 C) 98.6 F (37 C)  TempSrc: Oral Oral Oral Oral  SpO2: 97% 95% 96% 96%  Weight:  Height:        Intake/Output Summary (Last 24 hours) at 10/10/2021 1045 Last data filed at 10/10/2021 0713 Gross per 24 hour  Intake 1443.46 ml  Output 2 ml  Net 1441.46 ml    Filed Weights   10/09/21 0843  Weight: 106.6 kg    Examination:  General exam: Appears calm and comfortable  Respiratory system: Clear to auscultation. Respiratory effort normal. Cardiovascular system: S1 & S2 heard, RRR. No JVD, murmurs, rubs, gallops or clicks. No pedal edema. Gastrointestinal system: Abdomen is nondistended, soft and mild  to moderate periumbilical, right upper quadrant and epigastric tenderness. No organomegaly or masses felt. Normal bowel sounds heard. Central nervous system: Alert and oriented. No focal neurological deficits. Extremities: Symmetric 5 x 5 power. Skin: No rashes, lesions or ulcers.  Psychiatry: Judgement and insight appear normal. Mood & affect appropriate.    Data Reviewed: I have personally reviewed following labs and imaging studies  CBC: Recent Labs  Lab 10/07/21 1659 10/08/21 1823 10/09/21 1048 10/09/21 1648 10/10/21 0549  WBC 7.3  --  9.6 9.9 10.4  NEUTROABS  --   --  6.0  --   --   HGB 7.0* 9.8* 10.0* 9.6* 9.4*  HCT 24.1* 31.4* 32.3* 30.4* 29.7*  MCV 75.5*  --  77.1* 77.0* 76.9*  PLT 486*  --  475* 481* 461*    Basic Metabolic Panel: Recent Labs  Lab 10/07/21 1659 10/09/21 1048 10/10/21 0549  NA 139 136 132*  K 4.0 4.0 3.8  CL 109 104 103  CO2 24 26 22   GLUCOSE 82 114* 144*  BUN 8 8 7*  CREATININE 1.10 1.21 1.17  CALCIUM 9.3 9.1 9.0    GFR: Estimated Creatinine Clearance: 78.4 mL/min (by C-G formula based on SCr of 1.17 mg/dL). Liver Function Tests: No results for input(s): AST, ALT, ALKPHOS, BILITOT, PROT, ALBUMIN in the last 168 hours. No results for input(s): LIPASE, AMYLASE in the last 168 hours. No results for input(s): AMMONIA in the last 168 hours. Coagulation Profile: No results for input(s): INR, PROTIME in the last 168 hours. Cardiac Enzymes: No results for input(s): CKTOTAL, CKMB, CKMBINDEX, TROPONINI in the last 168 hours. BNP (last 3 results) No results for input(s): PROBNP in the last 8760 hours. HbA1C: Recent Labs    10/08/21 0735  HGBA1C 6.8*    CBG: Recent Labs  Lab 10/09/21 0956 10/09/21 1124 10/09/21 1645 10/09/21 2103 10/10/21 0606  GLUCAP 112* 116* 163* 150* 153*    Lipid Profile: No results for input(s): CHOL, HDL, LDLCALC, TRIG, CHOLHDL, LDLDIRECT in the last 72 hours. Thyroid Function Tests: No results for  input(s): TSH, T4TOTAL, FREET4, T3FREE, THYROIDAB in the last 72 hours. Anemia Panel: No results for input(s): VITAMINB12, FOLATE, FERRITIN, TIBC, IRON, RETICCTPCT in the last 72 hours. Sepsis Labs: No results for input(s): PROCALCITON, LATICACIDVEN in the last 168 hours.  Recent Results (from the past 240 hour(s))  Resp Panel by RT-PCR (Flu A&B, Covid) Nasopharyngeal Swab     Status: None   Collection Time: 10/08/21  2:06 AM   Specimen: Nasopharyngeal Swab; Nasopharyngeal(NP) swabs in vial transport medium  Result Value Ref Range Status   SARS Coronavirus 2 by RT PCR NEGATIVE NEGATIVE Final    Comment: (NOTE) SARS-CoV-2 target nucleic acids are NOT DETECTED.  The SARS-CoV-2 RNA is generally detectable in upper respiratory specimens during the acute phase of infection. The lowest concentration of SARS-CoV-2 viral copies this assay can detect is 138 copies/mL. A negative result does  not preclude SARS-Cov-2 infection and should not be used as the sole basis for treatment or other patient management decisions. A negative result may occur with  improper specimen collection/handling, submission of specimen other than nasopharyngeal swab, presence of viral mutation(s) within the areas targeted by this assay, and inadequate number of viral copies(<138 copies/mL). A negative result must be combined with clinical observations, patient history, and epidemiological information. The expected result is Negative.  Fact Sheet for Patients:  EntrepreneurPulse.com.au  Fact Sheet for Healthcare Providers:  IncredibleEmployment.be  This test is no t yet approved or cleared by the [place] FDA and  has been authorized for detection and/or diagnosis of SARS-CoV-2 by FDA under an Emergency Use Authorization (EUA). This EUA will remain  in effect (meaning this test can be used) for the duration of the COVID-19 declaration under Section 564(b)(1) of the Act,  21 U.S.C.section 360bbb-3(b)(1), unless the authorization is terminated  or revoked sooner.       Influenza A by PCR NEGATIVE NEGATIVE Final   Influenza B by PCR NEGATIVE NEGATIVE Final    Comment: (NOTE) The Xpert Xpress SARS-CoV-2/FLU/RSV plus assay is intended as an aid in the diagnosis of influenza from Nasopharyngeal swab specimens and should not be used as a sole basis for treatment. Nasal washings and aspirates are unacceptable for Xpert Xpress SARS-CoV-2/FLU/RSV testing.  Fact Sheet for Patients: EntrepreneurPulse.com.au  Fact Sheet for Healthcare Providers: IncredibleEmployment.be  This test is not yet approved or cleared by the [place] FDA and has been authorized for detection and/or diagnosis of SARS-CoV-2 by FDA under an Emergency Use Authorization (EUA). This EUA will remain in effect (meaning this test can be used) for the duration of the COVID-19 declaration under Section 564(b)(1) of the Act, 21 U.S.C. section 360bbb-3(b)(1), unless the authorization is terminated or revoked.  Performed at [facility], [place] [address] [place], [place] [address]   MRSA Next Gen by PCR, Nasal     Status: None   Collection Time: 10/08/21  8:38 PM   Specimen: Nasal Mucosa; Nasal Swab  Result Value Ref Range Status   MRSA by PCR Next Gen NOT DETECTED NOT DETECTED Final    Comment: (NOTE) The GeneXpert MRSA Assay (FDA approved for NASAL specimens only), is one component of a comprehensive MRSA colonization surveillance program. It is not intended to diagnose MRSA infection nor to guide or monitor treatment for MRSA infections. Test performance is not FDA approved in patients less than 8 years old. Performed at [facility], [place] [address] [place], [place] [address]        Radiology Studies: No results found.  Scheduled Meds:  amLODipine  10 mg Oral Daily   atorvastatin  80 mg Oral Daily   heparin  5,000 Units  Subcutaneous Q8H   insulin aspart  0-20 Units Subcutaneous TID WC   irbesartan  150 mg Oral Daily   isosorbide mononitrate  30 mg Oral Daily   levothyroxine  125 mcg Oral Q0600   pantoprazole (PROTONIX) IV  40 mg Intravenous Q12H   Continuous Infusions:  sodium chloride 75 mL/hr at 10/09/21 2230   sodium chloride       LOS: 2 days   Time spent: 30 minutes   Darliss Cheney, MD [facility]  10/10/2021, 10:45 AM  Please page via Shea Evans and do not message via secure chat for anything urgent. Secure chat can be used for anything non urgent.  How to contact the [facility] Attending or Consulting  provider [place] or covering provider during after hours [place], for this patient?  Check the care team in [facility] and look for a) attending/consulting [facility] provider listed and b) the [facility] team listed. Page or secure chat [phone]. Log into [email or web address] and use [place] universal password to access. If you do not have the password, please contact the hospital operator. Locate the [facility] provider you are looking for under [facility] and page to a number that you can be directly reached. If you still have difficulty reaching the provider, please page the [facility] (Director on Call) for the Hospitalists listed on amion for assistance.

## 2021-10-11 DIAGNOSIS — I25118 Atherosclerotic heart disease of native coronary artery with other forms of angina pectoris: Secondary | ICD-10-CM | POA: Diagnosis not present

## 2021-10-11 DIAGNOSIS — D649 Anemia, unspecified: Secondary | ICD-10-CM | POA: Diagnosis not present

## 2021-10-11 DIAGNOSIS — I10 Essential (primary) hypertension: Secondary | ICD-10-CM | POA: Diagnosis not present

## 2021-10-11 DIAGNOSIS — K3189 Other diseases of stomach and duodenum: Secondary | ICD-10-CM | POA: Diagnosis not present

## 2021-10-11 LAB — CBC
HCT: 29.2 % — ABNORMAL LOW (ref 39.0–52.0)
Hemoglobin: 9.1 g/dL — ABNORMAL LOW (ref 13.0–17.0)
MCH: 24.1 pg — ABNORMAL LOW (ref 26.0–34.0)
MCHC: 31.2 g/dL (ref 30.0–36.0)
MCV: 77.5 fL — ABNORMAL LOW (ref 80.0–100.0)
Platelets: 418 10*3/uL — ABNORMAL HIGH (ref 150–400)
RBC: 3.77 MIL/uL — ABNORMAL LOW (ref 4.22–5.81)
RDW: 18.8 % — ABNORMAL HIGH (ref 11.5–15.5)
WBC: 9.4 10*3/uL (ref 4.0–10.5)
nRBC: 0 % (ref 0.0–0.2)

## 2021-10-11 LAB — GLUCOSE, CAPILLARY: Glucose-Capillary: 134 mg/dL — ABNORMAL HIGH (ref 70–99)

## 2021-10-11 MED ORDER — ISOSORBIDE MONONITRATE ER 30 MG PO TB24: 30.0000 mg | ORAL_TABLET | Freq: Every day | ORAL | 0 refills | Status: DC

## 2021-10-11 MED ORDER — SUCRALFATE 1 GM/10ML PO SUSP: 1.0000 g | Freq: Four times a day (QID) | ORAL | 0 refills | Status: DC

## 2021-10-11 MED ORDER — OMEPRAZOLE 40 MG PO CPDR: 40.0000 mg | DELAYED_RELEASE_CAPSULE | Freq: Two times a day (BID) | ORAL | 0 refills | Status: DC

## 2021-10-11 NOTE — Progress Notes (Signed)
Progress Note  Patient Name: Zachary Bowers Date of Encounter: 10/11/2021  Primary Cardiologist: Evalina Field, MD   Subjective   Post Sign off note; discussed case with on call GI and Primary team.  Given his complex coronary and GI disease, a decision was made to start asa 81 mg PO daily.  Other GI medications were started for bleeding risk.  No bleeding over night; no change in Hgb.  Inpatient Medications    Scheduled Meds:  amLODipine  10 mg Oral Daily   aspirin EC  81 mg Oral Daily   atorvastatin  80 mg Oral Daily   heparin  5,000 Units Subcutaneous Q8H   insulin aspart  0-20 Units Subcutaneous TID WC   irbesartan  150 mg Oral Daily   isosorbide mononitrate  30 mg Oral Daily   levothyroxine  125 mcg Oral Q0600   pantoprazole (PROTONIX) IV  40 mg Intravenous Q12H   Continuous Infusions:  sodium chloride 75 mL/hr at 10/11/21 0147   sodium chloride     PRN Meds: acetaminophen **OR** acetaminophen, ketorolac, nitroGLYCERIN   Vital Signs    Vitals:   10/10/21 2000 10/10/21 2318 10/11/21 0418 10/11/21 0725  BP:  113/66 130/80 (!) 150/90  Pulse: 79 85 86 90  Resp: 18 20 19  (!) 25  Temp:  98.2 F (36.8 C) 99.2 F (37.3 C) 98.2 F (36.8 C)  TempSrc:  Oral Oral   SpO2: 95% 96% 96%   Weight:      Height:       No intake or output data in the 24 hours ending 10/11/21 0746  Filed Weights   10/09/21 0843  Weight: 106.6 kg    Telemetry    Sinus rhythm Personally Reviewed  ECG    No new - Personally Reviewed  Physical Exam   GEN: No acute distress.   Neck: No JVD Cardiac: RRR, no murmurs, rubs, or gallops.  Respiratory: Clear to auscultation bilaterally. GI: Soft, nontender, non-distended  MS: No edema; No deformity. Neuro:  Nonfocal  Psych: Normal affect   Labs    Chemistry Recent Labs  Lab 10/07/21 1659 10/09/21 1048 10/10/21 0549  NA 139 136 132*  K 4.0 4.0 3.8  CL 109 104 103  CO2 24 26 22   GLUCOSE 82 114* 144*  BUN 8 8 7*   CREATININE 1.10 1.21 1.17  CALCIUM 9.3 9.1 9.0  GFRNONAA >60 >60 >60  ANIONGAP 6 6 7      Hematology Recent Labs  Lab 10/10/21 0549 10/10/21 1846 10/11/21 0520  WBC 10.4 9.9 9.4  RBC 3.86* 3.72* 3.77*  HGB 9.4* 9.1* 9.1*  HCT 29.7* 29.2* 29.2*  MCV 76.9* 78.5* 77.5*  MCH 24.4* 24.5* 24.1*  MCHC 31.6 31.2 31.2  RDW 18.5* 18.6* 18.8*  PLT 461* 430* 418*    Cardiac EnzymesNo results for input(s): TROPONINI in the last 168 hours. No results for input(s): TROPIPOC in the last 168 hours.   BNPNo results for input(s): BNP, PROBNP in the last 168 hours.   DDimer No results for input(s): DDIMER in the last 168 hours.   Radiology    No results found.  Assessment & Plan    CAD DES x 2 to mid and distal RCA Anemia with gastric mass - PCI on 08/11/21 - has completed less than 2 months of DAPT with ASA and plavix 0 restarted on ASA after multi-disciplinary discussion; will not pursue further DAPT; if bleeds again would have to trial no anti-platelet therapy until  his bleed issue resolved - we have discussed surgical risk stratification in 10/09/21 evaluation  Hypertension - continue amlodipine and ARB - Imdur 130 mg PO daily; can increase for angina   [facility] will sign off.   Medication Recommendations: asa 81 mg PO daily Other recommendations (labs, testing, etc):  NA Follow up as an outpatient:  12/2021- presently cardiac therapy is limited due to above, we are happy to arrange closer follow up if this is no longer the case   For questions or updates, please contact [place] Please consult [email or web address] for contact info under Cardiology/STEMI.      Signed, Werner Lean, MD  10/11/2021, 7:46 AM

## 2021-10-11 NOTE — Plan of Care (Signed)
  Problem: Clinical Measurements: Goal: Ability to maintain clinical measurements within normal limits will improve Outcome: Progressing   Problem: Clinical Measurements: Goal: Will remain free from infection Outcome: Progressing   Problem: Nutrition: Goal: Adequate nutrition will be maintained Outcome: Progressing   

## 2021-10-11 NOTE — Progress Notes (Signed)
No pathology result today.  Please call surgery team if it results, or have patient call office to schedule follow up with Dr. Barry Dienes or Dr. Zenia Resides to discuss outpatient.

## 2021-10-11 NOTE — Discharge Summary (Signed)
Physician Discharge Summary  Zachary Bowers LZJ:[id number] DOB: [birth year] DOA: 10/07/2021  PCP: Shirline Frees, MD  Admit date: 10/07/2021 Discharge date: 10/11/2021 30 Day Unplanned Readmission Risk Score    Flowsheet Row ED to Hosp-Admission (Current) from 10/07/2021 in [facility]  30 Day Unplanned Readmission Risk Score (%) 8.63 Filed at 10/11/2021 0801       This score is the patient's risk of an unplanned readmission within 30 days of being discharged (0 -100%). The score is based on dignosis, age, lab data, medications, orders, and past utilization.   Low:  0-14.9   Medium: 15-21.9   High: 22-29.9   Extreme: 30 and above          Admitted From: Home Disposition: Home  Recommendations for Outpatient Follow-up:  Follow up with PCP in 1-2 weeks Please obtain BMP/CBC in one week Please call GI late Tuesday or Wednesday if you do not hear from them about your biopsy results. Please call general surgery and make an appointment once you know your pathology/biopsy results. PCP to arrange appointment with oncology if pathology shows malignancy. Please follow up with your PCP on the following pending results: Unresulted Labs (From admission, onward)    None         Home Health: None Equipment/Devices: None  Discharge Condition: Stable CODE STATUS: Full code Diet recommendation: Cardiac  Subjective: Seen and examined.  He has no complaints.  His wife is at the bedside.  He denies having had any bowel movement since the one he had yesterday morning which was watery but not black or melanotic.  I discussed with the patient and his wife in length that it may be too early that we see any further bruising or signs of melanotic stool since he has had only 1 aspirin dose and that I recommended that he should stay 1 more night for monitoring purposes however patient is eager to go home and reassured me that he will keep a close eye on any symptoms of GI  bleed and will notify his physicians.  All questions answered.  He verbalized understanding of his situation.  His wife was also supportive of his decision.  Brief/Interim Summary: Reported Zachary Bowers is a 65 y.o. male with medical history significant of CAD/MI s/p PCI w/ stents on August 11, 2021, HLD, DM, HTN who reports two months of progressive weakness and fatigue. He was seen by his PCP Monday, October 24th. Lab revealed significant anemia. He also reported a very recent h/o abdominal pain. He was advised to be evaluated in ED for anemia.     Upon arrival to ED, he was hemodynamically stable Hgb 7, Plt 486 He was typed and cross-matched. CT abdomen revealed a gastric mass 4.8x3.4x2.9 and a gastro-hepatic lymphnode 2.1x2.7x2.3.  He was admitted under hospitalist service.  Received 2 units of PRBC. Hemoglobin improved post transfusion to 9.8 and has remained stable.  No further melanotic stool reported.  Cardiology recommended initiating at least low-dose aspirin 81 mg if GI clears, discussed with cardiology and GI Dr. Watt Climes over secure chat and then personally over the phone and although it is not ideal but unfortunately we do not have any other choice.  After multidisciplinary discussion between hospitalist, GI and cardiology, patient was started on aspirin 81 mg yesterday, he has not had further GI bleed and hemoglobin has remained stable.  Patient wants to go home, as discussed above.  He is being discharged on aspirin, he is very well aware  that he is not supposed to take Plavix.  He is being discharged on Protonix as well as Carafate per GI recommendations.  It is expected that GI will call him with the pathology results, if not then patient is supposed to call them by end of Tuesday or Wednesday.  He was also seen by surgery but since there is no pathology report available, they did not have anything to offer but patient is provided with phone numbers to call the surgery to schedule follow-up  appointment once his pathology results is known.   Discharge Diagnoses:  Active Problems:   Pure hypercholesterolemia   Essential hypertension, benign   Symptomatic anemia   Gastric mass    Discharge Instructions   Allergies as of 10/11/2021       Reactions   Niaspan [niacin Er] Itching        Medication List     STOP taking these medications    clopidogrel 75 MG tablet Commonly known as: PLAVIX       TAKE these medications    amLODipine 10 MG tablet Commonly known as: NORVASC Take 10 mg by mouth daily.   aspirin 81 MG tablet Take 81 mg by mouth daily.   atorvastatin 80 MG tablet Commonly known as: LIPITOR Take 1 tablet (80 mg total) by mouth daily.   glimepiride 4 MG tablet Commonly known as: AMARYL Take 4 mg by mouth every morning.   glucose blood test strip 1 each by Other route. Check blood sugar every morning   isosorbide mononitrate 30 MG 24 hr tablet Commonly known as: IMDUR Take 1 tablet (30 mg total) by mouth daily.   levothyroxine 125 MCG tablet Commonly known as: SYNTHROID Take 125 mcg by mouth daily before breakfast.   metFORMIN 500 MG tablet Commonly known as: GLUCOPHAGE Take 500-1,000 mg by mouth See admin instructions. 1000 mg in the morning 500 mg with dinner as needed if sugar is low   nitroGLYCERIN 0.4 MG SL tablet Commonly known as: NITROSTAT Place 1 tablet (0.4 mg total) under the tongue every 5 (five) minutes as needed for chest pain.   omeprazole 40 MG capsule Commonly known as: PRILOSEC Take 1 capsule (40 mg total) by mouth 2 (two) times daily.   sucralfate 1 GM/10ML suspension Commonly known as: Carafate Take 10 mLs (1 g total) by mouth 4 (four) times daily for 15 days.   telmisartan 40 MG tablet Commonly known as: MICARDIS Take 40 mg by mouth daily.        Follow-up Information     Shirline Frees, MD Follow up in 1 week(s).   Specialty: Family Medicine Contact information: [address] [place] [address] [phone]         Geralynn Rile, MD .   Specialties: Cardiology, Internal Medicine, Radiology Contact information: [place] [address] [phone]                Allergies  Allergen Reactions   Niaspan Durene Cal Er] Itching    Consultations: GI and cardiology   Procedures/Studies: DG Chest 2 View  Result Date: 10/06/2021 CLINICAL DATA:  65 year old male with history of acute onset of cough with wheezing for 1 week. EXAM: CHEST - 2 VIEW COMPARISON:  Chest x-ray 07/27/2021. FINDINGS: Lung volumes are normal. No consolidative airspace disease. No pleural effusions. No pneumothorax. No pulmonary nodule or mass noted. Pulmonary vasculature and the cardiomediastinal silhouette are within normal limits. IMPRESSION: No radiographic evidence of acute cardiopulmonary  disease. Electronically Signed   By: Vinnie Langton M.D.   On: 10/06/2021 11:08   CT ABDOMEN PELVIS W CONTRAST  Result Date: 10/08/2021 CLINICAL DATA:  65 year old male with history of anemia presenting with right lower quadrant abdominal pain. EXAM: CT ABDOMEN AND PELVIS WITH CONTRAST TECHNIQUE: Multidetector CT imaging of the abdomen and pelvis was performed using the standard protocol following bolus administration of intravenous contrast. CONTRAST:  47mL OMNIPAQUE IOHEXOL 300 MG/ML  SOLN COMPARISON:  No priors. FINDINGS: Lower chest: Mild scarring in the lung bases bilaterally. Atherosclerotic calcifications in the left anterior descending, left circumflex and right coronary arteries. Hepatobiliary: No suspicious cystic or solid hepatic lesions. No intra or extrahepatic biliary ductal dilatation. Gallbladder is normal in appearance. Pancreas: No pancreatic mass. No pancreatic ductal dilatation. No pancreatic or peripancreatic fluid collections or inflammatory changes. Spleen: Unremarkable. Adrenals/Urinary Tract: Bilateral kidneys and adrenal glands are normal in  appearance. No hydroureteronephrosis. Urinary bladder is normal in appearance. Stomach/Bowel: Potential soft tissue mass in the proximal stomach (axial image 20 of series 3 and coronal image 53 of series 6) estimated to measure approximately 4.8 x 3.4 x 2.9 cm. Distal stomach is otherwise normal in appearance. No pathologic dilatation of small bowel or colon. The appendix is not confidently identified and may be surgically absent. Regardless, there are no inflammatory changes noted adjacent to the cecum to suggest the presence of an acute appendicitis at this time. Vascular/Lymphatic: Aortic atherosclerosis, without evidence of aneurysm or dissection in the abdominal or pelvic vasculature. In the upper abdomen in the region of the gastrohepatic ligament (axial image 25 of series 3 and coronal image 46 of series 6) there is a soft tissue attenuation mass measuring 2.1 x 2.7 x 2.3 cm, likely an enlarged gastrohepatic ligament lymph node. No other definite lymphadenopathy noted elsewhere in the abdomen or pelvis. Reproductive: Prostate gland and seminal vesicles are unremarkable in appearance. Other: No significant volume of ascites.  No pneumoperitoneum. Musculoskeletal: There are no aggressive appearing lytic or blastic lesions noted in the visualized portions of the skeleton. IMPRESSION: 1. No acute findings are noted to account for the patient's right lower quadrant abdominal pain. However, there is a probable mass in the proximal stomach just distal to the gastroesophageal junction. Adjacent to this in the gastrohepatic ligament there is an enlarged lymph node. Findings are concerning for possible primary gastric neoplasm with local nodal metastasis. Further evaluation with endoscopy is strongly recommended in the near future to better evaluate this finding. 2. Aortic atherosclerosis, in addition to least 3 vessel coronary artery disease. Please note that although the presence of coronary artery calcium documents  the presence of coronary artery disease, the severity of this disease and any potential stenosis cannot be assessed on this non-gated CT examination. Assessment for potential risk factor modification, dietary therapy or pharmacologic therapy may be warranted, if clinically indicated. Electronically Signed   By: Vinnie Langton M.D.   On: 10/08/2021 05:42     Discharge Exam: Vitals:   10/11/21 0418 10/11/21 0725  BP: 130/80 (!) 150/90  Pulse: 86 90  Resp: 19 (!) 25  Temp: 99.2 F (37.3 C) 98.2 F (36.8 C)  SpO2: 96% 96%   Vitals:   10/10/21 2000 10/10/21 2318 10/11/21 0418 10/11/21 0725  BP:  113/66 130/80 (!) 150/90  Pulse: 79 85 86 90  Resp: 18 20 19  (!) 25  Temp:  98.2 F (36.8 C) 99.2 F (37.3 C) 98.2 F (36.8 C)  TempSrc:  Oral Oral Oral  SpO2: 95% 96% 96% 96%  Weight:      Height:        General: Pt is alert, awake, not in acute distress Cardiovascular: RRR, S1/S2 +, no rubs, no gallops Respiratory: CTA bilaterally, no wheezing, no rhonchi Abdominal: Soft, mild epigastric and right upper quadrant tenderness, ND, bowel sounds + Extremities: no edema, no cyanosis    The results of significant diagnostics from this hospitalization (including imaging, microbiology, ancillary and laboratory) are listed below for reference.     Microbiology: Recent Results (from the past 240 hour(s))  Resp Panel by RT-PCR (Flu A&B, Covid) Nasopharyngeal Swab     Status: None   Collection Time: 10/08/21  2:06 AM   Specimen: Nasopharyngeal Swab; Nasopharyngeal(NP) swabs in vial transport medium  Result Value Ref Range Status   SARS Coronavirus 2 by RT PCR NEGATIVE NEGATIVE Final    Comment: (NOTE) SARS-CoV-2 target nucleic acids are NOT DETECTED.  The SARS-CoV-2 RNA is generally detectable in upper respiratory specimens during the acute phase of infection. The lowest concentration of SARS-CoV-2 viral copies this assay can detect is 138 copies/mL. A negative result does not preclude  SARS-Cov-2 infection and should not be used as the sole basis for treatment or other patient management decisions. A negative result may occur with  improper specimen collection/handling, submission of specimen other than nasopharyngeal swab, presence of viral mutation(s) within the areas targeted by this assay, and inadequate number of viral copies(<138 copies/mL). A negative result must be combined with clinical observations, patient history, and epidemiological information. The expected result is Negative.  Fact Sheet for Patients:  EntrepreneurPulse.com.au  Fact Sheet for Healthcare Providers:  IncredibleEmployment.be  This test is no t yet approved or cleared by the [place] FDA and  has been authorized for detection and/or diagnosis of SARS-CoV-2 by FDA under an Emergency Use Authorization (EUA). This EUA will remain  in effect (meaning this test can be used) for the duration of the COVID-19 declaration under Section 564(b)(1) of the Act, 21 U.S.C.section 360bbb-3(b)(1), unless the authorization is terminated  or revoked sooner.       Influenza A by PCR NEGATIVE NEGATIVE Final   Influenza B by PCR NEGATIVE NEGATIVE Final    Comment: (NOTE) The Xpert Xpress SARS-CoV-2/FLU/RSV plus assay is intended as an aid in the diagnosis of influenza from Nasopharyngeal swab specimens and should not be used as a sole basis for treatment. Nasal washings and aspirates are unacceptable for Xpert Xpress SARS-CoV-2/FLU/RSV testing.  Fact Sheet for Patients: EntrepreneurPulse.com.au  Fact Sheet for Healthcare Providers: IncredibleEmployment.be  This test is not yet approved or cleared by the [place] FDA and has been authorized for detection and/or diagnosis of SARS-CoV-2 by FDA under an Emergency Use Authorization (EUA). This EUA will remain in effect (meaning this test can be used) for the duration of  the COVID-19 declaration under Section 564(b)(1) of the Act, 21 U.S.C. section 360bbb-3(b)(1), unless the authorization is terminated or revoked.  Performed at [facility], [place] [address] [place], [place] [address]   MRSA Next Gen by PCR, Nasal     Status: None   Collection Time: 10/08/21  8:38 PM   Specimen: Nasal Mucosa; Nasal Swab  Result Value Ref Range Status   MRSA by PCR Next Gen NOT DETECTED NOT DETECTED Final    Comment: (NOTE) The GeneXpert MRSA Assay (FDA approved for NASAL specimens only), is one component of a comprehensive MRSA colonization surveillance program. It is not intended to diagnose  MRSA infection nor to guide or monitor treatment for MRSA infections. Test performance is not FDA approved in patients less than 87 years old. Performed at [facility], [place] [address], [place], [place] [address]      Labs: BNP (last 3 results) No results for input(s): BNP in the last 8760 hours. Basic Metabolic Panel: Recent Labs  Lab 10/07/21 1659 10/09/21 1048 10/10/21 0549  NA 139 136 132*  K 4.0 4.0 3.8  CL 109 104 103  CO2 24 26 22   GLUCOSE 82 114* 144*  BUN 8 8 7*  CREATININE 1.10 1.21 1.17  CALCIUM 9.3 9.1 9.0   Liver Function Tests: No results for input(s): AST, ALT, ALKPHOS, BILITOT, PROT, ALBUMIN in the last 168 hours. No results for input(s): LIPASE, AMYLASE in the last 168 hours. No results for input(s): AMMONIA in the last 168 hours. CBC: Recent Labs  Lab 10/09/21 1048 10/09/21 1648 10/10/21 0549 10/10/21 1846 10/11/21 0520  WBC 9.6 9.9 10.4 9.9 9.4  NEUTROABS 6.0  --   --   --   --   HGB 10.0* 9.6* 9.4* 9.1* 9.1*  HCT 32.3* 30.4* 29.7* 29.2* 29.2*  MCV 77.1* 77.0* 76.9* 78.5* 77.5*  PLT 475* 481* 461* 430* 418*   Cardiac Enzymes: No results for input(s): CKTOTAL, CKMB, CKMBINDEX, TROPONINI in the last 168 hours. BNP: Invalid input(s): POCBNP CBG: Recent Labs  Lab 10/10/21 0606 10/10/21 1128 10/10/21 1622  10/10/21 2100 10/11/21 0606  GLUCAP 153* 150* 132* 159* 134*   D-Dimer No results for input(s): DDIMER in the last 72 hours. Hgb A1c No results for input(s): HGBA1C in the last 72 hours. Lipid Profile No results for input(s): CHOL, HDL, LDLCALC, TRIG, CHOLHDL, LDLDIRECT in the last 72 hours. Thyroid function studies No results for input(s): TSH, T4TOTAL, T3FREE, THYROIDAB in the last 72 hours.  Invalid input(s): FREET3 Anemia work up No results for input(s): VITAMINB12, FOLATE, FERRITIN, TIBC, IRON, RETICCTPCT in the last 72 hours. Urinalysis    Component Value Date/Time   COLORURINE YELLOW 10/07/2021 1659   APPEARANCEUR CLEAR 10/07/2021 1659   LABSPEC 1.019 10/07/2021 1659   PHURINE 6.0 10/07/2021 1659   GLUCOSEU NEGATIVE 10/07/2021 1659   HGBUR NEGATIVE 10/07/2021 [place] 10/07/2021 [place] 10/07/2021 1659   PROTEINUR NEGATIVE 10/07/2021 1659   NITRITE NEGATIVE 10/07/2021 1659   LEUKOCYTESUR NEGATIVE 10/07/2021 1659   Sepsis Labs Invalid input(s): PROCALCITONIN,  WBC,  LACTICIDVEN Microbiology Recent Results (from the past 240 hour(s))  Resp Panel by RT-PCR (Flu A&B, Covid) Nasopharyngeal Swab     Status: None   Collection Time: 10/08/21  2:06 AM   Specimen: Nasopharyngeal Swab; Nasopharyngeal(NP) swabs in vial transport medium  Result Value Ref Range Status   SARS Coronavirus 2 by RT PCR NEGATIVE NEGATIVE Final    Comment: (NOTE) SARS-CoV-2 target nucleic acids are NOT DETECTED.  The SARS-CoV-2 RNA is generally detectable in upper respiratory specimens during the acute phase of infection. The lowest concentration of SARS-CoV-2 viral copies this assay can detect is 138 copies/mL. A negative result does not preclude SARS-Cov-2 infection and should not be used as the sole basis for treatment or other patient management decisions. A negative result may occur with  improper specimen collection/handling, submission of specimen  other than nasopharyngeal swab, presence of viral mutation(s) within the areas targeted by this assay, and inadequate number of viral copies(<138 copies/mL). A negative result must be combined with clinical observations, patient history, and epidemiological  information. The expected result is Negative.  Fact Sheet for Patients:  EntrepreneurPulse.com.au  Fact Sheet for Healthcare Providers:  IncredibleEmployment.be  This test is no t yet approved or cleared by the [place] FDA and  has been authorized for detection and/or diagnosis of SARS-CoV-2 by FDA under an Emergency Use Authorization (EUA). This EUA will remain  in effect (meaning this test can be used) for the duration of the COVID-19 declaration under Section 564(b)(1) of the Act, 21 U.S.C.section 360bbb-3(b)(1), unless the authorization is terminated  or revoked sooner.       Influenza A by PCR NEGATIVE NEGATIVE Final   Influenza B by PCR NEGATIVE NEGATIVE Final    Comment: (NOTE) The Xpert Xpress SARS-CoV-2/FLU/RSV plus assay is intended as an aid in the diagnosis of influenza from Nasopharyngeal swab specimens and should not be used as a sole basis for treatment. Nasal washings and aspirates are unacceptable for Xpert Xpress SARS-CoV-2/FLU/RSV testing.  Fact Sheet for Patients: EntrepreneurPulse.com.au  Fact Sheet for Healthcare Providers: IncredibleEmployment.be  This test is not yet approved or cleared by the [place] FDA and has been authorized for detection and/or diagnosis of SARS-CoV-2 by FDA under an Emergency Use Authorization (EUA). This EUA will remain in effect (meaning this test can be used) for the duration of the COVID-19 declaration under Section 564(b)(1) of the Act, 21 U.S.C. section 360bbb-3(b)(1), unless the authorization is terminated or revoked.  Performed at [facility], [place] [address], [place],  [place] [address]   MRSA Next Gen by PCR, Nasal     Status: None   Collection Time: 10/08/21  8:38 PM   Specimen: Nasal Mucosa; Nasal Swab  Result Value Ref Range Status   MRSA by PCR Next Gen NOT DETECTED NOT DETECTED Final    Comment: (NOTE) The GeneXpert MRSA Assay (FDA approved for NASAL specimens only), is one component of a comprehensive MRSA colonization surveillance program. It is not intended to diagnose MRSA infection nor to guide or monitor treatment for MRSA infections. Test performance is not FDA approved in patients less than 71 years old. Performed at [facility], [place] [address], [place], [place] [address]      Time coordinating discharge: Over 30 minutes  SIGNED:   Darliss Cheney, MD  [facility] 10/11/2021, 8:49 AM  If 7PM-7AM, please contact night-coverage [email or web address]

## 2021-10-12 ENCOUNTER — Ambulatory Visit: Payer: Medicare PPO | Admitting: Cardiovascular Disease

## 2021-10-12 ENCOUNTER — Encounter (HOSPITAL_COMMUNITY): Payer: Self-pay | Admitting: Gastroenterology

## 2021-10-14 DIAGNOSIS — C169 Malignant neoplasm of stomach, unspecified: Secondary | ICD-10-CM | POA: Diagnosis not present

## 2021-10-14 DIAGNOSIS — D500 Iron deficiency anemia secondary to blood loss (chronic): Secondary | ICD-10-CM | POA: Diagnosis not present

## 2021-10-15 ENCOUNTER — Other Ambulatory Visit: Payer: Self-pay | Admitting: Adult Health

## 2021-10-16 ENCOUNTER — Encounter: Payer: Self-pay | Admitting: *Deleted

## 2021-10-16 NOTE — Progress Notes (Signed)
Spoke with patient and discussed upcoming appt with Dr Benay Spice, gave directions and instructions for parking, mask and one support person policy reviewed. Also that the appt will last approximately 1-2 hours. Verbalized understanding

## 2021-10-19 ENCOUNTER — Inpatient Hospital Stay: Payer: Medicare PPO

## 2021-10-19 ENCOUNTER — Encounter: Payer: Self-pay | Admitting: *Deleted

## 2021-10-19 ENCOUNTER — Inpatient Hospital Stay: Payer: Medicare PPO | Attending: Oncology | Admitting: Oncology

## 2021-10-19 ENCOUNTER — Other Ambulatory Visit: Payer: Self-pay

## 2021-10-19 VITALS — BP 139/86 | HR 83 | Temp 98.0°F | Resp 20 | Ht 72.0 in | Wt 233.6 lb

## 2021-10-19 DIAGNOSIS — E039 Hypothyroidism, unspecified: Secondary | ICD-10-CM | POA: Insufficient documentation

## 2021-10-19 DIAGNOSIS — C160 Malignant neoplasm of cardia: Secondary | ICD-10-CM | POA: Diagnosis not present

## 2021-10-19 DIAGNOSIS — D630 Anemia in neoplastic disease: Secondary | ICD-10-CM | POA: Insufficient documentation

## 2021-10-19 DIAGNOSIS — D500 Iron deficiency anemia secondary to blood loss (chronic): Secondary | ICD-10-CM | POA: Diagnosis not present

## 2021-10-19 DIAGNOSIS — E119 Type 2 diabetes mellitus without complications: Secondary | ICD-10-CM | POA: Insufficient documentation

## 2021-10-19 DIAGNOSIS — R935 Abnormal findings on diagnostic imaging of other abdominal regions, including retroperitoneum: Secondary | ICD-10-CM | POA: Diagnosis not present

## 2021-10-19 DIAGNOSIS — G4730 Sleep apnea, unspecified: Secondary | ICD-10-CM | POA: Diagnosis not present

## 2021-10-19 DIAGNOSIS — I2510 Atherosclerotic heart disease of native coronary artery without angina pectoris: Secondary | ICD-10-CM | POA: Insufficient documentation

## 2021-10-19 DIAGNOSIS — I10 Essential (primary) hypertension: Secondary | ICD-10-CM | POA: Insufficient documentation

## 2021-10-19 LAB — CBC WITH DIFFERENTIAL (CANCER CENTER ONLY)
Abs Immature Granulocytes: 0.02 10*3/uL (ref 0.00–0.07)
Basophils Absolute: 0.1 10*3/uL (ref 0.0–0.1)
Basophils Relative: 1 %
Eosinophils Absolute: 0.2 10*3/uL (ref 0.0–0.5)
Eosinophils Relative: 4 %
HCT: 32.1 % — ABNORMAL LOW (ref 39.0–52.0)
Hemoglobin: 9.7 g/dL — ABNORMAL LOW (ref 13.0–17.0)
Immature Granulocytes: 0 %
Lymphocytes Relative: 23 %
Lymphs Abs: 1.4 10*3/uL (ref 0.7–4.0)
MCH: 23.2 pg — ABNORMAL LOW (ref 26.0–34.0)
MCHC: 30.2 g/dL (ref 30.0–36.0)
MCV: 76.8 fL — ABNORMAL LOW (ref 80.0–100.0)
Monocytes Absolute: 0.7 10*3/uL (ref 0.1–1.0)
Monocytes Relative: 11 %
Neutro Abs: 3.8 10*3/uL (ref 1.7–7.7)
Neutrophils Relative %: 61 %
Platelet Count: 430 10*3/uL — ABNORMAL HIGH (ref 150–400)
RBC: 4.18 MIL/uL — ABNORMAL LOW (ref 4.22–5.81)
RDW: 18.4 % — ABNORMAL HIGH (ref 11.5–15.5)
WBC Count: 6.2 10*3/uL (ref 4.0–10.5)
nRBC: 0 % (ref 0.0–0.2)

## 2021-10-19 LAB — CEA (ACCESS): CEA (CHCC): 1.23 ng/mL (ref 0.00–5.00)

## 2021-10-19 LAB — SAMPLE TO BLOOD BANK

## 2021-10-19 NOTE — Progress Notes (Signed)
PATIENT NAVIGATOR PROGRESS NOTE  Name: Kristy Catoe Date: 10/19/2021 MRN: [id number]  DOB: November 28, 1956   Reason for visit:  Initial Dr Benay Spice  Comments:  Met with Mr and Mrs Dupras during initial visit with Dr Benay Spice and introduced myself as navigator.  Dr Benay Spice ordered to have PET scan, labs and consult with Dr Zenia Resides. His case will be presented at [facility] 11/16 with follow up visit on 11/16 as well. Handicap parking application completed and given to patient.     Time spent counseling/coordinating care: 45-60 minutes

## 2021-10-19 NOTE — Progress Notes (Signed)
[place] New Patient Consult   Requesting MD: Wilford Corner, [address] [place],  [place] [address]   Zachary Bowers 65 y.o.  03/11/1956    Reason for Consult: Gastric cancer   HPI: Zachary Bowers reports developing exertional dyspnea malaise several months ago.  He underwent placement of coronary artery stents 08/11/2021.  He saw his primary provider and was noted to have severe anemia.  He was referred to the emergency room for hospital admission.  On 10/07/2021 the hemoglobin returned at 7 with an MCV of 75.5.  He was transfused with 3 units of packed red blood cells. A CT of the abdomen pelvis on 10/08/2021 revealed a probable mass in the proximal stomach and an enlarged gastropathic ligament lymph node.  No evidence of distant metastatic disease.  Dr. Michail Sermon was consulted and he was taken to an upper endoscopy procedure on 10/09/2021.  A large circumferential mass with oozing and stigmata of recent bleeding was found in the cardia and gastric fundus.  Biopsies were obtained.  Sessile polyps with no bleeding were found in the gastric antrum. The pathology revealed at least intramucosal adenocarcinoma in a background of extensive high-grade dysplasia and intestinal metaplasia.  He reports feeling better after the Red cell transfusions.   Past Medical History:  Diagnosis Date   Coronary artery disease    Diabetes mellitus without complication ([place])    Hyperlipidemia    Hypertension    Hypogonadism in male    OSA (obstructive sleep apnea)    Thyroid disease     Past Surgical History:  Procedure Laterality Date   APPENDECTOMY     APPENDECTOMY Right    BIOPSY  10/09/2021   Procedure: BIOPSY;  Surgeon: Wilford Corner, MD;  Location: [facility] ENDOSCOPY;  Service: Endoscopy;;   CARDIAC CATHETERIZATION     CORONARY STENT INTERVENTION N/A 08/11/2021   Procedure: CORONARY STENT INTERVENTION;  Surgeon: Jettie Booze, MD;  Location: [place] CV LAB;  Service: Cardiovascular;  Laterality: N/A;   ESOPHAGOGASTRODUODENOSCOPY (EGD) WITH PROPOFOL N/A 10/09/2021   Procedure: ESOPHAGOGASTRODUODENOSCOPY (EGD) WITH PROPOFOL;  Surgeon: Wilford Corner, MD;  Location: [facility];  Service: Endoscopy;  Laterality: N/A;   INTRAVASCULAR ULTRASOUND/IVUS N/A 08/11/2021   Procedure: Intravascular Ultrasound/IVUS;  Surgeon: Jettie Booze, MD;  Location: [place] CV LAB;  Service: Cardiovascular;  Laterality: N/A;   LEFT HEART CATH AND CORONARY ANGIOGRAPHY N/A 08/11/2021   Procedure: LEFT HEART CATH AND CORONARY ANGIOGRAPHY;  Surgeon: Jettie Booze, MD;  Location: [place] [facility];  Service: Cardiovascular;  Laterality: N/A;    Medications: Reviewed  Allergies:  Allergies  Allergen Reactions   Niaspan [Niacin Er] Itching    Family history: Maternal uncle had a gastrointestinal cancer  Social History:   He lives with his wife in [place].  He is a retired Animal nutritionist.  He quit smoking cigarettes at age 64.  He quit using alcohol in his 30s.  No risk factor for HIV or hepatitis.  He received the J&J COVID-vaccine.  ROS:   Positives include: Exertional dyspnea for several months, increased "weakness "following placement of coronary artery stents in August, pressure in the subxiphoid region, intermittent black spots in the stool, cough  A complete ROS was otherwise negative.  Physical Exam:  Blood pressure 139/86, pulse 83, temperature 98 F (36.7 C), temperature source Oral, resp. rate 20, height 6' (1.829 m), weight 233 lb 9.6 oz (106 kg), SpO2 100 %.  HEENT: Oral  cavity without visible mass, neck without mass Lungs: Clear bilaterally Cardiac: Regular rate and rhythm Abdomen: No hepatosplenomegaly, tender in the mid upper abdomen, no mass GU: Testes without mass Vascular: No leg edema Lymph nodes: No cervical, supraclavicular, axillary, or inguinal nodes Neurologic: Alert and oriented, the  motor exam appears intact in the upper and lower extremities bilaterally Skin: No rash Musculoskeletal: No spine tenderness   LAB:  CBC  Lab Results  Component Value Date   WBC 6.2 10/19/2021   HGB 9.7 (L) 10/19/2021   HCT 32.1 (L) 10/19/2021   MCV 76.8 (L) 10/19/2021   PLT 430 (H) 10/19/2021   NEUTROABS 3.8 10/19/2021        CMP  Lab Results  Component Value Date   NA 132 (L) 10/10/2021   K 3.8 10/10/2021   CL 103 10/10/2021   CO2 22 10/10/2021   GLUCOSE 144 (H) 10/10/2021   BUN 7 (L) 10/10/2021   CREATININE 1.17 10/10/2021   CALCIUM 9.0 10/10/2021   GFRNONAA >60 10/10/2021     Imaging: CT images from 10/08/2021 reviewed with Zachary Bowers and his wife    Assessment/Plan:   Gastric cancer CT abdomen/pelvis 10/08/2021-probable mass in the proximal stomach, enlarged gastropathic ligament node Upper endoscopy 10/01/2021-gastric cardia/fundus mass with oozing and stigmata of recent bleeding-biopsy at least intramucosal adenocarcinoma Microcytic anemia secondary to #1 Diabetes Hypertension Coronary artery disease, cardiac catheterization 08/11/2021-placement of proximal and distal right coronary artery stents Hypothyroidism Sleep apnea   Disposition:   Zachary Bowers has been diagnosed with gastric cancer after presenting with symptomatic anemia.  He has a gastric cardia/fundus mass and an enlarged gastrohepatic lymph node.  I discussed treatment of gastric cancer with Zachary Bowers.  We will clarify the distance of the tumor from the GE junction with Dr. Michail Sermon to decide on treating the tumor as a primary gastric cancer versus a "GE junction/cardia "tumor.  He will be referred for a staging PET scan.  I will present his case at the GI tumor conference and he will return for an office visit on 10/28/2021.  He has persistent microcytic anemia, likely iron deficiency anemia secondary to chronic GI blood loss.  He will begin ferrous sulfate.  We will request HER-2 and  a PDL-1 combined positive score on the gastric biopsy.  Betsy Coder, MD  10/19/2021, 2:00 PM

## 2021-10-20 ENCOUNTER — Other Ambulatory Visit: Payer: Self-pay

## 2021-10-20 ENCOUNTER — Ambulatory Visit (HOSPITAL_COMMUNITY)
Admission: RE | Admit: 2021-10-20 | Discharge: 2021-10-20 | Disposition: A | Discharge status: Home / Self Care | Payer: Medicare PPO | Source: Ambulatory Visit | Attending: Oncology | Admitting: Oncology

## 2021-10-20 DIAGNOSIS — D500 Iron deficiency anemia secondary to blood loss (chronic): Secondary | ICD-10-CM | POA: Insufficient documentation

## 2021-10-20 DIAGNOSIS — C160 Malignant neoplasm of cardia: Secondary | ICD-10-CM | POA: Diagnosis not present

## 2021-10-20 LAB — GLUCOSE, CAPILLARY: Glucose-Capillary: 132 mg/dL — ABNORMAL HIGH (ref 70–99)

## 2021-10-20 MED ORDER — FLUDEOXYGLUCOSE F - 18 (FDG) INJECTION
11.7000 | Freq: Once | INTRAVENOUS | Status: AC
  Administered 2021-10-20: 11.63 via INTRAVENOUS

## 2021-10-21 DIAGNOSIS — C161 Malignant neoplasm of fundus of stomach: Secondary | ICD-10-CM | POA: Diagnosis not present

## 2021-10-21 DIAGNOSIS — D62 Acute posthemorrhagic anemia: Secondary | ICD-10-CM | POA: Diagnosis not present

## 2021-10-25 LAB — SURGICAL PATHOLOGY

## 2021-10-28 ENCOUNTER — Telehealth: Payer: Self-pay | Admitting: Radiation Oncology

## 2021-10-28 ENCOUNTER — Encounter: Payer: Self-pay | Admitting: *Deleted

## 2021-10-28 ENCOUNTER — Other Ambulatory Visit: Payer: Self-pay

## 2021-10-28 ENCOUNTER — Other Ambulatory Visit (HOSPITAL_BASED_OUTPATIENT_CLINIC_OR_DEPARTMENT_OTHER): Payer: Self-pay

## 2021-10-28 ENCOUNTER — Inpatient Hospital Stay: Payer: Medicare PPO

## 2021-10-28 ENCOUNTER — Inpatient Hospital Stay: Payer: Medicare PPO | Admitting: Oncology

## 2021-10-28 ENCOUNTER — Other Ambulatory Visit: Payer: Self-pay | Admitting: *Deleted

## 2021-10-28 ENCOUNTER — Ambulatory Visit: Payer: Medicare PPO | Attending: Internal Medicine

## 2021-10-28 VITALS — BP 142/85 | HR 81 | Temp 97.7°F | Resp 18 | Ht 72.0 in | Wt 237.4 lb

## 2021-10-28 DIAGNOSIS — R935 Abnormal findings on diagnostic imaging of other abdominal regions, including retroperitoneum: Secondary | ICD-10-CM | POA: Diagnosis not present

## 2021-10-28 DIAGNOSIS — I2510 Atherosclerotic heart disease of native coronary artery without angina pectoris: Secondary | ICD-10-CM | POA: Diagnosis not present

## 2021-10-28 DIAGNOSIS — Z23 Encounter for immunization: Secondary | ICD-10-CM

## 2021-10-28 DIAGNOSIS — K3189 Other diseases of stomach and duodenum: Secondary | ICD-10-CM | POA: Diagnosis not present

## 2021-10-28 DIAGNOSIS — E119 Type 2 diabetes mellitus without complications: Secondary | ICD-10-CM | POA: Diagnosis not present

## 2021-10-28 DIAGNOSIS — C160 Malignant neoplasm of cardia: Secondary | ICD-10-CM | POA: Diagnosis not present

## 2021-10-28 DIAGNOSIS — Z129 Encounter for screening for malignant neoplasm, site unspecified: Secondary | ICD-10-CM | POA: Diagnosis not present

## 2021-10-28 DIAGNOSIS — G4730 Sleep apnea, unspecified: Secondary | ICD-10-CM | POA: Diagnosis not present

## 2021-10-28 DIAGNOSIS — D630 Anemia in neoplastic disease: Secondary | ICD-10-CM | POA: Diagnosis not present

## 2021-10-28 DIAGNOSIS — E039 Hypothyroidism, unspecified: Secondary | ICD-10-CM | POA: Diagnosis not present

## 2021-10-28 DIAGNOSIS — I10 Essential (primary) hypertension: Secondary | ICD-10-CM | POA: Diagnosis not present

## 2021-10-28 MED ORDER — DEXAMETHASONE 2 MG PO TABS: 10.0000 mg | ORAL_TABLET | ORAL | 0 refills | Status: DC

## 2021-10-28 MED ORDER — PFIZER COVID-19 VAC BIVALENT 30 MCG/0.3ML IM SUSP
INTRAMUSCULAR | 0 refills | Status: DC
  Filled 2021-10-28: qty 0.3, 1d supply, fill #0

## 2021-10-28 MED ORDER — PROCHLORPERAZINE MALEATE 10 MG PO TABS: 10.0000 mg | ORAL_TABLET | Freq: Four times a day (QID) | ORAL | 0 refills | Status: DC | PRN

## 2021-10-28 NOTE — Progress Notes (Signed)
PATIENT NAVIGATOR PROGRESS NOTE  Name: Zachary Bowers Date: 10/28/2021 MRN: [id number]  DOB: March 19, 1956   Reason for visit:  F/U with Dr Benay Spice plan of care, discussion  Comments:  Patient discussed at GI conference this am, seen by Dr Zenia Resides, who agreed with plan of 5 weeks of Taxol/Carbo with radiation oncology. Referral sent for Rad/Onc consult.  Patient and family will return to clinic on 11/18 for Patient ed session and labs    Time spent counseling/coordinating care: 30-45 minutes

## 2021-10-28 NOTE — Progress Notes (Signed)
START OFF PATHWAY REGIMEN - Gastroesophageal   [id number]:Carboplatin + Paclitaxel (2/50) + RT weekly x 6 weeks:   Administer weekly during RT:     Paclitaxel      Carboplatin   **Always confirm dose/schedule in your pharmacy ordering system**  Patient Characteristics: Gastric, Adenocarcinoma, Preoperative or Nonsurgical Candidate (Clinical Staging), cT3 or Higher or cN+, Surgical Candidate (Up to cT4a), MSS/pMMR or MSI Unknown Histology: Adenocarcinoma Disease Classification: Gastric Therapeutic Status: Preoperative or Nonsurgical Candidate (Clinical Staging) AJCC N Category: Staged < 8th Ed. AJCC M Category: Staged < 8th Ed. AJCC 8 Stage Grouping: Staged < 8th Ed. AJCC T Category: Staged < 8th Ed. Microsatellite/Mismatch Repair Status: Unknown Intent of Therapy: Curative Intent, Discussed with Patient

## 2021-10-28 NOTE — Progress Notes (Signed)
GI Location of Tumor / Histology: Stomach Cancer  Zachary Bowers presented with SOB and dark stools.  He was seen by his PCP who ordered scans and blood work.  PET 10/20/2021: Proximal gastric primary with gastrohepatic ligament nodal metastasis.  4 mm left lower lobe pulmonary nodule.  Upper Endoscopy 10/09/2021: A large, fungating, circumferential mass with oozing bleeding and stigmata of recent bleeding was found in the cardia and in the gastric fundus.  A few medium sessile polyps with no bleeding and no stigmata of recent bleeding were found in the gastric antrum.  CT AP 10/08/2021: No acute findings are noted to account for the patient's right lower quadrant abdominal pain. However, there is a probable mass in the proximal stomach just distal to the gastroesophageal junction.  Adjacent to this in the gastrohepatic ligament there is an enlarged lymph node. Findings are concerning for possible primary gastric neoplasm with local nodal metastasis.    Biopsies of Proximal Stomach Mass 10/09/2021   Past/Anticipated interventions by surgeon, if any:   Past/Anticipated interventions by medical oncology, if any:  Dr. Benay Spice 10/28/2021 -His case was presented at the GI tumor conference yesterday.   -The conference recommendation is to proceed with neoadjuvant chemotherapy/radiation to be followed by surgery.   -He has seen Dr. Zenia Resides.   -If the tumor were located more distally in the stomach I would recommend neoadjuvant chemotherapy alone. -I recommend weekly Taxol/carboplatin chemotherapy and concurrent radiation. -We will plan to begin Taxol/carboplatin during the week of 11/09/2021.    Weight changes, if any: Steady fluctuations  Bowel/Bladder complaints, if any: Denies changes  Nausea / Vomiting, if any: No  Pain issues, if any: Occasional pain at the top of his stomach.  Diet: No   SAFETY ISSUES: Prior radiation? No Pacemaker/ICD? No Possible current pregnancy? N/a Is the  patient on methotrexate? No  Current Complaints/Details:

## 2021-10-28 NOTE — Progress Notes (Signed)
The proposed treatment discussed in conference is for discussion purpose only and is not a binding recommendation.  The patients have not been physically examined, or presented with their treatment options.  Therefore, final treatment plans cannot be decided.  

## 2021-10-28 NOTE — Progress Notes (Addendum)
  Zach[facility] OFFICE PROGRESS NOTE   Diagnosis: Gastric cancer  INTERVAL HISTORY:   Zachary Bowers returns as scheduled.  He is here with his wife and daughter.  No new complaint.  He has dark stool.  He is taking iron.  He has an improved energy level.  Objective:  Vital signs in last 24 hours:  Blood pressure (!) 142/85, pulse 81, temperature 97.7 F (36.5 C), temperature source Oral, resp. rate 18, height 6' (1.829 m), weight 237 lb 6.4 oz (107.7 kg), SpO2 100 %.   Resp: Lungs clear bilaterally Cardio: Regular rate and rhythm GI: No hepatosplenomegaly, mild tenderness in the subxiphoid region, no mass Vascular: No leg edema    Lab Results:  Lab Results  Component Value Date   WBC 6.2 10/19/2021   HGB 9.7 (L) 10/19/2021   HCT 32.1 (L) 10/19/2021   MCV 76.8 (L) 10/19/2021   PLT 430 (H) 10/19/2021   NEUTROABS 3.8 10/19/2021    CMP  Lab Results  Component Value Date   NA 132 (L) 10/10/2021   K 3.8 10/10/2021   CL 103 10/10/2021   CO2 22 10/10/2021   GLUCOSE 144 (H) 10/10/2021   BUN 7 (L) 10/10/2021   CREATININE 1.17 10/10/2021   CALCIUM 9.0 10/10/2021   GFRNONAA >60 10/10/2021    Lab Results  Component Value Date   CEA 1.23 10/19/2021    Medications: I have reviewed the patient's current medications.   Assessment/Plan:  Gastric cancer CT abdomen/pelvis 10/08/2021-probable mass in the proximal stomach, enlarged gastropathic ligament node Upper endoscopy 10/01/2021-gastric cardia/fundus mass with oozing and stigmata of recent bleeding-biopsy at least intramucosal adenocarcinoma, mismatch repair protein expression intact, PD-L1 combined positive score 7%, HER2 positive PET 10/20/2021-hypermetabolic proximal gastric primary, or hypermetabolic gastropathic ligament node, right paramidline prostatic hypermetabolism, anal hypermetabolism without a CT mass Microcytic anemia secondary to #1 Diabetes Hypertension Coronary artery disease, cardiac  catheterization 08/11/2021-placement of proximal and distal right coronary artery stents Hypothyroidism Sleep apnea Anal hypermetabolism on the PET 10/20/2021-no mass identified   Disposition: Zachary Bowers is been diagnosed with locally advanced gastric cardia carcinoma.  We received communicate from Dr. Michail Sermon the tumor is located near the GE junction and should be considered a gastric cardia tumor.  His case was presented at the GI tumor conference yesterday.  The conference recommendation is to proceed with neoadjuvant chemotherapy/radiation to be followed by surgery.  He has seen Dr. Zenia Resides.  If the tumor were located more distally in the stomach I would recommend neoadjuvant chemotherapy alone.  I recommend weekly Taxol/carboplatin chemotherapy and concurrent radiation.  We reviewed potential toxicities associated with this regimen including the chance of nausea, alopecia, and hematologic toxicity.  We discussed the allergic reaction, bone pain, and neurotoxicity associated with Taxol.  He agrees to proceed.  He will attend a chemotherapy teaching class.  Zachary Bowers appears to have adequate for IV access to administer the 5 weeks of Taxol/carboplatin.  He is being scheduled for a radiation oncology appointment.  We will plan to begin Taxol/carboplatin during the week of 11/09/2021.  A chemotherapy plan was entered today.  Betsy Coder, MD  10/28/2021  11:20 AM

## 2021-10-28 NOTE — Progress Notes (Signed)
Compazine and Dexamethasone escribed in for chemo regimen

## 2021-10-28 NOTE — Progress Notes (Signed)
   Covid-19 Vaccination Clinic  Name:  Mirza Fessel    MRN: [id number] DOB: [birth year]  10/28/2021  Mr. Agosto was observed post Covid-19 immunization for 15 minutes without incident. He was provided with Vaccine Information Sheet and instruction to access the V-Safe system.   Mr. Eugene was instructed to call 911 with any severe reactions post vaccine: Difficulty breathing  Swelling of face and throat  A fast heartbeat  A bad rash all over body  Dizziness and weakness   Immunizations Administered     Name Date Dose VIS Date Route   Pfizer Covid-19 Vaccine Bivalent Booster 10/28/2021 12:06 PM 0.3 mL 08/12/2021 Intramuscular   Manufacturer: [place]   Lot: [id number]   [place]: [phone]

## 2021-10-28 NOTE — Telephone Encounter (Signed)
Called patient to schedule consultation with Dr. Moody. No answer, LVM for return call. 

## 2021-10-29 ENCOUNTER — Encounter: Payer: Self-pay | Admitting: Radiation Oncology

## 2021-10-29 ENCOUNTER — Other Ambulatory Visit: Payer: Self-pay

## 2021-10-29 ENCOUNTER — Ambulatory Visit
Admission: RE | Admit: 2021-10-29 | Discharge: 2021-10-29 | Disposition: A | Discharge status: Home / Self Care | Payer: Medicare PPO | Source: Ambulatory Visit | Attending: Radiation Oncology | Admitting: Radiation Oncology

## 2021-10-29 VITALS — BP 145/88 | HR 87 | Temp 96.8°F | Resp 18 | Ht 72.0 in | Wt 237.4 lb

## 2021-10-29 DIAGNOSIS — E079 Disorder of thyroid, unspecified: Secondary | ICD-10-CM | POA: Insufficient documentation

## 2021-10-29 DIAGNOSIS — C160 Malignant neoplasm of cardia: Secondary | ICD-10-CM | POA: Diagnosis not present

## 2021-10-29 DIAGNOSIS — Z79899 Other long term (current) drug therapy: Secondary | ICD-10-CM | POA: Diagnosis not present

## 2021-10-29 DIAGNOSIS — Z7984 Long term (current) use of oral hypoglycemic drugs: Secondary | ICD-10-CM | POA: Insufficient documentation

## 2021-10-29 DIAGNOSIS — I10 Essential (primary) hypertension: Secondary | ICD-10-CM | POA: Insufficient documentation

## 2021-10-29 DIAGNOSIS — E785 Hyperlipidemia, unspecified: Secondary | ICD-10-CM | POA: Insufficient documentation

## 2021-10-29 DIAGNOSIS — G4730 Sleep apnea, unspecified: Secondary | ICD-10-CM | POA: Diagnosis not present

## 2021-10-29 DIAGNOSIS — I2510 Atherosclerotic heart disease of native coronary artery without angina pectoris: Secondary | ICD-10-CM | POA: Insufficient documentation

## 2021-10-29 DIAGNOSIS — Z87891 Personal history of nicotine dependence: Secondary | ICD-10-CM | POA: Diagnosis not present

## 2021-10-29 DIAGNOSIS — N400 Enlarged prostate without lower urinary tract symptoms: Secondary | ICD-10-CM | POA: Diagnosis not present

## 2021-10-29 DIAGNOSIS — I700 Atherosclerosis of aorta: Secondary | ICD-10-CM | POA: Diagnosis not present

## 2021-10-29 DIAGNOSIS — Z7982 Long term (current) use of aspirin: Secondary | ICD-10-CM | POA: Diagnosis not present

## 2021-10-29 DIAGNOSIS — E119 Type 2 diabetes mellitus without complications: Secondary | ICD-10-CM | POA: Insufficient documentation

## 2021-10-29 DIAGNOSIS — Z800 Family history of malignant neoplasm of digestive organs: Secondary | ICD-10-CM | POA: Diagnosis not present

## 2021-10-29 HISTORY — DX: Malignant neoplasm of stomach, unspecified: C16.9

## 2021-10-29 NOTE — Progress Notes (Signed)
Radiation Oncology         [id number]) [phone] ________________________________  Name: Zachary Bowers        MRN: [id number]  Date of Service: 10/29/2021 DOB: [birth year]  VZ:DGLOVF, [name], MD  Ladell Pier, MD     REFERRING PHYSICIAN: Ladell Pier, MD   DIAGNOSIS: The encounter diagnosis was Malignant neoplasm of cardia of stomach ([place]).   HISTORY OF PRESENT ILLNESS: Zachary Bowers is a 65 y.o. male seen at the request of Dr. Benay Spice for a new diagnosis of adenocarcinoma involving the gastric cardia at the level just below the GE junction.  He developed shortness of breath and changes in his stools including melena, he was seen on 10/05/2021 in the emergency department, and chest x-ray was negative for cardiopulmonary disease.  He had a CT abdomen pelvis that revealed a mass in the gastric cardia.  He went on to undergo endoscopy that showed a lesion in the gastric cardia but remainder of the distal stomach was normal in appearance.  Biopsies were obtained during this procedure on 10/09/2021 and were consistent with adenocarcinoma.  His tumor is HER2 amplified.  He did meet with Dr. Benay Spice and CEA was not elevated when it was drawn.  A PSA has been ordered as well due to PET scan findings of hypermetabolic change within the right central prostate.  He has a 2.3 cm gastrohepatic lymph node that is hypermetabolic as well as the known lesion in the stomach.  Given these findings his case was discussed in multidisciplinary gastrointestinal oncology he appears to be a good candidate for chemoradiation.  He is seen today to discuss this treatment.  Dr. Benay Spice has plans to begin his therapy on 11/09/2021.    PREVIOUS RADIATION THERAPY: No   PAST MEDICAL HISTORY:  Past Medical History:  Diagnosis Date   Coronary artery disease    Diabetes mellitus without complication ([place])    Hyperlipidemia    Hypertension    Hypogonadism in male    OSA (obstructive sleep apnea)    Stomach cancer  ([place])    Thyroid disease        PAST SURGICAL HISTORY: Past Surgical History:  Procedure Laterality Date   APPENDECTOMY     APPENDECTOMY Right    BIOPSY  10/09/2021   Procedure: BIOPSY;  Surgeon: Wilford Corner, MD;  Location: [facility];  Service: Endoscopy;;   CARDIAC CATHETERIZATION     CORONARY STENT INTERVENTION N/A 08/11/2021   Procedure: CORONARY STENT INTERVENTION;  Surgeon: Jettie Booze, MD;  Location: [facility];  Service: Cardiovascular;  Laterality: N/A;   ESOPHAGOGASTRODUODENOSCOPY (EGD) WITH PROPOFOL N/A 10/09/2021   Procedure: ESOPHAGOGASTRODUODENOSCOPY (EGD) WITH PROPOFOL;  Surgeon: Wilford Corner, MD;  Location: [facility];  Service: Endoscopy;  Laterality: N/A;   INTRAVASCULAR ULTRASOUND/IVUS N/A 08/11/2021   Procedure: Intravascular Ultrasound/IVUS;  Surgeon: Jettie Booze, MD;  Location: [facility];  Service: Cardiovascular;  Laterality: N/A;   LEFT HEART CATH AND CORONARY ANGIOGRAPHY N/A 08/11/2021   Procedure: LEFT HEART CATH AND CORONARY ANGIOGRAPHY;  Surgeon: Jettie Booze, MD;  Location: [facility];  Service: Cardiovascular;  Laterality: N/A;     FAMILY HISTORY:  Family History  Problem Relation Age of Onset   Heart disease Mother    Alzheimer's disease Father    Stomach cancer Maternal Uncle      SOCIAL HISTORY:  reports that he has quit smoking. He has quit using smokeless tobacco.  His smokeless tobacco use included chew. He  reports that he does not drink alcohol and does not use drugs.  The patient is married and lives in [place].  He is retired but enjoys keeping horses on his property.  Between his and his family members who have horses on his property there are 7 to keep up with.   ALLERGIES: Niaspan [niacin er]   MEDICATIONS:  Current Outpatient Medications  Medication Sig Dispense Refill   amLODipine (NORVASC) 10 MG tablet Take 10 mg by mouth daily.     aspirin 81 MG tablet Take 81  mg by mouth daily.     atorvastatin (LIPITOR) 80 MG tablet Take 1 tablet (80 mg total) by mouth daily. 90 tablet 0   COVID-19 mRNA bivalent vaccine, Pfizer, (PFIZER COVID-19 VAC BIVALENT) injection Inject into the muscle. 0.3 mL 0   dexamethasone (DECADRON) 2 MG tablet Take 5 tablets (10 mg total) by mouth as directed. Take 5 tablets at 10 pm night before first treatment Take 5 tablets at 6 am morning of first treatment 10 tablet 0   ferrous sulfate 325 (65 FE) MG tablet Take 325 mg by mouth 2 (two) times daily.     glimepiride (AMARYL) 4 MG tablet Take 4 mg by mouth every morning.     glucose blood test strip 1 each by Other route. Check blood sugar every morning     isosorbide mononitrate (IMDUR) 30 MG 24 hr tablet Take 1 tablet (30 mg total) by mouth daily. 30 tablet 0   levothyroxine (SYNTHROID, LEVOTHROID) 125 MCG tablet Take 125 mcg by mouth daily before breakfast.   0   metFORMIN (GLUCOPHAGE) 500 MG tablet Take 500-1,000 mg by mouth See admin instructions. 1000 mg in the morning 500 mg with dinner as needed if sugar is low     omeprazole (PRILOSEC) 40 MG capsule Take 1 capsule (40 mg total) by mouth 2 (two) times daily. 60 capsule 0   sucralfate (CARAFATE) 1 GM/10ML suspension 10 mL on an empty stomach     telmisartan (MICARDIS) 40 MG tablet Take 40 mg by mouth daily.     nitroGLYCERIN (NITROSTAT) 0.4 MG SL tablet Place 1 tablet (0.4 mg total) under the tongue every 5 (five) minutes as needed for chest pain. (Patient not taking: Reported on 10/29/2021) 25 tablet 5   sucralfate (CARAFATE) 1 GM/10ML suspension Take 10 mLs (1 g total) by mouth 4 (four) times daily for 15 days. 600 mL 0   No current facility-administered medications for this encounter.     REVIEW OF SYSTEMS: On review of systems, the patient reports that he is doing well overall.  He states that he has been able to eat most anything he likes with the exception from time to time of being able to tolerate coffee, and the  texture of meats like pork chops or beef.  Otherwise his weight has been maintained.  He denies any nausea or vomiting.  He has been fatigued however in the last few weeks and was unable to do some of his routine activities around his [place].  No other complaints are verbalized.  PHYSICAL EXAM:  Wt Readings from Last 3 Encounters:  10/29/21 237 lb 6 oz (107.7 kg)  10/28/21 237 lb 6.4 oz (107.7 kg)  10/19/21 233 lb 9.6 oz (106 kg)   Temp Readings from Last 3 Encounters:  10/29/21 (!) 96.8 F (36 C) (Temporal)  10/28/21 97.7 F (36.5 C) (Oral)  10/19/21 98 F (36.7 C) (Oral)   BP Readings from Last 3  Encounters:  10/29/21 (!) 145/88  10/28/21 (!) 142/85  10/19/21 139/86   Pulse Readings from Last 3 Encounters:  10/29/21 87  10/28/21 81  10/19/21 83   Pain Assessment Pain Score: 0-No pain/10  In general this is a well appearing African-American male in no acute distress. He's alert and oriented x4 and appropriate throughout the examination. Cardiopulmonary assessment is negative for acute distress and he exhibits normal effort.    ECOG = 1  0 - Asymptomatic (Fully active, able to carry on all predisease activities without restriction)  1 - Symptomatic but completely ambulatory (Restricted in physically strenuous activity but ambulatory and able to carry out work of a light or sedentary nature. For example, light housework, office work)  2 - Symptomatic, <50% in bed during the day (Ambulatory and capable of all self care but unable to carry out any work activities. Up and about more than 50% of waking hours)  3 - Symptomatic, >50% in bed, but not bedbound (Capable of only limited self-care, confined to bed or chair 50% or more of waking hours)  4 - Bedbound (Completely disabled. Cannot carry on any self-care. Totally confined to bed or chair)  5 - Death   Eustace Pen MM, Creech RH, Tormey DC, et al. [id number]). "Toxicity and response criteria of the [facility]".  [place] Oncol. 5 (6): 649-55    LABORATORY DATA:  Lab Results  Component Value Date   WBC 6.2 10/19/2021   HGB 9.7 (L) 10/19/2021   HCT 32.1 (L) 10/19/2021   MCV 76.8 (L) 10/19/2021   PLT 430 (H) 10/19/2021   Lab Results  Component Value Date   NA 132 (L) 10/10/2021   K 3.8 10/10/2021   CL 103 10/10/2021   CO2 22 10/10/2021   No results found for: ALT, AST, GGT, ALKPHOS, BILITOT    RADIOGRAPHY: DG Chest 2 View  Result Date: 10/06/2021 CLINICAL DATA:  64 year old male with history of acute onset of cough with wheezing for 1 week. EXAM: CHEST - 2 VIEW COMPARISON:  Chest x-ray 07/27/2021. FINDINGS: Lung volumes are normal. No consolidative airspace disease. No pleural effusions. No pneumothorax. No pulmonary nodule or mass noted. Pulmonary vasculature and the cardiomediastinal silhouette are within normal limits. IMPRESSION: No radiographic evidence of acute cardiopulmonary disease. Electronically Signed   By: Vinnie Langton M.D.   On: 10/06/2021 11:08   CT ABDOMEN PELVIS W CONTRAST  Result Date: 10/08/2021 CLINICAL DATA:  66 year old male with history of anemia presenting with right lower quadrant abdominal pain. EXAM: CT ABDOMEN AND PELVIS WITH CONTRAST TECHNIQUE: Multidetector CT imaging of the abdomen and pelvis was performed using the standard protocol following bolus administration of intravenous contrast. CONTRAST:  74m OMNIPAQUE IOHEXOL 300 MG/ML  SOLN COMPARISON:  No priors. FINDINGS: Lower chest: Mild scarring in the lung bases bilaterally. Atherosclerotic calcifications in the left anterior descending, left circumflex and right coronary arteries. Hepatobiliary: No suspicious cystic or solid hepatic lesions. No intra or extrahepatic biliary ductal dilatation. Gallbladder is normal in appearance. Pancreas: No pancreatic mass. No pancreatic ductal dilatation. No pancreatic or peripancreatic fluid collections or inflammatory changes. Spleen: Unremarkable. Adrenals/Urinary  Tract: Bilateral kidneys and adrenal glands are normal in appearance. No hydroureteronephrosis. Urinary bladder is normal in appearance. Stomach/Bowel: Potential soft tissue mass in the proximal stomach (axial image 20 of series 3 and coronal image 53 of series 6) estimated to measure approximately 4.8 x 3.4 x 2.9 cm. Distal stomach is otherwise normal in appearance. No pathologic dilatation  of small bowel or colon. The appendix is not confidently identified and may be surgically absent. Regardless, there are no inflammatory changes noted adjacent to the cecum to suggest the presence of an acute appendicitis at this time. Vascular/Lymphatic: Aortic atherosclerosis, without evidence of aneurysm or dissection in the abdominal or pelvic vasculature. In the upper abdomen in the region of the gastrohepatic ligament (axial image 25 of series 3 and coronal image 46 of series 6) there is a soft tissue attenuation mass measuring 2.1 x 2.7 x 2.3 cm, likely an enlarged gastrohepatic ligament lymph node. No other definite lymphadenopathy noted elsewhere in the abdomen or pelvis. Reproductive: Prostate gland and seminal vesicles are unremarkable in appearance. Other: No significant volume of ascites.  No pneumoperitoneum. Musculoskeletal: There are no aggressive appearing lytic or blastic lesions noted in the visualized portions of the skeleton. IMPRESSION: 1. No acute findings are noted to account for the patient's right lower quadrant abdominal pain. However, there is a probable mass in the proximal stomach just distal to the gastroesophageal junction. Adjacent to this in the gastrohepatic ligament there is an enlarged lymph node. Findings are concerning for possible primary gastric neoplasm with local nodal metastasis. Further evaluation with endoscopy is strongly recommended in the near future to better evaluate this finding. 2. Aortic atherosclerosis, in addition to least 3 vessel coronary artery disease. Please note that  although the presence of coronary artery calcium documents the presence of coronary artery disease, the severity of this disease and any potential stenosis cannot be assessed on this non-gated CT examination. Assessment for potential risk factor modification, dietary therapy or pharmacologic therapy may be warranted, if clinically indicated. Electronically Signed   By: Vinnie Langton M.D.   On: 10/08/2021 05:42   NM PET Image Initial (PI) Skull Base To Thigh (F-18 FDG)  Result Date: 10/21/2021 CLINICAL DATA:  Initial treatment strategy for staging of gastric cancer. EXAM: NUCLEAR MEDICINE PET SKULL BASE TO THIGH TECHNIQUE: 11.6 mCi F-18 FDG was injected intravenously. Full-ring PET imaging was performed from the skull base to thigh after the radiotracer. CT data was obtained and used for attenuation correction and anatomic localization. Fasting blood glucose: 132 mg/dl COMPARISON:  Abdominopelvic CT of 10/08/2021. Endoscopy report of 10/09/2021. FINDINGS: Mediastinal blood pool activity: SUV max 2.4 Liver activity: SUV max NA NECK: No cervical nodal hypermetabolism. Diffuse thyroid hypermetabolism, including at a S.U.V. max of 7.4. Incidental CT findings: No cervical adenopathy. Mucosal thickening right maxillary sinus. CHEST: No pulmonary parenchymal or thoracic nodal hypermetabolism. Incidental CT findings: Three vessel coronary artery calcification. Mild cardiomegaly. 4 mm left lower lobe pulmonary nodule on 45/8, below PET resolution. ABDOMEN/PELVIS: The proximal gastric primary is hypermetabolic. Relatively CT occult secondary to underdistention, but measures a S.U.V. max of 11.8 on 108/4. Hypermetabolic gastrohepatic ligament node, including at 2.0 by 2.8 cm and a S.U.V. max of 6.6 on 117/4. Right paramidline central prostatic hypermetabolism at a S.U.V. max of 8.6, approximally at the midgland level on 199/4. Relatively focal anal hypermetabolism without well-defined CT mass. Example at a S.U.V. max of  8.9. Incidental CT findings: Deferred to recent diagnostic CT. Normal adrenal glands. Aortic atherosclerosis. Mild prostatomegaly. Small fat containing left inguinal hernia. SKELETON: No abnormal marrow activity. Incidental CT findings: none IMPRESSION: 1. Proximal gastric primary with gastrohepatic ligament nodal metastasis. 2. 4 mm left lower lobe pulmonary nodule.  Below PET resolution. 3. Focal anal hypermetabolism, without well-defined CT mass. Correlate with colon cancer screening history and possibly physical exam. 4. Nonspecific hypermetabolism within  the right paramidline central prostate. Consider correlation with PSA level. 5. Diffuse thyroid hypermetabolism can be seen with thyroiditis. Consider laboratory correlation. 6. Incidental findings, including: Coronary artery atherosclerosis. Aortic Atherosclerosis (ICD10-I70.0). Sinus disease. Electronically Signed   By: Abigail Miyamoto M.D.   On: 10/21/2021 09:09       IMPRESSION/PLAN: 1. Stage III, cT3N1M0, adenocarcinoma of the gastric cardia. Dr. Lisbeth Renshaw discusses the pathology findings and reviews the nature of gastric carcinoma. It appears his case is locally advanced but not metastatic. He would be a good candidate for chemoRT.  We discussed the risks, benefits, short, and long term effects of radiotherapy, as well as the curative intent, and the patient is interested in proceeding. Dr. Lisbeth Renshaw discusses the delivery and logistics of radiotherapy and anticipates a course of 5 1/2 weeks of radiotherapy to the stomach just at and below the level of the GE jucntion. Written consent is obtained and placed in the chart, a copy was provided to the patient. He will simulate next Tuesday and we plan to start his treatment the week of 11/09/21.  In a visit lasting 60 minutes, greater than 50% of the time was spent face to face discussing the patient's condition, in preparation for the discussion, and coordinating the patient's care.   The above documentation  reflects my direct findings during this shared patient visit. Please see the separate note by Dr. Lisbeth Renshaw on this date for the remainder of the patient's plan of care.    Carola Rhine, [facility]   **Disclaimer: This note was dictated with voice recognition software. Similar sounding words can inadvertently be transcribed and this note may contain transcription errors which may not have been corrected upon publication of note.**

## 2021-10-30 ENCOUNTER — Inpatient Hospital Stay: Payer: Medicare PPO

## 2021-10-30 DIAGNOSIS — Z129 Encounter for screening for malignant neoplasm, site unspecified: Secondary | ICD-10-CM

## 2021-10-30 DIAGNOSIS — C160 Malignant neoplasm of cardia: Secondary | ICD-10-CM

## 2021-10-30 DIAGNOSIS — D630 Anemia in neoplastic disease: Secondary | ICD-10-CM | POA: Diagnosis not present

## 2021-10-30 DIAGNOSIS — I10 Essential (primary) hypertension: Secondary | ICD-10-CM | POA: Diagnosis not present

## 2021-10-30 DIAGNOSIS — I2510 Atherosclerotic heart disease of native coronary artery without angina pectoris: Secondary | ICD-10-CM | POA: Diagnosis not present

## 2021-10-30 DIAGNOSIS — R935 Abnormal findings on diagnostic imaging of other abdominal regions, including retroperitoneum: Secondary | ICD-10-CM | POA: Diagnosis not present

## 2021-10-30 DIAGNOSIS — E039 Hypothyroidism, unspecified: Secondary | ICD-10-CM | POA: Diagnosis not present

## 2021-10-30 DIAGNOSIS — G4730 Sleep apnea, unspecified: Secondary | ICD-10-CM | POA: Diagnosis not present

## 2021-10-30 DIAGNOSIS — E119 Type 2 diabetes mellitus without complications: Secondary | ICD-10-CM | POA: Diagnosis not present

## 2021-10-30 LAB — CBC WITH DIFFERENTIAL (CANCER CENTER ONLY)
Abs Immature Granulocytes: 0.03 10*3/uL (ref 0.00–0.07)
Basophils Absolute: 0.1 10*3/uL (ref 0.0–0.1)
Basophils Relative: 1 %
Eosinophils Absolute: 0.5 10*3/uL (ref 0.0–0.5)
Eosinophils Relative: 7 %
HCT: 36.5 % — ABNORMAL LOW (ref 39.0–52.0)
Hemoglobin: 11.1 g/dL — ABNORMAL LOW (ref 13.0–17.0)
Immature Granulocytes: 1 %
Lymphocytes Relative: 25 %
Lymphs Abs: 1.5 10*3/uL (ref 0.7–4.0)
MCH: 23.8 pg — ABNORMAL LOW (ref 26.0–34.0)
MCHC: 30.4 g/dL (ref 30.0–36.0)
MCV: 78.2 fL — ABNORMAL LOW (ref 80.0–100.0)
Monocytes Absolute: 0.8 10*3/uL (ref 0.1–1.0)
Monocytes Relative: 13 %
Neutro Abs: 3.3 10*3/uL (ref 1.7–7.7)
Neutrophils Relative %: 53 %
Platelet Count: 394 10*3/uL (ref 150–400)
RBC: 4.67 MIL/uL (ref 4.22–5.81)
RDW: 20.5 % — ABNORMAL HIGH (ref 11.5–15.5)
WBC Count: 6.1 10*3/uL (ref 4.0–10.5)
nRBC: 0 % (ref 0.0–0.2)

## 2021-10-30 LAB — CEA (ACCESS): CEA (CHCC): 1.5 ng/mL (ref 0.00–5.00)

## 2021-10-30 LAB — CMP (CANCER CENTER ONLY)
ALT: 14 U/L (ref 0–44)
AST: 12 U/L — ABNORMAL LOW (ref 15–41)
Albumin: 4.8 g/dL (ref 3.5–5.0)
Alkaline Phosphatase: 85 U/L (ref 38–126)
Anion gap: 10 (ref 5–15)
BUN: 16 mg/dL (ref 8–23)
CO2: 25 mmol/L (ref 22–32)
Calcium: 10.2 mg/dL (ref 8.9–10.3)
Chloride: 104 mmol/L (ref 98–111)
Creatinine: 1.19 mg/dL (ref 0.61–1.24)
GFR, Estimated: >60 mL/min (ref >60)
Glucose, Bld: 154 mg/dL — ABNORMAL HIGH (ref 70–99)
Potassium: 4.2 mmol/L (ref 3.5–5.1)
Sodium: 139 mmol/L (ref 135–145)
Total Bilirubin: 0.5 mg/dL (ref 0.3–1.2)
Total Protein: 8.5 g/dL — ABNORMAL HIGH (ref 6.5–8.1)

## 2021-10-30 MED ORDER — PROCHLORPERAZINE MALEATE 10 MG PO TABS: 10.0000 mg | ORAL_TABLET | Freq: Four times a day (QID) | ORAL | 1 refills | Status: DC | PRN

## 2021-10-31 LAB — PROSTATE-SPECIFIC AG, SERUM (LABCORP): Prostate Specific Ag, Serum: 2.7 ng/mL (ref 0.0–4.0)

## 2021-11-02 ENCOUNTER — Telehealth (HOSPITAL_COMMUNITY): Payer: Self-pay

## 2021-11-02 NOTE — Telephone Encounter (Signed)
No response from pt in regards to Cardiac Rehab. Closed referral               

## 2021-11-03 ENCOUNTER — Other Ambulatory Visit: Payer: Self-pay

## 2021-11-03 ENCOUNTER — Ambulatory Visit
Admission: RE | Admit: 2021-11-03 | Discharge: 2021-11-03 | Disposition: A | Discharge status: Home / Self Care | Payer: Medicare PPO | Source: Ambulatory Visit | Attending: Radiation Oncology | Admitting: Radiation Oncology

## 2021-11-03 DIAGNOSIS — C160 Malignant neoplasm of cardia: Secondary | ICD-10-CM | POA: Insufficient documentation

## 2021-11-03 DIAGNOSIS — Z510 Encounter for antineoplastic radiation therapy: Secondary | ICD-10-CM | POA: Diagnosis not present

## 2021-11-08 ENCOUNTER — Other Ambulatory Visit: Payer: Self-pay | Admitting: Oncology

## 2021-11-09 ENCOUNTER — Other Ambulatory Visit: Payer: Self-pay | Admitting: *Deleted

## 2021-11-09 ENCOUNTER — Other Ambulatory Visit: Payer: Self-pay

## 2021-11-09 DIAGNOSIS — C160 Malignant neoplasm of cardia: Secondary | ICD-10-CM

## 2021-11-09 MED ORDER — DEXAMETHASONE 2 MG PO TABS: 10.0000 mg | ORAL_TABLET | Freq: Two times a day (BID) | ORAL | 0 refills | Status: DC

## 2021-11-09 MED ORDER — DEXAMETHASONE 2 MG PO TABS: 10.0000 mg | ORAL_TABLET | ORAL | 0 refills | Status: DC

## 2021-11-09 NOTE — Addendum Note (Signed)
Addended by: Lin Givens on: 11/09/2021 09:00 AM   Modules accepted: Orders

## 2021-11-10 DIAGNOSIS — C160 Malignant neoplasm of cardia: Secondary | ICD-10-CM | POA: Diagnosis not present

## 2021-11-10 DIAGNOSIS — Z510 Encounter for antineoplastic radiation therapy: Secondary | ICD-10-CM | POA: Diagnosis not present

## 2021-11-11 ENCOUNTER — Other Ambulatory Visit: Payer: Self-pay

## 2021-11-11 ENCOUNTER — Ambulatory Visit
Admission: RE | Admit: 2021-11-11 | Discharge: 2021-11-11 | Disposition: A | Discharge status: Home / Self Care | Payer: Medicare PPO | Source: Ambulatory Visit | Attending: Radiation Oncology | Admitting: Radiation Oncology

## 2021-11-11 DIAGNOSIS — C160 Malignant neoplasm of cardia: Secondary | ICD-10-CM | POA: Diagnosis not present

## 2021-11-11 DIAGNOSIS — Z510 Encounter for antineoplastic radiation therapy: Secondary | ICD-10-CM | POA: Diagnosis not present

## 2021-11-11 NOTE — Progress Notes (Signed)
Pharmacist Chemotherapy Monitoring - Initial Assessment    Anticipated start date: 11/12/21   The following has been reviewed per standard work regarding the patient's treatment regimen: The patient's diagnosis, treatment plan and drug doses, and organ/hematologic function Lab orders and baseline tests specific to treatment regimen  The treatment plan start date, drug sequencing, and pre-medications Prior authorization status  Patient's documented medication list, including drug-drug interaction screen and prescriptions for anti-emetics and supportive care specific to the treatment regimen The drug concentrations, fluid compatibility, administration routes, and timing of the medications to be used The patient's access for treatment and lifetime cumulative dose history, if applicable  The patient's medication allergies and previous infusion related reactions, if applicable   Changes made to treatment plan:  N/A  Follow up needed:  N/A   Patrica Duel, [facility], 11/11/2021  3:44 PM

## 2021-11-12 ENCOUNTER — Encounter: Payer: Self-pay | Admitting: Nurse Practitioner

## 2021-11-12 ENCOUNTER — Inpatient Hospital Stay: Payer: Medicare PPO

## 2021-11-12 ENCOUNTER — Encounter: Payer: Self-pay | Admitting: *Deleted

## 2021-11-12 ENCOUNTER — Other Ambulatory Visit: Payer: Self-pay

## 2021-11-12 ENCOUNTER — Ambulatory Visit
Admission: RE | Admit: 2021-11-12 | Discharge: 2021-11-12 | Disposition: A | Discharge status: Home / Self Care | Payer: Medicare PPO | Source: Ambulatory Visit | Attending: Radiation Oncology | Admitting: Radiation Oncology

## 2021-11-12 ENCOUNTER — Inpatient Hospital Stay: Payer: Medicare PPO | Admitting: Nurse Practitioner

## 2021-11-12 VITALS — BP 136/80 | HR 94 | Temp 97.9°F | Resp 20

## 2021-11-12 VITALS — BP 150/88 | HR 89 | Temp 97.8°F | Resp 20 | Ht 72.0 in | Wt 237.6 lb

## 2021-11-12 DIAGNOSIS — Z510 Encounter for antineoplastic radiation therapy: Secondary | ICD-10-CM | POA: Diagnosis not present

## 2021-11-12 DIAGNOSIS — E119 Type 2 diabetes mellitus without complications: Secondary | ICD-10-CM | POA: Insufficient documentation

## 2021-11-12 DIAGNOSIS — I10 Essential (primary) hypertension: Secondary | ICD-10-CM | POA: Insufficient documentation

## 2021-11-12 DIAGNOSIS — Z923 Personal history of irradiation: Secondary | ICD-10-CM | POA: Insufficient documentation

## 2021-11-12 DIAGNOSIS — E039 Hypothyroidism, unspecified: Secondary | ICD-10-CM | POA: Diagnosis not present

## 2021-11-12 DIAGNOSIS — C160 Malignant neoplasm of cardia: Secondary | ICD-10-CM

## 2021-11-12 DIAGNOSIS — D500 Iron deficiency anemia secondary to blood loss (chronic): Secondary | ICD-10-CM | POA: Diagnosis not present

## 2021-11-12 DIAGNOSIS — Z5111 Encounter for antineoplastic chemotherapy: Secondary | ICD-10-CM | POA: Diagnosis not present

## 2021-11-12 DIAGNOSIS — Z293 Encounter for prophylactic fluoride administration: Secondary | ICD-10-CM | POA: Insufficient documentation

## 2021-11-12 DIAGNOSIS — D509 Iron deficiency anemia, unspecified: Secondary | ICD-10-CM | POA: Insufficient documentation

## 2021-11-12 DIAGNOSIS — G4730 Sleep apnea, unspecified: Secondary | ICD-10-CM | POA: Insufficient documentation

## 2021-11-12 DIAGNOSIS — C169 Malignant neoplasm of stomach, unspecified: Secondary | ICD-10-CM | POA: Insufficient documentation

## 2021-11-12 DIAGNOSIS — I2510 Atherosclerotic heart disease of native coronary artery without angina pectoris: Secondary | ICD-10-CM | POA: Insufficient documentation

## 2021-11-12 LAB — CBC WITH DIFFERENTIAL (CANCER CENTER ONLY)
Abs Immature Granulocytes: 0.02 10*3/uL (ref 0.00–0.07)
Basophils Absolute: 0 10*3/uL (ref 0.0–0.1)
Basophils Relative: 0 %
Eosinophils Absolute: 0 10*3/uL (ref 0.0–0.5)
Eosinophils Relative: 0 %
HCT: 36.6 % — ABNORMAL LOW (ref 39.0–52.0)
Hemoglobin: 10.9 g/dL — ABNORMAL LOW (ref 13.0–17.0)
Immature Granulocytes: 0 %
Lymphocytes Relative: 8 %
Lymphs Abs: 0.5 10*3/uL — ABNORMAL LOW (ref 0.7–4.0)
MCH: 23.1 pg — ABNORMAL LOW (ref 26.0–34.0)
MCHC: 29.8 g/dL — ABNORMAL LOW (ref 30.0–36.0)
MCV: 77.5 fL — ABNORMAL LOW (ref 80.0–100.0)
Monocytes Absolute: 0.1 10*3/uL (ref 0.1–1.0)
Monocytes Relative: 1 %
Neutro Abs: 5.1 10*3/uL (ref 1.7–7.7)
Neutrophils Relative %: 91 %
Platelet Count: 313 10*3/uL (ref 150–400)
RBC: 4.72 MIL/uL (ref 4.22–5.81)
RDW: 20 % — ABNORMAL HIGH (ref 11.5–15.5)
WBC Count: 5.6 10*3/uL (ref 4.0–10.5)
nRBC: 0 % (ref 0.0–0.2)

## 2021-11-12 LAB — CMP (CANCER CENTER ONLY)
ALT: 13 U/L (ref 0–44)
AST: 11 U/L — ABNORMAL LOW (ref 15–41)
Albumin: 4.9 g/dL (ref 3.5–5.0)
Alkaline Phosphatase: 88 U/L (ref 38–126)
Anion gap: 9 (ref 5–15)
BUN: 17 mg/dL (ref 8–23)
CO2: 21 mmol/L — ABNORMAL LOW (ref 22–32)
Calcium: 10.6 mg/dL — ABNORMAL HIGH (ref 8.9–10.3)
Chloride: 104 mmol/L (ref 98–111)
Creatinine: 1.16 mg/dL (ref 0.61–1.24)
GFR, Estimated: >60 mL/min (ref >60)
Glucose, Bld: 326 mg/dL — ABNORMAL HIGH (ref 70–99)
Potassium: 4.3 mmol/L (ref 3.5–5.1)
Sodium: 134 mmol/L — ABNORMAL LOW (ref 135–145)
Total Bilirubin: 0.5 mg/dL (ref 0.3–1.2)
Total Protein: 8 g/dL (ref 6.5–8.1)

## 2021-11-12 MED ORDER — FAMOTIDINE 20 MG IN NS 100 ML IVPB
20.0000 mg | Freq: Once | INTRAVENOUS | Status: AC
  Administered 2021-11-12: 20 mg via INTRAVENOUS
  Filled 2021-11-12: qty 100

## 2021-11-12 MED ORDER — SODIUM CHLORIDE 0.9 % IV SOLN
50.0000 mg/m2 | Freq: Once | INTRAVENOUS | Status: AC
  Administered 2021-11-12: 120 mg via INTRAVENOUS
  Filled 2021-11-12: qty 20

## 2021-11-12 MED ORDER — SODIUM CHLORIDE 0.9 % IV SOLN
10.0000 mg | Freq: Once | INTRAVENOUS | Status: AC
  Administered 2021-11-12: 10 mg via INTRAVENOUS
  Filled 2021-11-12: qty 1

## 2021-11-12 MED ORDER — SODIUM CHLORIDE 0.9 % IV SOLN
240.8000 mg | Freq: Once | INTRAVENOUS | Status: AC
  Administered 2021-11-12: 240 mg via INTRAVENOUS
  Filled 2021-11-12: qty 24

## 2021-11-12 MED ORDER — SODIUM CHLORIDE 0.9 % IV SOLN: Freq: Once | INTRAVENOUS | Status: AC

## 2021-11-12 MED ORDER — DIPHENHYDRAMINE HCL 50 MG/ML IJ SOLN
50.0000 mg | Freq: Once | INTRAMUSCULAR | Status: AC
  Administered 2021-11-12: 50 mg via INTRAVENOUS
  Filled 2021-11-12: qty 1

## 2021-11-12 MED ORDER — PALONOSETRON HCL INJECTION 0.25 MG/5ML
0.2500 mg | Freq: Once | INTRAVENOUS | Status: AC
  Administered 2021-11-12: 0.25 mg via INTRAVENOUS
  Filled 2021-11-12: qty 5

## 2021-11-12 NOTE — Progress Notes (Signed)
Patient presents for treatment. RN assessment completed along with the following:  Labs/vitals reviewed - Yes, and within treatment parameters.   Weight within 10% of previous measurement - Yes Oncology Treatment Attestation completed for current therapy- Yes, on date 10/28/21 Informed consent completed and reflects current therapy/intent - Yes, on date 11/12/21             Provider progress note reviewed - Yes, today's provider note was reviewed. Treatment/Antibody/Supportive plan reviewed - Yes, and there are no adjustments needed for today's treatment. S&H and other orders reviewed - Yes, and there are no additional orders identified. Previous treatment date reviewed - Yes, and the appropriate amount of time has elapsed between treatments. Clinic Hand Off Received from - Leander Rams, NP  Patient to proceed with treatment.

## 2021-11-12 NOTE — Progress Notes (Signed)
Met with Zachary Bowers as he begin his first chemo treatment with concurrent chemo/rad. He started radiation yesterday and tolerated well. Will continue to monitor.

## 2021-11-12 NOTE — Patient Instructions (Signed)
Zachary Bowers   Discharge Instructions: Thank you for choosing [facility] to provide your oncology and hematology care.   If you have a lab appointment with the [facility], please go directly to the [facility] and check in at the registration area.   Wear comfortable clothing and clothing appropriate for easy access to any Portacath or PICC line.   We strive to give you quality time with your provider. You may need to reschedule your appointment if you arrive late (15 or more minutes).  Arriving late affects you and other patients whose appointments are after yours.  Also, if you miss three or more appointments without notifying the office, you may be dismissed from the clinic at the provider's discretion.      For prescription refill requests, have your pharmacy contact our office and allow 72 hours for refills to be completed.    Today you received the following chemotherapy and/or immunotherapy agents Taxol, Carboplatin      To help prevent nausea and vomiting after your treatment, we encourage you to take your nausea medication as directed.  BELOW ARE SYMPTOMS THAT SHOULD BE REPORTED IMMEDIATELY: *FEVER GREATER THAN 100.4 F (38 C) OR HIGHER *CHILLS OR SWEATING *NAUSEA AND VOMITING THAT IS NOT CONTROLLED WITH YOUR NAUSEA MEDICATION *UNUSUAL SHORTNESS OF BREATH *UNUSUAL BRUISING OR BLEEDING *URINARY PROBLEMS (pain or burning when urinating, or frequent urination) *BOWEL PROBLEMS (unusual diarrhea, constipation, pain near the anus) TENDERNESS IN MOUTH AND THROAT WITH OR WITHOUT PRESENCE OF ULCERS (sore throat, sores in mouth, or a toothache) UNUSUAL RASH, SWELLING OR PAIN  UNUSUAL VAGINAL DISCHARGE OR ITCHING   Items with * indicate a potential emergency and should be followed up as soon as possible or go to the Emergency Department if any problems should occur.  Please show the CHEMOTHERAPY ALERT CARD or IMMUNOTHERAPY ALERT CARD at  check-in to the Emergency Department and triage nurse.  Should you have questions after your visit or need to cancel or reschedule your appointment, please contact [facility]  Dept: [phone]  and follow the prompts.  Office hours are 8:00 a.m. to 4:30 p.m. Monday - Friday. Please note that voicemails left after 4:00 p.m. may not be returned until the following business day.  We are closed weekends and major holidays. You have access to a nurse at all times for urgent questions. Please call the main number to the clinic Dept: [phone] and follow the prompts.   For any non-urgent questions, you may also contact your provider using MyChart. We now offer e-Visits for anyone 104 and older to request care online for non-urgent symptoms. For details visit [email or web address].   Also download the MyChart app! Go to the app store, search "MyChart", open the app, select [facility], and log in with your MyChart username and password.  Due to Covid, a mask is required upon entering the hospital/clinic. If you do not have a mask, one will be given to you upon arrival. For doctor visits, patients may have 1 support person aged 65 or older with them. For treatment visits, patients cannot have anyone with them due to current Covid guidelines and our immunocompromised population.   Paclitaxel injection What is this medication? PACLITAXEL (PAK li TAX el) is a chemotherapy drug. It targets fast dividing cells, like cancer cells, and causes these cells to die. This medicine is used to treat ovarian cancer, breast cancer, lung cancer, Kaposi's sarcoma, and other cancers. This  medicine may be used for other purposes; ask your health care provider or pharmacist if you have questions. COMMON BRAND NAME(S): Onxol, Taxol What should I tell my care team before I take this medication? They need to know if you have any of these conditions: history of irregular heartbeat liver  disease low blood counts, like low white cell, platelet, or red cell counts lung or breathing disease, like asthma tingling of the fingers or toes, or other nerve disorder an unusual or allergic reaction to paclitaxel, alcohol, polyoxyethylated castor oil, other chemotherapy, other medicines, foods, dyes, or preservatives pregnant or trying to get pregnant breast-feeding How should I use this medication? This drug is given as an infusion into a vein. It is administered in a hospital or clinic by a specially trained health care professional. Talk to your pediatrician regarding the use of this medicine in children. Special care may be needed. Overdosage: If you think you have taken too much of this medicine contact a poison control center or emergency room at once. NOTE: This medicine is only for you. Do not share this medicine with others. What if I miss a dose? It is important not to miss your dose. Call your doctor or health care professional if you are unable to keep an appointment. What may interact with this medication? Do not take this medicine with any of the following medications: live virus vaccines This medicine may also interact with the following medications: antiviral medicines for hepatitis, HIV or AIDS certain antibiotics like erythromycin and clarithromycin certain medicines for fungal infections like ketoconazole and itraconazole certain medicines for seizures like carbamazepine, phenobarbital, phenytoin gemfibrozil nefazodone rifampin St. John's wort This list may not describe all possible interactions. Give your health care provider a list of all the medicines, herbs, non-prescription drugs, or dietary supplements you use. Also tell them if you smoke, drink alcohol, or use illegal drugs. Some items may interact with your medicine. What should I watch for while using this medication? Your condition will be monitored carefully while you are receiving this medicine. You  will need important blood work done while you are taking this medicine. This medicine can cause serious allergic reactions. To reduce your risk you will need to take other medicine(s) before treatment with this medicine. If you experience allergic reactions like skin rash, itching or hives, swelling of the face, lips, or tongue, tell your doctor or health care professional right away. In some cases, you may be given additional medicines to help with side effects. Follow all directions for their use. This drug may make you feel generally unwell. This is not uncommon, as chemotherapy can affect healthy cells as well as cancer cells. Report any side effects. Continue your course of treatment even though you feel ill unless your doctor tells you to stop. Call your doctor or health care professional for advice if you get a fever, chills or sore throat, or other symptoms of a cold or flu. Do not treat yourself. This drug decreases your body's ability to fight infections. Try to avoid being around people who are sick. This medicine may increase your risk to bruise or bleed. Call your doctor or health care professional if you notice any unusual bleeding. Be careful brushing and flossing your teeth or using a toothpick because you may get an infection or bleed more easily. If you have any dental work done, tell your dentist you are receiving this medicine. Avoid taking products that contain aspirin, acetaminophen, ibuprofen, naproxen, or ketoprofen unless instructed  by your doctor. These medicines may hide a fever. Do not become pregnant while taking this medicine. Women should inform their doctor if they wish to become pregnant or think they might be pregnant. There is a potential for serious side effects to an unborn child. Talk to your health care professional or pharmacist for more information. Do not breast-feed an infant while taking this medicine. Men are advised not to father a child while receiving this  medicine. This product may contain alcohol. Ask your pharmacist or healthcare provider if this medicine contains alcohol. Be sure to tell all healthcare providers you are taking this medicine. Certain medicines, like metronidazole and disulfiram, can cause an unpleasant reaction when taken with alcohol. The reaction includes flushing, headache, nausea, vomiting, sweating, and increased thirst. The reaction can last from 30 minutes to several hours. What side effects may I notice from receiving this medication? Side effects that you should report to your doctor or health care professional as soon as possible: allergic reactions like skin rash, itching or hives, swelling of the face, lips, or tongue breathing problems changes in vision fast, irregular heartbeat high or low blood pressure mouth sores pain, tingling, numbness in the hands or feet signs of decreased platelets or bleeding - bruising, pinpoint red spots on the skin, black, tarry stools, blood in the urine signs of decreased red blood cells - unusually weak or tired, feeling faint or lightheaded, falls signs of infection - fever or chills, cough, sore throat, pain or difficulty passing urine signs and symptoms of liver injury like dark yellow or brown urine; general ill feeling or flu-like symptoms; light-colored stools; loss of appetite; nausea; right upper belly pain; unusually weak or tired; yellowing of the eyes or skin swelling of the ankles, feet, hands unusually slow heartbeat Side effects that usually do not require medical attention (report to your doctor or health care professional if they continue or are bothersome): diarrhea hair loss loss of appetite muscle or joint pain nausea, vomiting pain, redness, or irritation at site where injected tiredness This list may not describe all possible side effects. Call your doctor for medical advice about side effects. You may report side effects to FDA at 1-800-FDA-1088. Where  should I keep my medication? This drug is given in a hospital or clinic and will not be stored at home. NOTE: This sheet is a summary. It may not cover all possible information. If you have questions about this medicine, talk to your doctor, pharmacist, or health care provider.  2022 Elsevier/Gold Standard (2021-08-18 00:00:00)  Carboplatin injection What is this medication? CARBOPLATIN (KAR boe pla tin) is a chemotherapy drug. It targets fast dividing cells, like cancer cells, and causes these cells to die. This medicine is used to treat ovarian cancer and many other cancers. This medicine may be used for other purposes; ask your health care provider or pharmacist if you have questions. COMMON BRAND NAME(S): Paraplatin What should I tell my care team before I take this medication? They need to know if you have any of these conditions: blood disorders hearing problems kidney disease recent or ongoing radiation therapy an unusual or allergic reaction to carboplatin, cisplatin, other chemotherapy, other medicines, foods, dyes, or preservatives pregnant or trying to get pregnant breast-feeding How should I use this medication? This drug is usually given as an infusion into a vein. It is administered in a hospital or clinic by a specially trained health care professional. Talk to your pediatrician regarding the use of this medicine  in children. Special care may be needed. Overdosage: If you think you have taken too much of this medicine contact a poison control center or emergency room at once. NOTE: This medicine is only for you. Do not share this medicine with others. What if I miss a dose? It is important not to miss a dose. Call your doctor or health care professional if you are unable to keep an appointment. What may interact with this medication? medicines for seizures medicines to increase blood counts like filgrastim, pegfilgrastim, sargramostim some antibiotics like amikacin,  gentamicin, neomycin, streptomycin, tobramycin vaccines Talk to your doctor or health care professional before taking any of these medicines: acetaminophen aspirin ibuprofen ketoprofen naproxen This list may not describe all possible interactions. Give your health care provider a list of all the medicines, herbs, non-prescription drugs, or dietary supplements you use. Also tell them if you smoke, drink alcohol, or use illegal drugs. Some items may interact with your medicine. What should I watch for while using this medication? Your condition will be monitored carefully while you are receiving this medicine. You will need important blood work done while you are taking this medicine. This drug may make you feel generally unwell. This is not uncommon, as chemotherapy can affect healthy cells as well as cancer cells. Report any side effects. Continue your course of treatment even though you feel ill unless your doctor tells you to stop. In some cases, you may be given additional medicines to help with side effects. Follow all directions for their use. Call your doctor or health care professional for advice if you get a fever, chills or sore throat, or other symptoms of a cold or flu. Do not treat yourself. This drug decreases your body's ability to fight infections. Try to avoid being around people who are sick. This medicine may increase your risk to bruise or bleed. Call your doctor or health care professional if you notice any unusual bleeding. Be careful brushing and flossing your teeth or using a toothpick because you may get an infection or bleed more easily. If you have any dental work done, tell your dentist you are receiving this medicine. Avoid taking products that contain aspirin, acetaminophen, ibuprofen, naproxen, or ketoprofen unless instructed by your doctor. These medicines may hide a fever. Do not become pregnant while taking this medicine. Women should inform their doctor if they wish  to become pregnant or think they might be pregnant. There is a potential for serious side effects to an unborn child. Talk to your health care professional or pharmacist for more information. Do not breast-feed an infant while taking this medicine. What side effects may I notice from receiving this medication? Side effects that you should report to your doctor or health care professional as soon as possible: allergic reactions like skin rash, itching or hives, swelling of the face, lips, or tongue signs of infection - fever or chills, cough, sore throat, pain or difficulty passing urine signs of decreased platelets or bleeding - bruising, pinpoint red spots on the skin, black, tarry stools, nosebleeds signs of decreased red blood cells - unusually weak or tired, fainting spells, lightheadedness breathing problems changes in hearing changes in vision chest pain high blood pressure low blood counts - This drug may decrease the number of white blood cells, red blood cells and platelets. You may be at increased risk for infections and bleeding. nausea and vomiting pain, swelling, redness or irritation at the injection site pain, tingling, numbness in the hands or  feet problems with balance, talking, walking trouble passing urine or change in the amount of urine Side effects that usually do not require medical attention (report to your doctor or health care professional if they continue or are bothersome): hair loss loss of appetite metallic taste in the mouth or changes in taste This list may not describe all possible side effects. Call your doctor for medical advice about side effects. You may report side effects to FDA at 1-800-FDA-1088. Where should I keep my medication? This drug is given in a hospital or clinic and will not be stored at home. NOTE: This sheet is a summary. It may not cover all possible information. If you have questions about this medicine, talk to your doctor, pharmacist,  or health care provider.  2022 Elsevier/Gold Standard (2008-05-08 00:00:00)

## 2021-11-12 NOTE — Progress Notes (Signed)
  [facility] OFFICE PROGRESS NOTE   Diagnosis: Gastric cancer  INTERVAL HISTORY:   Zachary Bowers returns as scheduled.  He began radiation yesterday.  He notes some difficulty swallowing large chunks of food.  Otherwise no issues.  He denies nausea.  He took dexamethasone as prescribed last night and again this morning.  He continues oral iron.  Objective:  Vital signs in last 24 hours:  Blood pressure (!) 150/88, pulse 89, temperature 97.8 F (36.6 C), temperature source Oral, resp. rate 20, height 6' (1.829 m), weight 237 lb 9.6 oz (107.8 kg), SpO2 100 %.    HEENT: No thrush or ulcers. Resp: Lungs clear bilaterally. Cardio: Regular rate and rhythm. GI: Abdomen soft and nontender.  No hepatosplenomegaly. Vascular: No leg edema.   Lab Results:  Lab Results  Component Value Date   WBC 5.6 11/12/2021   HGB 10.9 (L) 11/12/2021   HCT 36.6 (L) 11/12/2021   MCV 77.5 (L) 11/12/2021   PLT 313 11/12/2021   NEUTROABS 5.1 11/12/2021    Imaging:  No results found.  Medications: I have reviewed the patient's current medications.  Assessment/Plan: Gastric cancer CT abdomen/pelvis 10/08/2021-probable mass in the proximal stomach, enlarged gastropathic ligament node Upper endoscopy 10/01/2021-gastric cardia/fundus mass with oozing and stigmata of recent bleeding-biopsy at least intramucosal adenocarcinoma, mismatch repair protein expression intact, PD-L1 combined positive score 7%, HER2 positive PET 10/20/2021-hypermetabolic proximal gastric primary, or hypermetabolic gastropathic ligament node, right paramidline prostatic hypermetabolism, anal hypermetabolism without a CT mass Radiation 11/11/2021 Cycle 1 week 1 Taxol/carboplatin 11/12/2021 Microcytic anemia secondary to #1 Diabetes Hypertension Coronary artery disease, cardiac catheterization 08/11/2021-placement of proximal and distal right coronary artery stents Hypothyroidism Sleep apnea Anal hypermetabolism on  the PET 10/20/2021-no mass identified    Disposition: Zachary Bowers has locally advanced gastric cardia carcinoma.  He began radiation yesterday.  He is scheduled to begin weekly Taxol/carboplatin today.  We again reviewed potential toxicities.  He agrees to proceed.  CBC reviewed.  Counts adequate to proceed as above.  He will return for lab, follow-up, cycle 2 weekly Taxol/carboplatin in 1 week.  He will contact the office in the interim with any problems.    Ned Card ANP/GNP-BC   11/12/2021  8:42 AM

## 2021-11-13 ENCOUNTER — Ambulatory Visit
Admission: RE | Admit: 2021-11-13 | Discharge: 2021-11-13 | Disposition: A | Discharge status: Home / Self Care | Payer: Medicare PPO | Source: Ambulatory Visit | Attending: Radiation Oncology | Admitting: Radiation Oncology

## 2021-11-13 ENCOUNTER — Telehealth: Payer: Self-pay | Admitting: Emergency Medicine

## 2021-11-13 ENCOUNTER — Other Ambulatory Visit: Payer: Self-pay | Admitting: Radiation Oncology

## 2021-11-13 DIAGNOSIS — Z5111 Encounter for antineoplastic chemotherapy: Secondary | ICD-10-CM | POA: Diagnosis not present

## 2021-11-13 DIAGNOSIS — E039 Hypothyroidism, unspecified: Secondary | ICD-10-CM | POA: Diagnosis not present

## 2021-11-13 DIAGNOSIS — E119 Type 2 diabetes mellitus without complications: Secondary | ICD-10-CM | POA: Diagnosis not present

## 2021-11-13 DIAGNOSIS — D509 Iron deficiency anemia, unspecified: Secondary | ICD-10-CM | POA: Diagnosis not present

## 2021-11-13 DIAGNOSIS — G4730 Sleep apnea, unspecified: Secondary | ICD-10-CM | POA: Diagnosis not present

## 2021-11-13 DIAGNOSIS — Z510 Encounter for antineoplastic radiation therapy: Secondary | ICD-10-CM | POA: Diagnosis not present

## 2021-11-13 DIAGNOSIS — I10 Essential (primary) hypertension: Secondary | ICD-10-CM | POA: Diagnosis not present

## 2021-11-13 DIAGNOSIS — I2510 Atherosclerotic heart disease of native coronary artery without angina pectoris: Secondary | ICD-10-CM | POA: Diagnosis not present

## 2021-11-13 DIAGNOSIS — C160 Malignant neoplasm of cardia: Secondary | ICD-10-CM

## 2021-11-13 MED ORDER — SONAFINE EX EMUL
1.0000 "application " | Freq: Once | CUTANEOUS | Status: AC
  Administered 2021-11-13: 1 via TOPICAL

## 2021-11-13 MED ORDER — METOCLOPRAMIDE HCL 10 MG PO TABS: 10.0000 mg | ORAL_TABLET | Freq: Four times a day (QID) | ORAL | 1 refills | Status: DC | PRN

## 2021-11-13 NOTE — Telephone Encounter (Signed)
24 Hour Callback 24 hour callback for 1st time Taxol and Carboplatin. Pt is without any side effects, states he is doing fine. Pt reminded to call with any questions or problems

## 2021-11-13 NOTE — Progress Notes (Signed)
Pt here for patient teaching.  Pt given Radiation and You booklet, skin care instructions, and Sonafine.  Reviewed areas of pertinence such as diarrhea, fatigue, hair loss, nausea and vomiting, skin changes, and urinary and bladder changes . Pt able to give teach back of to pat skin and use unscented/gentle soap,apply Sonafine bid and avoid applying anything to skin within 4 hours of treatment. Pt verbalizes understanding of information given and will contact nursing with any questions or concerns.  Wife present for education.  Gloriajean Dell. Leonie Green, BSN

## 2021-11-15 ENCOUNTER — Other Ambulatory Visit: Payer: Self-pay | Admitting: Oncology

## 2021-11-16 ENCOUNTER — Other Ambulatory Visit: Payer: Self-pay

## 2021-11-16 ENCOUNTER — Ambulatory Visit
Admission: RE | Admit: 2021-11-16 | Discharge: 2021-11-16 | Disposition: A | Discharge status: Home / Self Care | Payer: Medicare PPO | Source: Ambulatory Visit | Attending: Radiation Oncology | Admitting: Radiation Oncology

## 2021-11-16 DIAGNOSIS — Z5111 Encounter for antineoplastic chemotherapy: Secondary | ICD-10-CM | POA: Diagnosis not present

## 2021-11-16 DIAGNOSIS — E039 Hypothyroidism, unspecified: Secondary | ICD-10-CM | POA: Diagnosis not present

## 2021-11-16 DIAGNOSIS — I10 Essential (primary) hypertension: Secondary | ICD-10-CM | POA: Diagnosis not present

## 2021-11-16 DIAGNOSIS — D509 Iron deficiency anemia, unspecified: Secondary | ICD-10-CM | POA: Diagnosis not present

## 2021-11-16 DIAGNOSIS — G4730 Sleep apnea, unspecified: Secondary | ICD-10-CM | POA: Diagnosis not present

## 2021-11-16 DIAGNOSIS — I2510 Atherosclerotic heart disease of native coronary artery without angina pectoris: Secondary | ICD-10-CM | POA: Diagnosis not present

## 2021-11-16 DIAGNOSIS — E119 Type 2 diabetes mellitus without complications: Secondary | ICD-10-CM | POA: Diagnosis not present

## 2021-11-16 DIAGNOSIS — C160 Malignant neoplasm of cardia: Secondary | ICD-10-CM | POA: Diagnosis not present

## 2021-11-16 DIAGNOSIS — Z510 Encounter for antineoplastic radiation therapy: Secondary | ICD-10-CM | POA: Diagnosis not present

## 2021-11-17 ENCOUNTER — Ambulatory Visit
Admission: RE | Admit: 2021-11-17 | Discharge: 2021-11-17 | Disposition: A | Discharge status: Home / Self Care | Payer: Medicare PPO | Source: Ambulatory Visit | Attending: Radiation Oncology | Admitting: Radiation Oncology

## 2021-11-17 DIAGNOSIS — G4730 Sleep apnea, unspecified: Secondary | ICD-10-CM | POA: Diagnosis not present

## 2021-11-17 DIAGNOSIS — D509 Iron deficiency anemia, unspecified: Secondary | ICD-10-CM | POA: Diagnosis not present

## 2021-11-17 DIAGNOSIS — I10 Essential (primary) hypertension: Secondary | ICD-10-CM | POA: Diagnosis not present

## 2021-11-17 DIAGNOSIS — Z510 Encounter for antineoplastic radiation therapy: Secondary | ICD-10-CM | POA: Diagnosis not present

## 2021-11-17 DIAGNOSIS — E039 Hypothyroidism, unspecified: Secondary | ICD-10-CM | POA: Diagnosis not present

## 2021-11-17 DIAGNOSIS — Z5111 Encounter for antineoplastic chemotherapy: Secondary | ICD-10-CM | POA: Diagnosis not present

## 2021-11-17 DIAGNOSIS — C160 Malignant neoplasm of cardia: Secondary | ICD-10-CM | POA: Diagnosis not present

## 2021-11-17 DIAGNOSIS — I2510 Atherosclerotic heart disease of native coronary artery without angina pectoris: Secondary | ICD-10-CM | POA: Diagnosis not present

## 2021-11-17 DIAGNOSIS — E119 Type 2 diabetes mellitus without complications: Secondary | ICD-10-CM | POA: Diagnosis not present

## 2021-11-18 ENCOUNTER — Other Ambulatory Visit: Payer: Self-pay

## 2021-11-18 ENCOUNTER — Ambulatory Visit
Admission: RE | Admit: 2021-11-18 | Discharge: 2021-11-18 | Disposition: A | Discharge status: Home / Self Care | Payer: Medicare PPO | Source: Ambulatory Visit | Attending: Radiation Oncology | Admitting: Radiation Oncology

## 2021-11-18 DIAGNOSIS — G4730 Sleep apnea, unspecified: Secondary | ICD-10-CM | POA: Diagnosis not present

## 2021-11-18 DIAGNOSIS — E039 Hypothyroidism, unspecified: Secondary | ICD-10-CM | POA: Diagnosis not present

## 2021-11-18 DIAGNOSIS — E119 Type 2 diabetes mellitus without complications: Secondary | ICD-10-CM | POA: Diagnosis not present

## 2021-11-18 DIAGNOSIS — I10 Essential (primary) hypertension: Secondary | ICD-10-CM | POA: Diagnosis not present

## 2021-11-18 DIAGNOSIS — D509 Iron deficiency anemia, unspecified: Secondary | ICD-10-CM | POA: Diagnosis not present

## 2021-11-18 DIAGNOSIS — I2510 Atherosclerotic heart disease of native coronary artery without angina pectoris: Secondary | ICD-10-CM | POA: Diagnosis not present

## 2021-11-18 DIAGNOSIS — Z510 Encounter for antineoplastic radiation therapy: Secondary | ICD-10-CM | POA: Diagnosis not present

## 2021-11-18 DIAGNOSIS — Z5111 Encounter for antineoplastic chemotherapy: Secondary | ICD-10-CM | POA: Diagnosis not present

## 2021-11-18 DIAGNOSIS — C160 Malignant neoplasm of cardia: Secondary | ICD-10-CM | POA: Diagnosis not present

## 2021-11-19 ENCOUNTER — Other Ambulatory Visit: Payer: Self-pay

## 2021-11-19 ENCOUNTER — Inpatient Hospital Stay: Payer: Medicare PPO | Admitting: Oncology

## 2021-11-19 ENCOUNTER — Encounter: Payer: Self-pay | Admitting: *Deleted

## 2021-11-19 ENCOUNTER — Inpatient Hospital Stay: Payer: Medicare PPO

## 2021-11-19 ENCOUNTER — Ambulatory Visit
Admission: RE | Admit: 2021-11-19 | Discharge: 2021-11-19 | Disposition: A | Discharge status: Home / Self Care | Payer: Medicare PPO | Source: Ambulatory Visit | Attending: Radiation Oncology | Admitting: Radiation Oncology

## 2021-11-19 VITALS — BP 133/76 | HR 81 | Temp 98.1°F | Resp 18 | Ht 72.0 in | Wt 234.0 lb

## 2021-11-19 VITALS — BP 124/76 | HR 80 | Temp 97.9°F | Resp 18

## 2021-11-19 DIAGNOSIS — Z5111 Encounter for antineoplastic chemotherapy: Secondary | ICD-10-CM | POA: Diagnosis not present

## 2021-11-19 DIAGNOSIS — C160 Malignant neoplasm of cardia: Secondary | ICD-10-CM | POA: Diagnosis not present

## 2021-11-19 DIAGNOSIS — G4730 Sleep apnea, unspecified: Secondary | ICD-10-CM | POA: Diagnosis not present

## 2021-11-19 DIAGNOSIS — D509 Iron deficiency anemia, unspecified: Secondary | ICD-10-CM | POA: Diagnosis not present

## 2021-11-19 DIAGNOSIS — I10 Essential (primary) hypertension: Secondary | ICD-10-CM | POA: Diagnosis not present

## 2021-11-19 DIAGNOSIS — D500 Iron deficiency anemia secondary to blood loss (chronic): Secondary | ICD-10-CM

## 2021-11-19 DIAGNOSIS — Z510 Encounter for antineoplastic radiation therapy: Secondary | ICD-10-CM | POA: Diagnosis not present

## 2021-11-19 DIAGNOSIS — E039 Hypothyroidism, unspecified: Secondary | ICD-10-CM | POA: Diagnosis not present

## 2021-11-19 DIAGNOSIS — E119 Type 2 diabetes mellitus without complications: Secondary | ICD-10-CM | POA: Diagnosis not present

## 2021-11-19 DIAGNOSIS — I2510 Atherosclerotic heart disease of native coronary artery without angina pectoris: Secondary | ICD-10-CM | POA: Diagnosis not present

## 2021-11-19 LAB — CBC WITH DIFFERENTIAL (CANCER CENTER ONLY)
Abs Immature Granulocytes: 0.04 10*3/uL (ref 0.00–0.07)
Basophils Absolute: 0 10*3/uL (ref 0.0–0.1)
Basophils Relative: 1 %
Eosinophils Absolute: 0.2 10*3/uL (ref 0.0–0.5)
Eosinophils Relative: 6 %
HCT: 32.7 % — ABNORMAL LOW (ref 39.0–52.0)
Hemoglobin: 10.2 g/dL — ABNORMAL LOW (ref 13.0–17.0)
Immature Granulocytes: 1 %
Lymphocytes Relative: 8 %
Lymphs Abs: 0.3 10*3/uL — ABNORMAL LOW (ref 0.7–4.0)
MCH: 23.7 pg — ABNORMAL LOW (ref 26.0–34.0)
MCHC: 31.2 g/dL (ref 30.0–36.0)
MCV: 76 fL — ABNORMAL LOW (ref 80.0–100.0)
Monocytes Absolute: 0.3 10*3/uL (ref 0.1–1.0)
Monocytes Relative: 9 %
Neutro Abs: 2.3 10*3/uL (ref 1.7–7.7)
Neutrophils Relative %: 75 %
Platelet Count: 288 10*3/uL (ref 150–400)
RBC: 4.3 MIL/uL (ref 4.22–5.81)
RDW: 19.8 % — ABNORMAL HIGH (ref 11.5–15.5)
WBC Count: 3 10*3/uL — ABNORMAL LOW (ref 4.0–10.5)
nRBC: 0 % (ref 0.0–0.2)

## 2021-11-19 LAB — CMP (CANCER CENTER ONLY)
ALT: 10 U/L (ref 0–44)
AST: 10 U/L — ABNORMAL LOW (ref 15–41)
Albumin: 4.3 g/dL (ref 3.5–5.0)
Alkaline Phosphatase: 78 U/L (ref 38–126)
Anion gap: 8 (ref 5–15)
BUN: 12 mg/dL (ref 8–23)
CO2: 24 mmol/L (ref 22–32)
Calcium: 9.5 mg/dL (ref 8.9–10.3)
Chloride: 105 mmol/L (ref 98–111)
Creatinine: 1.02 mg/dL (ref 0.61–1.24)
GFR, Estimated: >60 mL/min (ref >60)
Glucose, Bld: 246 mg/dL — ABNORMAL HIGH (ref 70–99)
Potassium: 4 mmol/L (ref 3.5–5.1)
Sodium: 137 mmol/L (ref 135–145)
Total Bilirubin: 0.5 mg/dL (ref 0.3–1.2)
Total Protein: 7.1 g/dL (ref 6.5–8.1)

## 2021-11-19 MED ORDER — SODIUM CHLORIDE 0.9 % IV SOLN: Freq: Once | INTRAVENOUS | Status: AC

## 2021-11-19 MED ORDER — SODIUM CHLORIDE 0.9 % IV SOLN
50.0000 mg/m2 | Freq: Once | INTRAVENOUS | Status: AC
  Administered 2021-11-19: 120 mg via INTRAVENOUS
  Filled 2021-11-19: qty 20

## 2021-11-19 MED ORDER — SODIUM CHLORIDE 0.9 % IV SOLN
10.0000 mg | Freq: Once | INTRAVENOUS | Status: AC
  Administered 2021-11-19: 10 mg via INTRAVENOUS
  Filled 2021-11-19: qty 1

## 2021-11-19 MED ORDER — PALONOSETRON HCL INJECTION 0.25 MG/5ML
0.2500 mg | Freq: Once | INTRAVENOUS | Status: AC
  Administered 2021-11-19: 0.25 mg via INTRAVENOUS
  Filled 2021-11-19: qty 5

## 2021-11-19 MED ORDER — DIPHENHYDRAMINE HCL 50 MG/ML IJ SOLN
25.0000 mg | Freq: Once | INTRAMUSCULAR | Status: AC
  Administered 2021-11-19: 25 mg via INTRAVENOUS
  Filled 2021-11-19: qty 1

## 2021-11-19 MED ORDER — SODIUM CHLORIDE 0.9 % IV SOLN
267.0000 mg | Freq: Once | INTRAVENOUS | Status: AC
  Administered 2021-11-19: 270 mg via INTRAVENOUS
  Filled 2021-11-19: qty 27

## 2021-11-19 MED ORDER — FAMOTIDINE 20 MG IN NS 100 ML IVPB
20.0000 mg | Freq: Once | INTRAVENOUS | Status: AC
  Administered 2021-11-19: 20 mg via INTRAVENOUS
  Filled 2021-11-19: qty 100

## 2021-11-19 NOTE — Patient Instructions (Signed)
[facility]   Discharge Instructions: Thank you for choosing [facility] to provide your oncology and hematology care.   If you have a lab appointment with the [facility], please go directly to the [facility] and check in at the registration area.   Wear comfortable clothing and clothing appropriate for easy access to any Portacath or PICC line.   We strive to give you quality time with your provider. You may need to reschedule your appointment if you arrive late (15 or more minutes).  Arriving late affects you and other patients whose appointments are after yours.  Also, if you miss three or more appointments without notifying the office, you may be dismissed from the clinic at the provider's discretion.      For prescription refill requests, have your pharmacy contact our office and allow 72 hours for refills to be completed.    Today you received the following chemotherapy and/or immunotherapy agents Taxol, Carboplatin      To help prevent nausea and vomiting after your treatment, we encourage you to take your nausea medication as directed.  BELOW ARE SYMPTOMS THAT SHOULD BE REPORTED IMMEDIATELY: *FEVER GREATER THAN 100.4 F (38 C) OR HIGHER *CHILLS OR SWEATING *NAUSEA AND VOMITING THAT IS NOT CONTROLLED WITH YOUR NAUSEA MEDICATION *UNUSUAL SHORTNESS OF BREATH *UNUSUAL BRUISING OR BLEEDING *URINARY PROBLEMS (pain or burning when urinating, or frequent urination) *BOWEL PROBLEMS (unusual diarrhea, constipation, pain near the anus) TENDERNESS IN MOUTH AND THROAT WITH OR WITHOUT PRESENCE OF ULCERS (sore throat, sores in mouth, or a toothache) UNUSUAL RASH, SWELLING OR PAIN  UNUSUAL VAGINAL DISCHARGE OR ITCHING   Items with * indicate a potential emergency and should be followed up as soon as possible or go to the Emergency Department if any problems should occur.  Please show the CHEMOTHERAPY ALERT CARD or IMMUNOTHERAPY ALERT CARD at  check-in to the Emergency Department and triage nurse.  Should you have questions after your visit or need to cancel or reschedule your appointment, please contact [facility]  Dept: [phone]  and follow the prompts.  Office hours are 8:00 a.m. to 4:30 p.m. Monday - Friday. Please note that voicemails left after 4:00 p.m. may not be returned until the following business day.  We are closed weekends and major holidays. You have access to a nurse at all times for urgent questions. Please call the main number to the clinic Dept: [phone] and follow the prompts.   For any non-urgent questions, you may also contact your provider using MyChart. We now offer e-Visits for anyone 62 and older to request care online for non-urgent symptoms. For details visit [email or web address].   Also download the MyChart app! Go to the app store, search "MyChart", open the app, select [facility], and log in with your MyChart username and password.  Due to Covid, a mask is required upon entering the hospital/clinic. If you do not have a mask, one will be given to you upon arrival. For doctor visits, patients may have 1 support person aged 10 or older with them. For treatment visits, patients cannot have anyone with them due to current Covid guidelines and our immunocompromised population.   Paclitaxel injection What is this medication? PACLITAXEL (PAK li TAX el) is a chemotherapy drug. It targets fast dividing cells, like cancer cells, and causes these cells to die. This medicine is used to treat ovarian cancer, breast cancer, lung cancer, Kaposi's sarcoma, and other cancers. This  medicine may be used for other purposes; ask your health care provider or pharmacist if you have questions. COMMON BRAND NAME(S): Onxol, Taxol What should I tell my care team before I take this medication? They need to know if you have any of these conditions: history of irregular heartbeat liver  disease low blood counts, like low white cell, platelet, or red cell counts lung or breathing disease, like asthma tingling of the fingers or toes, or other nerve disorder an unusual or allergic reaction to paclitaxel, alcohol, polyoxyethylated castor oil, other chemotherapy, other medicines, foods, dyes, or preservatives pregnant or trying to get pregnant breast-feeding How should I use this medication? This drug is given as an infusion into a vein. It is administered in a hospital or clinic by a specially trained health care professional. Talk to your pediatrician regarding the use of this medicine in children. Special care may be needed. Overdosage: If you think you have taken too much of this medicine contact a poison control center or emergency room at once. NOTE: This medicine is only for you. Do not share this medicine with others. What if I miss a dose? It is important not to miss your dose. Call your doctor or health care professional if you are unable to keep an appointment. What may interact with this medication? Do not take this medicine with any of the following medications: live virus vaccines This medicine may also interact with the following medications: antiviral medicines for hepatitis, HIV or AIDS certain antibiotics like erythromycin and clarithromycin certain medicines for fungal infections like ketoconazole and itraconazole certain medicines for seizures like carbamazepine, phenobarbital, phenytoin gemfibrozil nefazodone rifampin St. John's wort This list may not describe all possible interactions. Give your health care provider a list of all the medicines, herbs, non-prescription drugs, or dietary supplements you use. Also tell them if you smoke, drink alcohol, or use illegal drugs. Some items may interact with your medicine. What should I watch for while using this medication? Your condition will be monitored carefully while you are receiving this medicine. You  will need important blood work done while you are taking this medicine. This medicine can cause serious allergic reactions. To reduce your risk you will need to take other medicine(s) before treatment with this medicine. If you experience allergic reactions like skin rash, itching or hives, swelling of the face, lips, or tongue, tell your doctor or health care professional right away. In some cases, you may be given additional medicines to help with side effects. Follow all directions for their use. This drug may make you feel generally unwell. This is not uncommon, as chemotherapy can affect healthy cells as well as cancer cells. Report any side effects. Continue your course of treatment even though you feel ill unless your doctor tells you to stop. Call your doctor or health care professional for advice if you get a fever, chills or sore throat, or other symptoms of a cold or flu. Do not treat yourself. This drug decreases your body's ability to fight infections. Try to avoid being around people who are sick. This medicine may increase your risk to bruise or bleed. Call your doctor or health care professional if you notice any unusual bleeding. Be careful brushing and flossing your teeth or using a toothpick because you may get an infection or bleed more easily. If you have any dental work done, tell your dentist you are receiving this medicine. Avoid taking products that contain aspirin, acetaminophen, ibuprofen, naproxen, or ketoprofen unless instructed  by your doctor. These medicines may hide a fever. Do not become pregnant while taking this medicine. Women should inform their doctor if they wish to become pregnant or think they might be pregnant. There is a potential for serious side effects to an unborn child. Talk to your health care professional or pharmacist for more information. Do not breast-feed an infant while taking this medicine. Men are advised not to father a child while receiving this  medicine. This product may contain alcohol. Ask your pharmacist or healthcare provider if this medicine contains alcohol. Be sure to tell all healthcare providers you are taking this medicine. Certain medicines, like metronidazole and disulfiram, can cause an unpleasant reaction when taken with alcohol. The reaction includes flushing, headache, nausea, vomiting, sweating, and increased thirst. The reaction can last from 30 minutes to several hours. What side effects may I notice from receiving this medication? Side effects that you should report to your doctor or health care professional as soon as possible: allergic reactions like skin rash, itching or hives, swelling of the face, lips, or tongue breathing problems changes in vision fast, irregular heartbeat high or low blood pressure mouth sores pain, tingling, numbness in the hands or feet signs of decreased platelets or bleeding - bruising, pinpoint red spots on the skin, black, tarry stools, blood in the urine signs of decreased red blood cells - unusually weak or tired, feeling faint or lightheaded, falls signs of infection - fever or chills, cough, sore throat, pain or difficulty passing urine signs and symptoms of liver injury like dark yellow or brown urine; general ill feeling or flu-like symptoms; light-colored stools; loss of appetite; nausea; right upper belly pain; unusually weak or tired; yellowing of the eyes or skin swelling of the ankles, feet, hands unusually slow heartbeat Side effects that usually do not require medical attention (report to your doctor or health care professional if they continue or are bothersome): diarrhea hair loss loss of appetite muscle or joint pain nausea, vomiting pain, redness, or irritation at site where injected tiredness This list may not describe all possible side effects. Call your doctor for medical advice about side effects. You may report side effects to FDA at 1-800-FDA-1088. Where  should I keep my medication? This drug is given in a hospital or clinic and will not be stored at home. NOTE: This sheet is a summary. It may not cover all possible information. If you have questions about this medicine, talk to your doctor, pharmacist, or health care provider.  2022 Elsevier/Gold Standard (2021-08-18 00:00:00)  Carboplatin injection What is this medication? CARBOPLATIN (KAR boe pla tin) is a chemotherapy drug. It targets fast dividing cells, like cancer cells, and causes these cells to die. This medicine is used to treat ovarian cancer and many other cancers. This medicine may be used for other purposes; ask your health care provider or pharmacist if you have questions. COMMON BRAND NAME(S): Paraplatin What should I tell my care team before I take this medication? They need to know if you have any of these conditions: blood disorders hearing problems kidney disease recent or ongoing radiation therapy an unusual or allergic reaction to carboplatin, cisplatin, other chemotherapy, other medicines, foods, dyes, or preservatives pregnant or trying to get pregnant breast-feeding How should I use this medication? This drug is usually given as an infusion into a vein. It is administered in a hospital or clinic by a specially trained health care professional. Talk to your pediatrician regarding the use of this medicine  in children. Special care may be needed. Overdosage: If you think you have taken too much of this medicine contact a poison control center or emergency room at once. NOTE: This medicine is only for you. Do not share this medicine with others. What if I miss a dose? It is important not to miss a dose. Call your doctor or health care professional if you are unable to keep an appointment. What may interact with this medication? medicines for seizures medicines to increase blood counts like filgrastim, pegfilgrastim, sargramostim some antibiotics like amikacin,  gentamicin, neomycin, streptomycin, tobramycin vaccines Talk to your doctor or health care professional before taking any of these medicines: acetaminophen aspirin ibuprofen ketoprofen naproxen This list may not describe all possible interactions. Give your health care provider a list of all the medicines, herbs, non-prescription drugs, or dietary supplements you use. Also tell them if you smoke, drink alcohol, or use illegal drugs. Some items may interact with your medicine. What should I watch for while using this medication? Your condition will be monitored carefully while you are receiving this medicine. You will need important blood work done while you are taking this medicine. This drug may make you feel generally unwell. This is not uncommon, as chemotherapy can affect healthy cells as well as cancer cells. Report any side effects. Continue your course of treatment even though you feel ill unless your doctor tells you to stop. In some cases, you may be given additional medicines to help with side effects. Follow all directions for their use. Call your doctor or health care professional for advice if you get a fever, chills or sore throat, or other symptoms of a cold or flu. Do not treat yourself. This drug decreases your body's ability to fight infections. Try to avoid being around people who are sick. This medicine may increase your risk to bruise or bleed. Call your doctor or health care professional if you notice any unusual bleeding. Be careful brushing and flossing your teeth or using a toothpick because you may get an infection or bleed more easily. If you have any dental work done, tell your dentist you are receiving this medicine. Avoid taking products that contain aspirin, acetaminophen, ibuprofen, naproxen, or ketoprofen unless instructed by your doctor. These medicines may hide a fever. Do not become pregnant while taking this medicine. Women should inform their doctor if they wish  to become pregnant or think they might be pregnant. There is a potential for serious side effects to an unborn child. Talk to your health care professional or pharmacist for more information. Do not breast-feed an infant while taking this medicine. What side effects may I notice from receiving this medication? Side effects that you should report to your doctor or health care professional as soon as possible: allergic reactions like skin rash, itching or hives, swelling of the face, lips, or tongue signs of infection - fever or chills, cough, sore throat, pain or difficulty passing urine signs of decreased platelets or bleeding - bruising, pinpoint red spots on the skin, black, tarry stools, nosebleeds signs of decreased red blood cells - unusually weak or tired, fainting spells, lightheadedness breathing problems changes in hearing changes in vision chest pain high blood pressure low blood counts - This drug may decrease the number of white blood cells, red blood cells and platelets. You may be at increased risk for infections and bleeding. nausea and vomiting pain, swelling, redness or irritation at the injection site pain, tingling, numbness in the hands or  feet problems with balance, talking, walking trouble passing urine or change in the amount of urine Side effects that usually do not require medical attention (report to your doctor or health care professional if they continue or are bothersome): hair loss loss of appetite metallic taste in the mouth or changes in taste This list may not describe all possible side effects. Call your doctor for medical advice about side effects. You may report side effects to FDA at 1-800-FDA-1088. Where should I keep my medication? This drug is given in a hospital or clinic and will not be stored at home. NOTE: This sheet is a summary. It may not cover all possible information. If you have questions about this medicine, talk to your doctor, pharmacist,  or health care provider.  2022 Elsevier/Gold Standard (2008-05-08 00:00:00)

## 2021-11-19 NOTE — Progress Notes (Signed)
Completed FMLA form for his son, Jessy Cybulski and MD signed. Copy to HIM to scan and notified patient via VM that his form is ready for pick up.

## 2021-11-19 NOTE — Progress Notes (Signed)
  [facility] OFFICE PROGRESS NOTE   Diagnosis: Gastric cancer  INTERVAL HISTORY:   Mr. Sigg began gastric radiation on 11/11/2021.  He completed the first cycle of Taxol/carboplatin on 11/12/2021.  No nausea or symptom of allergic reaction.  He is tolerating a diet.  Objective:  Vital signs in last 24 hours:  Blood pressure 133/76, pulse 81, temperature 98.1 F (36.7 C), temperature source Oral, resp. rate 18, height 6' (1.829 m), weight 234 lb (106.1 kg), SpO2 100 %.    HEENT: No thrush or ulcers Resp: Lungs clear bilaterally Cardio: Regular rate and rhythm GI: Mild tenderness in the mid upper abdomen and right subcostal area, no hepatomegaly Vascular: No leg edema   Lab Results:  Lab Results  Component Value Date   WBC 3.0 (L) 11/19/2021   HGB 10.2 (L) 11/19/2021   HCT 32.7 (L) 11/19/2021   MCV 76.0 (L) 11/19/2021   PLT 288 11/19/2021   NEUTROABS 2.3 11/19/2021    CMP  Lab Results  Component Value Date   NA 134 (L) 11/12/2021   K 4.3 11/12/2021   CL 104 11/12/2021   CO2 21 (L) 11/12/2021   GLUCOSE 326 (H) 11/12/2021   BUN 17 11/12/2021   CREATININE 1.16 11/12/2021   CALCIUM 10.6 (H) 11/12/2021   PROT 8.0 11/12/2021   ALBUMIN 4.9 11/12/2021   AST 11 (L) 11/12/2021   ALT 13 11/12/2021   ALKPHOS 88 11/12/2021   BILITOT 0.5 11/12/2021   GFRNONAA >60 11/12/2021    Lab Results  Component Value Date   CEA 1.50 10/30/2021    Medications: I have reviewed the patient's current medications.   Assessment/Plan: Gastric cancer CT abdomen/pelvis 10/08/2021-probable mass in the proximal stomach, enlarged gastropathic ligament node Upper endoscopy 10/01/2021-gastric cardia/fundus mass with oozing and stigmata of recent bleeding-biopsy at least intramucosal adenocarcinoma, mismatch repair protein expression intact, PD-L1 combined positive score 7%, HER2 positive PET 10/20/2021-hypermetabolic proximal gastric primary, or hypermetabolic gastropathic  ligament node, right paramidline prostatic hypermetabolism, anal hypermetabolism without a CT mass Radiation 11/11/2021 Cycle 1  Taxol/carboplatin 11/12/2021 Cycle 2 Taxol/carboplatin 11/19/2021 Microcytic anemia secondary to #1 Diabetes Hypertension Coronary artery disease, cardiac catheterization 08/11/2021-placement of proximal and distal right coronary artery stents Hypothyroidism Sleep apnea Anal hypermetabolism on the PET 10/20/2021-no mass identified      Disposition: Mr. Lesko tolerated the first week of Taxol/carboplatin well.  He will complete week 2 today.  He will return for chemotherapy in 1 week.  He is scheduled for an office visit and chemotherapy in 2 weeks.  He will resume Prilosec.  He is scheduled to see Dr. Kipp Brood to discuss surgery.  Betsy Coder, MD  11/19/2021  10:30 AM

## 2021-11-19 NOTE — Progress Notes (Signed)
Patient presents for treatment. RN assessment completed along with the following:  Labs/vitals reviewed - Yes, and within treatment parameters.   Weight within 10% of previous measurement - Yes Oncology Treatment Attestation completed for current therapy- Yes, on date 10/28/21 Informed consent completed and reflects current therapy/intent - Yes, on date 11/12/21             Provider progress note reviewed - Yes, today's provider note was reviewed. Treatment/Antibody/Supportive plan reviewed - Yes, and there are no adjustments needed for today's treatment. S&H and other orders reviewed - Yes, and there are no additional orders identified. Previous treatment date reviewed - Yes, and the appropriate amount of time has elapsed between treatments.   Patient to proceed with treatment.    While attempting to obtain a peripheral IV, patient became diaphoretic and complained of dizziness and nauseousness.  Patient reclined and VS obtained (See MAR). A few minutes later, patient started to recover and another PIV site was attempted.  Patient stated he does not like needles and something similar may have happened "a long time ago". CBG checked at 1210 and it was 185.  Ned Card, NP was notified and ok to proceed with treatment was given. Patient able to complete remainder of infusion without any further issues.

## 2021-11-20 ENCOUNTER — Ambulatory Visit
Admission: RE | Admit: 2021-11-20 | Discharge: 2021-11-20 | Disposition: A | Discharge status: Home / Self Care | Payer: Medicare PPO | Source: Ambulatory Visit | Attending: Radiation Oncology | Admitting: Radiation Oncology

## 2021-11-20 DIAGNOSIS — E039 Hypothyroidism, unspecified: Secondary | ICD-10-CM | POA: Diagnosis not present

## 2021-11-20 DIAGNOSIS — Z510 Encounter for antineoplastic radiation therapy: Secondary | ICD-10-CM | POA: Diagnosis not present

## 2021-11-20 DIAGNOSIS — G4730 Sleep apnea, unspecified: Secondary | ICD-10-CM | POA: Diagnosis not present

## 2021-11-20 DIAGNOSIS — D509 Iron deficiency anemia, unspecified: Secondary | ICD-10-CM | POA: Diagnosis not present

## 2021-11-20 DIAGNOSIS — C160 Malignant neoplasm of cardia: Secondary | ICD-10-CM | POA: Diagnosis not present

## 2021-11-20 DIAGNOSIS — E119 Type 2 diabetes mellitus without complications: Secondary | ICD-10-CM | POA: Diagnosis not present

## 2021-11-20 DIAGNOSIS — I2510 Atherosclerotic heart disease of native coronary artery without angina pectoris: Secondary | ICD-10-CM | POA: Diagnosis not present

## 2021-11-20 DIAGNOSIS — Z5111 Encounter for antineoplastic chemotherapy: Secondary | ICD-10-CM | POA: Diagnosis not present

## 2021-11-20 DIAGNOSIS — I10 Essential (primary) hypertension: Secondary | ICD-10-CM | POA: Diagnosis not present

## 2021-11-21 ENCOUNTER — Other Ambulatory Visit: Payer: Self-pay | Admitting: Oncology

## 2021-11-23 ENCOUNTER — Inpatient Hospital Stay: Payer: Medicare PPO | Admitting: Nutrition

## 2021-11-23 ENCOUNTER — Ambulatory Visit
Admission: RE | Admit: 2021-11-23 | Discharge: 2021-11-23 | Disposition: A | Discharge status: Home / Self Care | Payer: Medicare PPO | Source: Ambulatory Visit | Attending: Radiation Oncology | Admitting: Radiation Oncology

## 2021-11-23 ENCOUNTER — Other Ambulatory Visit: Payer: Self-pay

## 2021-11-23 DIAGNOSIS — D509 Iron deficiency anemia, unspecified: Secondary | ICD-10-CM | POA: Diagnosis not present

## 2021-11-23 DIAGNOSIS — E119 Type 2 diabetes mellitus without complications: Secondary | ICD-10-CM | POA: Diagnosis not present

## 2021-11-23 DIAGNOSIS — C160 Malignant neoplasm of cardia: Secondary | ICD-10-CM | POA: Diagnosis not present

## 2021-11-23 DIAGNOSIS — I10 Essential (primary) hypertension: Secondary | ICD-10-CM | POA: Diagnosis not present

## 2021-11-23 DIAGNOSIS — Z510 Encounter for antineoplastic radiation therapy: Secondary | ICD-10-CM | POA: Diagnosis not present

## 2021-11-23 DIAGNOSIS — Z5111 Encounter for antineoplastic chemotherapy: Secondary | ICD-10-CM | POA: Diagnosis not present

## 2021-11-23 DIAGNOSIS — G4730 Sleep apnea, unspecified: Secondary | ICD-10-CM | POA: Diagnosis not present

## 2021-11-23 DIAGNOSIS — E039 Hypothyroidism, unspecified: Secondary | ICD-10-CM | POA: Diagnosis not present

## 2021-11-23 DIAGNOSIS — I2510 Atherosclerotic heart disease of native coronary artery without angina pectoris: Secondary | ICD-10-CM | POA: Diagnosis not present

## 2021-11-23 NOTE — Progress Notes (Signed)
Nutrition  Patient wanted to reschedule nutrition appointment from today at 11:15 to tomorrow 12/13 at 9am.  Nutrition appointment rescheduled for 12/13 at 9am per patient request.  Nola Botkins B. Zenia Resides, [place], [place] Registered Dietitian [phone] (mobile)

## 2021-11-24 ENCOUNTER — Inpatient Hospital Stay: Payer: Medicare PPO | Admitting: Nutrition

## 2021-11-24 ENCOUNTER — Ambulatory Visit
Admission: RE | Admit: 2021-11-24 | Discharge: 2021-11-24 | Disposition: A | Discharge status: Home / Self Care | Payer: Medicare PPO | Source: Ambulatory Visit | Attending: Radiation Oncology | Admitting: Radiation Oncology

## 2021-11-24 DIAGNOSIS — C160 Malignant neoplasm of cardia: Secondary | ICD-10-CM | POA: Diagnosis not present

## 2021-11-24 DIAGNOSIS — E039 Hypothyroidism, unspecified: Secondary | ICD-10-CM | POA: Diagnosis not present

## 2021-11-24 DIAGNOSIS — D509 Iron deficiency anemia, unspecified: Secondary | ICD-10-CM | POA: Diagnosis not present

## 2021-11-24 DIAGNOSIS — E119 Type 2 diabetes mellitus without complications: Secondary | ICD-10-CM | POA: Diagnosis not present

## 2021-11-24 DIAGNOSIS — Z5111 Encounter for antineoplastic chemotherapy: Secondary | ICD-10-CM | POA: Diagnosis not present

## 2021-11-24 DIAGNOSIS — Z510 Encounter for antineoplastic radiation therapy: Secondary | ICD-10-CM | POA: Diagnosis not present

## 2021-11-24 DIAGNOSIS — I2510 Atherosclerotic heart disease of native coronary artery without angina pectoris: Secondary | ICD-10-CM | POA: Diagnosis not present

## 2021-11-24 DIAGNOSIS — G4730 Sleep apnea, unspecified: Secondary | ICD-10-CM | POA: Diagnosis not present

## 2021-11-24 DIAGNOSIS — I10 Essential (primary) hypertension: Secondary | ICD-10-CM | POA: Diagnosis not present

## 2021-11-24 NOTE — Progress Notes (Signed)
65 year old male diagnosed with gastric cancer and followed by Dr. Benay Spice. Patient is receiving concurrent chemoradiation therapy.  Past medical history includes anemia, diabetes, hypertension, CAD, hypothyroidism, sleep apnea, and Prilosec.  Medications include Decadron, ferrous sulfate, Amaryl, Glucophage, Compazine, Carafate.  Labs include glucose of 246 December 8.  Height: 6 feet 0 inches. Weight: 234 pounds December 8. Usual body weight: 246 pounds in August. BMI: 31.74.  Patient reports he is eating well and denies poor appetite.  States he alternates between diarrhea and constipation.  He took [employer] when he was constipated and now is having some looser stools.  Reports eating 3 meals a day and snacks throughout the day.  Endorses weight loss but states it was unintentional after his heart surgery.  He has no questions or concerns today.  Nutrition diagnosis:  Food and nutrition related knowledge deficit related to gastric cancer and associated treatments as evidenced by no prior need for nutrition related information.  Intervention: Educated patient on potential side effects secondary to chemo and radiation.  Provided nutrition strategies for adequate calorie and protein intake. Encouraged baking soda and salt water rinses and provided nutrition fact sheets on oral care. Provided RD contact information.  Monitoring, evaluation, goals: Patient will tolerate adequate calories and protein for weight maintenance.  No follow-up scheduled.  Patient encouraged to call RD with any questions or concerns.  **Disclaimer: This note was dictated with voice recognition software. Similar sounding words can inadvertently be transcribed and this note may contain transcription errors which may not have been corrected upon publication of note.**

## 2021-11-25 ENCOUNTER — Ambulatory Visit
Admission: RE | Admit: 2021-11-25 | Discharge: 2021-11-25 | Disposition: A | Discharge status: Home / Self Care | Payer: Medicare PPO | Source: Ambulatory Visit | Attending: Radiation Oncology | Admitting: Radiation Oncology

## 2021-11-25 ENCOUNTER — Other Ambulatory Visit: Payer: Self-pay

## 2021-11-25 DIAGNOSIS — D509 Iron deficiency anemia, unspecified: Secondary | ICD-10-CM | POA: Diagnosis not present

## 2021-11-25 DIAGNOSIS — Z510 Encounter for antineoplastic radiation therapy: Secondary | ICD-10-CM | POA: Diagnosis not present

## 2021-11-25 DIAGNOSIS — Z5111 Encounter for antineoplastic chemotherapy: Secondary | ICD-10-CM | POA: Diagnosis not present

## 2021-11-25 DIAGNOSIS — I10 Essential (primary) hypertension: Secondary | ICD-10-CM | POA: Diagnosis not present

## 2021-11-25 DIAGNOSIS — I2510 Atherosclerotic heart disease of native coronary artery without angina pectoris: Secondary | ICD-10-CM | POA: Diagnosis not present

## 2021-11-25 DIAGNOSIS — E039 Hypothyroidism, unspecified: Secondary | ICD-10-CM | POA: Diagnosis not present

## 2021-11-25 DIAGNOSIS — G4730 Sleep apnea, unspecified: Secondary | ICD-10-CM | POA: Diagnosis not present

## 2021-11-25 DIAGNOSIS — E119 Type 2 diabetes mellitus without complications: Secondary | ICD-10-CM | POA: Diagnosis not present

## 2021-11-25 DIAGNOSIS — C160 Malignant neoplasm of cardia: Secondary | ICD-10-CM | POA: Diagnosis not present

## 2021-11-26 ENCOUNTER — Inpatient Hospital Stay: Payer: Medicare PPO

## 2021-11-26 ENCOUNTER — Ambulatory Visit
Admission: RE | Admit: 2021-11-26 | Discharge: 2021-11-26 | Disposition: A | Discharge status: Home / Self Care | Payer: Medicare PPO | Source: Ambulatory Visit | Attending: Radiation Oncology | Admitting: Radiation Oncology

## 2021-11-26 VITALS — BP 127/80 | HR 76 | Temp 98.4°F | Resp 20 | Wt 232.0 lb

## 2021-11-26 DIAGNOSIS — I2510 Atherosclerotic heart disease of native coronary artery without angina pectoris: Secondary | ICD-10-CM | POA: Diagnosis not present

## 2021-11-26 DIAGNOSIS — C160 Malignant neoplasm of cardia: Secondary | ICD-10-CM | POA: Diagnosis not present

## 2021-11-26 DIAGNOSIS — D500 Iron deficiency anemia secondary to blood loss (chronic): Secondary | ICD-10-CM

## 2021-11-26 DIAGNOSIS — G4730 Sleep apnea, unspecified: Secondary | ICD-10-CM | POA: Diagnosis not present

## 2021-11-26 DIAGNOSIS — E119 Type 2 diabetes mellitus without complications: Secondary | ICD-10-CM | POA: Diagnosis not present

## 2021-11-26 DIAGNOSIS — I10 Essential (primary) hypertension: Secondary | ICD-10-CM | POA: Diagnosis not present

## 2021-11-26 DIAGNOSIS — Z5111 Encounter for antineoplastic chemotherapy: Secondary | ICD-10-CM | POA: Diagnosis not present

## 2021-11-26 DIAGNOSIS — D509 Iron deficiency anemia, unspecified: Secondary | ICD-10-CM | POA: Diagnosis not present

## 2021-11-26 DIAGNOSIS — E039 Hypothyroidism, unspecified: Secondary | ICD-10-CM | POA: Diagnosis not present

## 2021-11-26 DIAGNOSIS — Z510 Encounter for antineoplastic radiation therapy: Secondary | ICD-10-CM | POA: Diagnosis not present

## 2021-11-26 LAB — CMP (CANCER CENTER ONLY)
ALT: 11 U/L (ref 0–44)
AST: 9 U/L — ABNORMAL LOW (ref 15–41)
Albumin: 4.2 g/dL (ref 3.5–5.0)
Alkaline Phosphatase: 71 U/L (ref 38–126)
Anion gap: 7 (ref 5–15)
BUN: 11 mg/dL (ref 8–23)
CO2: 25 mmol/L (ref 22–32)
Calcium: 9.3 mg/dL (ref 8.9–10.3)
Chloride: 105 mmol/L (ref 98–111)
Creatinine: 0.82 mg/dL (ref 0.61–1.24)
GFR, Estimated: >60 mL/min (ref >60)
Glucose, Bld: 170 mg/dL — ABNORMAL HIGH (ref 70–99)
Potassium: 3.8 mmol/L (ref 3.5–5.1)
Sodium: 137 mmol/L (ref 135–145)
Total Bilirubin: 0.5 mg/dL (ref 0.3–1.2)
Total Protein: 7.2 g/dL (ref 6.5–8.1)

## 2021-11-26 LAB — CBC WITH DIFFERENTIAL (CANCER CENTER ONLY)
Abs Immature Granulocytes: 0.03 10*3/uL (ref 0.00–0.07)
Basophils Absolute: 0 10*3/uL (ref 0.0–0.1)
Basophils Relative: 0 %
Eosinophils Absolute: 0.1 10*3/uL (ref 0.0–0.5)
Eosinophils Relative: 2 %
HCT: 35.4 % — ABNORMAL LOW (ref 39.0–52.0)
Hemoglobin: 10.8 g/dL — ABNORMAL LOW (ref 13.0–17.0)
Immature Granulocytes: 1 %
Lymphocytes Relative: 9 %
Lymphs Abs: 0.3 10*3/uL — ABNORMAL LOW (ref 0.7–4.0)
MCH: 23.4 pg — ABNORMAL LOW (ref 26.0–34.0)
MCHC: 30.5 g/dL (ref 30.0–36.0)
MCV: 76.8 fL — ABNORMAL LOW (ref 80.0–100.0)
Monocytes Absolute: 0.5 10*3/uL (ref 0.1–1.0)
Monocytes Relative: 14 %
Neutro Abs: 2.5 10*3/uL (ref 1.7–7.7)
Neutrophils Relative %: 74 %
Platelet Count: 249 10*3/uL (ref 150–400)
RBC: 4.61 MIL/uL (ref 4.22–5.81)
RDW: 21.2 % — ABNORMAL HIGH (ref 11.5–15.5)
WBC Count: 3.3 10*3/uL — ABNORMAL LOW (ref 4.0–10.5)
nRBC: 0 % (ref 0.0–0.2)

## 2021-11-26 MED ORDER — SODIUM CHLORIDE 0.9 % IV SOLN: Freq: Once | INTRAVENOUS | Status: AC

## 2021-11-26 MED ORDER — SODIUM CHLORIDE 0.9 % IV SOLN
250.0000 mg | Freq: Once | INTRAVENOUS | Status: AC
  Administered 2021-11-26: 250 mg via INTRAVENOUS
  Filled 2021-11-26: qty 25

## 2021-11-26 MED ORDER — PALONOSETRON HCL INJECTION 0.25 MG/5ML
0.2500 mg | Freq: Once | INTRAVENOUS | Status: AC
  Administered 2021-11-26: 0.25 mg via INTRAVENOUS
  Filled 2021-11-26: qty 5

## 2021-11-26 MED ORDER — DIPHENHYDRAMINE HCL 50 MG/ML IJ SOLN
25.0000 mg | Freq: Once | INTRAMUSCULAR | Status: AC
  Administered 2021-11-26: 25 mg via INTRAVENOUS
  Filled 2021-11-26: qty 1

## 2021-11-26 MED ORDER — FAMOTIDINE 20 MG IN NS 100 ML IVPB
20.0000 mg | Freq: Once | INTRAVENOUS | Status: AC
  Administered 2021-11-26: 20 mg via INTRAVENOUS
  Filled 2021-11-26: qty 100

## 2021-11-26 MED ORDER — SODIUM CHLORIDE 0.9 % IV SOLN
10.0000 mg | Freq: Once | INTRAVENOUS | Status: AC
  Administered 2021-11-26: 10 mg via INTRAVENOUS
  Filled 2021-11-26: qty 1

## 2021-11-26 MED ORDER — SODIUM CHLORIDE 0.9 % IV SOLN
50.0000 mg/m2 | Freq: Once | INTRAVENOUS | Status: AC
  Administered 2021-11-26: 120 mg via INTRAVENOUS
  Filled 2021-11-26: qty 20

## 2021-11-26 NOTE — Progress Notes (Signed)
Patient presents for treatment. RN assessment completed along with the following:  Labs/vitals reviewed - Yes, and all are within normal limits.     Weight within 10% of previous measurement - Yes Oncology Treatment Attestation completed for current therapy- Yes, on date 10/28/21 Informed consent completed and reflects current therapy/intent - Yes, on date 11/12/21             Provider progress note reviewed - Today's provider note is not yet available. I reviewed the most recent oncology provider progress note in chart dated 11/19/21. Treatment/Antibody/Supportive plan reviewed - Yes, and there are no adjustments needed for today's treatment. S&H and other orders reviewed - Yes, and there are no additional orders identified. Previous treatment date reviewed - Yes, and the appropriate amount of time has elapsed between treatments.  Patient to proceed with treatment.

## 2021-11-26 NOTE — Patient Instructions (Signed)
[facility]   Discharge Instructions: Thank you for choosing [facility] to provide your oncology and hematology care.   If you have a lab appointment with the [facility], please go directly to the [facility] and check in at the registration area.   Wear comfortable clothing and clothing appropriate for easy access to any Portacath or PICC line.   We strive to give you quality time with your provider. You may need to reschedule your appointment if you arrive late (15 or more minutes).  Arriving late affects you and other patients whose appointments are after yours.  Also, if you miss three or more appointments without notifying the office, you may be dismissed from the clinic at the providers discretion.      For prescription refill requests, have your pharmacy contact our office and allow 72 hours for refills to be completed.    Today you received the following chemotherapy and/or immunotherapy agents Taxol, Carboplatin      To help prevent nausea and vomiting after your treatment, we encourage you to take your nausea medication as directed.  BELOW ARE SYMPTOMS THAT SHOULD BE REPORTED IMMEDIATELY: *FEVER GREATER THAN 100.4 F (38 C) OR HIGHER *CHILLS OR SWEATING *NAUSEA AND VOMITING THAT IS NOT CONTROLLED WITH YOUR NAUSEA MEDICATION *UNUSUAL SHORTNESS OF BREATH *UNUSUAL BRUISING OR BLEEDING *URINARY PROBLEMS (pain or burning when urinating, or frequent urination) *BOWEL PROBLEMS (unusual diarrhea, constipation, pain near the anus) TENDERNESS IN MOUTH AND THROAT WITH OR WITHOUT PRESENCE OF ULCERS (sore throat, sores in mouth, or a toothache) UNUSUAL RASH, SWELLING OR PAIN  UNUSUAL VAGINAL DISCHARGE OR ITCHING   Items with * indicate a potential emergency and should be followed up as soon as possible or go to the Emergency Department if any problems should occur.  Please show the CHEMOTHERAPY ALERT CARD or IMMUNOTHERAPY ALERT CARD at  check-in to the Emergency Department and triage nurse.  Should you have questions after your visit or need to cancel or reschedule your appointment, please contact [facility]  Dept: [phone]  and follow the prompts.  Office hours are 8:00 a.m. to 4:30 p.m. Monday - Friday. Please note that voicemails left after 4:00 p.m. may not be returned until the following business day.  We are closed weekends and major holidays. You have access to a nurse at all times for urgent questions. Please call the main number to the clinic Dept: [phone] and follow the prompts.   For any non-urgent questions, you may also contact your provider using MyChart. We now offer e-Visits for anyone 78 and older to request care online for non-urgent symptoms. For details visit [email or web address].   Also download the MyChart app! Go to the app store, search "MyChart", open the app, select [facility], and log in with your MyChart username and password.  Due to Covid, a mask is required upon entering the hospital/clinic. If you do not have a mask, one will be given to you upon arrival. For doctor visits, patients may have 1 support person aged 32 or older with them. For treatment visits, patients cannot have anyone with them due to current Covid guidelines and our immunocompromised population.   Paclitaxel injection What is this medication? PACLITAXEL (PAK li TAX el) is a chemotherapy drug. It targets fast dividing cells, like cancer cells, and causes these cells to die. This medicine is used to treat ovarian cancer, breast cancer, lung cancer, Kaposi's sarcoma, and other cancers. This  medicine may be used for other purposes; ask your health care provider or pharmacist if you have questions. COMMON BRAND NAME(S): Onxol, Taxol What should I tell my care team before I take this medication? They need to know if you have any of these conditions: history of irregular heartbeat liver  disease low blood counts, like low white cell, platelet, or red cell counts lung or breathing disease, like asthma tingling of the fingers or toes, or other nerve disorder an unusual or allergic reaction to paclitaxel, alcohol, polyoxyethylated castor oil, other chemotherapy, other medicines, foods, dyes, or preservatives pregnant or trying to get pregnant breast-feeding How should I use this medication? This drug is given as an infusion into a vein. It is administered in a hospital or clinic by a specially trained health care professional. Talk to your pediatrician regarding the use of this medicine in children. Special care may be needed. Overdosage: If you think you have taken too much of this medicine contact a poison control center or emergency room at once. NOTE: This medicine is only for you. Do not share this medicine with others. What if I miss a dose? It is important not to miss your dose. Call your doctor or health care professional if you are unable to keep an appointment. What may interact with this medication? Do not take this medicine with any of the following medications: live virus vaccines This medicine may also interact with the following medications: antiviral medicines for hepatitis, HIV or AIDS certain antibiotics like erythromycin and clarithromycin certain medicines for fungal infections like ketoconazole and itraconazole certain medicines for seizures like carbamazepine, phenobarbital, phenytoin gemfibrozil nefazodone rifampin St. John's wort This list may not describe all possible interactions. Give your health care provider a list of all the medicines, herbs, non-prescription drugs, or dietary supplements you use. Also tell them if you smoke, drink alcohol, or use illegal drugs. Some items may interact with your medicine. What should I watch for while using this medication? Your condition will be monitored carefully while you are receiving this medicine. You  will need important blood work done while you are taking this medicine. This medicine can cause serious allergic reactions. To reduce your risk you will need to take other medicine(s) before treatment with this medicine. If you experience allergic reactions like skin rash, itching or hives, swelling of the face, lips, or tongue, tell your doctor or health care professional right away. In some cases, you may be given additional medicines to help with side effects. Follow all directions for their use. This drug may make you feel generally unwell. This is not uncommon, as chemotherapy can affect healthy cells as well as cancer cells. Report any side effects. Continue your course of treatment even though you feel ill unless your doctor tells you to stop. Call your doctor or health care professional for advice if you get a fever, chills or sore throat, or other symptoms of a cold or flu. Do not treat yourself. This drug decreases your body's ability to fight infections. Try to avoid being around people who are sick. This medicine may increase your risk to bruise or bleed. Call your doctor or health care professional if you notice any unusual bleeding. Be careful brushing and flossing your teeth or using a toothpick because you may get an infection or bleed more easily. If you have any dental work done, tell your dentist you are receiving this medicine. Avoid taking products that contain aspirin, acetaminophen, ibuprofen, naproxen, or ketoprofen unless instructed  by your doctor. These medicines may hide a fever. Do not become pregnant while taking this medicine. Women should inform their doctor if they wish to become pregnant or think they might be pregnant. There is a potential for serious side effects to an unborn child. Talk to your health care professional or pharmacist for more information. Do not breast-feed an infant while taking this medicine. Men are advised not to father a child while receiving this  medicine. This product may contain alcohol. Ask your pharmacist or healthcare provider if this medicine contains alcohol. Be sure to tell all healthcare providers you are taking this medicine. Certain medicines, like metronidazole and disulfiram, can cause an unpleasant reaction when taken with alcohol. The reaction includes flushing, headache, nausea, vomiting, sweating, and increased thirst. The reaction can last from 30 minutes to several hours. What side effects may I notice from receiving this medication? Side effects that you should report to your doctor or health care professional as soon as possible: allergic reactions like skin rash, itching or hives, swelling of the face, lips, or tongue breathing problems changes in vision fast, irregular heartbeat high or low blood pressure mouth sores pain, tingling, numbness in the hands or feet signs of decreased platelets or bleeding - bruising, pinpoint red spots on the skin, black, tarry stools, blood in the urine signs of decreased red blood cells - unusually weak or tired, feeling faint or lightheaded, falls signs of infection - fever or chills, cough, sore throat, pain or difficulty passing urine signs and symptoms of liver injury like dark yellow or brown urine; general ill feeling or flu-like symptoms; light-colored stools; loss of appetite; nausea; right upper belly pain; unusually weak or tired; yellowing of the eyes or skin swelling of the ankles, feet, hands unusually slow heartbeat Side effects that usually do not require medical attention (report to your doctor or health care professional if they continue or are bothersome): diarrhea hair loss loss of appetite muscle or joint pain nausea, vomiting pain, redness, or irritation at site where injected tiredness This list may not describe all possible side effects. Call your doctor for medical advice about side effects. You may report side effects to FDA at 1-800-FDA-1088. Where  should I keep my medication? This drug is given in a hospital or clinic and will not be stored at home. NOTE: This sheet is a summary. It may not cover all possible information. If you have questions about this medicine, talk to your doctor, pharmacist, or health care provider.  2022 Elsevier/Gold Standard (2021-08-18 00:00:00)  Carboplatin injection What is this medication? CARBOPLATIN (KAR boe pla tin) is a chemotherapy drug. It targets fast dividing cells, like cancer cells, and causes these cells to die. This medicine is used to treat ovarian cancer and many other cancers. This medicine may be used for other purposes; ask your health care provider or pharmacist if you have questions. COMMON BRAND NAME(S): Paraplatin What should I tell my care team before I take this medication? They need to know if you have any of these conditions: blood disorders hearing problems kidney disease recent or ongoing radiation therapy an unusual or allergic reaction to carboplatin, cisplatin, other chemotherapy, other medicines, foods, dyes, or preservatives pregnant or trying to get pregnant breast-feeding How should I use this medication? This drug is usually given as an infusion into a vein. It is administered in a hospital or clinic by a specially trained health care professional. Talk to your pediatrician regarding the use of this medicine  in children. Special care may be needed. Overdosage: If you think you have taken too much of this medicine contact a poison control center or emergency room at once. NOTE: This medicine is only for you. Do not share this medicine with others. What if I miss a dose? It is important not to miss a dose. Call your doctor or health care professional if you are unable to keep an appointment. What may interact with this medication? medicines for seizures medicines to increase blood counts like filgrastim, pegfilgrastim, sargramostim some antibiotics like amikacin,  gentamicin, neomycin, streptomycin, tobramycin vaccines Talk to your doctor or health care professional before taking any of these medicines: acetaminophen aspirin ibuprofen ketoprofen naproxen This list may not describe all possible interactions. Give your health care provider a list of all the medicines, herbs, non-prescription drugs, or dietary supplements you use. Also tell them if you smoke, drink alcohol, or use illegal drugs. Some items may interact with your medicine. What should I watch for while using this medication? Your condition will be monitored carefully while you are receiving this medicine. You will need important blood work done while you are taking this medicine. This drug may make you feel generally unwell. This is not uncommon, as chemotherapy can affect healthy cells as well as cancer cells. Report any side effects. Continue your course of treatment even though you feel ill unless your doctor tells you to stop. In some cases, you may be given additional medicines to help with side effects. Follow all directions for their use. Call your doctor or health care professional for advice if you get a fever, chills or sore throat, or other symptoms of a cold or flu. Do not treat yourself. This drug decreases your body's ability to fight infections. Try to avoid being around people who are sick. This medicine may increase your risk to bruise or bleed. Call your doctor or health care professional if you notice any unusual bleeding. Be careful brushing and flossing your teeth or using a toothpick because you may get an infection or bleed more easily. If you have any dental work done, tell your dentist you are receiving this medicine. Avoid taking products that contain aspirin, acetaminophen, ibuprofen, naproxen, or ketoprofen unless instructed by your doctor. These medicines may hide a fever. Do not become pregnant while taking this medicine. Women should inform their doctor if they wish  to become pregnant or think they might be pregnant. There is a potential for serious side effects to an unborn child. Talk to your health care professional or pharmacist for more information. Do not breast-feed an infant while taking this medicine. What side effects may I notice from receiving this medication? Side effects that you should report to your doctor or health care professional as soon as possible: allergic reactions like skin rash, itching or hives, swelling of the face, lips, or tongue signs of infection - fever or chills, cough, sore throat, pain or difficulty passing urine signs of decreased platelets or bleeding - bruising, pinpoint red spots on the skin, black, tarry stools, nosebleeds signs of decreased red blood cells - unusually weak or tired, fainting spells, lightheadedness breathing problems changes in hearing changes in vision chest pain high blood pressure low blood counts - This drug may decrease the number of white blood cells, red blood cells and platelets. You may be at increased risk for infections and bleeding. nausea and vomiting pain, swelling, redness or irritation at the injection site pain, tingling, numbness in the hands or  feet problems with balance, talking, walking trouble passing urine or change in the amount of urine Side effects that usually do not require medical attention (report to your doctor or health care professional if they continue or are bothersome): hair loss loss of appetite metallic taste in the mouth or changes in taste This list may not describe all possible side effects. Call your doctor for medical advice about side effects. You may report side effects to FDA at 1-800-FDA-1088. Where should I keep my medication? This drug is given in a hospital or clinic and will not be stored at home. NOTE: This sheet is a summary. It may not cover all possible information. If you have questions about this medicine, talk to your doctor, pharmacist,  or health care provider.  2022 Elsevier/Gold Standard (2008-05-08 00:00:00)

## 2021-11-27 ENCOUNTER — Ambulatory Visit
Admission: RE | Admit: 2021-11-27 | Discharge: 2021-11-27 | Disposition: A | Discharge status: Home / Self Care | Payer: Medicare PPO | Source: Ambulatory Visit | Attending: Radiation Oncology | Admitting: Radiation Oncology

## 2021-11-27 ENCOUNTER — Telehealth: Payer: Self-pay | Admitting: *Deleted

## 2021-11-27 ENCOUNTER — Other Ambulatory Visit: Payer: Self-pay

## 2021-11-27 DIAGNOSIS — Z510 Encounter for antineoplastic radiation therapy: Secondary | ICD-10-CM | POA: Diagnosis not present

## 2021-11-27 DIAGNOSIS — D509 Iron deficiency anemia, unspecified: Secondary | ICD-10-CM | POA: Diagnosis not present

## 2021-11-27 DIAGNOSIS — E039 Hypothyroidism, unspecified: Secondary | ICD-10-CM | POA: Diagnosis not present

## 2021-11-27 DIAGNOSIS — C160 Malignant neoplasm of cardia: Secondary | ICD-10-CM | POA: Diagnosis not present

## 2021-11-27 DIAGNOSIS — Z5111 Encounter for antineoplastic chemotherapy: Secondary | ICD-10-CM | POA: Diagnosis not present

## 2021-11-27 DIAGNOSIS — I10 Essential (primary) hypertension: Secondary | ICD-10-CM | POA: Diagnosis not present

## 2021-11-27 DIAGNOSIS — G4730 Sleep apnea, unspecified: Secondary | ICD-10-CM | POA: Diagnosis not present

## 2021-11-27 DIAGNOSIS — E119 Type 2 diabetes mellitus without complications: Secondary | ICD-10-CM | POA: Diagnosis not present

## 2021-11-27 DIAGNOSIS — I2510 Atherosclerotic heart disease of native coronary artery without angina pectoris: Secondary | ICD-10-CM | POA: Diagnosis not present

## 2021-11-27 NOTE — Telephone Encounter (Signed)
Reached out to the patient to get information about his sleep apnea. Patient states he is not ready to do the sleep study at this time because he is going through chemotherapy right now and he will contact us in February to possibly schedule then.

## 2021-11-27 NOTE — Telephone Encounter (Signed)
-----   Message from Philbert Riser sent at 11/20/2021  3:03 PM EST ----- Regarding: FW: REFERRAL See below.  Thanks ----- Message ----- From: Lauralee Evener, CMA Sent: 10/15/2021   2:34 PM EST To: Philbert Riser Subject: RE: REFERRAL                                   Dr Audie Box just wants him to be seen by Dr Radford Pax for sleep. Apparently he has untreated OSA. ----- Message ----- From: Philbert Riser Sent: 10/13/2021   1:56 PM EDT To: [facility] Subject: REFERRAL                                       Sleep study.  Thanks

## 2021-11-29 ENCOUNTER — Other Ambulatory Visit: Payer: Self-pay | Admitting: Oncology

## 2021-11-30 ENCOUNTER — Ambulatory Visit
Admission: RE | Admit: 2021-11-30 | Discharge: 2021-11-30 | Disposition: A | Discharge status: Home / Self Care | Payer: Medicare PPO | Source: Ambulatory Visit | Attending: Radiation Oncology | Admitting: Radiation Oncology

## 2021-11-30 ENCOUNTER — Other Ambulatory Visit: Payer: Self-pay

## 2021-11-30 DIAGNOSIS — E039 Hypothyroidism, unspecified: Secondary | ICD-10-CM | POA: Diagnosis not present

## 2021-11-30 DIAGNOSIS — I10 Essential (primary) hypertension: Secondary | ICD-10-CM | POA: Diagnosis not present

## 2021-11-30 DIAGNOSIS — D509 Iron deficiency anemia, unspecified: Secondary | ICD-10-CM | POA: Diagnosis not present

## 2021-11-30 DIAGNOSIS — I2510 Atherosclerotic heart disease of native coronary artery without angina pectoris: Secondary | ICD-10-CM | POA: Diagnosis not present

## 2021-11-30 DIAGNOSIS — E119 Type 2 diabetes mellitus without complications: Secondary | ICD-10-CM | POA: Diagnosis not present

## 2021-11-30 DIAGNOSIS — Z5111 Encounter for antineoplastic chemotherapy: Secondary | ICD-10-CM | POA: Diagnosis not present

## 2021-11-30 DIAGNOSIS — C160 Malignant neoplasm of cardia: Secondary | ICD-10-CM | POA: Diagnosis not present

## 2021-11-30 DIAGNOSIS — Z510 Encounter for antineoplastic radiation therapy: Secondary | ICD-10-CM | POA: Diagnosis not present

## 2021-11-30 DIAGNOSIS — G4730 Sleep apnea, unspecified: Secondary | ICD-10-CM | POA: Diagnosis not present

## 2021-12-01 ENCOUNTER — Ambulatory Visit
Admission: RE | Admit: 2021-12-01 | Discharge: 2021-12-01 | Disposition: A | Discharge status: Home / Self Care | Payer: Medicare PPO | Source: Ambulatory Visit | Attending: Radiation Oncology | Admitting: Radiation Oncology

## 2021-12-01 DIAGNOSIS — E119 Type 2 diabetes mellitus without complications: Secondary | ICD-10-CM | POA: Diagnosis not present

## 2021-12-01 DIAGNOSIS — D509 Iron deficiency anemia, unspecified: Secondary | ICD-10-CM | POA: Diagnosis not present

## 2021-12-01 DIAGNOSIS — G4730 Sleep apnea, unspecified: Secondary | ICD-10-CM | POA: Diagnosis not present

## 2021-12-01 DIAGNOSIS — I2510 Atherosclerotic heart disease of native coronary artery without angina pectoris: Secondary | ICD-10-CM | POA: Diagnosis not present

## 2021-12-01 DIAGNOSIS — Z5111 Encounter for antineoplastic chemotherapy: Secondary | ICD-10-CM | POA: Diagnosis not present

## 2021-12-01 DIAGNOSIS — E039 Hypothyroidism, unspecified: Secondary | ICD-10-CM | POA: Diagnosis not present

## 2021-12-01 DIAGNOSIS — Z510 Encounter for antineoplastic radiation therapy: Secondary | ICD-10-CM | POA: Diagnosis not present

## 2021-12-01 DIAGNOSIS — I10 Essential (primary) hypertension: Secondary | ICD-10-CM | POA: Diagnosis not present

## 2021-12-01 DIAGNOSIS — C160 Malignant neoplasm of cardia: Secondary | ICD-10-CM | POA: Diagnosis not present

## 2021-12-02 ENCOUNTER — Other Ambulatory Visit: Payer: Self-pay

## 2021-12-02 ENCOUNTER — Other Ambulatory Visit: Payer: Self-pay | Admitting: *Deleted

## 2021-12-02 ENCOUNTER — Ambulatory Visit
Admission: RE | Admit: 2021-12-02 | Discharge: 2021-12-02 | Disposition: A | Discharge status: Home / Self Care | Payer: Medicare PPO | Source: Ambulatory Visit | Attending: Radiation Oncology | Admitting: Radiation Oncology

## 2021-12-02 DIAGNOSIS — C160 Malignant neoplasm of cardia: Secondary | ICD-10-CM | POA: Diagnosis not present

## 2021-12-02 DIAGNOSIS — I2510 Atherosclerotic heart disease of native coronary artery without angina pectoris: Secondary | ICD-10-CM | POA: Diagnosis not present

## 2021-12-02 DIAGNOSIS — E119 Type 2 diabetes mellitus without complications: Secondary | ICD-10-CM | POA: Diagnosis not present

## 2021-12-02 DIAGNOSIS — Z5111 Encounter for antineoplastic chemotherapy: Secondary | ICD-10-CM | POA: Diagnosis not present

## 2021-12-02 DIAGNOSIS — G4730 Sleep apnea, unspecified: Secondary | ICD-10-CM | POA: Diagnosis not present

## 2021-12-02 DIAGNOSIS — E039 Hypothyroidism, unspecified: Secondary | ICD-10-CM | POA: Diagnosis not present

## 2021-12-02 DIAGNOSIS — D509 Iron deficiency anemia, unspecified: Secondary | ICD-10-CM | POA: Diagnosis not present

## 2021-12-02 DIAGNOSIS — I10 Essential (primary) hypertension: Secondary | ICD-10-CM | POA: Diagnosis not present

## 2021-12-02 DIAGNOSIS — Z510 Encounter for antineoplastic radiation therapy: Secondary | ICD-10-CM | POA: Diagnosis not present

## 2021-12-03 ENCOUNTER — Inpatient Hospital Stay: Payer: Medicare PPO

## 2021-12-03 ENCOUNTER — Ambulatory Visit
Admission: RE | Admit: 2021-12-03 | Discharge: 2021-12-03 | Disposition: A | Discharge status: Home / Self Care | Payer: Medicare PPO | Source: Ambulatory Visit | Attending: Radiation Oncology | Admitting: Radiation Oncology

## 2021-12-03 ENCOUNTER — Telehealth: Payer: Self-pay

## 2021-12-03 ENCOUNTER — Inpatient Hospital Stay: Payer: Medicare PPO | Admitting: Oncology

## 2021-12-03 ENCOUNTER — Other Ambulatory Visit: Payer: Self-pay

## 2021-12-03 VITALS — BP 140/78 | HR 90 | Temp 98.1°F | Resp 20 | Ht 72.0 in | Wt 228.0 lb

## 2021-12-03 VITALS — BP 122/74 | HR 79 | Temp 98.2°F | Resp 20

## 2021-12-03 DIAGNOSIS — Z510 Encounter for antineoplastic radiation therapy: Secondary | ICD-10-CM | POA: Diagnosis not present

## 2021-12-03 DIAGNOSIS — D509 Iron deficiency anemia, unspecified: Secondary | ICD-10-CM | POA: Diagnosis not present

## 2021-12-03 DIAGNOSIS — I2510 Atherosclerotic heart disease of native coronary artery without angina pectoris: Secondary | ICD-10-CM | POA: Diagnosis not present

## 2021-12-03 DIAGNOSIS — I10 Essential (primary) hypertension: Secondary | ICD-10-CM | POA: Diagnosis not present

## 2021-12-03 DIAGNOSIS — E119 Type 2 diabetes mellitus without complications: Secondary | ICD-10-CM | POA: Diagnosis not present

## 2021-12-03 DIAGNOSIS — C160 Malignant neoplasm of cardia: Secondary | ICD-10-CM

## 2021-12-03 DIAGNOSIS — G4730 Sleep apnea, unspecified: Secondary | ICD-10-CM | POA: Diagnosis not present

## 2021-12-03 DIAGNOSIS — Z5111 Encounter for antineoplastic chemotherapy: Secondary | ICD-10-CM | POA: Diagnosis not present

## 2021-12-03 DIAGNOSIS — E039 Hypothyroidism, unspecified: Secondary | ICD-10-CM | POA: Diagnosis not present

## 2021-12-03 LAB — CBC WITH DIFFERENTIAL (CANCER CENTER ONLY)
Abs Immature Granulocytes: 0.01 10*3/uL (ref 0.00–0.07)
Basophils Absolute: 0 10*3/uL (ref 0.0–0.1)
Basophils Relative: 1 %
Eosinophils Absolute: 0.1 10*3/uL (ref 0.0–0.5)
Eosinophils Relative: 4 %
HCT: 35.4 % — ABNORMAL LOW (ref 39.0–52.0)
Hemoglobin: 10.8 g/dL — ABNORMAL LOW (ref 13.0–17.0)
Immature Granulocytes: 0 %
Lymphocytes Relative: 7 %
Lymphs Abs: 0.2 10*3/uL — ABNORMAL LOW (ref 0.7–4.0)
MCH: 23.6 pg — ABNORMAL LOW (ref 26.0–34.0)
MCHC: 30.5 g/dL (ref 30.0–36.0)
MCV: 77.3 fL — ABNORMAL LOW (ref 80.0–100.0)
Monocytes Absolute: 0.5 10*3/uL (ref 0.1–1.0)
Monocytes Relative: 21 %
Neutro Abs: 1.5 10*3/uL — ABNORMAL LOW (ref 1.7–7.7)
Neutrophils Relative %: 67 %
Platelet Count: 247 10*3/uL (ref 150–400)
RBC: 4.58 MIL/uL (ref 4.22–5.81)
RDW: 21.9 % — ABNORMAL HIGH (ref 11.5–15.5)
WBC Count: 2.3 10*3/uL — ABNORMAL LOW (ref 4.0–10.5)
nRBC: 0 % (ref 0.0–0.2)

## 2021-12-03 LAB — CMP (CANCER CENTER ONLY)
ALT: 10 U/L (ref 0–44)
AST: 9 U/L — ABNORMAL LOW (ref 15–41)
Albumin: 4.1 g/dL (ref 3.5–5.0)
Alkaline Phosphatase: 71 U/L (ref 38–126)
Anion gap: 7 (ref 5–15)
BUN: 12 mg/dL (ref 8–23)
CO2: 26 mmol/L (ref 22–32)
Calcium: 9.2 mg/dL (ref 8.9–10.3)
Chloride: 105 mmol/L (ref 98–111)
Creatinine: 0.9 mg/dL (ref 0.61–1.24)
GFR, Estimated: >60 mL/min (ref >60)
Glucose, Bld: 181 mg/dL — ABNORMAL HIGH (ref 70–99)
Potassium: 3.4 mmol/L — ABNORMAL LOW (ref 3.5–5.1)
Sodium: 138 mmol/L (ref 135–145)
Total Bilirubin: 0.4 mg/dL (ref 0.3–1.2)
Total Protein: 6.8 g/dL (ref 6.5–8.1)

## 2021-12-03 MED ORDER — SODIUM CHLORIDE 0.9 % IV SOLN
250.0000 mg | Freq: Once | INTRAVENOUS | Status: AC
  Administered 2021-12-03: 12:00:00 250 mg via INTRAVENOUS
  Filled 2021-12-03: qty 25

## 2021-12-03 MED ORDER — PALONOSETRON HCL INJECTION 0.25 MG/5ML
0.2500 mg | Freq: Once | INTRAVENOUS | Status: AC
  Administered 2021-12-03: 10:00:00 0.25 mg via INTRAVENOUS
  Filled 2021-12-03: qty 5

## 2021-12-03 MED ORDER — SODIUM CHLORIDE 0.9 % IV SOLN
50.0000 mg/m2 | Freq: Once | INTRAVENOUS | Status: AC
  Administered 2021-12-03: 11:00:00 120 mg via INTRAVENOUS
  Filled 2021-12-03: qty 20

## 2021-12-03 MED ORDER — SODIUM CHLORIDE 0.9 % IV SOLN: Freq: Once | INTRAVENOUS | Status: AC

## 2021-12-03 MED ORDER — SODIUM CHLORIDE 0.9 % IV SOLN
10.0000 mg | Freq: Once | INTRAVENOUS | Status: AC
  Administered 2021-12-03: 11:00:00 10 mg via INTRAVENOUS
  Filled 2021-12-03: qty 1

## 2021-12-03 MED ORDER — FAMOTIDINE 20 MG IN NS 100 ML IVPB
20.0000 mg | Freq: Once | INTRAVENOUS | Status: AC
  Administered 2021-12-03: 10:00:00 20 mg via INTRAVENOUS
  Filled 2021-12-03: qty 100

## 2021-12-03 MED ORDER — DIPHENHYDRAMINE HCL 50 MG/ML IJ SOLN
25.0000 mg | Freq: Once | INTRAMUSCULAR | Status: AC
  Administered 2021-12-03: 10:00:00 25 mg via INTRAVENOUS
  Filled 2021-12-03: qty 1

## 2021-12-03 NOTE — Patient Instructions (Signed)
[place]   Discharge Instructions: Thank you for choosing [facility] to provide your oncology and hematology care.   If you have a lab appointment with the [facility], please go directly to the [facility] and check in at the registration area.   Wear comfortable clothing and clothing appropriate for easy access to any Portacath or PICC line.   We strive to give you quality time with your provider. You may need to reschedule your appointment if you arrive late (15 or more minutes).  Arriving late affects you and other patients whose appointments are after yours.  Also, if you miss three or more appointments without notifying the office, you may be dismissed from the clinic at the providers discretion.      For prescription refill requests, have your pharmacy contact our office and allow 72 hours for refills to be completed.    Today you received the following chemotherapy and/or immunotherapy agents Taxol, Carboplatin      To help prevent nausea and vomiting after your treatment, we encourage you to take your nausea medication as directed.  BELOW ARE SYMPTOMS THAT SHOULD BE REPORTED IMMEDIATELY: *FEVER GREATER THAN 100.4 F (38 C) OR HIGHER *CHILLS OR SWEATING *NAUSEA AND VOMITING THAT IS NOT CONTROLLED WITH YOUR NAUSEA MEDICATION *UNUSUAL SHORTNESS OF BREATH *UNUSUAL BRUISING OR BLEEDING *URINARY PROBLEMS (pain or burning when urinating, or frequent urination) *BOWEL PROBLEMS (unusual diarrhea, constipation, pain near the anus) TENDERNESS IN MOUTH AND THROAT WITH OR WITHOUT PRESENCE OF ULCERS (sore throat, sores in mouth, or a toothache) UNUSUAL RASH, SWELLING OR PAIN  UNUSUAL VAGINAL DISCHARGE OR ITCHING   Items with * indicate a potential emergency and should be followed up as soon as possible or go to the Emergency Department if any problems should occur.  Please show the CHEMOTHERAPY ALERT CARD or IMMUNOTHERAPY ALERT CARD at  check-in to the Emergency Department and triage nurse.  Should you have questions after your visit or need to cancel or reschedule your appointment, please contact [facility]  Dept: [phone]  and follow the prompts.  Office hours are 8:00 a.m. to 4:30 p.m. Monday - Friday. Please note that voicemails left after 4:00 p.m. may not be returned until the following business day.  We are closed weekends and major holidays. You have access to a nurse at all times for urgent questions. Please call the main number to the clinic Dept: [phone] and follow the prompts.   For any non-urgent questions, you may also contact your provider using MyChart. We now offer e-Visits for anyone 1 and older to request care online for non-urgent symptoms. For details visit [email or web address].   Also download the MyChart app! Go to the app store, search "MyChart", open the app, select [facility], and log in with your MyChart username and password.  Due to Covid, a mask is required upon entering the hospital/clinic. If you do not have a mask, one will be given to you upon arrival. For doctor visits, patients may have 1 support person aged 81 or older with them. For treatment visits, patients cannot have anyone with them due to current Covid guidelines and our immunocompromised population.   Paclitaxel injection What is this medication? PACLITAXEL (PAK li TAX el) is a chemotherapy drug. It targets fast dividing cells, like cancer cells, and causes these cells to die. This medicine is used to treat ovarian cancer, breast cancer, lung cancer, Kaposi's sarcoma, and other cancers. This  medicine may be used for other purposes; ask your health care provider or pharmacist if you have questions. COMMON BRAND NAME(S): Onxol, Taxol What should I tell my care team before I take this medication? They need to know if you have any of these conditions: history of irregular heartbeat liver  disease low blood counts, like low white cell, platelet, or red cell counts lung or breathing disease, like asthma tingling of the fingers or toes, or other nerve disorder an unusual or allergic reaction to paclitaxel, alcohol, polyoxyethylated castor oil, other chemotherapy, other medicines, foods, dyes, or preservatives pregnant or trying to get pregnant breast-feeding How should I use this medication? This drug is given as an infusion into a vein. It is administered in a hospital or clinic by a specially trained health care professional. Talk to your pediatrician regarding the use of this medicine in children. Special care may be needed. Overdosage: If you think you have taken too much of this medicine contact a poison control center or emergency room at once. NOTE: This medicine is only for you. Do not share this medicine with others. What if I miss a dose? It is important not to miss your dose. Call your doctor or health care professional if you are unable to keep an appointment. What may interact with this medication? Do not take this medicine with any of the following medications: live virus vaccines This medicine may also interact with the following medications: antiviral medicines for hepatitis, HIV or AIDS certain antibiotics like erythromycin and clarithromycin certain medicines for fungal infections like ketoconazole and itraconazole certain medicines for seizures like carbamazepine, phenobarbital, phenytoin gemfibrozil nefazodone rifampin St. John's wort This list may not describe all possible interactions. Give your health care provider a list of all the medicines, herbs, non-prescription drugs, or dietary supplements you use. Also tell them if you smoke, drink alcohol, or use illegal drugs. Some items may interact with your medicine. What should I watch for while using this medication? Your condition will be monitored carefully while you are receiving this medicine. You  will need important blood work done while you are taking this medicine. This medicine can cause serious allergic reactions. To reduce your risk you will need to take other medicine(s) before treatment with this medicine. If you experience allergic reactions like skin rash, itching or hives, swelling of the face, lips, or tongue, tell your doctor or health care professional right away. In some cases, you may be given additional medicines to help with side effects. Follow all directions for their use. This drug may make you feel generally unwell. This is not uncommon, as chemotherapy can affect healthy cells as well as cancer cells. Report any side effects. Continue your course of treatment even though you feel ill unless your doctor tells you to stop. Call your doctor or health care professional for advice if you get a fever, chills or sore throat, or other symptoms of a cold or flu. Do not treat yourself. This drug decreases your body's ability to fight infections. Try to avoid being around people who are sick. This medicine may increase your risk to bruise or bleed. Call your doctor or health care professional if you notice any unusual bleeding. Be careful brushing and flossing your teeth or using a toothpick because you may get an infection or bleed more easily. If you have any dental work done, tell your dentist you are receiving this medicine. Avoid taking products that contain aspirin, acetaminophen, ibuprofen, naproxen, or ketoprofen unless instructed  by your doctor. These medicines may hide a fever. Do not become pregnant while taking this medicine. Women should inform their doctor if they wish to become pregnant or think they might be pregnant. There is a potential for serious side effects to an unborn child. Talk to your health care professional or pharmacist for more information. Do not breast-feed an infant while taking this medicine. Men are advised not to father a child while receiving this  medicine. This product may contain alcohol. Ask your pharmacist or healthcare provider if this medicine contains alcohol. Be sure to tell all healthcare providers you are taking this medicine. Certain medicines, like metronidazole and disulfiram, can cause an unpleasant reaction when taken with alcohol. The reaction includes flushing, headache, nausea, vomiting, sweating, and increased thirst. The reaction can last from 30 minutes to several hours. What side effects may I notice from receiving this medication? Side effects that you should report to your doctor or health care professional as soon as possible: allergic reactions like skin rash, itching or hives, swelling of the face, lips, or tongue breathing problems changes in vision fast, irregular heartbeat high or low blood pressure mouth sores pain, tingling, numbness in the hands or feet signs of decreased platelets or bleeding - bruising, pinpoint red spots on the skin, black, tarry stools, blood in the urine signs of decreased red blood cells - unusually weak or tired, feeling faint or lightheaded, falls signs of infection - fever or chills, cough, sore throat, pain or difficulty passing urine signs and symptoms of liver injury like dark yellow or brown urine; general ill feeling or flu-like symptoms; light-colored stools; loss of appetite; nausea; right upper belly pain; unusually weak or tired; yellowing of the eyes or skin swelling of the ankles, feet, hands unusually slow heartbeat Side effects that usually do not require medical attention (report to your doctor or health care professional if they continue or are bothersome): diarrhea hair loss loss of appetite muscle or joint pain nausea, vomiting pain, redness, or irritation at site where injected tiredness This list may not describe all possible side effects. Call your doctor for medical advice about side effects. You may report side effects to FDA at 1-800-FDA-1088. Where  should I keep my medication? This drug is given in a hospital or clinic and will not be stored at home. NOTE: This sheet is a summary. It may not cover all possible information. If you have questions about this medicine, talk to your doctor, pharmacist, or health care provider.  2022 Elsevier/Gold Standard (2021-08-18 00:00:00)  Carboplatin injection What is this medication? CARBOPLATIN (KAR boe pla tin) is a chemotherapy drug. It targets fast dividing cells, like cancer cells, and causes these cells to die. This medicine is used to treat ovarian cancer and many other cancers. This medicine may be used for other purposes; ask your health care provider or pharmacist if you have questions. COMMON BRAND NAME(S): Paraplatin What should I tell my care team before I take this medication? They need to know if you have any of these conditions: blood disorders hearing problems kidney disease recent or ongoing radiation therapy an unusual or allergic reaction to carboplatin, cisplatin, other chemotherapy, other medicines, foods, dyes, or preservatives pregnant or trying to get pregnant breast-feeding How should I use this medication? This drug is usually given as an infusion into a vein. It is administered in a hospital or clinic by a specially trained health care professional. Talk to your pediatrician regarding the use of this medicine  in children. Special care may be needed. Overdosage: If you think you have taken too much of this medicine contact a poison control center or emergency room at once. NOTE: This medicine is only for you. Do not share this medicine with others. What if I miss a dose? It is important not to miss a dose. Call your doctor or health care professional if you are unable to keep an appointment. What may interact with this medication? medicines for seizures medicines to increase blood counts like filgrastim, pegfilgrastim, sargramostim some antibiotics like amikacin,  gentamicin, neomycin, streptomycin, tobramycin vaccines Talk to your doctor or health care professional before taking any of these medicines: acetaminophen aspirin ibuprofen ketoprofen naproxen This list may not describe all possible interactions. Give your health care provider a list of all the medicines, herbs, non-prescription drugs, or dietary supplements you use. Also tell them if you smoke, drink alcohol, or use illegal drugs. Some items may interact with your medicine. What should I watch for while using this medication? Your condition will be monitored carefully while you are receiving this medicine. You will need important blood work done while you are taking this medicine. This drug may make you feel generally unwell. This is not uncommon, as chemotherapy can affect healthy cells as well as cancer cells. Report any side effects. Continue your course of treatment even though you feel ill unless your doctor tells you to stop. In some cases, you may be given additional medicines to help with side effects. Follow all directions for their use. Call your doctor or health care professional for advice if you get a fever, chills or sore throat, or other symptoms of a cold or flu. Do not treat yourself. This drug decreases your body's ability to fight infections. Try to avoid being around people who are sick. This medicine may increase your risk to bruise or bleed. Call your doctor or health care professional if you notice any unusual bleeding. Be careful brushing and flossing your teeth or using a toothpick because you may get an infection or bleed more easily. If you have any dental work done, tell your dentist you are receiving this medicine. Avoid taking products that contain aspirin, acetaminophen, ibuprofen, naproxen, or ketoprofen unless instructed by your doctor. These medicines may hide a fever. Do not become pregnant while taking this medicine. Women should inform their doctor if they wish  to become pregnant or think they might be pregnant. There is a potential for serious side effects to an unborn child. Talk to your health care professional or pharmacist for more information. Do not breast-feed an infant while taking this medicine. What side effects may I notice from receiving this medication? Side effects that you should report to your doctor or health care professional as soon as possible: allergic reactions like skin rash, itching or hives, swelling of the face, lips, or tongue signs of infection - fever or chills, cough, sore throat, pain or difficulty passing urine signs of decreased platelets or bleeding - bruising, pinpoint red spots on the skin, black, tarry stools, nosebleeds signs of decreased red blood cells - unusually weak or tired, fainting spells, lightheadedness breathing problems changes in hearing changes in vision chest pain high blood pressure low blood counts - This drug may decrease the number of white blood cells, red blood cells and platelets. You may be at increased risk for infections and bleeding. nausea and vomiting pain, swelling, redness or irritation at the injection site pain, tingling, numbness in the hands or  feet problems with balance, talking, walking trouble passing urine or change in the amount of urine Side effects that usually do not require medical attention (report to your doctor or health care professional if they continue or are bothersome): hair loss loss of appetite metallic taste in the mouth or changes in taste This list may not describe all possible side effects. Call your doctor for medical advice about side effects. You may report side effects to FDA at 1-800-FDA-1088. Where should I keep my medication? This drug is given in a hospital or clinic and will not be stored at home. NOTE: This sheet is a summary. It may not cover all possible information. If you have questions about this medicine, talk to your doctor, pharmacist,  or health care provider.  2022 Elsevier/Gold Standard (2008-05-08 00:00:00)

## 2021-12-03 NOTE — Progress Notes (Signed)
Patient presents for treatment. RN assessment completed along with the following:  Labs/vitals reviewed - Yes, and within treatment parameters.   Weight within 10% of previous measurement - Yes Oncology Treatment Attestation completed for current therapy- Yes, on date 10/28/21 Informed consent completed and reflects current therapy/intent - Yes, on date 11/12/21             Provider progress note reviewed - Today's provider note is not yet available. I reviewed the most recent oncology provider progress note in chart dated 11/19/21. Treatment/Antibody/Supportive plan reviewed - Yes, and there are no adjustments needed for today's treatment. S&H and other orders reviewed - Yes, and there are no additional orders identified. Previous treatment date reviewed - Yes, and the appropriate amount of time has elapsed between treatments. Clinic Hand Off Received from - Danae Orleans, LPN  Patient to proceed with treatment.

## 2021-12-03 NOTE — Telephone Encounter (Signed)
Patient seen by Dr. Sherrill today ? ?Vitals are within treatment parameters. ? ?Labs reviewed by Dr. Sherrill and are within treatment parameters. ? ?Per physician team, patient is ready for treatment and there are NO modifications to the treatment plan.  ?

## 2021-12-03 NOTE — Progress Notes (Signed)
[address]       [place] [address]             [phone]                    Grant Boutin [facility] Medical Record #[id number] Date of Birth: [birth year]  Referring: [name], MD Primary Care: Shirline Frees, MD Primary Cardiologist: Evalina Field, MD  Chief Complaint:    Chief Complaint  Patient presents with   Consult    Initial surgical consult, Upper endo 10/28    History of Present Illness:    Zachary Bowers 65 y.o. male referred by Dr. Antony Haste for surgical evaluation GE junction primary gastric cancer.  He is currently being treated with his neoadjuvant treatment..  We have been asked to assist with the esophageal anastomosis given the location of the tumor.  He underwent PCI to an RCA lesion in August of this year.  He denies any chest pain.  His weight is essentially been stable.    Smoking Hx: Quit smoking in his early 68s   Zubrod Score: At the time of surgery this patients most appropriate activity status/level should be described as: [x]     0    Normal activity, no symptoms []     1    Restricted in physical strenuous activity but ambulatory, able to do out light work []     2    Ambulatory and capable of self care, unable to do work activities, up and about               >50 % of waking hours                              []     3    Only limited self care, in bed greater than 50% of waking hours []     4    Completely disabled, no self care, confined to bed or chair []     5    Moribund   Past Medical History:  Diagnosis Date   Coronary artery disease    Diabetes mellitus without complication ([place])    Hyperlipidemia    Hypertension    Hypogonadism in male    OSA (obstructive sleep apnea)    Stomach cancer ([place])    Thyroid disease     Past Surgical History:  Procedure Laterality Date   APPENDECTOMY     APPENDECTOMY Right    BIOPSY  10/09/2021   Procedure: BIOPSY;  Surgeon: Wilford Corner, MD;  Location: [facility]  ENDOSCOPY;  Service: Endoscopy;;   CARDIAC CATHETERIZATION     CORONARY STENT INTERVENTION N/A 08/11/2021   Procedure: CORONARY STENT INTERVENTION;  Surgeon: Jettie Booze, MD;  Location: [facility];  Service: Cardiovascular;  Laterality: N/A;   ESOPHAGOGASTRODUODENOSCOPY (EGD) WITH PROPOFOL N/A 10/09/2021   Procedure: ESOPHAGOGASTRODUODENOSCOPY (EGD) WITH PROPOFOL;  Surgeon: Wilford Corner, MD;  Location: [facility];  Service: Endoscopy;  Laterality: N/A;   INTRAVASCULAR ULTRASOUND/IVUS N/A 08/11/2021   Procedure: Intravascular Ultrasound/IVUS;  Surgeon: Jettie Booze, MD;  Location: [facility];  Service: Cardiovascular;  Laterality: N/A;   LEFT HEART CATH AND CORONARY ANGIOGRAPHY N/A 08/11/2021   Procedure: LEFT HEART CATH AND CORONARY ANGIOGRAPHY;  Surgeon: Jettie Booze, MD;  Location: [facility];  Service: Cardiovascular;  Laterality: N/A;    Family History  Problem Relation Age of  Onset   Heart disease Mother    Alzheimer's disease Father    Stomach cancer Maternal Uncle      Social History   Tobacco Use  Smoking Status Former  Smokeless Tobacco Former   Types: Chew    Social History   Substance and Sexual Activity  Alcohol Use No     Allergies  Allergen Reactions   Niaspan [Niacin Er] Itching    Current Outpatient Medications  Medication Sig Dispense Refill   amLODipine (NORVASC) 10 MG tablet Take 10 mg by mouth daily.     aspirin 81 MG tablet Take 81 mg by mouth daily.     atorvastatin (LIPITOR) 80 MG tablet Take 1 tablet (80 mg total) by mouth daily. 90 tablet 0   dexamethasone (DECADRON) 2 MG tablet Take 5 tablets (10 mg total) by mouth as directed. Take 5 tablets at 10 pm night before first treatment Take 5 tablets at 6 am morning of first treatment 10 tablet 0   dexamethasone (DECADRON) 2 MG tablet Take 5 tablets (10 mg total) by mouth 2 (two) times daily with a meal. Take 5 tabs at 10 pm night before chemo. And  take 5 tablets morning of chemo 10 tablet 0   ferrous sulfate 325 (65 FE) MG tablet Take 325 mg by mouth 2 (two) times daily.     glimepiride (AMARYL) 4 MG tablet Take 4 mg by mouth every morning.     glucose blood test strip 1 each by Other route. Check blood sugar every morning     levothyroxine (SYNTHROID, LEVOTHROID) 125 MCG tablet Take 125 mcg by mouth daily before breakfast.   0   metFORMIN (GLUCOPHAGE) 500 MG tablet Take 500-1,000 mg by mouth See admin instructions. 1000 mg in the morning 500 mg with dinner as needed if sugar is low     prochlorperazine (COMPAZINE) 10 MG tablet Take 1 tablet (10 mg total) by mouth every 6 (six) hours as needed. 60 tablet 1   sucralfate (CARAFATE) 1 GM/10ML suspension      telmisartan (MICARDIS) 40 MG tablet Take 40 mg by mouth daily.     isosorbide mononitrate (IMDUR) 30 MG 24 hr tablet Take 1 tablet (30 mg total) by mouth daily. 30 tablet 0   nitroGLYCERIN (NITROSTAT) 0.4 MG SL tablet Place 1 tablet (0.4 mg total) under the tongue every 5 (five) minutes as needed for chest pain. (Patient not taking: Reported on 10/29/2021) 25 tablet 5   No current facility-administered medications for this visit.    Review of Systems  Constitutional:  Positive for malaise/fatigue.  Respiratory:  Negative for shortness of breath.   Cardiovascular:  Negative for chest pain.    PHYSICAL EXAMINATION: BP 128/69 (BP Location: Right Arm, Patient Position: Sitting)    Pulse 89    Resp 20    Ht 6' (1.829 m)    Wt 231 lb (104.8 kg)    SpO2 96% Comment: RA   BMI 31.33 kg/m  Physical Exam Constitutional:      General: He is not in acute distress.    Appearance: Normal appearance. He is normal weight. He is not ill-appearing.  HENT:     Head: Normocephalic and atraumatic.  Eyes:     Extraocular Movements: Extraocular movements intact.  Cardiovascular:     Rate and Rhythm: Normal rate.  Pulmonary:     Effort: Pulmonary effort is normal.  Abdominal:     General: There  is no distension.  Musculoskeletal:  General: Normal range of motion.     Cervical back: Normal range of motion.  Skin:    General: Skin is warm and dry.  Neurological:     General: No focal deficit present.     Mental Status: He is alert and oriented to person, place, and time.        I have independently reviewed the above radiology studies  and reviewed the findings with the patient.   Recent Lab Findings: Lab Results  Component Value Date   WBC 2.3 (L) 12/03/2021   HGB 10.8 (L) 12/03/2021   HCT 35.4 (L) 12/03/2021   PLT 247 12/03/2021   GLUCOSE 181 (H) 12/03/2021   ALT 10 12/03/2021   AST 9 (L) 12/03/2021   NA 138 12/03/2021   K 3.4 (L) 12/03/2021   CL 105 12/03/2021   CREATININE 0.90 12/03/2021   BUN 12 12/03/2021   CO2 26 12/03/2021   HGBA1C 6.8 (H) 10/08/2021           Assessment / Plan:   65 year old male with primary gastric cancer involving the cardia of the stomach.  Personally reviewed the PET/CT and the endoscopy.  The gastroesophageal junction appears clear however given its proximity for assistance with esophageal anastomosis.  If we are able to obtain enough length from the belly then we potentially will perform the anastomosis from the laparotomy.  If this is too challenging then we will then perform this robotically from the right chest.  His last dose of radiation will be the second week of January thus the earliest that we would potentially operate on him is in March.  We discussed the importance of adequate protein intake in preparation for the surgery.  I will see him virtually and 1 month for status check.     I  spent 55 minutes with the patient face to face counseling and coordination of care.    Lajuana Matte 12/04/2021 1:35 PM

## 2021-12-03 NOTE — Progress Notes (Signed)
[facility]ach[facility] Bow[facility] OFFICE PROGRESS NOTE   Diagnosis: Gastric cancer  INTERVAL HISTORY:   Zachary Bowers returns as scheduled.  He continues radiation and weekly Taxol/carboplatin.  No nausea/vomiting.  Mild intermittent numbness in the hands.  This does not interfere with activity.  He has intolerance to cold liquids.  He is eating.  He is able to drink chocolate milk.  He is scheduled to see Dr. Kipp Brood tomorrow.  Objective:  Vital signs in last 24 hours:  Blood pressure 140/78, pulse 90, temperature 98.1 F (36.7 C), temperature source Oral, resp. rate 20, height 6' (1.829 m), weight 228 lb (103.4 kg), SpO2 100 %.    HEENT: No thrush or ulcers Resp: Lungs clear bilaterally Cardio: Regular rate and rhythm GI: No hepatosplenomegaly, mild tenderness in the subxiphoid region and left subcostal area, no mass Vascular: No leg edema    Portacath/PICC-without erythema  Lab Results:  Lab Results  Component Value Date   WBC 2.3 (L) 12/03/2021   HGB 10.8 (L) 12/03/2021   HCT 35.4 (L) 12/03/2021   MCV 77.3 (L) 12/03/2021   PLT 247 12/03/2021   NEUTROABS 1.5 (L) 12/03/2021    CMP  Lab Results  Component Value Date   NA 137 11/26/2021   K 3.8 11/26/2021   CL 105 11/26/2021   CO2 25 11/26/2021   GLUCOSE 170 (H) 11/26/2021   BUN 11 11/26/2021   CREATININE 0.82 11/26/2021   CALCIUM 9.3 11/26/2021   PROT 7.2 11/26/2021   ALBUMIN 4.2 11/26/2021   AST 9 (L) 11/26/2021   ALT 11 11/26/2021   ALKPHOS 71 11/26/2021   BILITOT 0.5 11/26/2021   GFRNONAA >60 11/26/2021    Lab Results  Component Value Date   CEA 1.50 10/30/2021   Medications: I have reviewed the patient's current medications.   Assessment/Plan:  Gastric cancer CT abdomen/pelvis 10/08/2021-probable mass in the proximal stomach, enlarged gastropathic ligament node Upper endoscopy 10/01/2021-gastric cardia/fundus mass with oozing and stigmata of recent bleeding-biopsy at least intramucosal  adenocarcinoma, mismatch repair protein expression intact, PD-L1 combined positive score 7%, HER2 positive PET 10/20/2021-hypermetabolic proximal gastric primary, or hypermetabolic gastropathic ligament node, right paramidline prostatic hypermetabolism, anal hypermetabolism without a CT mass Radiation 11/11/2021 Cycle 1  Taxol/carboplatin 11/12/2021 Cycle 2 Taxol/carboplatin 11/19/2021 Cycle 3 Taxol/carboplatin 11/26/2021 Cycle 4 Taxol/carboplatin 12/03/2021 Microcytic anemia secondary to #1 Diabetes Hypertension Coronary artery disease, cardiac catheterization 08/11/2021-placement of proximal and distal right coronary artery stents Hypothyroidism Sleep apnea Anal hypermetabolism on the PET 10/20/2021-no mass identified     Disposition: Zachary Bowers has completed 3 weeks of Taxol/carboplatin.  He has tolerated the chemotherapy well.  His overall performance status remains adequate.  I encouraged him to increase his calorie intake as tolerated.  He will complete cycle 4 Taxol/carboplatin today.  He has mild neutropenia.  He will call for a fever or symptoms of an infection.  He is scheduled for a final treatment with Taxol/carboplatin on 12/10/2021.  Chemotherapy will be delayed until 12/17/2020 if his neutrophil count is lower next week.  He will be scheduled for an office visit on 12/17/2020.  Betsy Coder, MD  12/03/2021  8:56 AM

## 2021-12-04 ENCOUNTER — Ambulatory Visit
Admission: RE | Admit: 2021-12-04 | Discharge: 2021-12-04 | Disposition: A | Discharge status: Home / Self Care | Payer: Medicare PPO | Source: Ambulatory Visit | Attending: Radiation Oncology | Admitting: Radiation Oncology

## 2021-12-04 ENCOUNTER — Institutional Professional Consult (permissible substitution): Payer: Medicare PPO | Admitting: Thoracic Surgery (Cardiothoracic Vascular Surgery)

## 2021-12-04 ENCOUNTER — Other Ambulatory Visit: Payer: Self-pay

## 2021-12-04 VITALS — BP 128/69 | HR 89 | Resp 20 | Ht 72.0 in | Wt 231.0 lb

## 2021-12-04 DIAGNOSIS — I2510 Atherosclerotic heart disease of native coronary artery without angina pectoris: Secondary | ICD-10-CM | POA: Diagnosis not present

## 2021-12-04 DIAGNOSIS — K3189 Other diseases of stomach and duodenum: Secondary | ICD-10-CM | POA: Diagnosis not present

## 2021-12-04 DIAGNOSIS — C160 Malignant neoplasm of cardia: Secondary | ICD-10-CM

## 2021-12-04 DIAGNOSIS — Z5111 Encounter for antineoplastic chemotherapy: Secondary | ICD-10-CM | POA: Diagnosis not present

## 2021-12-04 DIAGNOSIS — Z510 Encounter for antineoplastic radiation therapy: Secondary | ICD-10-CM | POA: Diagnosis not present

## 2021-12-04 DIAGNOSIS — G4730 Sleep apnea, unspecified: Secondary | ICD-10-CM | POA: Diagnosis not present

## 2021-12-04 DIAGNOSIS — I10 Essential (primary) hypertension: Secondary | ICD-10-CM | POA: Diagnosis not present

## 2021-12-04 DIAGNOSIS — D509 Iron deficiency anemia, unspecified: Secondary | ICD-10-CM | POA: Diagnosis not present

## 2021-12-04 DIAGNOSIS — E119 Type 2 diabetes mellitus without complications: Secondary | ICD-10-CM | POA: Diagnosis not present

## 2021-12-04 DIAGNOSIS — E039 Hypothyroidism, unspecified: Secondary | ICD-10-CM | POA: Diagnosis not present

## 2021-12-07 ENCOUNTER — Other Ambulatory Visit: Payer: Self-pay | Admitting: Oncology

## 2021-12-08 ENCOUNTER — Other Ambulatory Visit: Payer: Self-pay

## 2021-12-08 ENCOUNTER — Ambulatory Visit
Admission: RE | Admit: 2021-12-08 | Discharge: 2021-12-08 | Disposition: A | Discharge status: Home / Self Care | Payer: Medicare PPO | Source: Ambulatory Visit | Attending: Radiation Oncology | Admitting: Radiation Oncology

## 2021-12-08 DIAGNOSIS — Z5111 Encounter for antineoplastic chemotherapy: Secondary | ICD-10-CM | POA: Diagnosis not present

## 2021-12-08 DIAGNOSIS — E119 Type 2 diabetes mellitus without complications: Secondary | ICD-10-CM | POA: Diagnosis not present

## 2021-12-08 DIAGNOSIS — I10 Essential (primary) hypertension: Secondary | ICD-10-CM | POA: Diagnosis not present

## 2021-12-08 DIAGNOSIS — C160 Malignant neoplasm of cardia: Secondary | ICD-10-CM | POA: Diagnosis not present

## 2021-12-08 DIAGNOSIS — E039 Hypothyroidism, unspecified: Secondary | ICD-10-CM | POA: Diagnosis not present

## 2021-12-08 DIAGNOSIS — I2510 Atherosclerotic heart disease of native coronary artery without angina pectoris: Secondary | ICD-10-CM | POA: Diagnosis not present

## 2021-12-08 DIAGNOSIS — G4730 Sleep apnea, unspecified: Secondary | ICD-10-CM | POA: Diagnosis not present

## 2021-12-08 DIAGNOSIS — Z510 Encounter for antineoplastic radiation therapy: Secondary | ICD-10-CM | POA: Diagnosis not present

## 2021-12-08 DIAGNOSIS — D509 Iron deficiency anemia, unspecified: Secondary | ICD-10-CM | POA: Diagnosis not present

## 2021-12-09 ENCOUNTER — Ambulatory Visit
Admission: RE | Admit: 2021-12-09 | Discharge: 2021-12-09 | Disposition: A | Discharge status: Home / Self Care | Payer: Medicare PPO | Source: Ambulatory Visit | Attending: Radiation Oncology | Admitting: Radiation Oncology

## 2021-12-09 DIAGNOSIS — E039 Hypothyroidism, unspecified: Secondary | ICD-10-CM | POA: Diagnosis not present

## 2021-12-09 DIAGNOSIS — G4730 Sleep apnea, unspecified: Secondary | ICD-10-CM | POA: Diagnosis not present

## 2021-12-09 DIAGNOSIS — D509 Iron deficiency anemia, unspecified: Secondary | ICD-10-CM | POA: Diagnosis not present

## 2021-12-09 DIAGNOSIS — Z510 Encounter for antineoplastic radiation therapy: Secondary | ICD-10-CM | POA: Diagnosis not present

## 2021-12-09 DIAGNOSIS — C160 Malignant neoplasm of cardia: Secondary | ICD-10-CM | POA: Diagnosis not present

## 2021-12-09 DIAGNOSIS — I2510 Atherosclerotic heart disease of native coronary artery without angina pectoris: Secondary | ICD-10-CM | POA: Diagnosis not present

## 2021-12-09 DIAGNOSIS — I10 Essential (primary) hypertension: Secondary | ICD-10-CM | POA: Diagnosis not present

## 2021-12-09 DIAGNOSIS — Z5111 Encounter for antineoplastic chemotherapy: Secondary | ICD-10-CM | POA: Diagnosis not present

## 2021-12-09 DIAGNOSIS — E119 Type 2 diabetes mellitus without complications: Secondary | ICD-10-CM | POA: Diagnosis not present

## 2021-12-10 ENCOUNTER — Other Ambulatory Visit: Payer: Self-pay

## 2021-12-10 ENCOUNTER — Ambulatory Visit
Admission: RE | Admit: 2021-12-10 | Discharge: 2021-12-10 | Disposition: A | Discharge status: Home / Self Care | Payer: Medicare PPO | Source: Ambulatory Visit | Attending: Radiation Oncology | Admitting: Radiation Oncology

## 2021-12-10 ENCOUNTER — Inpatient Hospital Stay: Payer: Medicare PPO

## 2021-12-10 DIAGNOSIS — D509 Iron deficiency anemia, unspecified: Secondary | ICD-10-CM | POA: Diagnosis not present

## 2021-12-10 DIAGNOSIS — C160 Malignant neoplasm of cardia: Secondary | ICD-10-CM

## 2021-12-10 DIAGNOSIS — G4730 Sleep apnea, unspecified: Secondary | ICD-10-CM | POA: Diagnosis not present

## 2021-12-10 DIAGNOSIS — Z5111 Encounter for antineoplastic chemotherapy: Secondary | ICD-10-CM | POA: Diagnosis not present

## 2021-12-10 DIAGNOSIS — I2510 Atherosclerotic heart disease of native coronary artery without angina pectoris: Secondary | ICD-10-CM | POA: Diagnosis not present

## 2021-12-10 DIAGNOSIS — Z510 Encounter for antineoplastic radiation therapy: Secondary | ICD-10-CM | POA: Diagnosis not present

## 2021-12-10 DIAGNOSIS — I10 Essential (primary) hypertension: Secondary | ICD-10-CM | POA: Diagnosis not present

## 2021-12-10 DIAGNOSIS — E119 Type 2 diabetes mellitus without complications: Secondary | ICD-10-CM | POA: Diagnosis not present

## 2021-12-10 DIAGNOSIS — E039 Hypothyroidism, unspecified: Secondary | ICD-10-CM | POA: Diagnosis not present

## 2021-12-10 LAB — CBC WITH DIFFERENTIAL (CANCER CENTER ONLY)
Abs Immature Granulocytes: 0 10*3/uL (ref 0.00–0.07)
Basophils Absolute: 0 10*3/uL (ref 0.0–0.1)
Basophils Relative: 2 %
Eosinophils Absolute: 0 10*3/uL (ref 0.0–0.5)
Eosinophils Relative: 2 %
HCT: 36 % — ABNORMAL LOW (ref 39.0–52.0)
Hemoglobin: 11 g/dL — ABNORMAL LOW (ref 13.0–17.0)
Immature Granulocytes: 0 %
Lymphocytes Relative: 4 %
Lymphs Abs: 0.1 10*3/uL — ABNORMAL LOW (ref 0.7–4.0)
MCH: 23.4 pg — ABNORMAL LOW (ref 26.0–34.0)
MCHC: 30.6 g/dL (ref 30.0–36.0)
MCV: 76.6 fL — ABNORMAL LOW (ref 80.0–100.0)
Monocytes Absolute: 0.4 10*3/uL (ref 0.1–1.0)
Monocytes Relative: 22 %
Neutro Abs: 1.3 10*3/uL — ABNORMAL LOW (ref 1.7–7.7)
Neutrophils Relative %: 70 %
Platelet Count: 199 10*3/uL (ref 150–400)
RBC: 4.7 MIL/uL (ref 4.22–5.81)
RDW: 21.9 % — ABNORMAL HIGH (ref 11.5–15.5)
WBC Count: 1.8 10*3/uL — ABNORMAL LOW (ref 4.0–10.5)
nRBC: 0 % (ref 0.0–0.2)

## 2021-12-10 LAB — FERRITIN: Ferritin: 67 ng/mL (ref 24–336)

## 2021-12-10 NOTE — Progress Notes (Signed)
Pt not treated today due to [place] 1.3 per Dr Benay Spice. Educated on neutropenic precautions.

## 2021-12-11 ENCOUNTER — Ambulatory Visit
Admission: RE | Admit: 2021-12-11 | Discharge: 2021-12-11 | Disposition: A | Discharge status: Home / Self Care | Payer: Medicare PPO | Source: Ambulatory Visit | Attending: Radiation Oncology | Admitting: Radiation Oncology

## 2021-12-11 DIAGNOSIS — D509 Iron deficiency anemia, unspecified: Secondary | ICD-10-CM | POA: Diagnosis not present

## 2021-12-11 DIAGNOSIS — E039 Hypothyroidism, unspecified: Secondary | ICD-10-CM | POA: Diagnosis not present

## 2021-12-11 DIAGNOSIS — Z510 Encounter for antineoplastic radiation therapy: Secondary | ICD-10-CM | POA: Diagnosis not present

## 2021-12-11 DIAGNOSIS — G4730 Sleep apnea, unspecified: Secondary | ICD-10-CM | POA: Diagnosis not present

## 2021-12-11 DIAGNOSIS — C160 Malignant neoplasm of cardia: Secondary | ICD-10-CM

## 2021-12-11 DIAGNOSIS — Z5111 Encounter for antineoplastic chemotherapy: Secondary | ICD-10-CM | POA: Diagnosis not present

## 2021-12-11 DIAGNOSIS — I10 Essential (primary) hypertension: Secondary | ICD-10-CM | POA: Diagnosis not present

## 2021-12-11 DIAGNOSIS — I2510 Atherosclerotic heart disease of native coronary artery without angina pectoris: Secondary | ICD-10-CM | POA: Diagnosis not present

## 2021-12-11 DIAGNOSIS — E119 Type 2 diabetes mellitus without complications: Secondary | ICD-10-CM | POA: Diagnosis not present

## 2021-12-11 MED ORDER — SONAFINE EX EMUL
1.0000 "application " | Freq: Two times a day (BID) | CUTANEOUS | Status: DC
  Administered 2021-12-11: 1 via TOPICAL

## 2021-12-14 ENCOUNTER — Other Ambulatory Visit: Payer: Self-pay | Admitting: Oncology

## 2021-12-15 ENCOUNTER — Other Ambulatory Visit: Payer: Self-pay

## 2021-12-15 ENCOUNTER — Telehealth: Payer: Self-pay | Admitting: Oncology

## 2021-12-15 ENCOUNTER — Ambulatory Visit
Admission: RE | Admit: 2021-12-15 | Discharge: 2021-12-15 | Disposition: A | Discharge status: Home / Self Care | Payer: Medicare PPO | Source: Ambulatory Visit | Attending: Radiation Oncology | Admitting: Radiation Oncology

## 2021-12-15 DIAGNOSIS — G4730 Sleep apnea, unspecified: Secondary | ICD-10-CM | POA: Diagnosis not present

## 2021-12-15 DIAGNOSIS — D509 Iron deficiency anemia, unspecified: Secondary | ICD-10-CM | POA: Insufficient documentation

## 2021-12-15 DIAGNOSIS — Z510 Encounter for antineoplastic radiation therapy: Secondary | ICD-10-CM | POA: Diagnosis not present

## 2021-12-15 DIAGNOSIS — E039 Hypothyroidism, unspecified: Secondary | ICD-10-CM | POA: Insufficient documentation

## 2021-12-15 DIAGNOSIS — I2510 Atherosclerotic heart disease of native coronary artery without angina pectoris: Secondary | ICD-10-CM | POA: Diagnosis not present

## 2021-12-15 DIAGNOSIS — Z923 Personal history of irradiation: Secondary | ICD-10-CM | POA: Diagnosis not present

## 2021-12-15 DIAGNOSIS — I10 Essential (primary) hypertension: Secondary | ICD-10-CM | POA: Insufficient documentation

## 2021-12-15 DIAGNOSIS — C160 Malignant neoplasm of cardia: Secondary | ICD-10-CM | POA: Insufficient documentation

## 2021-12-15 DIAGNOSIS — Z5112 Encounter for antineoplastic immunotherapy: Secondary | ICD-10-CM | POA: Diagnosis not present

## 2021-12-15 DIAGNOSIS — E119 Type 2 diabetes mellitus without complications: Secondary | ICD-10-CM | POA: Insufficient documentation

## 2021-12-15 DIAGNOSIS — Z293 Encounter for prophylactic fluoride administration: Secondary | ICD-10-CM | POA: Insufficient documentation

## 2021-12-15 DIAGNOSIS — D630 Anemia in neoplastic disease: Secondary | ICD-10-CM | POA: Diagnosis not present

## 2021-12-15 DIAGNOSIS — Z9221 Personal history of antineoplastic chemotherapy: Secondary | ICD-10-CM | POA: Insufficient documentation

## 2021-12-15 NOTE — Telephone Encounter (Signed)
Contacted patient to schedule treatment per sch msg 1/3, patient is aware of treatment day. Had to decouple per RN [place] for 1/6.

## 2021-12-16 ENCOUNTER — Ambulatory Visit
Admission: RE | Admit: 2021-12-16 | Discharge: 2021-12-16 | Disposition: A | Discharge status: Home / Self Care | Payer: Medicare PPO | Source: Ambulatory Visit | Attending: Radiation Oncology | Admitting: Radiation Oncology

## 2021-12-16 DIAGNOSIS — C160 Malignant neoplasm of cardia: Secondary | ICD-10-CM | POA: Diagnosis not present

## 2021-12-16 DIAGNOSIS — G4730 Sleep apnea, unspecified: Secondary | ICD-10-CM | POA: Diagnosis not present

## 2021-12-16 DIAGNOSIS — I2510 Atherosclerotic heart disease of native coronary artery without angina pectoris: Secondary | ICD-10-CM | POA: Diagnosis not present

## 2021-12-16 DIAGNOSIS — Z510 Encounter for antineoplastic radiation therapy: Secondary | ICD-10-CM | POA: Diagnosis not present

## 2021-12-16 DIAGNOSIS — E039 Hypothyroidism, unspecified: Secondary | ICD-10-CM | POA: Diagnosis not present

## 2021-12-16 DIAGNOSIS — Z5112 Encounter for antineoplastic immunotherapy: Secondary | ICD-10-CM | POA: Diagnosis not present

## 2021-12-16 DIAGNOSIS — E119 Type 2 diabetes mellitus without complications: Secondary | ICD-10-CM | POA: Diagnosis not present

## 2021-12-16 DIAGNOSIS — D509 Iron deficiency anemia, unspecified: Secondary | ICD-10-CM | POA: Diagnosis not present

## 2021-12-16 DIAGNOSIS — I10 Essential (primary) hypertension: Secondary | ICD-10-CM | POA: Diagnosis not present

## 2021-12-17 ENCOUNTER — Encounter: Payer: Self-pay | Admitting: Nurse Practitioner

## 2021-12-17 ENCOUNTER — Inpatient Hospital Stay: Payer: Medicare PPO | Admitting: Nurse Practitioner

## 2021-12-17 ENCOUNTER — Inpatient Hospital Stay: Payer: Medicare PPO

## 2021-12-17 ENCOUNTER — Other Ambulatory Visit: Payer: Self-pay

## 2021-12-17 ENCOUNTER — Ambulatory Visit
Admission: RE | Admit: 2021-12-17 | Discharge: 2021-12-17 | Disposition: A | Discharge status: Home / Self Care | Payer: Medicare PPO | Source: Ambulatory Visit | Attending: Radiation Oncology | Admitting: Radiation Oncology

## 2021-12-17 VITALS — BP 141/82 | HR 90 | Temp 97.8°F | Resp 20 | Ht 72.0 in | Wt 219.4 lb

## 2021-12-17 DIAGNOSIS — I2510 Atherosclerotic heart disease of native coronary artery without angina pectoris: Secondary | ICD-10-CM | POA: Diagnosis not present

## 2021-12-17 DIAGNOSIS — I10 Essential (primary) hypertension: Secondary | ICD-10-CM | POA: Insufficient documentation

## 2021-12-17 DIAGNOSIS — Z5112 Encounter for antineoplastic immunotherapy: Secondary | ICD-10-CM | POA: Insufficient documentation

## 2021-12-17 DIAGNOSIS — Z9221 Personal history of antineoplastic chemotherapy: Secondary | ICD-10-CM | POA: Insufficient documentation

## 2021-12-17 DIAGNOSIS — C160 Malignant neoplasm of cardia: Secondary | ICD-10-CM | POA: Diagnosis not present

## 2021-12-17 DIAGNOSIS — D509 Iron deficiency anemia, unspecified: Secondary | ICD-10-CM | POA: Diagnosis not present

## 2021-12-17 DIAGNOSIS — D630 Anemia in neoplastic disease: Secondary | ICD-10-CM | POA: Insufficient documentation

## 2021-12-17 DIAGNOSIS — Z510 Encounter for antineoplastic radiation therapy: Secondary | ICD-10-CM | POA: Diagnosis not present

## 2021-12-17 DIAGNOSIS — E039 Hypothyroidism, unspecified: Secondary | ICD-10-CM | POA: Diagnosis not present

## 2021-12-17 DIAGNOSIS — Z923 Personal history of irradiation: Secondary | ICD-10-CM | POA: Insufficient documentation

## 2021-12-17 DIAGNOSIS — G4730 Sleep apnea, unspecified: Secondary | ICD-10-CM | POA: Insufficient documentation

## 2021-12-17 DIAGNOSIS — E119 Type 2 diabetes mellitus without complications: Secondary | ICD-10-CM | POA: Diagnosis not present

## 2021-12-17 LAB — CMP (CANCER CENTER ONLY)
ALT: 15 U/L (ref 0–44)
AST: 12 U/L — ABNORMAL LOW (ref 15–41)
Albumin: 4.2 g/dL (ref 3.5–5.0)
Alkaline Phosphatase: 88 U/L (ref 38–126)
Anion gap: 10 (ref 5–15)
BUN: 11 mg/dL (ref 8–23)
CO2: 26 mmol/L (ref 22–32)
Calcium: 9.5 mg/dL (ref 8.9–10.3)
Chloride: 102 mmol/L (ref 98–111)
Creatinine: 0.87 mg/dL (ref 0.61–1.24)
GFR, Estimated: >60 mL/min (ref >60)
Glucose, Bld: 193 mg/dL — ABNORMAL HIGH (ref 70–99)
Potassium: 3.6 mmol/L (ref 3.5–5.1)
Sodium: 138 mmol/L (ref 135–145)
Total Bilirubin: 0.5 mg/dL (ref 0.3–1.2)
Total Protein: 7.3 g/dL (ref 6.5–8.1)

## 2021-12-17 LAB — CBC WITH DIFFERENTIAL (CANCER CENTER ONLY)
Abs Immature Granulocytes: 0.01 10*3/uL (ref 0.00–0.07)
Basophils Absolute: 0 10*3/uL (ref 0.0–0.1)
Basophils Relative: 1 %
Eosinophils Absolute: 0.1 10*3/uL (ref 0.0–0.5)
Eosinophils Relative: 3 %
HCT: 34.5 % — ABNORMAL LOW (ref 39.0–52.0)
Hemoglobin: 10.7 g/dL — ABNORMAL LOW (ref 13.0–17.0)
Immature Granulocytes: 0 %
Lymphocytes Relative: 7 %
Lymphs Abs: 0.2 10*3/uL — ABNORMAL LOW (ref 0.7–4.0)
MCH: 23.8 pg — ABNORMAL LOW (ref 26.0–34.0)
MCHC: 31 g/dL (ref 30.0–36.0)
MCV: 76.7 fL — ABNORMAL LOW (ref 80.0–100.0)
Monocytes Absolute: 0.6 10*3/uL (ref 0.1–1.0)
Monocytes Relative: 20 %
Neutro Abs: 1.9 10*3/uL (ref 1.7–7.7)
Neutrophils Relative %: 69 %
Platelet Count: 194 10*3/uL (ref 150–400)
RBC: 4.5 MIL/uL (ref 4.22–5.81)
RDW: 22.3 % — ABNORMAL HIGH (ref 11.5–15.5)
WBC Count: 2.8 10*3/uL — ABNORMAL LOW (ref 4.0–10.5)
nRBC: 0 % (ref 0.0–0.2)

## 2021-12-17 NOTE — Progress Notes (Signed)
[facility] OFFICE PROGRESS NOTE   Diagnosis: Gastric cancer  INTERVAL HISTORY:   Mr. Rodkey returns as scheduled.  Chemotherapy was held 12/10/2021 due to [place] 1.3.  He has occasional nausea.  No mouth sores.  He tends to have loose stools, 1-2 times per day.  He has mild intermittent numbness/tingling in the fingertips.  Oral intake has been decreased due to abnormal taste.  Objective:  Vital signs in last 24 hours:  Blood pressure (!) 141/82, pulse 90, temperature 97.8 F (36.6 C), temperature source Oral, resp. rate 20, height 6' (1.829 m), weight 219 lb 6.4 oz (99.5 kg), SpO2 100 %.    HEENT: No thrush or ulcers. Resp: Lungs clear bilaterally. Cardio: Regular rate and rhythm. GI: Abdomen soft and nontender.  No hepatomegaly. Vascular: No leg edema. Neuro: Vibratory sense intact over the fingertips per tuning fork exam. Skin: Palms without erythema.   Lab Results:  Lab Results  Component Value Date   WBC 2.8 (L) 12/17/2021   HGB 10.7 (L) 12/17/2021   HCT 34.5 (L) 12/17/2021   MCV 76.7 (L) 12/17/2021   PLT 194 12/17/2021   NEUTROABS 1.9 12/17/2021    Imaging:  No results found.  Medications: I have reviewed the patient's current medications.  Assessment/Plan: Gastric cancer CT abdomen/pelvis 10/08/2021-probable mass in the proximal stomach, enlarged gastropathic ligament node Upper endoscopy 10/01/2021-gastric cardia/fundus mass with oozing and stigmata of recent bleeding-biopsy at least intramucosal adenocarcinoma, mismatch repair protein expression intact, PD-L1 combined positive score 7%, HER2 positive PET 10/20/2021-hypermetabolic proximal gastric primary, or hypermetabolic gastropathic ligament node, right paramidline prostatic hypermetabolism, anal hypermetabolism without a CT mass Radiation 11/11/2021 Cycle 1  Taxol/carboplatin 11/12/2021 Cycle 2 Taxol/carboplatin 11/19/2021 Cycle 3 Taxol/carboplatin 11/26/2021 Cycle 4 Taxol/carboplatin  12/03/2021 Cycle 5 Taxol/carboplatin 12/17/2021  Microcytic anemia secondary to #1 Diabetes Hypertension Coronary artery disease, cardiac catheterization 08/11/2021-placement of proximal and distal right coronary artery stents Hypothyroidism Sleep apnea Anal hypermetabolism on the PET 10/20/2021-no mass identified  Disposition: Mr. Maclay appears stable.  He continues radiation.  He has completed 4 treatments with weekly Taxol/carboplatin.  Week 5 was held last week due to mild neutropenia.  Plan to proceed with week 5 Taxol/carboplatin as scheduled.  CBC and chemistry panel reviewed.  Labs adequate to proceed as above.  He will return for lab and follow-up in the next 2 to 3 weeks.  We are available to see him sooner if needed.    Ned Card ANP/GNP-BC   12/17/2021  11:55 AM

## 2021-12-18 ENCOUNTER — Other Ambulatory Visit: Payer: Self-pay | Admitting: Oncology

## 2021-12-18 ENCOUNTER — Inpatient Hospital Stay: Payer: Medicare PPO

## 2021-12-18 ENCOUNTER — Ambulatory Visit
Admission: RE | Admit: 2021-12-18 | Discharge: 2021-12-18 | Disposition: A | Discharge status: Home / Self Care | Payer: Medicare PPO | Source: Ambulatory Visit | Attending: Radiation Oncology | Admitting: Radiation Oncology

## 2021-12-18 VITALS — BP 131/82 | HR 81 | Temp 98.2°F | Resp 18

## 2021-12-18 DIAGNOSIS — Z510 Encounter for antineoplastic radiation therapy: Secondary | ICD-10-CM | POA: Diagnosis not present

## 2021-12-18 DIAGNOSIS — D509 Iron deficiency anemia, unspecified: Secondary | ICD-10-CM | POA: Diagnosis not present

## 2021-12-18 DIAGNOSIS — Z5112 Encounter for antineoplastic immunotherapy: Secondary | ICD-10-CM | POA: Diagnosis not present

## 2021-12-18 DIAGNOSIS — C160 Malignant neoplasm of cardia: Secondary | ICD-10-CM | POA: Diagnosis not present

## 2021-12-18 DIAGNOSIS — G4730 Sleep apnea, unspecified: Secondary | ICD-10-CM | POA: Diagnosis not present

## 2021-12-18 DIAGNOSIS — E039 Hypothyroidism, unspecified: Secondary | ICD-10-CM | POA: Diagnosis not present

## 2021-12-18 DIAGNOSIS — E119 Type 2 diabetes mellitus without complications: Secondary | ICD-10-CM | POA: Diagnosis not present

## 2021-12-18 DIAGNOSIS — I10 Essential (primary) hypertension: Secondary | ICD-10-CM | POA: Diagnosis not present

## 2021-12-18 DIAGNOSIS — I2510 Atherosclerotic heart disease of native coronary artery without angina pectoris: Secondary | ICD-10-CM | POA: Diagnosis not present

## 2021-12-18 MED ORDER — SODIUM CHLORIDE 0.9 % IV SOLN
250.0000 mg | Freq: Once | INTRAVENOUS | Status: AC
  Administered 2021-12-18: 250 mg via INTRAVENOUS
  Filled 2021-12-18: qty 25

## 2021-12-18 MED ORDER — SODIUM CHLORIDE 0.9 % IV SOLN
10.0000 mg | Freq: Once | INTRAVENOUS | Status: AC
  Administered 2021-12-18: 10 mg via INTRAVENOUS
  Filled 2021-12-18: qty 1

## 2021-12-18 MED ORDER — PALONOSETRON HCL INJECTION 0.25 MG/5ML
0.2500 mg | Freq: Once | INTRAVENOUS | Status: AC
  Administered 2021-12-18: 0.25 mg via INTRAVENOUS
  Filled 2021-12-18: qty 5

## 2021-12-18 MED ORDER — SODIUM CHLORIDE 0.9 % IV SOLN: Freq: Once | INTRAVENOUS | Status: AC

## 2021-12-18 MED ORDER — SODIUM CHLORIDE 0.9 % IV SOLN
50.0000 mg/m2 | Freq: Once | INTRAVENOUS | Status: AC
  Administered 2021-12-18: 114 mg via INTRAVENOUS
  Filled 2021-12-18: qty 19

## 2021-12-18 MED ORDER — DIPHENHYDRAMINE HCL 50 MG/ML IJ SOLN
25.0000 mg | Freq: Once | INTRAMUSCULAR | Status: AC
  Administered 2021-12-18: 25 mg via INTRAVENOUS
  Filled 2021-12-18: qty 1

## 2021-12-18 MED ORDER — FAMOTIDINE 20 MG IN NS 100 ML IVPB
20.0000 mg | Freq: Once | INTRAVENOUS | Status: AC
  Administered 2021-12-18: 20 mg via INTRAVENOUS
  Filled 2021-12-18: qty 100

## 2021-12-18 NOTE — Progress Notes (Signed)
Patient presents for treatment. RN assessment completed along with the following:  Labs/vitals reviewed - Yes, and within treatment parameters.   Weight within 10% of previous measurement - Yes Oncology Treatment Attestation completed for current therapy- Yes, on date 10/28/21 Informed consent completed and reflects current therapy/intent - Yes, on date 11/12/21             Provider progress note reviewed - Patient not seen by provider today. Most recent note dated 12/17/21 reviewed. Treatment/Antibody/Supportive plan reviewed - Yes, and there are no adjustments needed for today's treatment. S&H and other orders reviewed - Yes, and there are no additional orders identified. Previous treatment date reviewed - Yes, and the appropriate amount of time has elapsed between treatments. Clinic Hand Off Received from - none   Patient to proceed with treatment.

## 2021-12-18 NOTE — Patient Instructions (Signed)
[facility]   Discharge Instructions: Thank you for choosing [facility] to provide your oncology and hematology care.   If you have a lab appointment with the [facility], please go directly to the [facility] and check in at the registration area.   Wear comfortable clothing and clothing appropriate for easy access to any Portacath or PICC line.   We strive to give you quality time with your provider. You may need to reschedule your appointment if you arrive late (15 or more minutes).  Arriving late affects you and other patients whose appointments are after yours.  Also, if you miss three or more appointments without notifying the office, you may be dismissed from the clinic at the providers discretion.      For prescription refill requests, have your pharmacy contact our office and allow 72 hours for refills to be completed.    Today you received the following chemotherapy and/or immunotherapy agents Taxol, carboplatin      To help prevent nausea and vomiting after your treatment, we encourage you to take your nausea medication as directed.  BELOW ARE SYMPTOMS THAT SHOULD BE REPORTED IMMEDIATELY: *FEVER GREATER THAN 100.4 F (38 C) OR HIGHER *CHILLS OR SWEATING *NAUSEA AND VOMITING THAT IS NOT CONTROLLED WITH YOUR NAUSEA MEDICATION *UNUSUAL SHORTNESS OF BREATH *UNUSUAL BRUISING OR BLEEDING *URINARY PROBLEMS (pain or burning when urinating, or frequent urination) *BOWEL PROBLEMS (unusual diarrhea, constipation, pain near the anus) TENDERNESS IN MOUTH AND THROAT WITH OR WITHOUT PRESENCE OF ULCERS (sore throat, sores in mouth, or a toothache) UNUSUAL RASH, SWELLING OR PAIN  UNUSUAL VAGINAL DISCHARGE OR ITCHING   Items with * indicate a potential emergency and should be followed up as soon as possible or go to the Emergency Department if any problems should occur.  Please show the CHEMOTHERAPY ALERT CARD or IMMUNOTHERAPY ALERT CARD at  check-in to the Emergency Department and triage nurse.  Should you have questions after your visit or need to cancel or reschedule your appointment, please contact [facility]  Dept: [phone]  and follow the prompts.  Office hours are 8:00 a.m. to 4:30 p.m. Monday - Friday. Please note that voicemails left after 4:00 p.m. may not be returned until the following business day.  We are closed weekends and major holidays. You have access to a nurse at all times for urgent questions. Please call the main number to the clinic Dept: [phone] and follow the prompts.   For any non-urgent questions, you may also contact your provider using MyChart. We now offer e-Visits for anyone 80 and older to request care online for non-urgent symptoms. For details visit [email or web address].   Also download the MyChart app! Go to the app store, search "MyChart", open the app, select [facility], and log in with your MyChart username and password.  Due to Covid, a mask is required upon entering the hospital/clinic. If you do not have a mask, one will be given to you upon arrival. For doctor visits, patients may have 1 support person aged 103 or older with them. For treatment visits, patients cannot have anyone with them due to current Covid guidelines and our immunocompromised population.   Paclitaxel injection What is this medication? PACLITAXEL (PAK li TAX el) is a chemotherapy drug. It targets fast dividing cells, like cancer cells, and causes these cells to die. This medicine is used to treat ovarian cancer, breast cancer, lung cancer, Kaposi's sarcoma, and other cancers. This  medicine may be used for other purposes; ask your health care provider or pharmacist if you have questions. COMMON BRAND NAME(S): Onxol, Taxol What should I tell my care team before I take this medication? They need to know if you have any of these conditions: history of irregular heartbeat liver  disease low blood counts, like low white cell, platelet, or red cell counts lung or breathing disease, like asthma tingling of the fingers or toes, or other nerve disorder an unusual or allergic reaction to paclitaxel, alcohol, polyoxyethylated castor oil, other chemotherapy, other medicines, foods, dyes, or preservatives pregnant or trying to get pregnant breast-feeding How should I use this medication? This drug is given as an infusion into a vein. It is administered in a hospital or clinic by a specially trained health care professional. Talk to your pediatrician regarding the use of this medicine in children. Special care may be needed. Overdosage: If you think you have taken too much of this medicine contact a poison control center or emergency room at once. NOTE: This medicine is only for you. Do not share this medicine with others. What if I miss a dose? It is important not to miss your dose. Call your doctor or health care professional if you are unable to keep an appointment. What may interact with this medication? Do not take this medicine with any of the following medications: live virus vaccines This medicine may also interact with the following medications: antiviral medicines for hepatitis, HIV or AIDS certain antibiotics like erythromycin and clarithromycin certain medicines for fungal infections like ketoconazole and itraconazole certain medicines for seizures like carbamazepine, phenobarbital, phenytoin gemfibrozil nefazodone rifampin St. John's wort This list may not describe all possible interactions. Give your health care provider a list of all the medicines, herbs, non-prescription drugs, or dietary supplements you use. Also tell them if you smoke, drink alcohol, or use illegal drugs. Some items may interact with your medicine. What should I watch for while using this medication? Your condition will be monitored carefully while you are receiving this medicine. You  will need important blood work done while you are taking this medicine. This medicine can cause serious allergic reactions. To reduce your risk you will need to take other medicine(s) before treatment with this medicine. If you experience allergic reactions like skin rash, itching or hives, swelling of the face, lips, or tongue, tell your doctor or health care professional right away. In some cases, you may be given additional medicines to help with side effects. Follow all directions for their use. This drug may make you feel generally unwell. This is not uncommon, as chemotherapy can affect healthy cells as well as cancer cells. Report any side effects. Continue your course of treatment even though you feel ill unless your doctor tells you to stop. Call your doctor or health care professional for advice if you get a fever, chills or sore throat, or other symptoms of a cold or flu. Do not treat yourself. This drug decreases your body's ability to fight infections. Try to avoid being around people who are sick. This medicine may increase your risk to bruise or bleed. Call your doctor or health care professional if you notice any unusual bleeding. Be careful brushing and flossing your teeth or using a toothpick because you may get an infection or bleed more easily. If you have any dental work done, tell your dentist you are receiving this medicine. Avoid taking products that contain aspirin, acetaminophen, ibuprofen, naproxen, or ketoprofen unless instructed  by your doctor. These medicines may hide a fever. Do not become pregnant while taking this medicine. Women should inform their doctor if they wish to become pregnant or think they might be pregnant. There is a potential for serious side effects to an unborn child. Talk to your health care professional or pharmacist for more information. Do not breast-feed an infant while taking this medicine. Men are advised not to father a child while receiving this  medicine. This product may contain alcohol. Ask your pharmacist or healthcare provider if this medicine contains alcohol. Be sure to tell all healthcare providers you are taking this medicine. Certain medicines, like metronidazole and disulfiram, can cause an unpleasant reaction when taken with alcohol. The reaction includes flushing, headache, nausea, vomiting, sweating, and increased thirst. The reaction can last from 30 minutes to several hours. What side effects may I notice from receiving this medication? Side effects that you should report to your doctor or health care professional as soon as possible: allergic reactions like skin rash, itching or hives, swelling of the face, lips, or tongue breathing problems changes in vision fast, irregular heartbeat high or low blood pressure mouth sores pain, tingling, numbness in the hands or feet signs of decreased platelets or bleeding - bruising, pinpoint red spots on the skin, black, tarry stools, blood in the urine signs of decreased red blood cells - unusually weak or tired, feeling faint or lightheaded, falls signs of infection - fever or chills, cough, sore throat, pain or difficulty passing urine signs and symptoms of liver injury like dark yellow or brown urine; general ill feeling or flu-like symptoms; light-colored stools; loss of appetite; nausea; right upper belly pain; unusually weak or tired; yellowing of the eyes or skin swelling of the ankles, feet, hands unusually slow heartbeat Side effects that usually do not require medical attention (report to your doctor or health care professional if they continue or are bothersome): diarrhea hair loss loss of appetite muscle or joint pain nausea, vomiting pain, redness, or irritation at site where injected tiredness This list may not describe all possible side effects. Call your doctor for medical advice about side effects. You may report side effects to FDA at 1-800-FDA-1088. Where  should I keep my medication? This drug is given in a hospital or clinic and will not be stored at home. NOTE: This sheet is a summary. It may not cover all possible information. If you have questions about this medicine, talk to your doctor, pharmacist, or health care provider.  2022 [employer]/Gold Standard (2021-08-18 00:00:00)  Carboplatin injection What is this medication? CARBOPLATIN (KAR boe pla tin) is a chemotherapy drug. It targets fast dividing cells, like cancer cells, and causes these cells to die. This medicine is used to treat ovarian cancer and many other cancers. This medicine may be used for other purposes; ask your health care provider or pharmacist if you have questions. COMMON BRAND NAME(S): Paraplatin What should I tell my care team before I take this medication? They need to know if you have any of these conditions: blood disorders hearing problems kidney disease recent or ongoing radiation therapy an unusual or allergic reaction to carboplatin, cisplatin, other chemotherapy, other medicines, foods, dyes, or preservatives pregnant or trying to get pregnant breast-feeding How should I use this medication? This drug is usually given as an infusion into a vein. It is administered in a hospital or clinic by a specially trained health care professional. Talk to your pediatrician regarding the use of this medicine  in children. Special care may be needed. Overdosage: If you think you have taken too much of this medicine contact a poison control center or emergency room at once. NOTE: This medicine is only for you. Do not share this medicine with others. What if I miss a dose? It is important not to miss a dose. Call your doctor or health care professional if you are unable to keep an appointment. What may interact with this medication? medicines for seizures medicines to increase blood counts like filgrastim, pegfilgrastim, sargramostim some antibiotics like amikacin,  gentamicin, neomycin, streptomycin, tobramycin vaccines Talk to your doctor or health care professional before taking any of these medicines: acetaminophen aspirin ibuprofen ketoprofen naproxen This list may not describe all possible interactions. Give your health care provider a list of all the medicines, herbs, non-prescription drugs, or dietary supplements you use. Also tell them if you smoke, drink alcohol, or use illegal drugs. Some items may interact with your medicine. What should I watch for while using this medication? Your condition will be monitored carefully while you are receiving this medicine. You will need important blood work done while you are taking this medicine. This drug may make you feel generally unwell. This is not uncommon, as chemotherapy can affect healthy cells as well as cancer cells. Report any side effects. Continue your course of treatment even though you feel ill unless your doctor tells you to stop. In some cases, you may be given additional medicines to help with side effects. Follow all directions for their use. Call your doctor or health care professional for advice if you get a fever, chills or sore throat, or other symptoms of a cold or flu. Do not treat yourself. This drug decreases your body's ability to fight infections. Try to avoid being around people who are sick. This medicine may increase your risk to bruise or bleed. Call your doctor or health care professional if you notice any unusual bleeding. Be careful brushing and flossing your teeth or using a toothpick because you may get an infection or bleed more easily. If you have any dental work done, tell your dentist you are receiving this medicine. Avoid taking products that contain aspirin, acetaminophen, ibuprofen, naproxen, or ketoprofen unless instructed by your doctor. These medicines may hide a fever. Do not become pregnant while taking this medicine. Women should inform their doctor if they wish  to become pregnant or think they might be pregnant. There is a potential for serious side effects to an unborn child. Talk to your health care professional or pharmacist for more information. Do not breast-feed an infant while taking this medicine. What side effects may I notice from receiving this medication? Side effects that you should report to your doctor or health care professional as soon as possible: allergic reactions like skin rash, itching or hives, swelling of the face, lips, or tongue signs of infection - fever or chills, cough, sore throat, pain or difficulty passing urine signs of decreased platelets or bleeding - bruising, pinpoint red spots on the skin, black, tarry stools, nosebleeds signs of decreased red blood cells - unusually weak or tired, fainting spells, lightheadedness breathing problems changes in hearing changes in vision chest pain high blood pressure low blood counts - This drug may decrease the number of white blood cells, red blood cells and platelets. You may be at increased risk for infections and bleeding. nausea and vomiting pain, swelling, redness or irritation at the injection site pain, tingling, numbness in the hands or  feet problems with balance, talking, walking trouble passing urine or change in the amount of urine Side effects that usually do not require medical attention (report to your doctor or health care professional if they continue or are bothersome): hair loss loss of appetite metallic taste in the mouth or changes in taste This list may not describe all possible side effects. Call your doctor for medical advice about side effects. You may report side effects to FDA at 1-800-FDA-1088. Where should I keep my medication? This drug is given in a hospital or clinic and will not be stored at home. NOTE: This sheet is a summary. It may not cover all possible information. If you have questions about this medicine, talk to your doctor, pharmacist,  or health care provider.  2022 [employer]vier/Gold Standard (2008-05-08 00:00:00)

## 2021-12-21 ENCOUNTER — Ambulatory Visit
Admission: RE | Admit: 2021-12-21 | Discharge: 2021-12-21 | Disposition: A | Discharge status: Home / Self Care | Payer: Medicare PPO | Source: Ambulatory Visit | Attending: Radiation Oncology | Admitting: Radiation Oncology

## 2021-12-21 ENCOUNTER — Other Ambulatory Visit: Payer: Self-pay

## 2021-12-21 DIAGNOSIS — C160 Malignant neoplasm of cardia: Secondary | ICD-10-CM | POA: Diagnosis not present

## 2021-12-21 DIAGNOSIS — I2510 Atherosclerotic heart disease of native coronary artery without angina pectoris: Secondary | ICD-10-CM | POA: Diagnosis not present

## 2021-12-21 DIAGNOSIS — Z5112 Encounter for antineoplastic immunotherapy: Secondary | ICD-10-CM | POA: Diagnosis not present

## 2021-12-21 DIAGNOSIS — E039 Hypothyroidism, unspecified: Secondary | ICD-10-CM | POA: Diagnosis not present

## 2021-12-21 DIAGNOSIS — Z510 Encounter for antineoplastic radiation therapy: Secondary | ICD-10-CM | POA: Diagnosis not present

## 2021-12-21 DIAGNOSIS — E119 Type 2 diabetes mellitus without complications: Secondary | ICD-10-CM | POA: Diagnosis not present

## 2021-12-21 DIAGNOSIS — I10 Essential (primary) hypertension: Secondary | ICD-10-CM | POA: Diagnosis not present

## 2021-12-21 DIAGNOSIS — D509 Iron deficiency anemia, unspecified: Secondary | ICD-10-CM | POA: Diagnosis not present

## 2021-12-21 DIAGNOSIS — G4730 Sleep apnea, unspecified: Secondary | ICD-10-CM | POA: Diagnosis not present

## 2021-12-22 ENCOUNTER — Ambulatory Visit
Admission: RE | Admit: 2021-12-22 | Discharge: 2021-12-22 | Disposition: A | Discharge status: Home / Self Care | Payer: Medicare PPO | Source: Ambulatory Visit | Attending: Radiation Oncology | Admitting: Radiation Oncology

## 2021-12-22 ENCOUNTER — Encounter: Payer: Self-pay | Admitting: Radiation Oncology

## 2021-12-22 DIAGNOSIS — E039 Hypothyroidism, unspecified: Secondary | ICD-10-CM | POA: Diagnosis not present

## 2021-12-22 DIAGNOSIS — G4730 Sleep apnea, unspecified: Secondary | ICD-10-CM | POA: Diagnosis not present

## 2021-12-22 DIAGNOSIS — E119 Type 2 diabetes mellitus without complications: Secondary | ICD-10-CM | POA: Diagnosis not present

## 2021-12-22 DIAGNOSIS — Z5112 Encounter for antineoplastic immunotherapy: Secondary | ICD-10-CM | POA: Diagnosis not present

## 2021-12-22 DIAGNOSIS — Z510 Encounter for antineoplastic radiation therapy: Secondary | ICD-10-CM | POA: Diagnosis not present

## 2021-12-22 DIAGNOSIS — D509 Iron deficiency anemia, unspecified: Secondary | ICD-10-CM | POA: Diagnosis not present

## 2021-12-22 DIAGNOSIS — I10 Essential (primary) hypertension: Secondary | ICD-10-CM | POA: Diagnosis not present

## 2021-12-22 DIAGNOSIS — I2510 Atherosclerotic heart disease of native coronary artery without angina pectoris: Secondary | ICD-10-CM | POA: Diagnosis not present

## 2021-12-22 DIAGNOSIS — C160 Malignant neoplasm of cardia: Secondary | ICD-10-CM | POA: Diagnosis not present

## 2021-12-23 ENCOUNTER — Encounter: Payer: Self-pay | Admitting: Oncology

## 2021-12-23 NOTE — Progress Notes (Signed)
Cardiology Office Note:   Date:  12/24/2021  NAME:  Zachary Bowers    MRN: [id number] DOB:  26-Mar-1956   PCP:  Shirline Frees, MD  Cardiologist:  Evalina Field, MD  Electrophysiologist:  None   Referring MD: Shirline Frees, MD   Chief Complaint  Patient presents with   Follow-up    4 months.    History of Present Illness:   Zachary Bowers is a 66 y.o. male with a hx of diabetes, CAD, hypertension, recently diagnosed gastric cancer who presents for follow-up.  Evaluated for chest pain symptoms in August.  Coronary CTA showed three-vessel CAD.  Left heart catheterization with severe RCA disease as well as disease in a second diagonal branch as well as circumflex.  The proximal and distal RCA were intervened upon.  Other lesions managed medically.  2 months later diagnosed with GI bleed and now gastric mass.  Diagnosis is gastric cancer.  Currently on chemotherapy and radiation therapy.  DAPT was stopped 2 months after stent placement due to GI bleeding.    Reports he is having squeezing sensation in his abdomen and chest.  Symptoms occur worse with food.  He reports they last seconds.  They are occurring 1-2 times per day.  He has resumed aspirin.  He has not taken any extra nitroglycerin.  His blood pressure is 112/66.  He has lost roughly 35 to 40 pounds since his diagnosis of gastric cancer.  He still has a gastric mask and is sorting out if surgery will be an option for him.  He still has a considerable bleeding risk.  He did complete 2 months of DAPT.  I believe this is enough given the newer generation stent he has.  He denies any trouble breathing.  He is euvolemic on exam.  We do need to recheck his cholesterol.  All his other numbers seem to be appropriate.  Diabetes is improved with weight loss.  Unfortunately he is losing weight unintentionally.  Problem List CAD -+CCTA -LHC 08/11/2021:  PCI to pRCA/dRCA -50% mid LAD -75% D2 -75% LCX 2. DM -A1c 6.8 3. HTN 4. HLD -T chol  140, HDL 31, LDL 82, TG 155 5. OSA 6. Gastric Cancer/GI bleed -DAPT stopped 10/11/2021 -on chemo/radiation   Past Medical History: Past Medical History:  Diagnosis Date   Coronary artery disease    Diabetes mellitus without complication ([place])    Hyperlipidemia    Hypertension    Hypogonadism in male    OSA (obstructive sleep apnea)    Stomach cancer ([place])    Thyroid disease     Past Surgical History: Past Surgical History:  Procedure Laterality Date   APPENDECTOMY     APPENDECTOMY Right    BIOPSY  10/09/2021   Procedure: BIOPSY;  Surgeon: Wilford Corner, MD;  Location: [facility];  Service: Endoscopy;;   CARDIAC CATHETERIZATION     CORONARY STENT INTERVENTION N/A 08/11/2021   Procedure: CORONARY STENT INTERVENTION;  Surgeon: Jettie Booze, MD;  Location: [facility];  Service: Cardiovascular;  Laterality: N/A;   ESOPHAGOGASTRODUODENOSCOPY (EGD) WITH PROPOFOL N/A 10/09/2021   Procedure: ESOPHAGOGASTRODUODENOSCOPY (EGD) WITH PROPOFOL;  Surgeon: Wilford Corner, MD;  Location: [facility];  Service: Endoscopy;  Laterality: N/A;   INTRAVASCULAR ULTRASOUND/IVUS N/A 08/11/2021   Procedure: Intravascular Ultrasound/IVUS;  Surgeon: Jettie Booze, MD;  Location: [facility];  Service: Cardiovascular;  Laterality: N/A;   LEFT HEART CATH AND CORONARY ANGIOGRAPHY N/A 08/11/2021   Procedure: LEFT HEART CATH AND CORONARY  ANGIOGRAPHY;  Surgeon: Jettie Booze, MD;  Location: [facility];  Service: Cardiovascular;  Laterality: N/A;    Current Medications: Current Meds  Medication Sig   amLODipine (NORVASC) 10 MG tablet Take 10 mg by mouth daily.   aspirin 81 MG tablet Take 81 mg by mouth daily.   atorvastatin (LIPITOR) 80 MG tablet Take 1 tablet (80 mg total) by mouth daily.   dexamethasone (DECADRON) 2 MG tablet Take 5 tablets (10 mg total) by mouth as directed. Take 5 tablets at 10 pm night before first treatment Take 5 tablets at 6 am  morning of first treatment   dexamethasone (DECADRON) 2 MG tablet Take 5 tablets (10 mg total) by mouth 2 (two) times daily with a meal. Take 5 tabs at 10 pm night before chemo. And take 5 tablets morning of chemo   ferrous sulfate 325 (65 FE) MG tablet Take 325 mg by mouth 2 (two) times daily.   glimepiride (AMARYL) 4 MG tablet Take 4 mg by mouth every morning.   glucose blood test strip 1 each by Other route. Check blood sugar every morning   levothyroxine (SYNTHROID, LEVOTHROID) 125 MCG tablet Take 125 mcg by mouth daily before breakfast.    metFORMIN (GLUCOPHAGE) 500 MG tablet Take 500-1,000 mg by mouth See admin instructions. 1000 mg in the morning 500 mg with dinner as needed if sugar is low   prochlorperazine (COMPAZINE) 10 MG tablet Take 1 tablet (10 mg total) by mouth every 6 (six) hours as needed.   sucralfate (CARAFATE) 1 GM/10ML suspension    telmisartan (MICARDIS) 40 MG tablet Take 40 mg by mouth daily.     Allergies:    Niaspan [niacin er]   Social History: Social History   Socioeconomic History   Marital status: Married    Spouse name: Not on file   Number of children: 2   Years of education: Not on file   Highest education level: Not on file  Occupational History   Occupation: Retired Works with [employer]  Tobacco Use   Smoking status: Former   Smokeless tobacco: Former    Types: Chew  Substance and Sexual Activity   Alcohol use: No   Drug use: No   Sexual activity: Not on file  Other Topics Concern   Not on file  Social History Narrative   Not on file   Social Determinants of Health   Financial Resource Strain: Not on file  Food Insecurity: Not on file  Transportation Needs: Not on file  Physical Activity: Not on file  Stress: Not on file  Social Connections: Not on file     Family History: The patient's family history includes Alzheimer's disease in his father; Heart disease in his mother; Stomach cancer in his maternal uncle.  ROS:   All other ROS  reviewed and negative. Pertinent positives noted in the HPI.     EKGs/Labs/Other Studies Reviewed:   The following studies were personally reviewed by me today:  CCTA 08/06/2021 IMPRESSION: 1. Coronary calcium score of 1665. This was 99th percentile for age-, race-, and sex-matched controls.   2. Normal coronary origin with right dominance.   3. There is severe (>70%) plaque in the RCA and moderate (50-69%) plaque int he LAD and RI. CAD-RADS 4.   4. Will send study for FFRct.  FFT CT 08/07/2021 IMPRESSION: FFRct findings are consistent with significant obstruciton in the LAD, D1, RCA, and OM1.   Recommend cardiac catheterization.  [place] 08/11/2021  Ramus lesion is 80% stenosed.   Dist Cx lesion is 75% stenosed.   Mid LAD lesion is 50% stenosed.   2nd Diag lesion is 75% stenosed.   Prox Cx lesion is 25% stenosed.   Ost RCA lesion is 25% stenosed.   Prox RCA lesion is 95% stenosed.  A drug-eluting stent was successfully placed using a STENT ONYX FRONTIER 3.5X18, postdilated to 3.75 mm and optimized with intravascular ultrasound.   Post intervention, there is a 0% residual stenosis.   Dist RCA lesion is 80% stenosed.  A drug-eluting stent was successfully placed using a STENT ONYX FRONTIER 3.5X15, postdilated to 3.75 mm and optimized by intravascular ultrasound.   A drug-eluting stent was successfully placed using a STENT ONYX FRONTIER 3.5X18.   A drug-eluting stent was successfully placed using a STENT ONYX FRONTIER 3.5X15.   Post intervention, there is a 0% residual stenosis.   The left ventricular systolic function is normal.   LV end diastolic pressure is normal.   The left ventricular ejection fraction is 55-65% by visual estimate.   There is no aortic valve stenosis.  TTE 08/19/2021  1. Left ventricular ejection fraction, by estimation, is 60 to 65%. The  left ventricle has normal function. The left ventricle has no regional  wall motion abnormalities. Left ventricular  diastolic parameters are  consistent with Grade I diastolic  dysfunction (impaired relaxation).   2. Right ventricular systolic function is normal. The right ventricular  size is normal.   3. The mitral valve is normal in structure. No evidence of mitral valve  regurgitation. No evidence of mitral stenosis.   4. The aortic valve is tricuspid. Aortic valve regurgitation is mild. No  aortic stenosis is present.   5. The inferior vena cava is normal in size with greater than 50%  respiratory variability, suggesting right atrial pressure of 3 mmHg.   Recent Labs: 12/17/2021: ALT 15; BUN 11; Creatinine 0.87; Hemoglobin 10.7; Platelet Count 194; Potassium 3.6; Sodium 138   Recent Lipid Panel No results found for: CHOL, TRIG, HDL, CHOLHDL, VLDL, LDLCALC, LDLDIRECT  Physical Exam:   VS:  BP 112/66 (BP Location: Left Arm, Patient Position: Sitting, Cuff Size: Large)    Pulse 80    Ht 6' (1.829 m)    Wt 215 lb (97.5 kg)    BMI 29.16 kg/m    Wt Readings from Last 3 Encounters:  12/24/21 215 lb (97.5 kg)  12/17/21 219 lb 6.4 oz (99.5 kg)  12/04/21 231 lb (104.8 kg)    General: Well nourished, well developed, in no acute distress Head: Atraumatic, normal size  Eyes: PEERLA, EOMI  Neck: Supple, no JVD Endocrine: No thryomegaly Cardiac: Normal S1, S2; RRR; no murmurs, rubs, or gallops Lungs: Clear to auscultation bilaterally, no wheezing, rhonchi or rales  Abd: Soft, nontender, no hepatomegaly  Ext: No edema, pulses 2+ Musculoskeletal: No deformities, BUE and BLE strength normal and equal Skin: Warm and dry, no rashes   Neuro: Alert and oriented to person, place, time, and situation, CNII-XII grossly intact, no focal deficits  Psych: Normal mood and affect   ASSESSMENT:   Zachary Bowers is a 66 y.o. male who presents for the following: 1. Coronary artery disease of native artery of native heart with stable angina pectoris ([place])   2. Mixed hyperlipidemia   3. Iron deficiency anemia due to  chronic blood loss     PLAN:   1. Coronary artery disease of native artery of native heart with stable angina pectoris (  [place] 2. Mixed hyperlipidemia -Evaluated with chest pain symptoms.  Coronary CTA was positive.  Left heart catheterization showed diffuse three-vessel CAD.  Underwent PCI to the proximal and distal RCA.  He does have residual disease 50% in the mid LAD, 75% and a second diagonal, 80% in the ramus, 75% the distal circumflex.  This has been managed medically. -Unfortunately he was diagnosed with stomach cancer recently.  He has completed chemotherapy and radiation.  He was diagnosed with a GI bleed.  DAPT was stopped after 2 months.  Unfortunately he is too high risk to resume this.  I believe this is likely acceptable.  He has gotten most of the benefit in the setting of newer generation stents.  He will just continue aspirin 81 mg daily.  No further bleeding. -He continues to describe symptoms and squeezing in his chest and abdomen.  I believe this is related to his gastric cancer.  He reports food makes it worse.  He really cannot eat anything.  He has lost roughly 35 pounds. -We discussed that medical management is recommended for the other lesions.  He will continue Norvasc 10 mg daily, Imdur 30 mg daily. -He is on Lipitor 80 mg daily.  We will recheck a lipid profile today. -He will continue aspirin 81 mg daily.  Should he have further bleeding or other issues we will stop this.  Again we are not pursuing PCI for other lesions as he is too high of a bleeding risk.  We will just continue with medical therapy for now.  Echo is reassuring with normal LV function.  3. Iron deficiency anemia due to chronic blood loss -Diagnosed with GI bleed found to have gastric cancer.  Status post chemotherapy and radiation.  Awaiting possible surgical intervention.  Only on aspirin.  Did complete 2 months of DAPT given recent PCI.  Unfortunately cannot be on DAPT now.  We would have preferred to go 6  months but 2 months is acceptable.  Disposition: Return in about 4 months (around 04/23/2022).  Medication Adjustments/Labs and Tests Ordered: Current medicines are reviewed at length with the patient today.  Concerns regarding medicines are outlined above.  Orders Placed This Encounter  Procedures   Lipid panel   No orders of the defined types were placed in this encounter.   Patient Instructions  Medication Instructions:  Your physician recommends that you continue on your current medications as directed. Please refer to the Current Medication list given to you today.  *If you need a refill on your cardiac medications before your next appointment, please call your pharmacy*   Lab Work: Your physician recommends that you return for lab work in:  TODAY: Lipids If you have labs (blood work) drawn today and your tests are completely normal, you will receive your results only by: [place] (if you have [place]) OR A paper copy in the mail If you have any lab test that is abnormal or we need to change your treatment, we will call you to review the results.   Testing/Procedures: None   Follow-Up: At [facility], you and your health needs are our priority.  As part of our continuing mission to provide you with exceptional heart care, we have created designated Provider Care Teams.  These Care Teams include your primary Cardiologist (physician) and Advanced Practice Providers (APPs -  Physician Assistants and Nurse Practitioners) who all work together to provide you with the care you need, when you need it.  We recommend signing up  for the patient portal called "MyChart".  Sign up information is provided on this After Visit Summary.  MyChart is used to connect with patients for Virtual Visits (Telemedicine).  Patients are able to view lab/test results, encounter notes, upcoming appointments, etc.  Non-urgent messages can be sent to your provider as well.   To learn more about  what you can do with MyChart, go to [email or web address].    Your next appointment:   4 month(s)  The format for your next appointment:   In Person  Provider:   Evalina Field, MD     Other Instructions     Time Spent with Patient: I have spent a total of 35 minutes with patient reviewing hospital notes, telemetry, EKGs, labs and examining the patient as well as establishing an assessment and plan that was discussed with the patient.  > 50% of time was spent in direct patient care.  Signed, Addison Naegeli. Audie Box, MD, [place]  [address], [place], [place] [address] [phone]  12/24/2021 9:19 AM

## 2021-12-23 NOTE — Progress Notes (Signed)
° °                                                                                                                                                          °  Patient Name: Zachary Bowers MRN: [id number] DOB: 1956/04/08 Referring Physician: Betsy Coder (Profile Not Attached) Date of Service: 12/22/2021 [facility], [place]                                                        End Of Treatment Note  Diagnoses: C16.0-Malignant neoplasm of cardia  Cancer Staging: Stage III, cT3N1M0, adenocarcinoma of the gastric cardia.  Intent: Curative  Radiation Treatment Dates: 11/11/2021 through 12/22/2021 Site Technique Total Dose (Gy) Dose per Fx (Gy) Completed Fx Beam Energies  Esophagus/Stomach:   IMRT 45/45 1.8 25/25 6X  Esophagus/Stomach: Esoph_Bst IMRT 5.4/5.4 1.8 3/3 6X   Narrative: The patient tolerated radiation therapy relatively well. He developed fatigue and esophagitis that responded to Mylanta.  Plan: The patient will receive a call in about one month from the radiation oncology department. He will continue follow up with Dr. Benay Spice and Dr. Kipp Brood.    ________________________________________________    Carola Rhine, [facility]

## 2021-12-24 ENCOUNTER — Ambulatory Visit: Payer: Medicare PPO | Admitting: Cardiovascular Disease

## 2021-12-24 ENCOUNTER — Other Ambulatory Visit: Payer: Self-pay

## 2021-12-24 ENCOUNTER — Encounter: Payer: Self-pay | Admitting: Cardiovascular Disease

## 2021-12-24 VITALS — BP 112/66 | HR 80 | Ht 72.0 in | Wt 215.0 lb

## 2021-12-24 DIAGNOSIS — D500 Iron deficiency anemia secondary to blood loss (chronic): Secondary | ICD-10-CM

## 2021-12-24 DIAGNOSIS — I25118 Atherosclerotic heart disease of native coronary artery with other forms of angina pectoris: Secondary | ICD-10-CM

## 2021-12-24 DIAGNOSIS — E782 Mixed hyperlipidemia: Secondary | ICD-10-CM | POA: Diagnosis not present

## 2021-12-24 LAB — LIPID PANEL
Chol/HDL Ratio: 3.9 ratio (ref 0.0–5.0)
Cholesterol, Total: 118 mg/dL (ref 100–199)
HDL: 30 mg/dL — ABNORMAL LOW (ref >39)
LDL Chol Calc (NIH): 67 mg/dL (ref 0–99)
Triglycerides: 116 mg/dL (ref 0–149)
VLDL Cholesterol Cal: 21 mg/dL (ref 5–40)

## 2021-12-24 NOTE — Patient Instructions (Signed)
Medication Instructions:  Your physician recommends that you continue on your current medications as directed. Please refer to the Current Medication list given to you today.  *If you need a refill on your cardiac medications before your next appointment, please call your pharmacy*   Lab Work: Your physician recommends that you return for lab work in:  TODAY: Lipids If you have labs (blood work) drawn today and your tests are completely normal, you will receive your results only by: [place] (if you have [place]) OR A paper copy in the mail If you have any lab test that is abnormal or we need to change your treatment, we will call you to review the results.   Testing/Procedures: None   Follow-Up: At [facility], you and your health needs are our priority.  As part of our continuing mission to provide you with exceptional heart care, we have created designated Provider Care Teams.  These Care Teams include your primary Cardiologist (physician) and Advanced Practice Providers (APPs -  Physician Assistants and Nurse Practitioners) who all work together to provide you with the care you need, when you need it.  We recommend signing up for the patient portal called "MyChart".  Sign up information is provided on this After Visit Summary.  MyChart is used to connect with patients for Virtual Visits (Telemedicine).  Patients are able to view lab/test results, encounter notes, upcoming appointments, etc.  Non-urgent messages can be sent to your provider as well.   To learn more about what you can do with MyChart, go to [email or web address].    Your next appointment:   4 month(s)  The format for your next appointment:   In Person  Provider:   Evalina Field, MD     Other Instructions

## 2022-01-04 ENCOUNTER — Other Ambulatory Visit: Payer: Self-pay

## 2022-01-04 ENCOUNTER — Inpatient Hospital Stay: Payer: Medicare PPO

## 2022-01-04 ENCOUNTER — Inpatient Hospital Stay: Payer: Medicare PPO | Admitting: Oncology

## 2022-01-04 VITALS — BP 130/84 | HR 84 | Temp 97.8°F | Resp 18 | Ht 72.0 in | Wt 216.8 lb

## 2022-01-04 DIAGNOSIS — Z510 Encounter for antineoplastic radiation therapy: Secondary | ICD-10-CM | POA: Diagnosis not present

## 2022-01-04 DIAGNOSIS — E039 Hypothyroidism, unspecified: Secondary | ICD-10-CM | POA: Diagnosis not present

## 2022-01-04 DIAGNOSIS — E119 Type 2 diabetes mellitus without complications: Secondary | ICD-10-CM | POA: Diagnosis not present

## 2022-01-04 DIAGNOSIS — D509 Iron deficiency anemia, unspecified: Secondary | ICD-10-CM | POA: Diagnosis not present

## 2022-01-04 DIAGNOSIS — I2510 Atherosclerotic heart disease of native coronary artery without angina pectoris: Secondary | ICD-10-CM | POA: Diagnosis not present

## 2022-01-04 DIAGNOSIS — Z5112 Encounter for antineoplastic immunotherapy: Secondary | ICD-10-CM | POA: Diagnosis not present

## 2022-01-04 DIAGNOSIS — C160 Malignant neoplasm of cardia: Secondary | ICD-10-CM | POA: Diagnosis not present

## 2022-01-04 DIAGNOSIS — I10 Essential (primary) hypertension: Secondary | ICD-10-CM | POA: Diagnosis not present

## 2022-01-04 DIAGNOSIS — G4730 Sleep apnea, unspecified: Secondary | ICD-10-CM | POA: Diagnosis not present

## 2022-01-04 LAB — CBC WITH DIFFERENTIAL (CANCER CENTER ONLY)
Abs Immature Granulocytes: 0.01 10*3/uL (ref 0.00–0.07)
Basophils Absolute: 0 10*3/uL (ref 0.0–0.1)
Basophils Relative: 1 %
Eosinophils Absolute: 0.1 10*3/uL (ref 0.0–0.5)
Eosinophils Relative: 4 %
HCT: 34.1 % — ABNORMAL LOW (ref 39.0–52.0)
Hemoglobin: 10.9 g/dL — ABNORMAL LOW (ref 13.0–17.0)
Immature Granulocytes: 0 %
Lymphocytes Relative: 46 %
Lymphs Abs: 1.3 10*3/uL (ref 0.7–4.0)
MCH: 24.9 pg — ABNORMAL LOW (ref 26.0–34.0)
MCHC: 32 g/dL (ref 30.0–36.0)
MCV: 77.9 fL — ABNORMAL LOW (ref 80.0–100.0)
Monocytes Absolute: 0.5 10*3/uL (ref 0.1–1.0)
Monocytes Relative: 19 %
Neutro Abs: 0.9 10*3/uL — ABNORMAL LOW (ref 1.7–7.7)
Neutrophils Relative %: 30 %
Platelet Count: 221 10*3/uL (ref 150–400)
RBC: 4.38 MIL/uL (ref 4.22–5.81)
RDW: 21.2 % — ABNORMAL HIGH (ref 11.5–15.5)
WBC Count: 2.9 10*3/uL — ABNORMAL LOW (ref 4.0–10.5)
nRBC: 0 % (ref 0.0–0.2)

## 2022-01-04 NOTE — Progress Notes (Signed)
[facility] OFFICE PROGRESS NOTE   Diagnosis: Gastric cancer  INTERVAL HISTORY:   Zachary Bowers completed radiation 12/22/2021.  He was last treated with Taxol/carboplatin 12/18/2021.  He reports intermittent dysphagia.  No bleeding.  He takes iron intermittently.  Objective:  Vital signs in last 24 hours:  Blood pressure 130/84, pulse 84, temperature 97.8 F (36.6 C), temperature source Oral, resp. rate 18, height 6' (1.829 m), weight 216 lb 12.8 oz (98.3 kg), SpO2 100 %.    Lymphatics: No cervical, supraclavicular, axillary, or inguinal nodes Resp: Lungs clear bilaterally Cardio: Regular rate and rhythm GI: No hepatosplenomegaly Vascular: No leg edema Rectal: No anal or rectal mass, brown stool, the prostate is enlarged   Lab Results:  Lab Results  Component Value Date   WBC 2.9 (L) 01/04/2022   HGB 10.9 (L) 01/04/2022   HCT 34.1 (L) 01/04/2022   MCV 77.9 (L) 01/04/2022   PLT 221 01/04/2022   NEUTROABS 0.9 (L) 01/04/2022    CMP  Lab Results  Component Value Date   NA 138 12/17/2021   K 3.6 12/17/2021   CL 102 12/17/2021   CO2 26 12/17/2021   GLUCOSE 193 (H) 12/17/2021   BUN 11 12/17/2021   CREATININE 0.87 12/17/2021   CALCIUM 9.5 12/17/2021   PROT 7.3 12/17/2021   ALBUMIN 4.2 12/17/2021   AST 12 (L) 12/17/2021   ALT 15 12/17/2021   ALKPHOS 88 12/17/2021   BILITOT 0.5 12/17/2021   GFRNONAA >60 12/17/2021    Lab Results  Component Value Date   CEA 1.50 10/30/2021    Medications: I have reviewed the patient's current medications.   Assessment/Plan: Gastric cancer CT abdomen/pelvis 10/08/2021-probable mass in the proximal stomach, enlarged gastropathic ligament node Upper endoscopy 10/01/2021-gastric cardia/fundus mass with oozing and stigmata of recent bleeding-biopsy at least intramucosal adenocarcinoma, mismatch repair protein expression intact, PD-L1 combined positive score 7%, HER2 positive PET 10/20/2021-hypermetabolic proximal  gastric primary, or hypermetabolic gastropathic ligament node, right paramidline prostatic hypermetabolism, anal hypermetabolism without a CT mass Radiation 11/11/2021-12/22/2021 Cycle 1  Taxol/carboplatin 11/12/2021 Cycle 2 Taxol/carboplatin 11/19/2021 Cycle 3 Taxol/carboplatin 11/26/2021 Cycle 4 Taxol/carboplatin 12/03/2021 Cycle 5 Taxol/carboplatin 12/18/2021  Microcytic anemia secondary to #1 Diabetes Hypertension Coronary artery disease, cardiac catheterization 08/11/2021-placement of proximal and distal right coronary artery stents Hypothyroidism Sleep apnea Anal hypermetabolism on the PET 10/20/2021-no mass identified    Disposition: Zachary Bowers has completed concurrent chemotherapy and radiation.  He will follow-up with Dr. Kipp Brood and Dr. Zenia Resides for surgical planning.  He will be scheduled for a restaging PET scan and return for an office visit in approximately 2 weeks.  Betsy Coder, MD  01/04/2022  8:53 AM

## 2022-01-05 ENCOUNTER — Telehealth: Payer: Self-pay | Admitting: *Deleted

## 2022-01-05 NOTE — Telephone Encounter (Signed)
Notified of PET at [facility] on 2/6 at 1000 w/arrival at 0930. NPO except water x 6 hours. Hold am metformin and limit carbs at supper night before.

## 2022-01-05 NOTE — Telephone Encounter (Signed)
Patient has declined to proceed with sleep therapy because he is actively dealing with cancer.

## 2022-01-08 ENCOUNTER — Ambulatory Visit (INDEPENDENT_AMBULATORY_CARE_PROVIDER_SITE_OTHER): Payer: Medicare PPO | Admitting: Thoracic Surgery (Cardiothoracic Vascular Surgery)

## 2022-01-08 ENCOUNTER — Other Ambulatory Visit: Payer: Self-pay

## 2022-01-08 VITALS — BP 132/80 | HR 86 | Resp 20 | Ht 72.0 in | Wt 216.0 lb

## 2022-01-08 DIAGNOSIS — C160 Malignant neoplasm of cardia: Secondary | ICD-10-CM

## 2022-01-08 NOTE — Progress Notes (Signed)
° °   °  [address]       [place] [address]             [phone]        Gionni Labrum [place] Medical Record #[id number] Date of Birth: [birth year]  Referring: [name], MD Primary Care: Shirline Frees, MD Primary Cardiologist:[place] Doreatha Lew, MD  Reason for visit:   follow-up  History of Present Illness:     [name] comes in to discuss further surgical planning.  He recently completed his adjuvant therapy for his gastric cancer.  He is down to 216 pounds.  He tolerated his adjuvant therapy without much complication.  His appetite is improved.  Physical Exam: BP 132/80    Pulse 86    Resp 20    Ht 6' (1.829 m)    Wt 216 lb (98 kg)    SpO2 98% Comment: RA   BMI 29.29 kg/m   Alert NAD Abdomen  ND No peripheral edema      Assessment / Plan:   66 year old male with primary gastric cancer involving the cardia of the stomach.  He completed his radiation therapy as of December 22, 2021.  He is scheduled to undergo reimaging with a PET/CT in February.  I will see him back on February 17 to assess his plans for undergoing surgery.   Lajuana Matte 01/08/2022 12:31 PM

## 2022-01-11 ENCOUNTER — Ambulatory Visit
Admission: RE | Admit: 2022-01-11 | Discharge: 2022-01-11 | Disposition: A | Discharge status: Home / Self Care | Payer: Medicare PPO | Source: Ambulatory Visit | Attending: Oncology | Admitting: Oncology

## 2022-01-11 DIAGNOSIS — C160 Malignant neoplasm of cardia: Secondary | ICD-10-CM

## 2022-01-11 NOTE — Progress Notes (Signed)
°  Radiation Oncology         [phone] ________________________________  Name: Zachary Bowers MRN: [id number]  Date of Service: 01/11/2022  DOB: 1956/10/27  Post Treatment Telephone Note  Diagnosis:   Stage III, cT3N1M0, adenocarcinoma of the gastric cardia.  Intent: Curative  Radiation Treatment Dates: 11/11/2021 through 12/22/2021 Site Technique Total Dose (Gy) Dose per Fx (Gy) Completed Fx Beam Energies  Esophagus/Stomach:   IMRT 45/45 1.8 25/25 6X  Esophagus/Stomach: Esoph_Bst IMRT 5.4/5.4 1.8 3/3 6X   Narrative: The patient tolerated radiation therapy relatively well. He developed fatigue and esophagitis that responded to Mylanta. Since finishing radiation his symptoms have improved and he is not having the same level of esophageal pain.    Impression/Plan: 1. Stage III, cT3N1M0, adenocarcinoma of the gastric cardia. The patient has been doing well since completion of radiotherapy. We discussed that we would be happy to continue to follow him as needed, but he will also continue to follow up with Drs. Sherrill and [employer].      Carola Rhine, PAC

## 2022-01-18 ENCOUNTER — Ambulatory Visit: Payer: Medicare PPO

## 2022-01-18 ENCOUNTER — Encounter (HOSPITAL_COMMUNITY)
Admission: RE | Admit: 2022-01-18 | Discharge: 2022-01-18 | Disposition: A | Discharge status: Home / Self Care | Payer: Medicare PPO | Source: Ambulatory Visit | Attending: Oncology | Admitting: Oncology

## 2022-01-18 ENCOUNTER — Other Ambulatory Visit: Payer: Self-pay

## 2022-01-18 DIAGNOSIS — I700 Atherosclerosis of aorta: Secondary | ICD-10-CM | POA: Diagnosis not present

## 2022-01-18 DIAGNOSIS — R911 Solitary pulmonary nodule: Secondary | ICD-10-CM | POA: Insufficient documentation

## 2022-01-18 DIAGNOSIS — I2510 Atherosclerotic heart disease of native coronary artery without angina pectoris: Secondary | ICD-10-CM | POA: Insufficient documentation

## 2022-01-18 DIAGNOSIS — C160 Malignant neoplasm of cardia: Secondary | ICD-10-CM | POA: Insufficient documentation

## 2022-01-18 LAB — GLUCOSE, CAPILLARY: Glucose-Capillary: 162 mg/dL — ABNORMAL HIGH (ref 70–99)

## 2022-01-18 MED ORDER — FLUDEOXYGLUCOSE F - 18 (FDG) INJECTION
11.0000 | Freq: Once | INTRAVENOUS | Status: AC | PRN
  Administered 2022-01-18: 10.74 via INTRAVENOUS

## 2022-01-19 ENCOUNTER — Ambulatory Visit: Payer: Self-pay | Admitting: Surgery

## 2022-01-19 DIAGNOSIS — C161 Malignant neoplasm of fundus of stomach: Secondary | ICD-10-CM | POA: Diagnosis not present

## 2022-01-19 DIAGNOSIS — C160 Malignant neoplasm of cardia: Secondary | ICD-10-CM

## 2022-01-19 NOTE — H&P (Signed)
History of Present Illness: Zachary Bowers is a 66 y.o. 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 After discussion at [facility], the decision was made to treat as a GEJ cancer, and he completed neoadjuvant chemoradiation (taxol/carboplatin completed 12/18/21, radiation completed 12/22/21). He had a restaging PET/CT done yesterday, which showed a partial response of his primary tumor to treatment and no evidence of distant metastatic disease. Today he says he is overall feeling well. He had some epigastric and chest pain during radiation, but this has improved. He lost about 30 pounds during treatment, but he is now eating more and his weight today is stable compared to weight 2 weeks ago. He has seen Dr. Kipp Brood and is scheduled to see him again on 2/17.   He had cardiac stents placed last August, and Plavix was stopped two months later due to bleeding. He remains on aspirin alone. His last echo showed normal function, and in October his cardiologist recommended no further preoperative workup, however he will be at increased risk for periop cardiac complications.     Review of Systems: A complete review of systems was obtained from the patient.  I have reviewed this information and discussed as appropriate with the patient.  See HPI as well for other ROS.     Medical History: Past Medical History Past Medical History: Diagnosis Date  Coronary artery disease    Diabetes mellitus without complication (CMS-HCC)    Hyperlipidemia    Hypertension    Sleep apnea        Patient Active Problem List Diagnosis  Malignant neoplasm of fundus of stomach (CMS-HCC)  Acute blood loss anemia     Past Surgical History Past Surgical History: Procedure Laterality Date  APPENDECTOMY          Allergies Allergies Allergen Reactions  Niacin Itching      Current Outpatient Medications on File Prior to Visit Medication Sig Dispense Refill  amLODIPine  (NORVASC) 10 MG tablet Take 1 tablet (10 mg total) by mouth once daily      atorvastatin (LIPITOR) 80 MG tablet Take 1 tablet (80 mg total) by mouth once daily      glimepiride (AMARYL) 4 MG tablet TAKE 1 TABLET BY MOUTH EVERY DAY WITH BREAKFAST OR THE FIRST MAIN MEAL OF THE DAY      isosorbide mononitrate (IMDUR) 30 MG ER tablet 1 tablet in the morning      metFORMIN (GLUCOPHAGE) 500 MG tablet 2 tablets in am      omeprazole (PRILOSEC) 40 MG DR capsule 1 capsule 30 minutes before morning meal      sucralfate (CARAFATE) 100 mg/mL suspension 10 mL on an empty stomach      telmisartan (MICARDIS) 40 MG tablet Take 1 tablet (40 mg total) by mouth once daily       No current facility-administered medications on file prior to visit.     Family History Family History Problem Relation Age of Onset  High blood pressure (Hypertension) Mother    Coronary Artery Disease (Blocked arteries around heart) Mother    Diabetes Mother    Deep vein thrombosis (DVT or abnormal blood clot formation) Mother    High blood pressure (Hypertension) Brother    Coronary Artery Disease (Blocked arteries around heart) Brother    Deep vein thrombosis (DVT or abnormal blood clot formation) Brother        Social History   Tobacco Use Smoking Status Former  Types: Cigarettes  Quit date: 40  Years since quitting: 47.1 Smokeless Tobacco Never     Social History Social History    Socioeconomic History  Marital status: Married Tobacco Use  Smoking status: Former     Types: Cigarettes     Quit date: 1976     Years since quitting: 53.1  Smokeless tobacco: Never Vaping Use  Vaping Use: Never used Substance and Sexual Activity  Alcohol use: Not Currently  Drug use: Defer  Sexual activity: Defer      Objective:     Vitals:   01/19/22 1058 BP: 110/80 Pulse: 88 Temp: 36.8 C (98.2 F) SpO2: 98% Weight: 98.5 kg (217 lb 3.2 oz) Height: 182.9 cm (6')   Body mass index is 29.46 kg/m.   Physical  Exam Vitals reviewed.  Constitutional:      General: He is not in acute distress.    Appearance: Normal appearance.  HENT:     Head: Normocephalic and atraumatic.  Eyes:     General: No scleral icterus.    Conjunctiva/sclera: Conjunctivae normal.  Cardiovascular:     Rate and Rhythm: Normal rate and regular rhythm.     Heart sounds: No murmur heard. Pulmonary:     Effort: Pulmonary effort is normal. No respiratory distress.     Breath sounds: Normal breath sounds.  Abdominal:     General: Abdomen is flat. There is no distension.     Tenderness: There is no abdominal tenderness.  Musculoskeletal:        General: No swelling or deformity. Normal range of motion.     Cervical back: Normal range of motion.  Skin:    General: Skin is warm and dry.     Coloration: Skin is not jaundiced.  Neurological:     General: No focal deficit present.     Mental Status: He is alert and oriented to person, place, and time.  Psychiatric:        Mood and Affect: Mood normal.        Behavior: Behavior normal.        Thought Content: Thought content normal.          Labs, Imaging and Diagnostic Testing:   PET/CT 01/18/22: IMPRESSION: 1. Partial response to therapy of gastric cardia primary and adjacent gastrohepatic ligament nodal metastasis. 2. No new or progressive disease. 3. Similar tiny left lower lobe pulmonary nodule laterally. There may be a new more medial subpleural left lower lobe 4 mm nodule. These are both below PET resolution. Recommend attention on follow-up. 4. Decrease in diffuse thyroid hypermetabolism which again can be seen with thyroiditis.     Assessment and Plan:    Diagnoses and all orders for this visit:   Malignant neoplasm of fundus of stomach (CMS-HCC)       This is a 66 yo male with adenocarcinoma of the gastric cardia, s/p chemoradiation. Restaging PET shows partial treatment response with no progressive or metastatic disease. This is amenable to  surgical resection via total gastrectomy with esophagojejunostomy. Patient will require a feeding J tube for postop nutrition. As the tumor may involve the GE junction, he may require a distal esophagectomy as well, and this will be a joint case with Dr. Kipp Brood. I have discussed the procedure details with the patient and his wife and reviewed the risks, including bleeding, infection, anastomotic leak, and perioperative MI.  He is at increased risk for cardiac complications, per his cardiologist no further workup is needed to further optimize him for surgery.  He will be in the hospital for about 1 week and can anticipate tube feed dependence for several weeks to months postoperatively. He should continue aspirin throughout the perioperative period and does not need to hold this, which I reviewed with him today. I anticipate scheduling surgery within about 8 weeks after completion of his radiation, in either late February or early March, will coordinate this with Dr. Kipp Brood.  Michaelle Birks, [facility], Hepatobiliary and Pancreatic Surgery 01/19/22 11:42 AM

## 2022-01-19 NOTE — H&P (View-Only) (Signed)
History of Present Illness: Zachary Bowers is a 67 y.o. 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 After discussion at [facility], the decision was made to treat as a GEJ cancer, and he completed neoadjuvant chemoradiation (taxol/carboplatin completed 12/18/21, radiation completed 12/22/21). He had a restaging PET/CT done yesterday, which showed a partial response of his primary tumor to treatment and no evidence of distant metastatic disease. Today he says he is overall feeling well. He had some epigastric and chest pain during radiation, but this has improved. He lost about 30 pounds during treatment, but he is now eating more and his weight today is stable compared to weight 2 weeks ago. He has seen Dr. Kipp Brood and is scheduled to see him again on 2/17.   He had cardiac stents placed last August, and Plavix was stopped two months later due to bleeding. He remains on aspirin alone. His last echo showed normal function, and in October his cardiologist recommended no further preoperative workup, however he will be at increased risk for periop cardiac complications.     Review of Systems: A complete review of systems was obtained from the patient.  I have reviewed this information and discussed as appropriate with the patient.  See HPI as well for other ROS.     Medical History: Past Medical History Past Medical History: Diagnosis Date  Coronary artery disease    Diabetes mellitus without complication (CMS-HCC)    Hyperlipidemia    Hypertension    Sleep apnea        Patient Active Problem List Diagnosis  Malignant neoplasm of fundus of stomach (CMS-HCC)  Acute blood loss anemia     Past Surgical History Past Surgical History: Procedure Laterality Date  APPENDECTOMY          Allergies Allergies Allergen Reactions  Niacin Itching      Current Outpatient Medications on File Prior to Visit Medication Sig Dispense Refill  amLODIPine  (NORVASC) 10 MG tablet Take 1 tablet (10 mg total) by mouth once daily      atorvastatin (LIPITOR) 80 MG tablet Take 1 tablet (80 mg total) by mouth once daily      glimepiride (AMARYL) 4 MG tablet TAKE 1 TABLET BY MOUTH EVERY DAY WITH BREAKFAST OR THE FIRST MAIN MEAL OF THE DAY      isosorbide mononitrate (IMDUR) 30 MG ER tablet 1 tablet in the morning      metFORMIN (GLUCOPHAGE) 500 MG tablet 2 tablets in am      omeprazole (PRILOSEC) 40 MG DR capsule 1 capsule 30 minutes before morning meal      sucralfate (CARAFATE) 100 mg/mL suspension 10 mL on an empty stomach      telmisartan (MICARDIS) 40 MG tablet Take 1 tablet (40 mg total) by mouth once daily       No current facility-administered medications on file prior to visit.     Family History Family History Problem Relation Age of Onset  High blood pressure (Hypertension) Mother    Coronary Artery Disease (Blocked arteries around heart) Mother    Diabetes Mother    Deep vein thrombosis (DVT or abnormal blood clot formation) Mother    High blood pressure (Hypertension) Brother    Coronary Artery Disease (Blocked arteries around heart) Brother    Deep vein thrombosis (DVT or abnormal blood clot formation) Brother        Social History   Tobacco Use Smoking Status Former  Types: Cigarettes  Quit date: 69  Years since quitting: 47.1 Smokeless Tobacco Never     Social History Social History    Socioeconomic History  Marital status: Married Tobacco Use  Smoking status: Former     Types: Cigarettes     Quit date: 1976     Years since quitting: 64.1  Smokeless tobacco: Never Vaping Use  Vaping Use: Never used Substance and Sexual Activity  Alcohol use: Not Currently  Drug use: Defer  Sexual activity: Defer      Objective:     Vitals:   01/19/22 1058 BP: 110/80 Pulse: 88 Temp: 36.8 C (98.2 F) SpO2: 98% Weight: 98.5 kg (217 lb 3.2 oz) Height: 182.9 cm (6')   Body mass index is 29.46 kg/m.   Physical  Exam Vitals reviewed.  Constitutional:      General: He is not in acute distress.    Appearance: Normal appearance.  HENT:     Head: Normocephalic and atraumatic.  Eyes:     General: No scleral icterus.    Conjunctiva/sclera: Conjunctivae normal.  Cardiovascular:     Rate and Rhythm: Normal rate and regular rhythm.     Heart sounds: No murmur heard. Pulmonary:     Effort: Pulmonary effort is normal. No respiratory distress.     Breath sounds: Normal breath sounds.  Abdominal:     General: Abdomen is flat. There is no distension.     Tenderness: There is no abdominal tenderness.  Musculoskeletal:        General: No swelling or deformity. Normal range of motion.     Cervical back: Normal range of motion.  Skin:    General: Skin is warm and dry.     Coloration: Skin is not jaundiced.  Neurological:     General: No focal deficit present.     Mental Status: He is alert and oriented to person, place, and time.  Psychiatric:        Mood and Affect: Mood normal.        Behavior: Behavior normal.        Thought Content: Thought content normal.          Labs, Imaging and Diagnostic Testing:   PET/CT 01/18/22: IMPRESSION: 1. Partial response to therapy of gastric cardia primary and adjacent gastrohepatic ligament nodal metastasis. 2. No new or progressive disease. 3. Similar tiny left lower lobe pulmonary nodule laterally. There may be a new more medial subpleural left lower lobe 4 mm nodule. These are both below PET resolution. Recommend attention on follow-up. 4. Decrease in diffuse thyroid hypermetabolism which again can be seen with thyroiditis.     Assessment and Plan:    Diagnoses and all orders for this visit:   Malignant neoplasm of fundus of stomach (CMS-HCC)       This is a 66 yo male with adenocarcinoma of the gastric cardia, s/p chemoradiation. Restaging PET shows partial treatment response with no progressive or metastatic disease. This is amenable to  surgical resection via total gastrectomy with esophagojejunostomy. Patient will require a feeding J tube for postop nutrition. As the tumor may involve the GE junction, he may require a distal esophagectomy as well, and this will be a joint case with Dr. Kipp Brood. I have discussed the procedure details with the patient and his wife and reviewed the risks, including bleeding, infection, anastomotic leak, and perioperative MI.  He is at increased risk for cardiac complications, per his cardiologist no further workup is needed to further optimize him for surgery.  He will be in the hospital for about 1 week and can anticipate tube feed dependence for several weeks to months postoperatively. He should continue aspirin throughout the perioperative period and does not need to hold this, which I reviewed with him today. I anticipate scheduling surgery within about 8 weeks after completion of his radiation, in either late February or early March, will coordinate this with Dr. Kipp Brood.  Michaelle Birks, [facility], Hepatobiliary and Pancreatic Surgery 01/19/22 11:42 AM

## 2022-01-20 ENCOUNTER — Inpatient Hospital Stay: Payer: Medicare PPO | Admitting: Oncology

## 2022-01-20 ENCOUNTER — Other Ambulatory Visit: Payer: Self-pay | Admitting: *Deleted

## 2022-01-20 ENCOUNTER — Encounter: Payer: Self-pay | Admitting: Oncology

## 2022-01-20 ENCOUNTER — Inpatient Hospital Stay: Payer: Medicare PPO | Attending: Oncology

## 2022-01-20 ENCOUNTER — Other Ambulatory Visit: Payer: Self-pay

## 2022-01-20 VITALS — BP 134/83 | HR 77 | Temp 97.8°F | Resp 18 | Ht 72.0 in | Wt 219.6 lb

## 2022-01-20 DIAGNOSIS — Z9221 Personal history of antineoplastic chemotherapy: Secondary | ICD-10-CM | POA: Diagnosis not present

## 2022-01-20 DIAGNOSIS — E039 Hypothyroidism, unspecified: Secondary | ICD-10-CM | POA: Diagnosis not present

## 2022-01-20 DIAGNOSIS — I700 Atherosclerosis of aorta: Secondary | ICD-10-CM | POA: Diagnosis not present

## 2022-01-20 DIAGNOSIS — D509 Iron deficiency anemia, unspecified: Secondary | ICD-10-CM | POA: Diagnosis not present

## 2022-01-20 DIAGNOSIS — C160 Malignant neoplasm of cardia: Secondary | ICD-10-CM

## 2022-01-20 DIAGNOSIS — E119 Type 2 diabetes mellitus without complications: Secondary | ICD-10-CM | POA: Diagnosis not present

## 2022-01-20 DIAGNOSIS — R918 Other nonspecific abnormal finding of lung field: Secondary | ICD-10-CM | POA: Insufficient documentation

## 2022-01-20 DIAGNOSIS — I2510 Atherosclerotic heart disease of native coronary artery without angina pectoris: Secondary | ICD-10-CM | POA: Diagnosis not present

## 2022-01-20 DIAGNOSIS — R1310 Dysphagia, unspecified: Secondary | ICD-10-CM | POA: Insufficient documentation

## 2022-01-20 DIAGNOSIS — N400 Enlarged prostate without lower urinary tract symptoms: Secondary | ICD-10-CM | POA: Insufficient documentation

## 2022-01-20 DIAGNOSIS — G4730 Sleep apnea, unspecified: Secondary | ICD-10-CM | POA: Insufficient documentation

## 2022-01-20 DIAGNOSIS — I10 Essential (primary) hypertension: Secondary | ICD-10-CM | POA: Insufficient documentation

## 2022-01-20 LAB — CBC WITH DIFFERENTIAL (CANCER CENTER ONLY)
Abs Immature Granulocytes: 0.01 10*3/uL (ref 0.00–0.07)
Basophils Absolute: 0 10*3/uL (ref 0.0–0.1)
Basophils Relative: 1 %
Eosinophils Absolute: 0.1 10*3/uL (ref 0.0–0.5)
Eosinophils Relative: 2 %
HCT: 33.4 % — ABNORMAL LOW (ref 39.0–52.0)
Hemoglobin: 10.5 g/dL — ABNORMAL LOW (ref 13.0–17.0)
Immature Granulocytes: 0 %
Lymphocytes Relative: 34 %
Lymphs Abs: 1.3 10*3/uL (ref 0.7–4.0)
MCH: 25.4 pg — ABNORMAL LOW (ref 26.0–34.0)
MCHC: 31.4 g/dL (ref 30.0–36.0)
MCV: 80.7 fL (ref 80.0–100.0)
Monocytes Absolute: 0.7 10*3/uL (ref 0.1–1.0)
Monocytes Relative: 17 %
Neutro Abs: 1.8 10*3/uL (ref 1.7–7.7)
Neutrophils Relative %: 46 %
Platelet Count: 215 10*3/uL (ref 150–400)
RBC: 4.14 MIL/uL — ABNORMAL LOW (ref 4.22–5.81)
RDW: 19.2 % — ABNORMAL HIGH (ref 11.5–15.5)
WBC Count: 3.9 10*3/uL — ABNORMAL LOW (ref 4.0–10.5)
nRBC: 0 % (ref 0.0–0.2)

## 2022-01-20 NOTE — Progress Notes (Signed)
[facility] OFFICE PROGRESS NOTE   Diagnosis: Gastroesophageal cancer  INTERVAL HISTORY:   Zachary Bowers returns as scheduled.  He reports improvement in dysphagia since completing the course of neoadjuvant therapy.  No new complaint.  He is not taking iron.  He has been evaluated by Drs. Zachary Bowers and Zachary Bowers and is scheduled for surgery 02/11/2022.  Objective:  Vital signs in last 24 hours:  Blood pressure 134/83, pulse 77, temperature 97.8 F (36.6 C), temperature source Oral, resp. rate 18, height 6' (1.829 m), weight 219 lb 9.6 oz (99.6 kg), SpO2 100 %.    Lymphatics: No cervical or supraclavicular nodes Resp: Lungs clear bilaterally Cardio: Regular rate and rhythm GI: Nontender, no mass, no hepatosplenomegaly Vascular: No leg edema   Lab Results:  Lab Results  Component Value Date   WBC 3.9 (L) 01/20/2022   HGB 10.5 (L) 01/20/2022   HCT 33.4 (L) 01/20/2022   MCV 80.7 01/20/2022   PLT 215 01/20/2022   NEUTROABS 1.8 01/20/2022    CMP  Lab Results  Component Value Date   NA 138 12/17/2021   K 3.6 12/17/2021   CL 102 12/17/2021   CO2 26 12/17/2021   GLUCOSE 193 (H) 12/17/2021   BUN 11 12/17/2021   CREATININE 0.87 12/17/2021   CALCIUM 9.5 12/17/2021   PROT 7.3 12/17/2021   ALBUMIN 4.2 12/17/2021   AST 12 (L) 12/17/2021   ALT 15 12/17/2021   ALKPHOS 88 12/17/2021   BILITOT 0.5 12/17/2021   GFRNONAA >60 12/17/2021    Lab Results  Component Value Date   CEA 1.50 10/30/2021    No results found for: INR, LABPROT  Imaging:  NM PET Image Restag (PS) Skull Base To Thigh  Result Date: 01/19/2022 CLINICAL DATA:  Subsequent treatment strategy for restaging of gastric cancer. Radiation therapy finishing 12/22/2021. Status post chemotherapy finishing 12/18/2021. EXAM: NUCLEAR MEDICINE PET SKULL BASE TO THIGH TECHNIQUE: 10.7 mCi F-18 FDG was injected intravenously. Full-ring PET imaging was performed from the skull base to thigh after the radiotracer.  CT data was obtained and used for attenuation correction and anatomic localization. Fasting blood glucose: 162 mg/dl COMPARISON:  10/20/2021 FINDINGS: Mediastinal blood pool activity: SUV max 2.4 Liver activity: SUV max NA NECK: No cervical nodal hypermetabolism. Again identified is diffuse thyroid hypermetabolism at a S.U.V. max of 5.2 today versus a S.U.V. max of 7.4 on the prior exam. Incidental CT findings: No cervical adenopathy. CHEST: No pulmonary parenchymal or thoracic nodal hypermetabolism. Incidental CT findings: Aortic and coronary artery calcification. The 3 mm left lower lobe pulmonary nodule detailed on the prior exam is similar on 46/8. There may be a more medial subpleural left lower lobe 4 mm nodule on 46/8 which is not readily apparent on the prior. ABDOMEN/PELVIS: Gastric cardia hypermetabolism measures a S.U.V. max of 4.7 today, including on 109/4. Compare a S.U.V. max of 11.8 on the prior exam. This area is underdistended on CT, with equivocal residual wall thickening at up to 2.1 cm. Gastrohepatic ligament node measures 1.4 cm and a S.U.V. max of 3.5 on 117/4. Compare 2.1 cm and a S.U.V. max of 6.6 on the prior exam (when remeasured). No other abdominopelvic nodal hypermetabolism. Again identified is anal hypermetabolism at a S.U.V. max of 7.0. Per clinic note, this has been evaluated and there is no correlate lesion in this area. Incidental CT findings: Abdominal aortic atherosclerosis. Normal noncontrast appearance of the liver, gallbladder, pancreas, adrenal glands, kidneys. Mild prostatomegaly. Tiny fat containing left inguinal hernia.  SKELETON: No abnormal marrow activity. Incidental CT findings: none IMPRESSION: 1. Partial response to therapy of gastric cardia primary and adjacent gastrohepatic ligament nodal metastasis. 2. No new or progressive disease. 3. Similar tiny left lower lobe pulmonary nodule laterally. There may be a new more medial subpleural left lower lobe 4 mm nodule.  These are both below PET resolution. Recommend attention on follow-up. 4. Decrease in diffuse thyroid hypermetabolism which again can be seen with thyroiditis. 5. Incidental findings, including: Coronary artery atherosclerosis. Aortic Atherosclerosis (ICD10-I70.0). Electronically Signed   By: Abigail Miyamoto M.D.   On: 01/19/2022 09:58    Medications: I have reviewed the patient's current medications.   Assessment/Plan:  Gastric cancer CT abdomen/pelvis 10/08/2021-probable mass in the proximal stomach, enlarged gastropathic ligament node Upper endoscopy 10/01/2021-gastric cardia/fundus mass with oozing and stigmata of recent bleeding-biopsy at least intramucosal adenocarcinoma, mismatch repair protein expression intact, PD-L1 combined positive score 7%, HER2 positive PET 10/20/2021-hypermetabolic proximal gastric primary, or hypermetabolic gastropathic ligament node, right paramidline prostatic hypermetabolism, anal hypermetabolism without a CT mass Radiation 11/11/2021-12/22/2021 Cycle 1  Taxol/carboplatin 11/12/2021 Cycle 2 Taxol/carboplatin 11/19/2021 Cycle 3 Taxol/carboplatin 11/26/2021 Cycle 4 Taxol/carboplatin 12/03/2021 Cycle 5 Taxol/carboplatin 12/18/2021 PET 01/19/2022-decreased hypermetabolism at the gastric cardia, decreased size and metabolic activity associated with an enlarged gastrohepatic node, persistent anal hypermetabolism, stable lateral left lower lobe nodule, possible new more medial subpleural left lower lobe nodule-both nodules below PET resolution  Microcytic anemia secondary to #1 Diabetes Hypertension Coronary artery disease, cardiac catheterization 08/11/2021-placement of proximal and distal right coronary artery stents Hypothyroidism Sleep apnea Anal hypermetabolism on the PET 10/20/2021-no mass identified     Disposition: [name] appears stable.  Completed the course of neoadjuvant therapy.  The restaging PET is consistent with a response to the neoadjuvant  therapy.  No evidence of disease progression.  The tiny lung nodules are nonspecific, most likely benign.  He has been evaluated by Drs. Zachary Bowers and [employer].  He is scheduled for surgery 02/11/2022.  I will check on him in the hospital.  He was scheduled for follow-up at the cancer center for approximately 3 weeks following surgery.  Betsy Coder, MD  01/20/2022  8:57 AM

## 2022-01-21 ENCOUNTER — Other Ambulatory Visit: Payer: Self-pay | Admitting: *Deleted

## 2022-01-21 DIAGNOSIS — C160 Malignant neoplasm of cardia: Secondary | ICD-10-CM

## 2022-01-29 ENCOUNTER — Ambulatory Visit (INDEPENDENT_AMBULATORY_CARE_PROVIDER_SITE_OTHER): Payer: Medicare PPO | Admitting: Thoracic Surgery (Cardiothoracic Vascular Surgery)

## 2022-01-29 ENCOUNTER — Other Ambulatory Visit: Payer: Self-pay

## 2022-01-29 VITALS — BP 136/81 | HR 78 | Resp 20 | Ht 72.0 in | Wt 219.0 lb

## 2022-01-29 DIAGNOSIS — C160 Malignant neoplasm of cardia: Secondary | ICD-10-CM

## 2022-01-29 NOTE — Progress Notes (Signed)
° °   °  [address]       [place] [address]             [phone]        Zachary Bowers [place] Medical Record #[id number] Date of Birth: May 30, 1956  Referring: [name], MD Primary Care: Shirline Frees, MD Primary Cardiologist:[place] Doreatha Lew, MD  Reason for visit:   follow-up  [name] comes in today to discuss surgical plans for treatment of his gastric cancer.  He has completed his neoadjuvant therapy.  He is in good spirits and is optimistic about the surgery.  Discussed the details of a right thoracoscopy with esophagojejunostomy.  This will be done if an esophagojejunostomy cannot be performed from the abdomen.  He is scheduled for February 11, 2022.  Lajuana Matte 01/29/2022 1:11 PM

## 2022-02-09 NOTE — Progress Notes (Signed)
Surgical Instructions    Your procedure is scheduled on Thursday, March 2nd, 2023.   Report to [facility] Main Entrance "A" at 05:30 A.M., then check in with the Admitting office.  Call this number if you have problems the morning of surgery:  [phone]   If you have any questions prior to your surgery date call [phone]: Open Monday-Friday 8am-4pm    Remember:  Do not eat after midnight the night before your surgery  You may drink clear liquids until 04:30 the morning of your surgery.   Clear liquids allowed are: Water, Non-Citrus Juices (without pulp), Carbonated Beverages, Clear Tea, Black Coffee ONLY (NO MILK, CREAM OR POWDERED CREAMER of any kind), and Gatorade  Patient Instructions  The night before surgery (if you have diabetes) : drink TWO (2) 12 oz G2 given to you in your pre admission testing appointment  The day of surgery (if you have diabetes): Drink ONE (1) 12 oz G2 given to you in your pre admission testing appointment by 04:30 the morning of surgery. Drink in one sitting. Do not sip.  This drink was given to you during your hospital  pre-op appointment visit.  Nothing else to drink after completing the  12 oz bottle of G2.         If you have questions, please contact your surgeons office.     Take these medicines the morning of surgery with A SIP OF WATER:   amLODipine (NORVASC)  atorvastatin (LIPITOR)  levothyroxine (SYNTHROID, LEVOTHROID)   If needed:  acetaminophen (TYLENOL)  nitroGLYCERIN (NITROSTAT)   Follow your surgeon's instructions on when to stop Aspirin.  If no instructions were given by your surgeon then you will need to call the office to get those instructions.     As of today, STOP taking any Aspirin (unless otherwise instructed by your surgeon) Aleve, Naproxen, Ibuprofen, Motrin, Advil, Goody's, BC's, all herbal medications, fish oil, and all vitamins.   WHAT DO I DO ABOUT MY DIABETES MEDICATION?   Do not take glimepiride  (AMARYL) and metFORMIN (GLUCOPHAGE) the morning of surgery.   HOW TO MANAGE YOUR DIABETES BEFORE AND AFTER SURGERY  Why is it important to control my blood sugar before and after surgery? Improving blood sugar levels before and after surgery helps healing and can limit problems. A way of improving blood sugar control is eating a healthy diet by:  Eating less sugar and carbohydrates  Increasing activity/exercise  Talking with your doctor about reaching your blood sugar goals High blood sugars (greater than 180 mg/dL) can raise your risk of infections and slow your recovery, so you will need to focus on controlling your diabetes during the weeks before surgery. Make sure that the doctor who takes care of your diabetes knows about your planned surgery including the date and location.  How do I manage my blood sugar before surgery? Check your blood sugar at least 4 times a day, starting 2 days before surgery, to make sure that the level is not too high or low.  Check your blood sugar the morning of your surgery when you wake up and every 2 hours until you get to the Short Stay unit.  If your blood sugar is less than 70 mg/dL, you will need to treat for low blood sugar: Do not take insulin. Treat a low blood sugar (less than 70 mg/dL) with  cup of clear juice (cranberry or apple), 4 glucose tablets, OR glucose gel. Recheck blood sugar in 15 minutes after treatment (  to make sure it is greater than 70 mg/dL). If your blood sugar is not greater than 70 mg/dL on recheck, call [phone] for further instructions. Report your blood sugar to the short stay nurse when you get to Short Stay.  If you are admitted to the hospital after surgery: Your blood sugar will be checked by the staff and you will probably be given insulin after surgery (instead of oral diabetes medicines) to make sure you have good blood sugar levels. The goal for blood sugar control after surgery is 80-180 mg/dL.              The day of surgery:  Do not wear jewelry  Do not wear lotions, powders, colognes, or deodorant. Men may shave face and neck. Do not bring valuables to the hospital.   [facility] is not responsible for any belongings or valuables. .   Do NOT Smoke (Tobacco/Vaping)  24 hours prior to your procedure  If you use a CPAP at night, you may bring your mask for your overnight stay.   Contacts, glasses, hearing aids, dentures or partials may not be worn into surgery, please bring cases for these belongings   For patients admitted to the hospital, discharge time will be determined by your treatment team.   Patients discharged the day of surgery will not be allowed to drive home, and someone needs to stay with them for 24 hours.  NO VISITORS WILL BE ALLOWED IN PRE-OP WHERE PATIENTS ARE PREPPED FOR SURGERY.  ONLY 1 SUPPORT PERSON MAY BE PRESENT IN THE WAITING ROOM WHILE YOU ARE IN SURGERY.  IF YOU ARE TO BE ADMITTED, ONCE YOU ARE IN YOUR ROOM YOU WILL BE ALLOWED TWO (2) VISITORS. 1 (ONE) VISITOR MAY STAY OVERNIGHT BUT MUST ARRIVE TO THE ROOM BY 8pm.  Minor children may have two parents present. Special consideration for safety and communication needs will be reviewed on a case by case basis.  Special instructions:    Oral Hygiene is also important to reduce your risk of infection.  Remember - BRUSH YOUR TEETH THE MORNING OF SURGERY WITH YOUR REGULAR TOOTHPASTE   [facility]- Preparing For Surgery  Before surgery, you can play an important role. Because skin is not sterile, your skin needs to be as free of germs as possible. You can reduce the number of germs on your skin by washing with CHG (chlorahexidine gluconate) Soap before surgery.  CHG is an antiseptic cleaner which kills germs and bonds with the skin to continue killing germs even after washing.     Please do not use if you have an allergy to CHG or antibacterial soaps. If your skin becomes reddened/irritated stop using the CHG.  Do  not shave (including legs and underarms) for at least 48 hours prior to first CHG shower. It is OK to shave your face.  Please follow these instructions carefully.     Shower the NIGHT BEFORE SURGERY and the MORNING OF SURGERY with CHG Soap.   If you chose to wash your hair, wash your hair first as usual with your normal shampoo. After you shampoo, rinse your hair and body thoroughly to remove the shampoo.  Then [employer] and genitals (private parts) with your normal soap and rinse thoroughly to remove soap.  After that Use CHG Soap as you would any other liquid soap. You can apply CHG directly to the skin and wash gently with a scrungie or a clean washcloth.   Apply the CHG Soap to  your body ONLY FROM THE NECK DOWN.  Do not use on open wounds or open sores. Avoid contact with your eyes, ears, mouth and genitals (private parts). Wash Face and genitals (private parts)  with your normal soap.   Wash thoroughly, paying special attention to the area where your surgery will be performed.  Thoroughly rinse your body with warm water from the neck down.  DO NOT shower/wash with your normal soap after using and rinsing off the CHG Soap.  Pat yourself dry with a CLEAN TOWEL.  Wear CLEAN PAJAMAS to bed the night before surgery  Place CLEAN SHEETS on your bed the night before your surgery  DO NOT SLEEP WITH PETS.   Day of Surgery:  Take a shower with CHG soap. Wear Clean/Comfortable clothing the morning of surgery Do not apply any deodorants/lotions.   Remember to brush your teeth WITH YOUR REGULAR TOOTHPASTE.    COVID testing  If you are going to stay overnight or be admitted after your procedure/surgery and require a pre-op COVID test, please follow these instructions after your COVID test   You are not required to quarantine however you are required to wear a well-fitting mask when you are out and around people not in your household.  If your mask becomes wet or soiled, replace with a  new one.  Wash your hands often with soap and water for 20 seconds or clean your hands with an alcohol-based hand sanitizer that contains at least 60% alcohol.  Do not share personal items.  Notify your provider: if you are in close contact with someone who has COVID  or if you develop a fever of 100.4 or greater, sneezing, cough, sore throat, shortness of breath or body aches.    Please read over the following fact sheets that you were given.

## 2022-02-10 ENCOUNTER — Ambulatory Visit (HOSPITAL_COMMUNITY)
Admission: RE | Admit: 2022-02-10 | Discharge: 2022-02-10 | Disposition: A | Discharge status: Home / Self Care | Payer: Medicare PPO | Source: Ambulatory Visit | Attending: Thoracic Surgery (Cardiothoracic Vascular Surgery) | Admitting: Thoracic Surgery (Cardiothoracic Vascular Surgery)

## 2022-02-10 ENCOUNTER — Encounter (HOSPITAL_COMMUNITY): Payer: Self-pay

## 2022-02-10 ENCOUNTER — Other Ambulatory Visit: Payer: Self-pay

## 2022-02-10 ENCOUNTER — Encounter (HOSPITAL_COMMUNITY)
Admission: RE | Admit: 2022-02-10 | Discharge: 2022-02-10 | Disposition: A | Discharge status: Home / Self Care | Payer: Medicare PPO | Source: Ambulatory Visit | Attending: Surgery | Admitting: Surgery

## 2022-02-10 VITALS — BP 133/87 | HR 72 | Temp 98.0°F | Resp 18 | Ht 72.0 in | Wt 222.4 lb

## 2022-02-10 DIAGNOSIS — Z6829 Body mass index (BMI) 29.0-29.9, adult: Secondary | ICD-10-CM | POA: Diagnosis not present

## 2022-02-10 DIAGNOSIS — Z01818 Encounter for other preprocedural examination: Secondary | ICD-10-CM | POA: Diagnosis not present

## 2022-02-10 DIAGNOSIS — Z955 Presence of coronary angioplasty implant and graft: Secondary | ICD-10-CM | POA: Diagnosis not present

## 2022-02-10 DIAGNOSIS — I10 Essential (primary) hypertension: Secondary | ICD-10-CM | POA: Diagnosis present

## 2022-02-10 DIAGNOSIS — Z9221 Personal history of antineoplastic chemotherapy: Secondary | ICD-10-CM | POA: Diagnosis not present

## 2022-02-10 DIAGNOSIS — Z87891 Personal history of nicotine dependence: Secondary | ICD-10-CM | POA: Diagnosis not present

## 2022-02-10 DIAGNOSIS — Z20822 Contact with and (suspected) exposure to covid-19: Secondary | ICD-10-CM | POA: Diagnosis present

## 2022-02-10 DIAGNOSIS — C160 Malignant neoplasm of cardia: Secondary | ICD-10-CM | POA: Diagnosis present

## 2022-02-10 DIAGNOSIS — E1165 Type 2 diabetes mellitus with hyperglycemia: Secondary | ICD-10-CM | POA: Diagnosis present

## 2022-02-10 DIAGNOSIS — G4733 Obstructive sleep apnea (adult) (pediatric): Secondary | ICD-10-CM | POA: Diagnosis present

## 2022-02-10 DIAGNOSIS — C161 Malignant neoplasm of fundus of stomach: Secondary | ICD-10-CM | POA: Diagnosis present

## 2022-02-10 DIAGNOSIS — Z7982 Long term (current) use of aspirin: Secondary | ICD-10-CM | POA: Diagnosis not present

## 2022-02-10 DIAGNOSIS — R066 Hiccough: Secondary | ICD-10-CM | POA: Diagnosis not present

## 2022-02-10 DIAGNOSIS — C169 Malignant neoplasm of stomach, unspecified: Secondary | ICD-10-CM | POA: Diagnosis not present

## 2022-02-10 DIAGNOSIS — Z8249 Family history of ischemic heart disease and other diseases of the circulatory system: Secondary | ICD-10-CM | POA: Diagnosis not present

## 2022-02-10 DIAGNOSIS — Z923 Personal history of irradiation: Secondary | ICD-10-CM | POA: Diagnosis not present

## 2022-02-10 DIAGNOSIS — I2510 Atherosclerotic heart disease of native coronary artery without angina pectoris: Secondary | ICD-10-CM | POA: Diagnosis present

## 2022-02-10 DIAGNOSIS — C772 Secondary and unspecified malignant neoplasm of intra-abdominal lymph nodes: Secondary | ICD-10-CM | POA: Diagnosis present

## 2022-02-10 DIAGNOSIS — Z833 Family history of diabetes mellitus: Secondary | ICD-10-CM | POA: Diagnosis not present

## 2022-02-10 DIAGNOSIS — D630 Anemia in neoplastic disease: Secondary | ICD-10-CM | POA: Diagnosis present

## 2022-02-10 DIAGNOSIS — E785 Hyperlipidemia, unspecified: Secondary | ICD-10-CM | POA: Diagnosis present

## 2022-02-10 DIAGNOSIS — Z0389 Encounter for observation for other suspected diseases and conditions ruled out: Secondary | ICD-10-CM | POA: Diagnosis not present

## 2022-02-10 DIAGNOSIS — E119 Type 2 diabetes mellitus without complications: Secondary | ICD-10-CM | POA: Diagnosis not present

## 2022-02-10 DIAGNOSIS — E440 Moderate protein-calorie malnutrition: Secondary | ICD-10-CM | POA: Diagnosis present

## 2022-02-10 LAB — BLOOD GAS, ARTERIAL
Acid-Base Excess: 0.8 mmol/L (ref 0.0–2.0)
Bicarbonate: 25.3 mmol/L (ref 20.0–28.0)
O2 Saturation: 98.9 %
Patient temperature: 37
pCO2 arterial: 39 mmHg (ref 32–48)
pH, Arterial: 7.42 (ref 7.35–7.45)
pO2, Arterial: 103 mmHg (ref 83–108)

## 2022-02-10 LAB — CBC
HCT: 35.7 % — ABNORMAL LOW (ref 39.0–52.0)
Hemoglobin: 11.4 g/dL — ABNORMAL LOW (ref 13.0–17.0)
MCH: 26.8 pg (ref 26.0–34.0)
MCHC: 31.9 g/dL (ref 30.0–36.0)
MCV: 83.8 fL (ref 80.0–100.0)
Platelets: 196 10*3/uL (ref 150–400)
RBC: 4.26 MIL/uL (ref 4.22–5.81)
RDW: 17.2 % — ABNORMAL HIGH (ref 11.5–15.5)
WBC: 4.7 10*3/uL (ref 4.0–10.5)
nRBC: 0 % (ref 0.0–0.2)

## 2022-02-10 LAB — COMPREHENSIVE METABOLIC PANEL
ALT: 19 U/L (ref 0–44)
AST: 18 U/L (ref 15–41)
Albumin: 4 g/dL (ref 3.5–5.0)
Alkaline Phosphatase: 94 U/L (ref 38–126)
Anion gap: 10 (ref 5–15)
BUN: 13 mg/dL (ref 8–23)
CO2: 21 mmol/L — ABNORMAL LOW (ref 22–32)
Calcium: 9.2 mg/dL (ref 8.9–10.3)
Chloride: 107 mmol/L (ref 98–111)
Creatinine, Ser: 0.99 mg/dL (ref 0.61–1.24)
GFR, Estimated: >60 mL/min (ref >60)
Glucose, Bld: 166 mg/dL — ABNORMAL HIGH (ref 70–99)
Potassium: 3.9 mmol/L (ref 3.5–5.1)
Sodium: 138 mmol/L (ref 135–145)
Total Bilirubin: 0.6 mg/dL (ref 0.3–1.2)
Total Protein: 7 g/dL (ref 6.5–8.1)

## 2022-02-10 LAB — URINALYSIS, ROUTINE W REFLEX MICROSCOPIC
Bilirubin Urine: NEGATIVE
Glucose, UA: NEGATIVE mg/dL
Hgb urine dipstick: NEGATIVE
Ketones, ur: NEGATIVE mg/dL
Leukocytes,Ua: NEGATIVE
Nitrite: NEGATIVE
Protein, ur: NEGATIVE mg/dL
Specific Gravity, Urine: 1.025 (ref 1.005–1.030)
pH: 6 (ref 5.0–8.0)

## 2022-02-10 LAB — APTT: aPTT: 28 seconds (ref 24–36)

## 2022-02-10 LAB — GLUCOSE, CAPILLARY: Glucose-Capillary: 155 mg/dL — ABNORMAL HIGH (ref 70–99)

## 2022-02-10 LAB — SURGICAL PCR SCREEN
MRSA, PCR: NEGATIVE
Staphylococcus aureus: NEGATIVE

## 2022-02-10 LAB — PROTIME-INR
INR: 1 (ref 0.8–1.2)
Prothrombin Time: 13.1 seconds (ref 11.4–15.2)

## 2022-02-10 LAB — SARS CORONAVIRUS 2 BY RT PCR (HOSPITAL ORDER, PERFORMED IN ~~LOC~~ HOSPITAL LAB): SARS Coronavirus 2: NEGATIVE

## 2022-02-10 LAB — HEMOGLOBIN A1C
Hgb A1c MFr Bld: 6.6 % — ABNORMAL HIGH (ref 4.8–5.6)
Mean Plasma Glucose: 142.72 mg/dL

## 2022-02-10 NOTE — Anesthesia Preprocedure Evaluation (Addendum)
Anesthesia Evaluation  ?Patient identified by MRN, date of birth, ID band ?Patient awake ? ? ? ?Reviewed: ?Allergy & Precautions, NPO status , Patient's Chart, lab work & pertinent test results ? ?Airway ?Mallampati: II ? ?TM Distance: >3 FB ?Neck ROM: Full ? ? ? Dental ?no notable dental hx. ?(+) Upper Dentures, Missing, Dental Advisory Given,  ?  ?Pulmonary ?sleep apnea and Continuous Positive Airway Pressure Ventilation , former smoker,  ?  ?Pulmonary exam normal ?breath sounds clear to auscultation ? ? ? ? ? ? Cardiovascular ?hypertension, Pt. on medications ?+ CAD  ?Normal cardiovascular exam ?Rhythm:Regular Rate:Normal ? ?08/19/21 TTE  ?1. Left ventricular ejection fraction, by estimation, is 60 to 65%. The  ?left ventricle has normal function. The left ventricle has no regional  ?wall motion abnormalities. Left ventricular diastolic parameters are  ?consistent with Grade I diastolic  ?dysfunction (impaired relaxation).  ??2. Right ventricular systolic function is normal. The right ventricular  ?size is normal.  ??3. The mitral valve is normal in structure. No evidence of mitral valve  ?regurgitation. No evidence of mitral stenosis.  ??4. The aortic valve is tricuspid. Aortic valve regurgitation is mild. No  ?aortic stenosis is present.  ??5. The inferior vena cava is normal in size with greater than 50%  ?respiratory variability, suggesting right atrial pressure of 3 mmHg.  ?  ?Neuro/Psych ?  ? 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  ?Endo/Other  ?diabetes ? Renal/GU ?  ? ?  ?Musculoskeletal ?negative musculoskeletal ROS ?(+)  ? Abdominal ?(+) + obese (BMI 30.16),   ?Peds ? Hematology ? ?(+) Blood dyscrasia, anemia , Lab Results ?     Component                Value               Date                 ?     WBC                      4.7                 02/10/2022           ?     HGB                      11.4 (L)            02/10/2022            ?     HCT                      35.7 (L)            02/10/2022           ?     MCV                      83.8                02/10/2022           ?     PLT                      196                 02/10/2022           ? ?  T&S available   ?Anesthesia Other Findings ?ALL: Niacin ? Reproductive/Obstetrics ? ?  ? ? ? ? ? ? ? ? ? ? ? ? ? ?  ?  ? ? ? ? ? ? ?Anesthesia Physical ?Anesthesia Plan ? ?ASA: 3 ? ?Anesthesia Plan: General  ? ?Post-op Pain Management: Dilaudid IV  ? ?Induction: Intravenous ? ?PONV Risk Score and Plan: 3 and Treatment may vary due to age or medical condition, Midazolam, Ondansetron and Propofol infusion ? ?Airway Management Planned: Oral ETT ? ?Additional Equipment:  ? ?Intra-op Plan:  ? ?Post-operative Plan: Extubation in OR ? ?Informed Consent:  ? ? ? ?Dental advisory given ? ?Plan Discussed with:  ? ?Anesthesia Plan Comments: (2 Large bore IVs 18g or greater, T x C )  ? ? ? ? ? ?Anesthesia Quick Evaluation ? ?

## 2022-02-10 NOTE — Progress Notes (Signed)
Anesthesia Chart Review: ?Same day workup ? ?Follows with cardiology for history of CAD.  In August 2022 he was evaluated for chest pain.  Coronary CTA showed three-vessel disease.  Left heart catheterization with severe RCA disease as well as disease in a second diagonal branch as well as circumflex.  He underwent PCI to the proximal and distal RCA.  He does have residual disease 50% in the mid LAD, 75% in the second marginal, 80% in the ramus, 75% in the distal circumflex.  These are being managed medically.  DAPT was stopped after 2 months secondary to GI bleeding.  Given stomach cancer diagnosis, patient felt too high risk to resume this.  Per cardiology notes, it is felt that he has received most of the benefit of DAPT in the setting of newer generation stents.  He was advised to continue ASA 81 mg daily.  Last seen by cardiologist Dr. Audie Box 12/24/2021.  Per note, PCI of the other lesions is not being pursued due to patient being too high for bleeding risk.  Echo 08/19/2021 noted to be reassuring with normal LV function.  It was also noted that the patient continues to describe symptoms of squeezing in his chest and abdomen, however this is felt to be related to his gastric cancer, he has lost roughly 35 pounds secondary to pain after eating. ? ?History of OSA, not on CPAP. ? ?Preop labs reviewed, mild anemia with hemoglobin 11.4, otherwise unremarkable.  Non-insulin-dependent DM2 well-controlled, A1c 6.6. ? ?EKG 02/10/2022: NSR.  Rate 64.  Left axis deviation.  Nonspecific T wave abnormality. ? ?[place] 08/11/2021 ?  Ramus lesion is 80% stenosed. ?  Dist Cx lesion is 75% stenosed. ?  Mid LAD lesion is 50% stenosed. ?  2nd Diag lesion is 75% stenosed. ?  Prox Cx lesion is 25% stenosed. ?  Ost RCA lesion is 25% stenosed. ?  Prox RCA lesion is 95% stenosed.  A drug-eluting stent was successfully placed using a STENT ONYX FRONTIER 3.5X18, postdilated to 3.75 mm and optimized with intravascular ultrasound. ?  Post  intervention, there is a 0% residual stenosis. ?  Dist RCA lesion is 80% stenosed.  A drug-eluting stent was successfully placed using a STENT ONYX FRONTIER 3.5X15, postdilated to 3.75 mm and optimized by intravascular ultrasound. ?  A drug-eluting stent was successfully placed using a STENT ONYX FRONTIER 3.5X18. ?  A drug-eluting stent was successfully placed using a STENT ONYX FRONTIER 3.5X15. ?  Post intervention, there is a 0% residual stenosis. ?  The left ventricular systolic function is normal. ?  LV end diastolic pressure is normal. ?  The left ventricular ejection fraction is 55-65% by visual estimate. ?  There is no aortic valve stenosis. ?  ?TTE 08/19/2021 ? 1. Left ventricular ejection fraction, by estimation, is 60 to 65%. The  ?left ventricle has normal function. The left ventricle has no regional  ?wall motion abnormalities. Left ventricular diastolic parameters are  ?consistent with Grade I diastolic  ?dysfunction (impaired relaxation).  ? 2. Right ventricular systolic function is normal. The right ventricular  ?size is normal.  ? 3. The mitral valve is normal in structure. No evidence of mitral valve  ?regurgitation. No evidence of mitral stenosis.  ? 4. The aortic valve is tricuspid. Aortic valve regurgitation is mild. No  ?aortic stenosis is present.  ? 5. The inferior vena cava is normal in size with greater than 50%  ?respiratory variability, suggesting right atrial pressure of 3 mmHg.  ?  ? ? ?  Karoline Caldwell, PA-C ?[facility] Short Stay Center/Anesthesiology ?Phone [phone] ?02/11/2022 3:24 PM ? ?

## 2022-02-10 NOTE — Progress Notes (Signed)
PCP: Dr. Shirline Frees ?Cardiologist: Dr. Audie Box ?Oncologist: Dr. Benay Spice ? ?EKG: Today ?CXR: Today ?ECHO: 08-19-2021 ?Stress Test: 02-01-2017 [employer] ?Cardiac Cath: 08-11-2021 ?Sleep Study: reports: "years ago, was supposed to have another one when doing chemo, but never had it done".  Does not currently use CPAP ? ?Fasting Blood Sugar- 158-180's ?Checks Blood Sugar 2-3 times per week ? ?ASA: reports no instructions given. LD 02/09/22 ? ?ERAS: 2 G2 drinks night before surgery, 1 G2 drink morning of surgery ? ?Covid tested today ? ?Patient denies shortness of breath, fever, cough, and chest pain at PAT appointment. ? ?Patient verbalized understanding of instructions provided today at the PAT appointment.  Patient asked to review instructions at home and day of surgery.  ? ?Messaged anesthesia for review since surgery is tomorrow, heart hx. ?

## 2022-02-11 ENCOUNTER — Encounter (HOSPITAL_COMMUNITY)
Admission: RE | Disposition: A | Discharge status: Home / Self Care | Payer: Self-pay | Source: Home / Self Care | Attending: Surgery

## 2022-02-11 ENCOUNTER — Other Ambulatory Visit: Payer: Self-pay

## 2022-02-11 ENCOUNTER — Encounter (HOSPITAL_COMMUNITY): Payer: Self-pay | Admitting: Surgery

## 2022-02-11 ENCOUNTER — Inpatient Hospital Stay (HOSPITAL_COMMUNITY): Payer: Medicare PPO | Admitting: Critical Care Medicine

## 2022-02-11 ENCOUNTER — Inpatient Hospital Stay (HOSPITAL_COMMUNITY)
Admission: RE | Admit: 2022-02-11 | Discharge: 2022-02-20 | DRG: 327 | Disposition: A | Discharge status: Home Health | Payer: Medicare PPO | Attending: Surgery | Admitting: Surgery

## 2022-02-11 DIAGNOSIS — R066 Hiccough: Secondary | ICD-10-CM | POA: Diagnosis not present

## 2022-02-11 DIAGNOSIS — Z87891 Personal history of nicotine dependence: Secondary | ICD-10-CM | POA: Diagnosis not present

## 2022-02-11 DIAGNOSIS — I2510 Atherosclerotic heart disease of native coronary artery without angina pectoris: Secondary | ICD-10-CM | POA: Diagnosis present

## 2022-02-11 DIAGNOSIS — Z903 Acquired absence of stomach [part of]: Secondary | ICD-10-CM

## 2022-02-11 DIAGNOSIS — I10 Essential (primary) hypertension: Secondary | ICD-10-CM | POA: Diagnosis present

## 2022-02-11 DIAGNOSIS — C169 Malignant neoplasm of stomach, unspecified: Secondary | ICD-10-CM | POA: Diagnosis not present

## 2022-02-11 DIAGNOSIS — D630 Anemia in neoplastic disease: Secondary | ICD-10-CM

## 2022-02-11 DIAGNOSIS — Z955 Presence of coronary angioplasty implant and graft: Secondary | ICD-10-CM | POA: Diagnosis not present

## 2022-02-11 DIAGNOSIS — Z6829 Body mass index (BMI) 29.0-29.9, adult: Secondary | ICD-10-CM

## 2022-02-11 DIAGNOSIS — C160 Malignant neoplasm of cardia: Principal | ICD-10-CM | POA: Diagnosis present

## 2022-02-11 DIAGNOSIS — Z833 Family history of diabetes mellitus: Secondary | ICD-10-CM

## 2022-02-11 DIAGNOSIS — Z923 Personal history of irradiation: Secondary | ICD-10-CM

## 2022-02-11 DIAGNOSIS — C161 Malignant neoplasm of fundus of stomach: Secondary | ICD-10-CM | POA: Diagnosis present

## 2022-02-11 DIAGNOSIS — Z9221 Personal history of antineoplastic chemotherapy: Secondary | ICD-10-CM

## 2022-02-11 DIAGNOSIS — G4733 Obstructive sleep apnea (adult) (pediatric): Secondary | ICD-10-CM | POA: Diagnosis present

## 2022-02-11 DIAGNOSIS — Z20822 Contact with and (suspected) exposure to covid-19: Secondary | ICD-10-CM | POA: Diagnosis present

## 2022-02-11 DIAGNOSIS — E440 Moderate protein-calorie malnutrition: Secondary | ICD-10-CM | POA: Diagnosis present

## 2022-02-11 DIAGNOSIS — Z8249 Family history of ischemic heart disease and other diseases of the circulatory system: Secondary | ICD-10-CM | POA: Diagnosis not present

## 2022-02-11 DIAGNOSIS — E785 Hyperlipidemia, unspecified: Secondary | ICD-10-CM | POA: Diagnosis present

## 2022-02-11 DIAGNOSIS — E1165 Type 2 diabetes mellitus with hyperglycemia: Secondary | ICD-10-CM | POA: Diagnosis present

## 2022-02-11 DIAGNOSIS — Z7982 Long term (current) use of aspirin: Secondary | ICD-10-CM

## 2022-02-11 DIAGNOSIS — C772 Secondary and unspecified malignant neoplasm of intra-abdominal lymph nodes: Secondary | ICD-10-CM | POA: Diagnosis present

## 2022-02-11 HISTORY — PX: JEJUNOSTOMY: SHX313

## 2022-02-11 HISTORY — PX: LAPAROSCOPY: SHX197

## 2022-02-11 HISTORY — PX: GASTRECTOMY: SHX58

## 2022-02-11 LAB — POCT I-STAT, CHEM 8
BUN: 12 mg/dL (ref 8–23)
Calcium, Ion: 1.23 mmol/L (ref 1.15–1.40)
Chloride: 104 mmol/L (ref 98–111)
Creatinine, Ser: 0.9 mg/dL (ref 0.61–1.24)
Glucose, Bld: 135 mg/dL — ABNORMAL HIGH (ref 70–99)
HCT: 29 % — ABNORMAL LOW (ref 39.0–52.0)
Hemoglobin: 9.9 g/dL — ABNORMAL LOW (ref 13.0–17.0)
Potassium: 4 mmol/L (ref 3.5–5.1)
Sodium: 139 mmol/L (ref 135–145)
TCO2: 26 mmol/L (ref 22–32)

## 2022-02-11 LAB — GLUCOSE, CAPILLARY
Glucose-Capillary: 142 mg/dL — ABNORMAL HIGH (ref 70–99)
Glucose-Capillary: 180 mg/dL — ABNORMAL HIGH (ref 70–99)
Glucose-Capillary: 248 mg/dL — ABNORMAL HIGH (ref 70–99)
Glucose-Capillary: 266 mg/dL — ABNORMAL HIGH (ref 70–99)
Glucose-Capillary: 244 mg/dL — ABNORMAL HIGH (ref 70–99)

## 2022-02-11 LAB — PREPARE RBC (CROSSMATCH)

## 2022-02-11 SURGERY — GASTRECTOMY, TOTAL
Anesthesia: General | Site: Abdomen

## 2022-02-11 MED ORDER — FENTANYL CITRATE (PF) 250 MCG/5ML IJ SOLN
INTRAMUSCULAR | Status: DC | PRN
Start: 2022-02-11 — End: 2022-02-11
  Administered 2022-02-11: 100 ug via INTRAVENOUS
  Administered 2022-02-11 (×5): 50 ug via INTRAVENOUS
  Administered 2022-02-11: 100 ug via INTRAVENOUS
  Administered 2022-02-11: 50 ug via INTRAVENOUS

## 2022-02-11 MED ORDER — KETOROLAC TROMETHAMINE 30 MG/ML IJ SOLN: 30.0000 mg | Freq: Once | INTRAMUSCULAR | Status: DC | PRN

## 2022-02-11 MED ORDER — ROCURONIUM BROMIDE 10 MG/ML (PF) SYRINGE
PREFILLED_SYRINGE | INTRAVENOUS | Status: DC | PRN
  Administered 2022-02-11: 40 mg via INTRAVENOUS
  Administered 2022-02-11 (×2): 20 mg via INTRAVENOUS
  Administered 2022-02-11: 60 mg via INTRAVENOUS
  Administered 2022-02-11: 50 mg via INTRAVENOUS
  Administered 2022-02-11: 10 mg via INTRAVENOUS

## 2022-02-11 MED ORDER — LABETALOL HCL 5 MG/ML IV SOLN: 10.0000 mg | INTRAVENOUS | Status: DC | PRN

## 2022-02-11 MED ORDER — SODIUM CHLORIDE 0.9 % IV SOLN: INTRAVENOUS | Status: DC | PRN

## 2022-02-11 MED ORDER — METRONIDAZOLE 500 MG/100ML IV SOLN
500.0000 mg | INTRAVENOUS | Status: AC
  Administered 2022-02-11: 500 mg via INTRAVENOUS
  Filled 2022-02-11: qty 100

## 2022-02-11 MED ORDER — ROCURONIUM BROMIDE 10 MG/ML (PF) SYRINGE
PREFILLED_SYRINGE | INTRAVENOUS | Status: AC
  Filled 2022-02-11: qty 20

## 2022-02-11 MED ORDER — SODIUM CHLORIDE 0.9 % IV SOLN
2.0000 g | INTRAVENOUS | Status: AC
  Administered 2022-02-11: 2 g via INTRAVENOUS
  Filled 2022-02-11: qty 20

## 2022-02-11 MED ORDER — LIDOCAINE 2% (20 MG/ML) 5 ML SYRINGE
INTRAMUSCULAR | Status: AC
  Filled 2022-02-11: qty 5

## 2022-02-11 MED ORDER — ONDANSETRON HCL 4 MG/2ML IJ SOLN
INTRAMUSCULAR | Status: DC | PRN
Start: 2022-02-11 — End: 2022-02-11
  Administered 2022-02-11: 4 mg via INTRAVENOUS

## 2022-02-11 MED ORDER — FENTANYL CITRATE (PF) 250 MCG/5ML IJ SOLN
INTRAMUSCULAR | Status: AC
Start: 2022-02-11 — End: ?
  Filled 2022-02-11: qty 5

## 2022-02-11 MED ORDER — FENTANYL CITRATE (PF) 250 MCG/5ML IJ SOLN
INTRAMUSCULAR | Status: AC
  Filled 2022-02-11: qty 5

## 2022-02-11 MED ORDER — SODIUM CHLORIDE 0.9% FLUSH: 9.0000 mL | INTRAVENOUS | Status: DC | PRN

## 2022-02-11 MED ORDER — HYDROMORPHONE HCL 1 MG/ML IJ SOLN
INTRAMUSCULAR | Status: AC
  Filled 2022-02-11: qty 1

## 2022-02-11 MED ORDER — ENSURE PRE-SURGERY PO LIQD
296.0000 mL | Freq: Once | ORAL | Status: DC
Start: 2022-02-12 — End: 2022-02-11

## 2022-02-11 MED ORDER — BUPIVACAINE-EPINEPHRINE (PF) 0.25% -1:200000 IJ SOLN
INTRAMUSCULAR | Status: AC
  Filled 2022-02-11: qty 30

## 2022-02-11 MED ORDER — INSULIN ASPART 100 UNIT/ML IJ SOLN
0.0000 [IU] | INTRAMUSCULAR | Status: DC
  Administered 2022-02-11 (×2): 5 [IU] via SUBCUTANEOUS
  Administered 2022-02-11: 8 [IU] via SUBCUTANEOUS
  Administered 2022-02-12: 2 [IU] via SUBCUTANEOUS
  Administered 2022-02-12: 3 [IU] via SUBCUTANEOUS
  Administered 2022-02-12: 2 [IU] via SUBCUTANEOUS
  Administered 2022-02-12: 3 [IU] via SUBCUTANEOUS
  Administered 2022-02-12 – 2022-02-13 (×6): 2 [IU] via SUBCUTANEOUS
  Administered 2022-02-14 (×2): 3 [IU] via SUBCUTANEOUS
  Administered 2022-02-14: 2 [IU] via SUBCUTANEOUS
  Administered 2022-02-14: 3 [IU] via SUBCUTANEOUS
  Administered 2022-02-14: 2 [IU] via SUBCUTANEOUS
  Administered 2022-02-15 (×3): 3 [IU] via SUBCUTANEOUS
  Administered 2022-02-15: 5 [IU] via SUBCUTANEOUS
  Administered 2022-02-15 – 2022-02-16 (×3): 3 [IU] via SUBCUTANEOUS
  Administered 2022-02-16 (×2): 2 [IU] via SUBCUTANEOUS
  Administered 2022-02-16: 5 [IU] via SUBCUTANEOUS
  Administered 2022-02-16 – 2022-02-17 (×3): 3 [IU] via SUBCUTANEOUS
  Administered 2022-02-17: 5 [IU] via SUBCUTANEOUS
  Administered 2022-02-17 – 2022-02-18 (×3): 3 [IU] via SUBCUTANEOUS
  Administered 2022-02-18 (×2): 5 [IU] via SUBCUTANEOUS
  Administered 2022-02-18: 09:00:00 3 [IU] via SUBCUTANEOUS
  Administered 2022-02-18: 13:00:00 2 [IU] via SUBCUTANEOUS
  Administered 2022-02-19: 8 [IU] via SUBCUTANEOUS
  Administered 2022-02-19: 5 [IU] via SUBCUTANEOUS
  Administered 2022-02-19 (×2): 3 [IU] via SUBCUTANEOUS
  Administered 2022-02-19: 2 [IU] via SUBCUTANEOUS
  Administered 2022-02-20 (×3): 3 [IU] via SUBCUTANEOUS

## 2022-02-11 MED ORDER — LEVOTHYROXINE SODIUM 100 MCG/5ML IV SOLN
60.0000 ug | Freq: Every day | INTRAVENOUS | Status: DC
  Administered 2022-02-14 – 2022-02-16 (×3): 60 ug via INTRAVENOUS
  Filled 2022-02-11 (×4): qty 5

## 2022-02-11 MED ORDER — ASPIRIN 300 MG RE SUPP
150.0000 mg | Freq: Every day | RECTAL | Status: DC
  Administered 2022-02-12 – 2022-02-15 (×4): 150 mg via RECTAL
  Filled 2022-02-11 (×6): qty 1

## 2022-02-11 MED ORDER — OXYCODONE HCL 5 MG PO TABS: 5.0000 mg | ORAL_TABLET | Freq: Once | ORAL | Status: DC | PRN

## 2022-02-11 MED ORDER — METHOCARBAMOL 1000 MG/10ML IJ SOLN
500.0000 mg | Freq: Three times a day (TID) | INTRAVENOUS | Status: DC
  Administered 2022-02-11 – 2022-02-17 (×17): 500 mg via INTRAVENOUS
  Filled 2022-02-11: qty 5
  Filled 2022-02-11 (×3): qty 500
  Filled 2022-02-11: qty 5
  Filled 2022-02-11 (×2): qty 500
  Filled 2022-02-11: qty 5
  Filled 2022-02-11 (×2): qty 500
  Filled 2022-02-11 (×2): qty 5
  Filled 2022-02-11: qty 500
  Filled 2022-02-11: qty 5
  Filled 2022-02-11 (×3): qty 500
  Filled 2022-02-11: qty 5
  Filled 2022-02-11: qty 500

## 2022-02-11 MED ORDER — ORAL CARE MOUTH RINSE: 15.0000 mL | Freq: Once | OROMUCOSAL | Status: AC

## 2022-02-11 MED ORDER — ENOXAPARIN SODIUM 40 MG/0.4ML IJ SOSY
40.0000 mg | PREFILLED_SYRINGE | INTRAMUSCULAR | Status: DC
  Administered 2022-02-12 – 2022-02-20 (×9): 40 mg via SUBCUTANEOUS
  Filled 2022-02-11 (×9): qty 0.4

## 2022-02-11 MED ORDER — DIPHENHYDRAMINE HCL 50 MG/ML IJ SOLN
12.5000 mg | Freq: Four times a day (QID) | INTRAMUSCULAR | Status: DC | PRN
  Administered 2022-02-12: 12.5 mg via INTRAVENOUS
  Filled 2022-02-11: qty 1

## 2022-02-11 MED ORDER — LIDOCAINE 2% (20 MG/ML) 5 ML SYRINGE
INTRAMUSCULAR | Status: DC | PRN
Start: 2022-02-11 — End: 2022-02-11
  Administered 2022-02-11: 100 mg via INTRAVENOUS

## 2022-02-11 MED ORDER — INSULIN ASPART 100 UNIT/ML IJ SOLN
0.0000 [IU] | INTRAMUSCULAR | Status: DC | PRN
  Administered 2022-02-11: 2 [IU] via SUBCUTANEOUS
  Filled 2022-02-11: qty 1

## 2022-02-11 MED ORDER — CHLORHEXIDINE GLUCONATE 0.12 % MT SOLN
15.0000 mL | Freq: Once | OROMUCOSAL | Status: AC
  Administered 2022-02-11: 15 mL via OROMUCOSAL
  Filled 2022-02-11: qty 15

## 2022-02-11 MED ORDER — 0.9 % SODIUM CHLORIDE (POUR BTL) OPTIME
TOPICAL | Status: DC | PRN
  Administered 2022-02-11 (×4): 1000 mL

## 2022-02-11 MED ORDER — ENSURE PRE-SURGERY PO LIQD: 592.0000 mL | Freq: Once | ORAL | Status: DC

## 2022-02-11 MED ORDER — HYDROMORPHONE 1 MG/ML IV SOLN
INTRAVENOUS | Status: DC
  Administered 2022-02-11: 2.6 mg via INTRAVENOUS
  Administered 2022-02-12: 0 mg via INTRAVENOUS
  Administered 2022-02-12: 0.3 mg via INTRAVENOUS
  Administered 2022-02-12: 0 mg via INTRAVENOUS
  Administered 2022-02-12 – 2022-02-14 (×7): 0.3 mg via INTRAVENOUS
  Administered 2022-02-14: 0 mg via INTRAVENOUS
  Administered 2022-02-14 – 2022-02-15 (×3): 0.3 mg via INTRAVENOUS
  Filled 2022-02-11: qty 30

## 2022-02-11 MED ORDER — PROPOFOL 500 MG/50ML IV EMUL
INTRAVENOUS | Status: DC | PRN
Start: 2022-02-11 — End: 2022-02-11
  Administered 2022-02-11: 25 ug/kg/min via INTRAVENOUS

## 2022-02-11 MED ORDER — LACTATED RINGERS IV SOLN: INTRAVENOUS | Status: DC

## 2022-02-11 MED ORDER — ONDANSETRON HCL 4 MG/2ML IJ SOLN: 4.0000 mg | Freq: Four times a day (QID) | INTRAMUSCULAR | Status: DC | PRN

## 2022-02-11 MED ORDER — HEMOSTATIC AGENTS (NO CHARGE) OPTIME
TOPICAL | Status: DC | PRN
  Administered 2022-02-11: 1 via TOPICAL

## 2022-02-11 MED ORDER — DEXAMETHASONE SODIUM PHOSPHATE 10 MG/ML IJ SOLN
INTRAMUSCULAR | Status: DC | PRN
  Administered 2022-02-11: 4 mg via INTRAVENOUS

## 2022-02-11 MED ORDER — PROPOFOL 10 MG/ML IV BOLUS
INTRAVENOUS | Status: AC
  Filled 2022-02-11: qty 20

## 2022-02-11 MED ORDER — HYDROMORPHONE HCL 1 MG/ML IJ SOLN
0.2500 mg | INTRAMUSCULAR | Status: DC | PRN
  Administered 2022-02-11 (×4): 0.5 mg via INTRAVENOUS

## 2022-02-11 MED ORDER — LACTATED RINGERS IV SOLN: INTRAVENOUS | Status: DC | PRN

## 2022-02-11 MED ORDER — OXYCODONE HCL 5 MG/5ML PO SOLN: 5.0000 mg | Freq: Once | ORAL | Status: DC | PRN

## 2022-02-11 MED ORDER — KETOROLAC TROMETHAMINE 15 MG/ML IJ SOLN
15.0000 mg | Freq: Three times a day (TID) | INTRAMUSCULAR | Status: AC
  Administered 2022-02-11 – 2022-02-16 (×15): 15 mg via INTRAVENOUS
  Filled 2022-02-11 (×15): qty 1

## 2022-02-11 MED ORDER — CHLORHEXIDINE GLUCONATE CLOTH 2 % EX PADS
6.0000 | MEDICATED_PAD | Freq: Every day | CUTANEOUS | Status: DC
  Administered 2022-02-11 – 2022-02-20 (×9): 6 via TOPICAL

## 2022-02-11 MED ORDER — NALOXONE HCL 0.4 MG/ML IJ SOLN: 0.4000 mg | INTRAMUSCULAR | Status: DC | PRN

## 2022-02-11 MED ORDER — ALBUMIN HUMAN 5 % IV SOLN
INTRAVENOUS | Status: DC | PRN
Start: 2022-02-11 — End: 2022-02-11

## 2022-02-11 MED ORDER — MIDAZOLAM HCL 2 MG/2ML IJ SOLN
INTRAMUSCULAR | Status: DC | PRN
  Administered 2022-02-11: 2 mg via INTRAVENOUS

## 2022-02-11 MED ORDER — MIDAZOLAM HCL 2 MG/2ML IJ SOLN
INTRAMUSCULAR | Status: AC
  Filled 2022-02-11: qty 2

## 2022-02-11 MED ORDER — PROPOFOL 10 MG/ML IV BOLUS
INTRAVENOUS | Status: DC | PRN
Start: 2022-02-11 — End: 2022-02-11
  Administered 2022-02-11: 40 mg via INTRAVENOUS
  Administered 2022-02-11: 140 mg via INTRAVENOUS

## 2022-02-11 MED ORDER — DEXAMETHASONE SODIUM PHOSPHATE 10 MG/ML IJ SOLN
INTRAMUSCULAR | Status: AC
  Filled 2022-02-11: qty 1

## 2022-02-11 MED ORDER — ONDANSETRON HCL 4 MG/2ML IJ SOLN: 4.0000 mg | Freq: Once | INTRAMUSCULAR | Status: DC | PRN

## 2022-02-11 MED ORDER — PHENYLEPHRINE HCL-NACL 20-0.9 MG/250ML-% IV SOLN
INTRAVENOUS | Status: DC | PRN
  Administered 2022-02-11: 25 ug/min via INTRAVENOUS

## 2022-02-11 MED ORDER — SUGAMMADEX SODIUM 200 MG/2ML IV SOLN
INTRAVENOUS | Status: DC | PRN
  Administered 2022-02-11: 200 mg via INTRAVENOUS

## 2022-02-11 SURGICAL SUPPLY — 115 items
BAG COUNTER SPONGE SURGICOUNT (BAG) ×7 IMPLANT
BINDER ABDOMINAL 12 ML 46-62 (SOFTGOODS) ×2 IMPLANT
BIOPATCH RED 1 DISK 7.0 (GAUZE/BANDAGES/DRESSINGS) ×2 IMPLANT
BLADE CLIPPER SURG (BLADE) ×3 IMPLANT
CHLORAPREP W/TINT 26 (MISCELLANEOUS) ×4 IMPLANT
CLIP VESOCCLUDE SM WIDE 24/CT (CLIP) ×3 IMPLANT
COVER SURGICAL LIGHT HANDLE (MISCELLANEOUS) ×3 IMPLANT
DEFOGGER SCOPE WARMER CLEARIFY (MISCELLANEOUS) ×3 IMPLANT
DERMABOND ADVANCED (GAUZE/BANDAGES/DRESSINGS) ×2
DERMABOND ADVANCED .7 DNX12 (GAUZE/BANDAGES/DRESSINGS) ×3 IMPLANT
DRAIN CHANNEL 19F RND (DRAIN) IMPLANT
DRAPE INCISE IOBAN 66X45 STRL (DRAPES) ×3 IMPLANT
DRAPE WARM FLUID 44X44 (DRAPES) ×3 IMPLANT
DRSG TEGADERM 4X4.75 (GAUZE/BANDAGES/DRESSINGS) ×2 IMPLANT
ELECT BLADE 4.0 EZ CLEAN MEGAD (MISCELLANEOUS) ×3
ELECT BLADE 6.5 EXT (BLADE) ×3 IMPLANT
ELECT PAD DSPR THERM+ ADLT (MISCELLANEOUS) ×3 IMPLANT
ELECT REM PT RETURN 9FT ADLT (ELECTROSURGICAL) ×3
ELECTRODE BLDE 4.0 EZ CLN MEGD (MISCELLANEOUS) ×2 IMPLANT
ELECTRODE REM PT RTRN 9FT ADLT (ELECTROSURGICAL) ×3 IMPLANT
EVACUATOR SILICONE 100CC (DRAIN) ×2 IMPLANT
GAUZE 4X4 16PLY ~~LOC~~+RFID DBL (SPONGE) ×3 IMPLANT
GAUZE SPONGE 2X2 8PLY STRL LF (GAUZE/BANDAGES/DRESSINGS) ×1 IMPLANT
GLOVE SURG ENC MOIS LTX SZ7.5 (GLOVE) ×2 IMPLANT
GLOVE SURG POLY MICRO LF SZ5.5 (GLOVE) ×3 IMPLANT
GLOVE SURG UNDER POLY LF SZ6 (GLOVE) ×3 IMPLANT
GOWN STRL REUS W/ TWL LRG LVL3 (GOWN DISPOSABLE) ×10 IMPLANT
GOWN STRL REUS W/ TWL XL LVL3 (GOWN DISPOSABLE) ×2 IMPLANT
GOWN STRL REUS W/TWL LRG LVL3 (GOWN DISPOSABLE) ×15
GOWN STRL REUS W/TWL XL LVL3 (GOWN DISPOSABLE) ×2
GRASPER SUT TROCAR 14GX15 (MISCELLANEOUS) ×1 IMPLANT
HAND PENCIL TRP OPTION (MISCELLANEOUS) ×3 IMPLANT
HANDLE STAPLE ENDO GIA SHORT (STAPLE) ×1
HANDLE SUCTION POOLE (INSTRUMENTS) ×2 IMPLANT
HEMOSTAT SNOW SURGICEL 2X4 (HEMOSTASIS) ×2 IMPLANT
KIT BASIN OR (CUSTOM PROCEDURE TRAY) ×4 IMPLANT
KIT TUBE JEJUNAL 16FR (CATHETERS) ×2 IMPLANT
KIT TURNOVER KIT B (KITS) ×4 IMPLANT
LIGASURE IMPACT 36 18CM CVD LR (INSTRUMENTS) ×2 IMPLANT
LIGASURE LAP MARYLAND 5MM 37CM (ELECTROSURGICAL) ×1 IMPLANT
NDL INSUFFLATION 14GA 120MM (NEEDLE) ×1 IMPLANT
NEEDLE INSUFFLATION 14GA 120MM (NEEDLE) ×3 IMPLANT
NS IRRIG 1000ML POUR BTL (IV SOLUTION) ×8 IMPLANT
PACK CHEST (CUSTOM PROCEDURE TRAY) ×1 IMPLANT
PACK LAPAROSCOPY I 1258 (SET/KITS/TRAYS/PACK) ×3 IMPLANT
PACK UNIVERSAL I (CUSTOM PROCEDURE TRAY) ×1 IMPLANT
PAD ARMBOARD 7.5X6 YLW CONV (MISCELLANEOUS) ×8 IMPLANT
PASSER SUT SWANSON 36MM LOOP (INSTRUMENTS) IMPLANT
PENCIL SMOKE EVACUATOR (MISCELLANEOUS) ×3 IMPLANT
RELOAD EGIA 45 MED/THCK PURPLE (STAPLE) ×2 IMPLANT
RELOAD ENDO STITCH (ENDOMECHANICALS) ×3 IMPLANT
RELOAD PROXIMATE 75MM BLUE (ENDOMECHANICALS) ×3 IMPLANT
RELOAD PROXIMATE 75MM GREEN (ENDOMECHANICALS) IMPLANT
RELOAD STAPLE 75 3.8 BLU REG (ENDOMECHANICALS) IMPLANT
RELOAD STAPLE 75 4.5 GRN THCK (ENDOMECHANICALS) IMPLANT
RELOAD SUT TRIPLE-STITCH 2-0 (ENDOMECHANICALS) ×8 IMPLANT
RETRACTOR WOUND ALXS 34CM XLRG (MISCELLANEOUS) ×2 IMPLANT
RTRCTR WOUND ALEXIS 34CM XLRG (MISCELLANEOUS) ×3
SEALER LIGASURE MARYLAND 30 (ELECTROSURGICAL) ×1 IMPLANT
SET TUBE SMOKE EVAC HIGH FLOW (TUBING) ×3 IMPLANT
SLEEVE ENDOPATH XCEL 5M (ENDOMECHANICALS) ×4 IMPLANT
SPONGE GAUZE 2X2 STER 10/PKG (GAUZE/BANDAGES/DRESSINGS) ×1
SPONGE T-LAP 18X18 ~~LOC~~+RFID (SPONGE) ×14 IMPLANT
STAPLER ENDO GIA 12 SHRT THIN (STAPLE) IMPLANT
STAPLER ENDO GIA 12MM SHORT (STAPLE) ×2 IMPLANT
STAPLER GUN LINEAR PROX 60 (STAPLE) ×2 IMPLANT
STAPLER PROXIMATE 75MM BLUE (STAPLE) ×2 IMPLANT
STAPLER VISISTAT 35W (STAPLE) ×1 IMPLANT
STOPCOCK 4 WAY LG BORE MALE ST (IV SETS) IMPLANT
SUCTION POOLE HANDLE (INSTRUMENTS) ×3
SUT ETHILON 2 0 FS 18 (SUTURE) ×4 IMPLANT
SUT MNCRL AB 4-0 PS2 18 (SUTURE) ×5 IMPLANT
SUT PDS AB 1 TP1 96 (SUTURE) ×6 IMPLANT
SUT PDS AB 3-0 SH 27 (SUTURE) ×4 IMPLANT
SUT PROLENE 3 0 SH DA (SUTURE) IMPLANT
SUT PROLENE 4 0 RB 1 (SUTURE) ×2
SUT PROLENE 4-0 RB1 .5 CRCL 36 (SUTURE) ×2 IMPLANT
SUT SILK  1 MH (SUTURE)
SUT SILK 1 MH (SUTURE) ×2 IMPLANT
SUT SILK 1 TIES 10X30 (SUTURE) IMPLANT
SUT SILK 2 0 TIES 10X30 (SUTURE) ×3 IMPLANT
SUT SILK 2 0SH CR/8 30 (SUTURE) ×5 IMPLANT
SUT SILK 3 0 TIES 10X30 (SUTURE) ×3 IMPLANT
SUT SILK 3 0SH CR/8 30 (SUTURE) ×11 IMPLANT
SUT VIC AB 1 CTX 18 (SUTURE) IMPLANT
SUT VIC AB 1 CTX 36 (SUTURE)
SUT VIC AB 1 CTX36XBRD ANBCTR (SUTURE) IMPLANT
SUT VIC AB 2-0 CT1 27 (SUTURE)
SUT VIC AB 2-0 CT1 TAPERPNT 27 (SUTURE) ×1 IMPLANT
SUT VIC AB 2-0 CTX 36 (SUTURE) IMPLANT
SUT VIC AB 3-0 MH 27 (SUTURE) IMPLANT
SUT VIC AB 3-0 SH 27 (SUTURE) ×2
SUT VIC AB 3-0 SH 27X BRD (SUTURE) ×5 IMPLANT
SUT VIC AB 3-0 X1 27 (SUTURE) IMPLANT
SUT VICRYL 0 UR6 27IN ABS (SUTURE) ×2 IMPLANT
SUT VICRYL 2 TP 1 (SUTURE) IMPLANT
SUT VLOC 90 P-14 23 (SUTURE) ×4 IMPLANT
SYR 10ML LL (SYRINGE) IMPLANT
SYR 20ML LL LF (SYRINGE) ×1 IMPLANT
SYR 50ML LL SCALE MARK (SYRINGE) IMPLANT
SYR BULB IRRIG 60ML STRL (SYRINGE) ×2 IMPLANT
SYR TOOMEY IRRIG 70ML (MISCELLANEOUS) ×3
SYRINGE TOOMEY IRRIG 70ML (MISCELLANEOUS) ×1 IMPLANT
SYSTEM SAHARA CHEST DRAIN ATS (WOUND CARE) ×2 IMPLANT
TOWEL GREEN STERILE (TOWEL DISPOSABLE) ×9 IMPLANT
TOWEL GREEN STERILE FF (TOWEL DISPOSABLE) ×6 IMPLANT
TRAY FOLEY MTR SLVR 16FR STAT (SET/KITS/TRAYS/PACK) ×3 IMPLANT
TRAY LAPAROSCOPIC MC (CUSTOM PROCEDURE TRAY) ×3 IMPLANT
TROCAR XCEL 12X100 BLDLESS (ENDOMECHANICALS) ×1 IMPLANT
TROCAR XCEL NON-BLD 5MMX100MML (ENDOMECHANICALS) ×4 IMPLANT
TUBE CONNECTING 12X1/4 (SUCTIONS) ×3 IMPLANT
TUBING LAP HI FLOW INSUFFLATIO (TUBING) ×3 IMPLANT
WARMER LAPAROSCOPE (MISCELLANEOUS) ×3 IMPLANT
WIRE EMERALD 3MM-J .035X150CM (WIRE) ×1 IMPLANT
YANKAUER SUCT BULB TIP NO VENT (SUCTIONS) ×2 IMPLANT

## 2022-02-11 NOTE — Anesthesia Procedure Notes (Signed)
Procedure Name: Intubation ?Date/Time: 02/11/2022 7:40 AM ?Performed by: Valda Favia, CRNA ?Pre-anesthesia Checklist: Patient identified, Emergency Drugs available, Suction available and Patient being monitored ?Patient Re-evaluated:Patient Re-evaluated prior to induction ?Oxygen Delivery Method: Circle System Utilized ?Preoxygenation: Pre-oxygenation with 100% oxygen ?Induction Type: IV induction ?Ventilation: Mask ventilation without difficulty ?Laryngoscope Size: Mac and 4 ?Grade View: Grade I ?Tube type: Oral ?Tube size: 8.0 mm ?Number of attempts: 1 ?Airway Equipment and Method: Stylet ?Placement Confirmation: ETT inserted through vocal cords under direct vision, positive ETCO2 and breath sounds checked- equal and bilateral ?Secured at: 23 cm ?Tube secured with: Tape ?Dental Injury: Teeth and Oropharynx as per pre-operative assessment  ? ? ? ? ?

## 2022-02-11 NOTE — Progress Notes (Signed)
?  Transition of Care (TOC) Screening Note ? ? ?Patient Details  ?Name: Rannie Craney ?Date of Birth: 1956/01/01 ? ? ?Transition of Care [facility]) CM/SW Contact:    ?Ella Bodo, RN ?Phone Number: ?02/11/2022, 4:59 PM ? ? ? ?Transition of Care Department [facility]) has reviewed patient and no TOC needs have been identified at this time. We will continue to monitor patient advancement through interdisciplinary progression rounds. If new patient transition needs arise, please place a TOC consult. ? ?Reinaldo Raddle, RN, BSN  ?Trauma/Neuro ICU Case Manager ?[phone] ? ?

## 2022-02-11 NOTE — Anesthesia Procedure Notes (Signed)
Arterial Line Insertion ?Start/End3/01/2022 7:40 AM, 02/11/2022 7:45 AM ?Performed by: Colin Benton, CRNA, CRNA ? Patient location: OR. ?Preanesthetic checklist: patient identified, IV checked, site marked, risks and benefits discussed, surgical consent, monitors and equipment checked, pre-op evaluation, timeout performed and anesthesia consent ?Right, radial was placed ?Catheter size: 20 G ?Hand hygiene performed , maximum sterile barriers used  and Seldinger technique used ?Allen's test indicative of satisfactory collateral circulation ?Attempts: 1 ?Procedure performed without using ultrasound guided technique. ?Following insertion, dressing applied and Biopatch. ?Post procedure assessment: normal and unchanged ? ?Patient tolerated the procedure well with no immediate complications. ? ? ?

## 2022-02-11 NOTE — Op Note (Signed)
Date: 02/11/22  Patient: Zachary Bowers MRN: [id number]  Preoperative Diagnosis: Gastric adenocarcinoma Postoperative Diagnosis: Same  Procedure:  Staging laparoscopy Open total gastrectomy with roux-en-Y esophagojejunostomy, D2 lymphadenectomy, and omentectomy  Surgeon: Michaelle Birks, MD Assistant: Stark Klein, MD  Co-Surgeon: Melodie Bouillon, MD  EBL: 100 mL  Anesthesia: General endotracheal  Specimens:  Total gastrectomy Left gastric lymph nodes  Indications: [name] is a 66 yo male who presented with GI bleeding and was found to have a mass in the gastric cardia very close to the GE junction. He was treated with neoadjuvant chemoradiation and repeat staging PET showed a partial response of the primary tumor. After an extensive discussion of the risks and benefits of surgery, he agreed to proceed with total gastrectomy.  Findings: No evidence of metastatic disease within the abdomen. Mass in the proximal stomach on the cardia, near the GE junction but no gross involvement of the GEJ. The proximal margin was negative for malignant cells on frozen section. Replaced left hepatic artery was carefully preserved during the dissection.  Procedure details: Informed consent was obtained in the preoperative area prior to the procedure. The patient was brought to the operating room and placed on the table in the supine position. General anesthesia was induced and appropriate lines and drains were placed for intraoperative monitoring. Perioperative antibiotics were administered per SCIP guidelines. The abdomen was prepped and draped in the usual sterile fashion. A pre-procedure timeout was taken verifying patient identity, surgical site and procedure to be performed.  A supraumbilical skin incision was made, and the subcutaneous tissue was divided with cautery. The umbilical stalk was grasped and elevated, and a Veress needle was inserted through the fascia.  Intraperitoneal placement was  confirmed with the saline drop test and the abdomen was insufflated. The peritoneal cavity was inspected, including the liver, both hemidiaphragms, and the peritoneal surface throughout the abdomen. There was no evidence of metastatic disease. The port was removed and the abdomen was desufflated.  An upper midline skin incision was made, the subcutaneous tissue was divided with cautery, and the fascia was opened along the linea alba to enter the peritoneal cavity.  The falciform ligament was taken down off the abdominal wall and ligated with LigaSure.  An Allexis wound protector and Bookwalter fixed retractor were placed.  The stomach was palpated and there was a firm mass in the proximal stomach near the cardia, consistent with the preoperative endoscopy findings.  The stomach was otherwise normal in appearance.  The liver was palpated and there were no nodules.  The duodenum was kocherized using cautery.  The omentum was separated from the colon using cautery.  The omentum was then carefully separated from the transverse mesocolon using gentle blunt dissection and cautery, using the LigaSure as needed to divide small blood vessels, and the lesser sac was opened.  The pars flaccida was opened with cautery.  The pylorus was palpated, and the right gastric artery was identified by palpation.  The artery was clamped, and a pulse remained in the porta.  The right hepatic artery was ligated with a 2-0 silk tie and divided, sweeping the adjacent lymphatic tissue up with the planned specimen.  The gastroepiploic vessels were then circumferentially dissected out, ligated with a 2-0 silk tie and divided.  The proximal duodenum was circumferentially dissected out just distal to the pylorus, and transected with a 75 mm GIA stapler with a blue load.  Next the short gastric vessels were divided with the LigaSure to completely mobilize the  greater curve of the stomach.  The dissection on the lesser curve was then started.  A  replaced left hepatic artery was identified coming off the left gastric artery, and this was carefully preserved.  A large firm lymph node was palpated adjacent to the left gastric artery.  The left gastric artery was circumferentially dissected out and clamped just distal to the takeoff of the replaced left hepatic artery.  A pulse remained in the left hepatic artery.  The left gastric artery was then suture-ligated with a 2-0 silk and divided.  The adjacent lymphatic tissue was swept towards the specimen.  The enlarged lymph node was dissected free of the root of the left gastric artery using cautery and blunt dissection, and kept en bloc with the specimen.  The peritoneum overlying the crura was opened with cautery.  The GE junction and distal esophagus were then circumferentially dissected out using cautery and gentle blunt dissection.  The anterior vagus nerve was clipped proximally and sharply divided.  Once the distal esophagus had been circumferentially dissected out, the GE junction was palpable and felt soft and free of tumor.  2-0 silk stay sutures were placed on the esophagus, and the esophagus was then transected just proximal to the GE junction using a TA-60 stapler with a blue load.  The stomach, adjacent lymphatic tissue, and omentum were then removed en bloc in the proximal margin was marked with a suture.  The specimen was sent for pathology and frozen analysis of the proximal margin was negative for malignant cells.  An additional packet of lymph nodes was excised adjacent to the replaced left hepatic artery and left gastric artery, and this was sent as a separate specimen to pathology as left gastric lymph nodes.  There remained a palpable pulse within the replaced left hepatic artery.  The ligament of Treitz was identified, and approximately 30 cm distal to this a mesenteric window was created in the jejunum with cautery.  The jejunum was then transected with a GIA 75 mm blue load stapler.   The mesentery was partially divided with LigaSure to allow further mobility of the distal jejunum to create a Roux limb.  The distal transected end of jejunum was then brought up to the end of the esophagus, taking care not to twist the mesentery.  The jejunum reached the esophagus easily with no tension.  At this point Dr. Kipp Brood performed the esophagojejunal anastomosis.  Please see his separately dictated operative note for further details of this portion of the procedure. The NG tube was advanced and guided across the anastomosis.  A side-to-side jejunojejunal anastomosis was then created by anastomosing the pancreaticobiliary limb to the jejunum approximately 50 cm distal to the EJ anastomosis.  The anastomosis was created with an outer row of interrupted 3-0 silk Lembert sutures, and an inner row of running 3-0 PDS suture.  At the completion of the anastomosis it was widely patent.  The duodenal stump was oversewn with 3-0 silk Lembert sutures.  The Roux limb was manually clamped just distal to the tip of the NG tube, warm saline was placed in the abdomen, and air was insufflated across the anastomosis through the NG tube to perform a leak test.  There was no evidence of anastomotic leak.  The abdomen was irrigated with warm saline and appeared hemostatic.  The transverse colon was closely inspected and was pink and well-perfused with no signs of injury.  A 19 [place] JP drain was placed posterior to the EJ anastomosis  and adjacent to the duodenal stump, and brought out through the right lateral abdominal wall.  It was secured to the skin with a 2-0 nylon suture.  The wound protector was removed.  A point on the jejunum approximately 15 cm distal to the JJ anastomosis was selected for placement of a feeding jejunostomy tube.  2 concentric pursestring sutures were placed with 2-0 silk suture, and an enterotomy was made at the center of the sutures.  A 16-French jejunostomy tube was cut to size, and was  passed through the left lateral abdominal wall and advanced into the jejunum.  The pursestring sutures were tied down.  A Witzel tunnel was created with 2-0 silk suture.  The balloon was then inflated with 2 mL sterile water.  The lumen of the bowel was palpated around the J-tube and balloon and was patent.  The jejunum was then pexied to the abdominal wall around the J-tube using 2-0 silk suture.  The bumper was cinched down and secured to the skin with 2-0 nylon suture.  The Bookwalter was removed.  The fascia was closed at midline with a running looped 1 PDS suture.  Scarpa's layer was closed with a running 3-0 Vicryl suture, and the skin was closed with a running subcuticular 4-0 Monocryl suture.  Dermabond was applied.  The patient tolerated the procedure well with no apparent complications.  All counts were correct x2 at the end of the procedure. The patient was extubated and taken to PACU in stable condition.  Michaelle Birks, MD 02/11/22 1:29 PM

## 2022-02-11 NOTE — Interval H&P Note (Signed)
History and Physical Interval Note: ? ?02/11/2022 ?7:13 AM ? ?Zachary Bowers  has presented today for surgery, with the diagnosis of GASTRIC CANCER.  The various methods of treatment have been discussed with the patient and family. After consideration of risks, benefits and other options for treatment, the patient has consented to  Procedure(s): ?OPEN TOTAL GASTRECTOMY (N/A) ?FEEDING  TUBE (N/A) ?ESOPHAGECTOMY PARTIAL (N/A) ?STAGING LAPAROSCOPY (N/A) ?possible,VIDEO ASSISTED THORACOSCOPY (N/A) ?possible, ESOPHAGOJEJUNOSTOMY (N/A) as a surgical intervention.  The patient's history has been reviewed, patient examined, no change in status, stable for surgery.  I have reviewed the patient's chart and labs.  Questions were answered to the patient's satisfaction.   ? ? ?[name] ? ? ?

## 2022-02-11 NOTE — Op Note (Signed)
? ?   ?  [address] ?      [name] [address] ?            [phone]      ? ? ?02/12/2022 ? ?Patient:  Zachary Bowers ?Pre-Op Dx: Gastric adenocarcinoma ?Post-op Dx: Same ?Procedure: ?-Esophagojejunostomy ?All of the procedures were performed by Dr. Zenia Resides and Dr. Barry Dienes.  Please refer to their notes for further details. ? ? ?Surgeon and Role:   ?   * Lajuana Matte, MD -for esophagojejunostomy ?   *Dr. Antony Haste, MD - Co-surgeon ? ? ? ?Indications: ?This is a 66 year old gentleman that was diagnosed with gastric adenocarcinoma.  He was treated with neoadjuvant therapy prior to surgery.  The primary tumor was located high in the stomach close to the GE junction, and I was asked to assist with the esophagojejunostomy.   ? ? ? ?Operative Technique: ?After the risks, benefits and alternatives were thoroughly discussed, the patient was brought to the operative theatre.  Anesthesia was induced, and the procedure was begun by Dr. Zenia Resides and Dr. Barry Dienes.  Please refer to their note for surgical details.  After the proximal margins were confirmed to be negative for cancer I was called into the room to assist with the esophagojejunostomy. ? ?Viable portion of jejunum was created by Dr. Antony Haste for the esophagojejunostomy.  A small enterotomy was created.  The staple line along the distal portion of the esophagus was divided sharply.  The mucosa of the esophagus was identified.  Using a Covidien 45 mm endostapler a end to side anastomosis was created between the esophagus and jejunum to create a stapled back row of the anastomosis. ? ?Next using a 3 oh V-Loc suture.  The first layer was handsewn.  We ensured that there was good mucosal apposition between the jejunum and the esophagus.  Lembert stitches were then placed between the serosal surface of the jejunum and the esophagus.  And then a viable portion of the falciform ligament was pexied to the anastomosis. ? ? ?Lajuana Matte ? ?

## 2022-02-11 NOTE — Discharge Instructions (Addendum)
[facility] DISCHARGE INSTRUCTIONS ? ?Activity ?No heavy lifting greater than 15 pounds for 6 weeks after surgery. ?Ok to shower, but do not bathe or submerge incision underwater. ?Do not drive while taking narcotic pain medication. ?Be sure to walk around your home at least 3 times daily. This will help avoid blood clots and will improve your strength. ? ?Wound Care ?Your incision is covered with skin glue called Dermabond. This will peel off on its own over time. ?You may shower and allow warm soapy water to run over your incisions. Gently pat dry. ?Do not submerge your incision underwater. ?Monitor your incision for any new redness, tenderness, or drainage. ? ?Medications ?You may take Tylenol up to 4 times daily for pain. ?If you have more severe pain that is not covered by Tylenol, you may take oxycodone up to every 6 hours as needed. Please reserve this medication only for the most severe pain. ?You should stop taking your metformin and glimepiride (Amaryl) for now. You will instead be on insulin for now. You should see your primary care doctor within 1-2 weeks of discharge from the hospital to discuss your diabetes medications. ? ? ?Diet ?You will feel full very early after meals - this is normal after removal of the stomach. Over time, you will gradually be able to increase the amount that you eat. ?You should stick to eating only very soft or pureed foods until your postoperative visit. ?It is better to consume small frequent meals throughout the day rather than 3 large meals. ?Avoid drinking carbonated beverages, as these can worsen symptoms of nausea. ?If you are having frequent vomiting after meals or are unable to keep down any food or drink, please call the office right away. ?You will need to stay on tube feedings until you are able to eat enough on your own to meet your nutritional needs. Dr. Zenia Resides will assess this at your first postoperative visit. Your feeding tube will remain in place  for at least 4 weeks after surgery. ? ?Feeding Tube Care: ?Do NOT give any pills or crushed medications through your feeding tube - this will cause the tube to clog. ?The only things that should be given through the feeding tube are feeds and liquid medications.  ?Be sure to flush your feeding tube before and after any use, and before and after giving any medications through it. You can flush it using a syringe filled with 30 mL of tap water. ? ?When to Call us: ?Fever greater than 100.5 ?New redness, drainage, or swelling at incision site ?Severe pain, nausea, or vomiting ?Excessive vomiting or inability to keep down any liquids ? ?Follow-up ?You will have an appointment scheduled with Dr. Zenia Resides 2 weeks after you leave the hospital. This will be at the [facility] office at [address] [place], [place], [place]. Please arrive at least 15 minutes prior to your scheduled appointment time. ? ?For questions or concerns, please call the office at [phone].  ?

## 2022-02-11 NOTE — Transfer of Care (Signed)
Immediate Anesthesia Transfer of Care Note ? ?Patient: Zachary Bowers ? ?Procedure(s) Performed: OPEN TOTAL GASTRECTOMY (Abdomen) ?FEEDING  JEJUNOSTOMY TUBE (Abdomen) ?STAGING LAPAROSCOPY (Abdomen) ?ESOPHAGOJEJUNOSTOMY (Abdomen) ? ?Patient Location: PACU ? ?Anesthesia Type:General ? ?Level of Consciousness: drowsy ? ?Airway & Oxygen Therapy: Patient Spontanous Breathing and Patient connected to face mask oxygen ? ?Post-op Assessment: Report given to RN and Post -op Vital signs reviewed and stable ? ?Post vital signs: Reviewed and stable ? ?Last Vitals:  ?Vitals Value Taken Time  ?BP 108/66 02/11/22 1309  ?Temp    ?Pulse 67 02/11/22 1317  ?Resp 29 02/11/22 1317  ?SpO2 100 % 02/11/22 1317  ?Vitals shown include unvalidated device data. ? ?Last Pain:  ?Vitals:  ? 02/11/22 0610  ?TempSrc:   ?PainSc: 0-No pain  ?   ? ?  ? ?Complications: No notable events documented. ?

## 2022-02-11 NOTE — Anesthesia Postprocedure Evaluation (Signed)
Anesthesia Post Note ? ?Patient: Zachary Bowers ? ?Procedure(s) Performed: OPEN TOTAL GASTRECTOMY (Abdomen) ?FEEDING  JEJUNOSTOMY TUBE (Abdomen) ?STAGING LAPAROSCOPY (Abdomen) ?ESOPHAGOJEJUNOSTOMY (Abdomen) ? ?  ? ?Patient location during evaluation: PACU ?Anesthesia Type: General ?Level of consciousness: awake and alert ?Pain management: pain level controlled ?Vital Signs Assessment: post-procedure vital signs reviewed and stable ?Respiratory status: spontaneous breathing, nonlabored ventilation, respiratory function stable and patient connected to nasal cannula oxygen ?Cardiovascular status: blood pressure returned to baseline and stable ?Postop Assessment: no apparent nausea or vomiting ?Anesthetic complications: no ? ? ?No notable events documented. ? ?Last Vitals:  ?Vitals:  ? 02/11/22 1425 02/11/22 1440  ?BP: 120/68 120/67  ?Pulse: 76 79  ?Resp: 20 19  ?Temp:  (!) 36.1 ?C  ?SpO2: 94% 99%  ?  ?Last Pain:  ?Vitals:  ? 02/11/22 1440  ?TempSrc:   ?PainSc: Asleep  ? ? ?  ?  ?  ?  ?  ?  ? ?Barnet Glasgow ? ? ? ? ?

## 2022-02-12 ENCOUNTER — Encounter (HOSPITAL_COMMUNITY): Payer: Self-pay | Admitting: Surgery

## 2022-02-12 LAB — BASIC METABOLIC PANEL
Anion gap: 7 (ref 5–15)
BUN: 14 mg/dL (ref 8–23)
CO2: 23 mmol/L (ref 22–32)
Calcium: 8.7 mg/dL — ABNORMAL LOW (ref 8.9–10.3)
Chloride: 104 mmol/L (ref 98–111)
Creatinine, Ser: 0.99 mg/dL (ref 0.61–1.24)
GFR, Estimated: >60 mL/min (ref >60)
Glucose, Bld: 163 mg/dL — ABNORMAL HIGH (ref 70–99)
Potassium: 3.9 mmol/L (ref 3.5–5.1)
Sodium: 134 mmol/L — ABNORMAL LOW (ref 135–145)

## 2022-02-12 LAB — GLUCOSE, CAPILLARY
Glucose-Capillary: 195 mg/dL — ABNORMAL HIGH (ref 70–99)
Glucose-Capillary: 147 mg/dL — ABNORMAL HIGH (ref 70–99)
Glucose-Capillary: 134 mg/dL — ABNORMAL HIGH (ref 70–99)
Glucose-Capillary: 146 mg/dL — ABNORMAL HIGH (ref 70–99)
Glucose-Capillary: 157 mg/dL — ABNORMAL HIGH (ref 70–99)

## 2022-02-12 LAB — CBC
HCT: 32.1 % — ABNORMAL LOW (ref 39.0–52.0)
Hemoglobin: 10.3 g/dL — ABNORMAL LOW (ref 13.0–17.0)
MCH: 26.8 pg (ref 26.0–34.0)
MCHC: 32.1 g/dL (ref 30.0–36.0)
MCV: 83.4 fL (ref 80.0–100.0)
Platelets: 191 10*3/uL (ref 150–400)
RBC: 3.85 MIL/uL — ABNORMAL LOW (ref 4.22–5.81)
RDW: 17.2 % — ABNORMAL HIGH (ref 11.5–15.5)
WBC: 8.6 10*3/uL (ref 4.0–10.5)
nRBC: 0 % (ref 0.0–0.2)

## 2022-02-12 LAB — MAGNESIUM: Magnesium: 2 mg/dL (ref 1.7–2.4)

## 2022-02-12 MED ORDER — VITAL 1.5 CAL PO LIQD
1000.0000 mL | ORAL | Status: DC
  Administered 2022-02-12 (×2): 1000 mL
  Filled 2022-02-12 (×2): qty 1000

## 2022-02-12 MED ORDER — VITAL HIGH PROTEIN PO LIQD
1000.0000 mL | ORAL | Status: AC
  Administered 2022-02-12: 1000 mL

## 2022-02-12 NOTE — Progress Notes (Signed)
Initial Nutrition Assessment ? ?DOCUMENTATION CODES:  ? ?Obesity unspecified ? ?INTERVENTION:  ? ?-D/c Vital High Protein ? ?-Continue trickle feeds: ? ?Vital 1.5 @ 10 ml/hr via j-tube ? ?Tube feeding regimen provides 360 kcal (15% of needs), 16 grams of protein, and 183 ml of H2O.   ? ?Advance TF per MD: ? ?Vital 1.5 @ 10 ml/hr via j-tube and increase to goal rate of 60 ml/hr.  ? ?45 ml Prosource TF TID.   ? ?If no IVFS, 140 ml free water flush every 4 hours ? ?Tube feeding regimen provides 2280 kcal (95% of needs), 130 grams of protein, and 1168 ml of H2O.  Total free water: 2008 ? ?NUTRITION DIAGNOSIS:  ? ?Increased nutrient needs related to post-op healing as evidenced by estimated needs. ? ?GOAL:  ? ?Patient will meet greater than or equal to 90% of their needs ? ?MONITOR:  ? ?Labs, Weight trends, TF tolerance, Skin, I & O's ? ?REASON FOR ASSESSMENT:  ? ?Consult ?Assessment of nutrition requirement/status ? ?ASSESSMENT:  ? ?Zachary Bowers is a 66 y.o. 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 After discussion at [facility], the decision was made to treat as a GEJ cancer, and he completed neoadjuvant chemoradiation (taxol/carboplatin completed 12/18/21, radiation completed 12/22/21). He had a restaging PET/CT done yesterday, which showed a partial response of his primary tumor to treatment and no evidence of distant metastatic disease. Today he says he is overall feeling well. He had some epigastric and chest pain during radiation, but this has improved. He lost about 30 pounds during treatment, but he is now eating more and his weight today is stable compared to weight 2 weeks ago. ? ?Pt admitted with gastric adenocarcinoma.  ? ?3/2- s/p Procedure:  ?Staging laparoscopy ?Open total gastrectomy with roux-en-Y esophagojejunostomy, D2 lymphadenectomy, and omentectomy ? ?Reviewed I/O's: -385 ml x 24 hours ? ?UOP: 2.5 L x 24 hours ? ?NGT output: 0 ml x 24 hours ? ?Drain  output: 325 ml x 24 hours ? ?Pt unavailable at time of visit. Attempted to speak with pt via call to hospital room phone, however, unable to reach. RD unable to obtain further nutrition-related history or complete nutrition-focused physical exam at this time.    ? ?DM coordinator consulted for insulin needs. Per MD notes, plan to transfer to floor today.  ? ?Per MD, plan for trickle feeds at 10 ml/hr with no plans for advancement yet. Vital High Protein infusing via j-tube at 10 ml/hr. Case discussed with Dr. Zenia Resides; received permission to change TF to recommend formula at goal rate.  ? ?Reviewed wt hx; pt has experienced a 6.6% wt loss over the past 3 months, which is not significant for time frame, however, concerning given diagnosis of gastric cancer.  ? ?Medications reviewed and include lactated ringers infusion @ 75 ml/hr.  ? ?Lab Results  ?Component Value Date  ? HGBA1C 6.6 (H) 02/10/2022  ? PTA DM medications are 4 mg amaryl daily and 500 mg metformin BID.  ? ?Labs reviewed: Na: 134, CBGS: 147-266 (inpatient orders for glycemic control are 0-15 units insulin aspart every 4 hours).   ? ?Diet Order:   ?Diet Order   ? ?       ?  Diet NPO time specified  Diet effective now       ?  ? ?  ?  ? ?  ? ? ?EDUCATION NEEDS:  ? ?Not appropriate for education at this  time ? ?Skin:  Skin Assessment: Skin Integrity Issues: ?Skin Integrity Issues:: Incisions ?Incisions: closed abdomen ? ?Last BM:  02/10/22 ? ?Height:  ? ?Ht Readings from Last 1 Encounters:  ?02/11/22 6' (1.829 m)  ? ? ?Weight:  ? ?Wt Readings from Last 1 Encounters:  ?02/11/22 100.7 kg  ? ? ?Ideal Body Weight:  80.9 kg ? ?BMI:  Body mass index is 30.11 kg/m?. ? ?Estimated Nutritional Needs:  ? ?Kcal:  2400-2600 ? ?Protein:  130-145 grams ? ?Fluid:  > 2 L ? ? ? ?[name], RD, LDN, CDCES ?Registered Dietitian II ?Certified Diabetes Care and Education Specialist ?Please refer to [facility] for RD and/or RD on-call/weekend/after hours pager  ?

## 2022-02-12 NOTE — Progress Notes (Signed)
? ? ?1 Day Post-Op  ?Subjective: ?No acute issues overnight. Afebrile, hemodynamically stable, good UOP. Labs unremarkable. Pain well-controlled. Hyperglycemic to 200s. ? ?Objective: ?Vital signs in last 24 hours: ?Temp:  [96.6 ?F (35.9 ?C)-98.5 ?F (36.9 ?C)] 98.5 ?F (36.9 ?C) (03/03 0400) ?Pulse Rate:  [66-108] 91 (03/03 0600) ?Resp:  [14-30] 20 (03/03 0600) ?BP: (108-143)/(62-83) 121/74 (03/03 0600) ?SpO2:  [94 %-100 %] 99 % (03/03 0600) ?Arterial Line BP: (120-169)/(45-68) 140/61 (03/03 0600) ?Last BM Date : 02/10/22 ? ?Intake/Output from previous day: ?03/02 0701 - 03/03 0700 ?In: 2529.9 [I.V.:1920; IV Piggyback:549.9] ?Out: 2915 [Urine:2490; Drains:325; Blood:100] ?Intake/Output this shift: ?No intake/output data recorded. ? ?PE: ?General: resting comfortably, NAD ?Neuro: alert and oriented, no focal deficits ?HEENT: NG in place with minimal bilious drainage ?Resp: normal work of breathing ?CV: RRR ?Abdomen: soft, nondistended, nontender to palpation. Midline incision clean and dry with no erythema or induration, RUQ JP with serosanguinous drainage. J tube in place LUQ, capped. ?Extremities: warm and well-perfused ?GU: foley in place draining clear yellow urine ? ? ?Lab Results:  ?Recent Labs  ?  02/10/22 ?0851 02/11/22 ?0836 02/12/22 ?9924  ?WBC 4.7  --  8.6  ?HGB 11.4* 9.9* 10.3*  ?HCT 35.7* 29.0* 32.1*  ?PLT 196  --  191  ? ?BMET ?Recent Labs  ?  02/10/22 ?0851 02/11/22 ?0836 02/12/22 ?2683  ?NA 138 139 134*  ?K 3.9 4.0 3.9  ?CL 107 104 104  ?CO2 21*  --  23  ?GLUCOSE 166* 135* 163*  ?BUN 13 12 14   ?CREATININE 0.99 0.90 0.99  ?CALCIUM 9.2  --  8.7*  ? ?PT/INR ?Recent Labs  ?  02/10/22 ?0851  ?LABPROT 13.1  ?INR 1.0  ? ?CMP  ?   ?Component Value Date/Time  ? NA 134 (L) 02/12/2022 0508  ? NA 136 08/21/2021 1015  ? K 3.9 02/12/2022 0508  ? CL 104 02/12/2022 0508  ? CO2 23 02/12/2022 0508  ? GLUCOSE 163 (H) 02/12/2022 0508  ? BUN 14 02/12/2022 0508  ? BUN 23 08/21/2021 1015  ? CREATININE 0.99 02/12/2022 0508  ?  CREATININE 0.87 12/17/2021 1116  ? CALCIUM 8.7 (L) 02/12/2022 0508  ? PROT 7.0 02/10/2022 0851  ? ALBUMIN 4.0 02/10/2022 0851  ? AST 18 02/10/2022 0851  ? AST 12 (L) 12/17/2021 1116  ? ALT 19 02/10/2022 0851  ? ALT 15 12/17/2021 1116  ? ALKPHOS 94 02/10/2022 0851  ? BILITOT 0.6 02/10/2022 0851  ? BILITOT 0.5 12/17/2021 1116  ? GFRNONAA >60 02/12/2022 0508  ? GFRNONAA >60 12/17/2021 1116  ? ?Lipase  ?No results found for: LIPASE ? ? ? ? ?Studies/Results: ?DG Chest 2 View ? ?Result Date: 02/11/2022 ?CLINICAL DATA:  Malignant neoplasm of cardia of stomach.  Preop. EXAM: CHEST - 2 VIEW COMPARISON:  Chest radiograph 10/05/2020, PET CT 01/18/2021 FINDINGS: The cardiomediastinal contours are normal. Coronary artery calcifications/stent. The lungs are clear. Pulmonary vasculature is normal. No consolidation, pleural effusion, or pneumothorax. Tiny pulmonary nodules on CT not seen by radiograph. Prominent right first rib costochondral cartilage. No acute osseous abnormalities are seen. IMPRESSION: 1. No acute chest findings. 2. Coronary artery calcifications/stent. Electronically Signed   By: Keith Rake M.D.   On: 02/11/2022 17:47   ? ? ? ? ? ?Assessment/Plan ?66 yo male with gastric adenocarcinoma, POD1 s/p total gastrectomy with roux-en-Y esophagojejunostomy and feeding J tube placement. ?- Remain strict NPO (except small amounts of ice chips) with NG tube to suction until POD5. NG is not  to be manipulated or replaced. ?- No oral meds, no crushed meds via J tube. Will give home aspirin as a rectal suppository.  ?- Begin trophic J tube feeds at 43ml/hr today, do not advance. Nutrition consult placed. Continue maintenance IV fluids. ?- Pain control: dilaudid PCA, scheduled toradol and IV robaxin ?- Diabetes: Home oral meds on hold. Continue sliding scale novolog, will likely need to add basal insulin coverage with tube feeds. Diabetes coordinator consulted. ?- Out of bed, PT consulted ?- VTE: Lovenox, SCDs ?- Dispo:  transfer to med-surg floor (6N) on telemetry ? ? LOS: 1 day  ? ? ?Michaelle Birks, MD ?[facility] Surgery ?General, Hepatobiliary and Pancreatic Surgery ?02/12/22 8:16 AM ? ?

## 2022-02-12 NOTE — Evaluation (Signed)
Physical Therapy Evaluation ?Patient Details ?Name: Zachary Bowers ?MRN: [id number] ?DOB: 23-Oct-1956 ?Today's Date: 02/12/2022 ? ?History of Present Illness ? Pt is a 66 y/o male in the ED s/p esophagojejunostomy as managment of gastric cancer. G tube was placed for feeding. PMHx: Coronary artery disease, Diabetes mellitus without complication, Hyperlipidemia, Hypertension, Sleep apnea, Malignant neoplasm of fundus of stomach, Acute blood loss anemia, appendectomy  ?Clinical Impression ? Pt was able to perform bed mobility with mod assist and cuing for splinting, log roll, and sitting up. He required min assist and cuing for sit to stand transfer. Pt was able to ambulate in room with walker min assist to stabilize his hips. Pt was sweating a lot during ambulation and reported feeling tired indicating endurance deficit. During physical exam patient showed deficits in endurance, activity tolerance. Recommending therapy services with home health physical therapy to address the previously stated deficits. Will continue to follow acutely to maximize functional mobility, independence and safety. ? ?   ? ?Recommendations for follow up therapy are one component of a multi-disciplinary discharge planning process, led by the attending physician.  Recommendations may be updated based on patient status, additional functional criteria and insurance authorization. ? ?Follow Up Recommendations Home health PT ? ?  ?Assistance Recommended at Discharge Set up Supervision/Assistance  ?Patient can return home with the following ? A lot of help with walking and/or transfers;A lot of help with bathing/dressing/bathroom;Help with stairs or ramp for entrance;Assist for transportation ? ?  ?Equipment Recommendations Rolling walker (2 wheels)  ?Recommendations for Other Services ?    ?  ?Functional Status Assessment Patient has had a recent decline in their functional status and demonstrates the ability to make significant improvements in  function in a reasonable and predictable amount of time.  ? ?  ?Precautions / Restrictions Precautions ?Precaution Comments: Must have binder on to sit/stand up. Spine percautions for comfort. ?Restrictions ?Weight Bearing Restrictions: No  ? ?  ? ?Mobility ? Bed Mobility ?Overal bed mobility: Needs Assistance ?Bed Mobility: Rolling, Sidelying to Sit ?Rolling: Min assist ?Sidelying to sit: Mod assist ?  ?  ?  ?General bed mobility comments: Pt was educated on splinting with the pillow to roll onto side before sitting EOB. Mod assist was provided to sit for physical assistance and pt comfort ?  ? ?Transfers ?Overall transfer level: Needs assistance ?Equipment used: Rolling walker (2 wheels) ?Transfers: Sit to/from Stand ?Sit to Stand: Min assist ?  ?  ?  ?  ?  ?  ?  ? ?Ambulation/Gait ?Ambulation/Gait assistance: Min assist ?Gait Distance (Feet): 7 Feet ?Assistive device: Rolling walker (2 wheels) ?Gait Pattern/deviations: Step-through pattern, Decreased stride length ?Gait velocity: decreased ?  ?  ?General Gait Details: Pt utilizes walker for UE support, Min assist was provided to stablize hips. Pt was sweating and reported feeling fatigued from walking. ? ?Stairs ?  ?  ?  ?  ?  ? ?Wheelchair Mobility ?  ? ?Modified Rankin (Stroke Patients Only) ?  ? ?  ? ?Balance Overall balance assessment: Needs assistance ?Sitting-balance support: Bilateral upper extremity supported, Feet unsupported ?Sitting balance-Leahy Scale: Fair ?Sitting balance - Comments: Pt was supervision for sitting balance to monitor symptoms and manage lines. ?  ?Standing balance support: Bilateral upper extremity supported, During functional activity, Reliant on assistive device for balance ?Standing balance-Leahy Scale: Poor ?Standing balance comment: Pt was min assist to min guard for standing balance. ?  ?  ?  ?  ?  ?  ?High level  balance activites: Backward walking, Turns ?High Level Balance Comments: Pt was able to walk backwards and turn to  sit in the recliner with min assist and cuing. ?  ?  ?  ?   ? ? ? ?Pertinent Vitals/Pain Pain Assessment ?Pain Assessment: Faces ?Faces Pain Scale: Hurts little more ?Pain Location: abdomen ?Pain Descriptors / Indicators: Grimacing, Discomfort, Guarding ?Pain Intervention(s): Monitored during session  ? ? ?Home Living Family/patient expects to be discharged to:: Private residence ?Living Arrangements: Spouse/significant other ?Available Help at Discharge: Family;Friend(s);Available 24 hours/day ?Type of Home: House ?Home Access: Stairs to enter ?  ?Entrance Stairs-Number of Steps: 5 ?  ?Home Layout: One level ?Home Equipment: None ?   ?  ?Prior Function Prior Level of Function : Independent/Modified Independent ?  ?  ?  ?  ?  ?  ?Mobility Comments: Pt enjoy's riding tractors ?  ?  ? ? ?Hand Dominance  ?   ? ?  ?Extremity/Trunk Assessment  ? Upper Extremity Assessment ?Upper Extremity Assessment: Overall WFL for tasks assessed ?  ? ?Lower Extremity Assessment ?Lower Extremity Assessment: Overall WFL for tasks assessed ?  ? ?Cervical / Trunk Assessment ?Cervical / Trunk Assessment: Kyphotic  ?Communication  ? Communication: No difficulties  ?Cognition Arousal/Alertness: Awake/alert ?Behavior During Therapy: [facility] for tasks assessed/performed ?Overall Cognitive Status: Within Functional Limits for tasks assessed ?  ?  ?  ?  ?  ?  ?  ?  ?  ?  ?  ?  ?  ?  ?  ?  ?  ?  ?  ? ?  ?General Comments General comments (skin integrity, edema, etc.): Vitals were monitored throughout the session and were stable throughout. ? ?  ?Exercises    ? ?Assessment/Plan  ?  ?PT Assessment Patient needs continued PT services  ?PT Problem List Decreased range of motion;Decreased activity tolerance;Decreased balance;Decreased knowledge of use of DME;Decreased mobility ? ?   ?  ?PT Treatment Interventions     ? ?PT Goals (Current goals can be found in the Care Plan section)  ?Acute Rehab PT Goals ?Patient Stated Goal: Maintain independance. ?PT  Goal Formulation: With patient ?Time For Goal Achievement: 02/26/22 ?Potential to Achieve Goals: Fair ? ?  ?Frequency Min 3X/week ?  ? ? ?Co-evaluation   ?  ?  ?  ?  ? ? ?  ?AM-PAC PT "6 Clicks" Mobility  ?Outcome Measure Help needed turning from your back to your side while in a flat bed without using bedrails?: A Lot ?Help needed moving from lying on your back to sitting on the side of a flat bed without using bedrails?: A Lot ?Help needed moving to and from a bed to a chair (including a wheelchair)?: A Lot ?Help needed standing up from a chair using your arms (e.g., wheelchair or bedside chair)?: A Lot ?Help needed to walk in hospital room?: A Lot ?Help needed climbing 3-5 steps with a railing? : Total ?6 Click Score: 11 ? ?  ?End of Session Equipment Utilized During Treatment: Other (comment) (abdominal binder) ?Activity Tolerance: Patient tolerated treatment well;Patient limited by fatigue ?Patient left: in chair;with chair alarm set;with call bell/phone within reach ?Nurse Communication: Mobility status ?PT Visit Diagnosis: Other abnormalities of gait and mobility (R26.89);Pain ?  ? ?Time: 6440-3474 ?PT Time Calculation (min) (ACUTE ONLY): 44 min ? ? ?Charges:   PT Evaluation ?$PT Eval Moderate Complexity: 1 Mod ?PT Treatments ?$Therapeutic Activity: 8-22 mins ?  ?   ? ? ?Quenton Fetter, SPT ? ? ?  Quenton Fetter ?02/12/2022, 2:48 PM ? ?

## 2022-02-12 NOTE — Progress Notes (Addendum)
Inpatient Diabetes Program Recommendations ? ?AACE/ADA: New Consensus Statement on Inpatient Glycemic Control (2015) ? ?Target Ranges:  Prepandial:   less than 140 mg/dL ?     Peak postprandial:   less than 180 mg/dL (1-2 hours) ?     Critically ill patients:  140 - 180 mg/dL  ? ?Lab Results  ?Component Value Date  ? GLUCAP 146 (H) 02/12/2022  ? HGBA1C 6.6 (H) 02/10/2022  ? ? ?Review of Glycemic Control ? Latest Reference Range & Units 02/11/22 15:34 02/11/22 19:32 02/11/22 23:29 02/12/22 03:09 02/12/22 07:50 02/12/22 12:12 02/12/22 15:29  ?Glucose-Capillary 70 - 99 mg/dL 248 (H) 266 (H) 244 (H) 195 (H) 147 (H) 134 (H) 146 (H)  ? ?Diabetes history: DM 2 ?Outpatient Diabetes medications: ?Amaryl 4 mg q AM, Metformin 500 mg bid ?Current orders for Inpatient glycemic control:  ?Novolog moderate q 4 hours ?Vital 10 ml/hr ? ?Inpatient Diabetes Program Recommendations:   ? ?Agree with current orders.  If blood sugars increase may need the addition of basal insulin, Semglee 10 units daily.  If patient will need insulin at d/c, will need teaching at discharge.  Will follow.  ? ?Thanks,  ?Adah Perl, RN, BC-ADM ?Inpatient Diabetes Coordinator ?Pager [phone]  (8a-5p) ? ? ?

## 2022-02-12 NOTE — Progress Notes (Signed)
?   ?  [address] ?      [name] [address] ?            [phone]      ? ?No events ?SS drain output ?Esophagram next week ? ?Zachary Bowers ? ?

## 2022-02-13 LAB — BASIC METABOLIC PANEL
Anion gap: 11 (ref 5–15)
BUN: 11 mg/dL (ref 8–23)
CO2: 23 mmol/L (ref 22–32)
Calcium: 8.9 mg/dL (ref 8.9–10.3)
Chloride: 104 mmol/L (ref 98–111)
Creatinine, Ser: 0.9 mg/dL (ref 0.61–1.24)
GFR, Estimated: >60 mL/min (ref >60)
Glucose, Bld: 126 mg/dL — ABNORMAL HIGH (ref 70–99)
Potassium: 3.7 mmol/L (ref 3.5–5.1)
Sodium: 138 mmol/L (ref 135–145)

## 2022-02-13 LAB — CBC
HCT: 30.8 % — ABNORMAL LOW (ref 39.0–52.0)
Hemoglobin: 9.8 g/dL — ABNORMAL LOW (ref 13.0–17.0)
MCH: 26.3 pg (ref 26.0–34.0)
MCHC: 31.8 g/dL (ref 30.0–36.0)
MCV: 82.6 fL (ref 80.0–100.0)
Platelets: 158 10*3/uL (ref 150–400)
RBC: 3.73 MIL/uL — ABNORMAL LOW (ref 4.22–5.81)
RDW: 17.2 % — ABNORMAL HIGH (ref 11.5–15.5)
WBC: 7.2 10*3/uL (ref 4.0–10.5)
nRBC: 0 % (ref 0.0–0.2)

## 2022-02-13 LAB — GLUCOSE, CAPILLARY
Glucose-Capillary: 110 mg/dL — ABNORMAL HIGH (ref 70–99)
Glucose-Capillary: 129 mg/dL — ABNORMAL HIGH (ref 70–99)
Glucose-Capillary: 135 mg/dL — ABNORMAL HIGH (ref 70–99)
Glucose-Capillary: 141 mg/dL — ABNORMAL HIGH (ref 70–99)
Glucose-Capillary: 145 mg/dL — ABNORMAL HIGH (ref 70–99)
Glucose-Capillary: 147 mg/dL — ABNORMAL HIGH (ref 70–99)
Glucose-Capillary: 153 mg/dL — ABNORMAL HIGH (ref 70–99)

## 2022-02-13 LAB — MAGNESIUM: Magnesium: 1.8 mg/dL (ref 1.7–2.4)

## 2022-02-13 MED ORDER — VITAL 1.5 CAL PO LIQD
1000.0000 mL | ORAL | Status: DC
  Administered 2022-02-13 – 2022-02-14 (×2): 1000 mL
  Filled 2022-02-13 (×2): qty 1000

## 2022-02-13 NOTE — Progress Notes (Signed)
Patient ID: Zachary Bowers, male   DOB: 26-Oct-1956, 66 y.o.   MRN: [id number] ?2 Days Post-Op  ?  ?Subjective: ?Using PCA, no gas, no BM ?ROS negative except as listed above. ?Objective: ?Vital signs in last 24 hours: ?Temp:  [98.5 ?F (36.9 ?C)-99.5 ?F (37.5 ?C)] 98.8 ?F (37.1 ?C) (03/04 0700) ?Pulse Rate:  [77-91] 82 (03/04 0700) ?Resp:  [13-26] 18 (03/04 2518) ?BP: (117-148)/(73-84) 140/79 (03/04 0700) ?SpO2:  [95 %-98 %] 97 % (03/04 9842) ?Arterial Line BP: (138)/(62) 138/62 (03/03 0834) ?FiO2 (%):  [0 %-39 %] 32 % (03/04 0412) ?Weight:  [100.7 kg] 100.7 kg (03/04 0500) ?Last BM Date : 02/10/22 ? ?Intake/Output from previous day: ?03/03 0701 - 03/04 0700 ?In: 3576.9 [I.V.:3265.3; NG/GT:161.7; IV Piggyback:150] ?Out: 1305 [JIZXY:8118; Emesis/NG output:30; Drains:100] ?Intake/Output this shift: ?Total I/O ?In: -  ?Out: 350 [Urine:350] ? ?General appearance: alert and cooperative ?Nose: NGT ?Resp: clear to auscultation bilaterally ?Cardio: regular rate and rhythm ?GI: soft, incision good, JP SS, J tube in place ?Extremities: calves soft ? ?Lab Results: ?CBC  ?Recent Labs  ?  02/12/22 ?8677 02/13/22 ?0226  ?WBC 8.6 7.2  ?HGB 10.3* 9.8*  ?HCT 32.1* 30.8*  ?PLT 191 158  ? ?BMET ?Recent Labs  ?  02/12/22 ?3736 02/13/22 ?0226  ?NA 134* 138  ?K 3.9 3.7  ?CL 104 104  ?CO2 23 23  ?GLUCOSE 163* 126*  ?BUN 14 11  ?CREATININE 0.99 0.90  ?CALCIUM 8.7* 8.9  ? ?PT/INR ?Recent Labs  ?  02/10/22 ?0851  ?LABPROT 13.1  ?INR 1.0  ? ?ABG ?Recent Labs  ?  02/10/22 ?0905  ?PHART 7.42  ?HCO3 25.3  ? ? ?Studies/Results: ?No results found. ? ?Anti-infectives: ?Anti-infectives (From admission, onward)  ? ? Start     Dose/Rate Route Frequency Ordered Stop  ? 02/11/22 0600  cefTRIAXone (ROCEPHIN) 2 g in sodium chloride 0.9 % 100 mL IVPB       ?See Hyperspace for full Linked Orders Report.  ? 2 g ?200 mL/hr over 30 Minutes Intravenous On call to O.R. 02/11/22 0547 02/11/22 0802  ? 02/11/22 0600  metroNIDAZOLE (FLAGYL) IVPB 500 mg       ?See  Hyperspace for full Linked Orders Report.  ? 500 mg ?100 mL/hr over 60 Minutes Intravenous On call to O.R. 02/11/22 0547 02/11/22 0815  ? ?  ? ? ?Assessment/Plan: ?66 yo male with gastric adenocarcinoma, POD2 s/p total gastrectomy with roux-en-Y esophagojejunostomy and feeding J tube placement. ?- Remain strict NPO (except small amounts of ice chips) with NG tube to suction until POD5. NG is not to be manipulated or replaced. ?- No oral meds, no crushed meds via J tube. Will give home aspirin as a rectal suppository.  ?- continue J tube feeds at 10/h today, AROBF ?- Pain control: dilaudid PCA, scheduled toradol and IV robaxin ?- Diabetes: Home oral meds on hold. Continue sliding scale novolog, Diabetes coordinator consulted. ?- Out of bed, PT  ?- VTE: Lovenox, SCDs ?- Dispo: AROBF ? LOS: 2 days  ? ? ?Georganna Skeans, MD, MPH, FACS ?Trauma & General Surgery ?Use [email or web address] to contact on call provider ? ?02/13/2022  ?

## 2022-02-13 NOTE — Progress Notes (Signed)
Physical Therapy Treatment ?Patient Details ?Name: Zachary Bowers ?MRN: [id number] ?DOB: 07-07-1956 ?Today's Date: 02/13/2022 ? ? ?History of Present Illness Pt is a 66 y/o male in the ED s/p esophagojejunostomy as managment of gastric cancer. G tube was placed for feeding. PMHx: Coronary artery disease, Diabetes mellitus without complication, Hyperlipidemia, Hypertension, Sleep apnea, Malignant neoplasm of fundus of stomach, Acute blood loss anemia, appendectomy ? ?  ?PT Comments  ? ? Pt required mod assist bed mobility, min assist sit to stand, and min assist amb 3' with RW. NG tube remained on wall suction. VSS on 3L via [place]. Pt in recliner at end of session. ?   ?Recommendations for follow up therapy are one component of a multi-disciplinary discharge planning process, led by the attending physician.  Recommendations may be updated based on patient status, additional functional criteria and insurance authorization. ? ?Follow Up Recommendations ? Home health PT ?  ?  ?Assistance Recommended at Discharge Set up Supervision/Assistance  ?Patient can return home with the following A lot of help with walking and/or transfers;A lot of help with bathing/dressing/bathroom;Help with stairs or ramp for entrance;Assist for transportation ?  ?Equipment Recommendations ? Rolling walker (2 wheels)  ?  ?Recommendations for Other Services   ? ? ?  ?Precautions / Restrictions Precautions ?Precautions: Other (comment) ?Precaution Comments: Must have binder on to sit/stand up. Spine percautions for comfort, NG tube, 1 drain, PEG  ?  ? ?Mobility ? Bed Mobility ?Overal bed mobility: Needs Assistance ?Bed Mobility: Rolling, Sidelying to Sit ?Rolling: Min assist ?Sidelying to sit: Mod assist ?  ?  ?  ?General bed mobility comments: +rail, increased time, cues for sequencing, assist with BLE and trunk ?  ? ?Transfers ?Overall transfer level: Needs assistance ?Equipment used: Rolling walker (2 wheels) ?Transfers: Sit to/from Stand ?Sit to  Stand: Min assist ?  ?  ?  ?  ?  ?General transfer comment: increased time, assist to power up ?  ? ?Ambulation/Gait ?Ambulation/Gait assistance: Min assist ?Gait Distance (Feet): 3 Feet ?Assistive device: Rolling walker (2 wheels) ?Gait Pattern/deviations: Step-through pattern, Decreased stride length, Trunk flexed ?Gait velocity: decreased ?  ?  ?General Gait Details: Short ambulation distance bed to recliner. NG maintained on suction. ? ? ?Stairs ?  ?  ?  ?  ?  ? ? ?Wheelchair Mobility ?  ? ?Modified Rankin (Stroke Patients Only) ?  ? ? ?  ?Balance Overall balance assessment: Needs assistance ?Sitting-balance support: Feet supported, No upper extremity supported ?Sitting balance-Leahy Scale: Good ?  ?  ?Standing balance support: Bilateral upper extremity supported, During functional activity, Reliant on assistive device for balance ?Standing balance-Leahy Scale: Poor ?  ?  ?  ?  ?  ?  ?  ?  ?  ?  ?  ?  ?  ? ?  ?Cognition Arousal/Alertness: Awake/alert ?Behavior During Therapy: [facility] for tasks assessed/performed ?Overall Cognitive Status: Within Functional Limits for tasks assessed ?  ?  ?  ?  ?  ?  ?  ?  ?  ?  ?  ?  ?  ?  ?  ?  ?  ?  ?  ? ?  ?Exercises   ? ?  ?General Comments General comments (skin integrity, edema, etc.): VSS on 3L via [place] ?  ?  ? ?Pertinent Vitals/Pain Pain Assessment ?Pain Assessment: Faces ?Faces Pain Scale: Hurts little more ?Pain Location: abdomen ?Pain Descriptors / Indicators: Grimacing, Discomfort, Guarding ?Pain Intervention(s): Monitored during session, Limited activity within patient's  tolerance, Repositioned, PCA encouraged  ? ? ?Home Living   ?  ?  ?  ?  ?  ?  ?  ?  ?  ?   ?  ?Prior Function    ?  ?  ?   ? ?PT Goals (current goals can now be found in the care plan section) Acute Rehab PT Goals ?Patient Stated Goal: Maintain independance. ?Progress towards PT goals: Progressing toward goals ? ?  ?Frequency ? ? ? Min 3X/week ? ? ? ?  ?PT Plan Current plan remains appropriate   ? ? ?Co-evaluation   ?  ?  ?  ?  ? ?  ?AM-PAC PT "6 Clicks" Mobility   ?Outcome Measure ? Help needed turning from your back to your side while in a flat bed without using bedrails?: A Lot ?Help needed moving from lying on your back to sitting on the side of a flat bed without using bedrails?: A Lot ?Help needed moving to and from a bed to a chair (including a wheelchair)?: A Little ?Help needed standing up from a chair using your arms (e.g., wheelchair or bedside chair)?: A Little ?Help needed to walk in hospital room?: A Lot ?Help needed climbing 3-5 steps with a railing? : Total ?6 Click Score: 13 ? ?  ?End of Session Equipment Utilized During Treatment: Oxygen;Gait belt;Other (comment) (abdominal binder) ?Activity Tolerance: Patient tolerated treatment well ?Patient left: in chair;with call bell/phone within reach;with family/visitor present ?Nurse Communication: Mobility status ?PT Visit Diagnosis: Other abnormalities of gait and mobility (R26.89);Pain ?  ? ? ?Time: 1325-1350 ?PT Time Calculation (min) (ACUTE ONLY): 25 min ? ?Charges:  $Gait Training: 8-22 mins ?$Therapeutic Activity: 8-22 mins          ?          ? ?Lorrin Goodell, PT  ?Office # [phone] ?Pager [phone] ? ? ? ?Lorriane Shire ?02/13/2022, 2:14 PM ? ?

## 2022-02-14 LAB — CBC
HCT: 29.7 % — ABNORMAL LOW (ref 39.0–52.0)
Hemoglobin: 9.8 g/dL — ABNORMAL LOW (ref 13.0–17.0)
MCH: 27.1 pg (ref 26.0–34.0)
MCHC: 33 g/dL (ref 30.0–36.0)
MCV: 82 fL (ref 80.0–100.0)
Platelets: 155 10*3/uL (ref 150–400)
RBC: 3.62 MIL/uL — ABNORMAL LOW (ref 4.22–5.81)
RDW: 16.5 % — ABNORMAL HIGH (ref 11.5–15.5)
WBC: 5.7 10*3/uL (ref 4.0–10.5)
nRBC: 0 % (ref 0.0–0.2)

## 2022-02-14 LAB — BASIC METABOLIC PANEL
Anion gap: 9 (ref 5–15)
BUN: 11 mg/dL (ref 8–23)
CO2: 24 mmol/L (ref 22–32)
Calcium: 8.8 mg/dL — ABNORMAL LOW (ref 8.9–10.3)
Chloride: 103 mmol/L (ref 98–111)
Creatinine, Ser: 0.85 mg/dL (ref 0.61–1.24)
GFR, Estimated: >60 mL/min (ref >60)
Glucose, Bld: 120 mg/dL — ABNORMAL HIGH (ref 70–99)
Potassium: 3.6 mmol/L (ref 3.5–5.1)
Sodium: 136 mmol/L (ref 135–145)

## 2022-02-14 LAB — GLUCOSE, CAPILLARY
Glucose-Capillary: 113 mg/dL — ABNORMAL HIGH (ref 70–99)
Glucose-Capillary: 152 mg/dL — ABNORMAL HIGH (ref 70–99)
Glucose-Capillary: 143 mg/dL — ABNORMAL HIGH (ref 70–99)
Glucose-Capillary: 166 mg/dL — ABNORMAL HIGH (ref 70–99)
Glucose-Capillary: 136 mg/dL — ABNORMAL HIGH (ref 70–99)
Glucose-Capillary: 163 mg/dL — ABNORMAL HIGH (ref 70–99)

## 2022-02-14 LAB — MAGNESIUM: Magnesium: 2 mg/dL (ref 1.7–2.4)

## 2022-02-14 MED ORDER — OXYCODONE HCL 5 MG/5ML PO SOLN: 10.0000 mg | ORAL | Status: DC | PRN

## 2022-02-14 MED ORDER — VITAL 1.5 CAL PO LIQD
1000.0000 mL | ORAL | Status: DC
  Administered 2022-02-14: 1000 mL
  Filled 2022-02-14 (×2): qty 1000

## 2022-02-14 NOTE — Progress Notes (Signed)
Patient ID: Zachary Bowers, male   DOB: [birth year], 66 y.o.   MRN: [id number] ?3 Days Post-Op  ?  ?Subjective: ?Passed some gas, used PCA a good bit and wants to keep it until tomorrow ?ROS negative except as listed above. ?Objective: ?Vital signs in last 24 hours: ?Temp:  [98.2 ?F (36.8 ?C)-99.6 ?F (37.6 ?C)] 98.5 ?F (36.9 ?C) (03/05 4680) ?Pulse Rate:  [69-83] 69 (03/05 0726) ?Resp:  [12-24] 15 (03/05 3212) ?BP: (136-144)/(83-91) 137/84 (03/05 0726) ?SpO2:  [96 %-99 %] 98 % (03/05 0822) ?FiO2 (%):  [33 %-39 %] 39 % (03/05 0822) ?Weight:  [100.7 kg] 100.7 kg (03/05 0500) ?Last BM Date : 02/10/22 ? ?Intake/Output from previous day: ?03/04 0701 - 03/05 0700 ?In: 1995.1 [I.V.:1845.1; IV Piggyback:150] ?Out: 2335 [Urine:1600; Emesis/NG output:500; Drains:235] ?Intake/Output this shift: ?No intake/output data recorded. ? ?General appearance: cooperative ?Nose: NGT ?Resp: clear to auscultation bilaterally ?Cardio: regular rate and rhythm ?GI: soft, incision CDI, JP bloody, J tube in place, wound good ?Extremities: calves soft ? ?Lab Results: ?CBC  ?Recent Labs  ?  02/13/22 ?0226 02/14/22 ?0246  ?WBC 7.2 5.7  ?HGB 9.8* 9.8*  ?HCT 30.8* 29.7*  ?PLT 158 155  ? ?BMET ?Recent Labs  ?  02/13/22 ?0226 02/14/22 ?0246  ?NA 138 136  ?K 3.7 3.6  ?CL 104 103  ?CO2 23 24  ?GLUCOSE 126* 120*  ?BUN 11 11  ?CREATININE 0.90 0.85  ?CALCIUM 8.9 8.8*  ? ?PT/INR ?No results for input(s): LABPROT, INR in the last 72 hours. ?ABG ?No results for input(s): PHART, HCO3 in the last 72 hours. ? ?Invalid input(s): PCO2, PO2 ? ?Studies/Results: ?No results found. ? ?Anti-infectives: ?Anti-infectives (From admission, onward)  ? ? Start     Dose/Rate Route Frequency Ordered Stop  ? 02/11/22 0600  cefTRIAXone (ROCEPHIN) 2 g in sodium chloride 0.9 % 100 mL IVPB       ?See Hyperspace for full Linked Orders Report.  ? 2 g ?200 mL/hr over 30 Minutes Intravenous On call to O.R. 02/11/22 0547 02/11/22 0802  ? 02/11/22 0600  metroNIDAZOLE (FLAGYL) IVPB 500 mg        ?See Hyperspace for full Linked Orders Report.  ? 500 mg ?100 mL/hr over 60 Minutes Intravenous On call to O.R. 02/11/22 0547 02/11/22 0815  ? ?  ? ? ?Assessment/Plan: ?66 yo male with gastric adenocarcinoma, POD2 s/p total gastrectomy with roux-en-Y esophagojejunostomy and feeding J tube placement. ?- Remain strict NPO (except small amounts of ice chips) with NG tube to suction until POD5. NG is not to be manipulated or replaced. ?- No oral meds, no crushed meds via J tube. Home aspirin as a rectal suppository.  ?- Increase J tube feeds to 40/h, AROBF ?- Pain control: dilaudid PCA, scheduled toradol and IV robaxin, add oxy liquid per tube ?- Diabetes: Home oral meds on hold. Continue sliding scale novolog, Diabetes coordinator consulted. ?- Out of bed, PT  ?- VTE: Lovenox, SCDs ?- Dispo: AROBF ? LOS: 3 days  ? ? ?Georganna Skeans, MD, MPH, FACS ?Trauma & General Surgery ?Use [email or web address] to contact on call provider ? ?02/14/2022  ?

## 2022-02-15 LAB — CBC
HCT: 31.9 % — ABNORMAL LOW (ref 39.0–52.0)
Hemoglobin: 10.2 g/dL — ABNORMAL LOW (ref 13.0–17.0)
MCH: 26.2 pg (ref 26.0–34.0)
MCHC: 32 g/dL (ref 30.0–36.0)
MCV: 81.8 fL (ref 80.0–100.0)
Platelets: 170 10*3/uL (ref 150–400)
RBC: 3.9 MIL/uL — ABNORMAL LOW (ref 4.22–5.81)
RDW: 16 % — ABNORMAL HIGH (ref 11.5–15.5)
WBC: 5.7 10*3/uL (ref 4.0–10.5)
nRBC: 0 % (ref 0.0–0.2)

## 2022-02-15 LAB — GLUCOSE, CAPILLARY
Glucose-Capillary: 152 mg/dL — ABNORMAL HIGH (ref 70–99)
Glucose-Capillary: 158 mg/dL — ABNORMAL HIGH (ref 70–99)
Glucose-Capillary: 158 mg/dL — ABNORMAL HIGH (ref 70–99)
Glucose-Capillary: 179 mg/dL — ABNORMAL HIGH (ref 70–99)
Glucose-Capillary: 180 mg/dL — ABNORMAL HIGH (ref 70–99)
Glucose-Capillary: 205 mg/dL — ABNORMAL HIGH (ref 70–99)

## 2022-02-15 LAB — BASIC METABOLIC PANEL
Anion gap: 9 (ref 5–15)
BUN: 11 mg/dL (ref 8–23)
CO2: 24 mmol/L (ref 22–32)
Calcium: 8.7 mg/dL — ABNORMAL LOW (ref 8.9–10.3)
Chloride: 104 mmol/L (ref 98–111)
Creatinine, Ser: 0.86 mg/dL (ref 0.61–1.24)
GFR, Estimated: >60 mL/min (ref >60)
Glucose, Bld: 149 mg/dL — ABNORMAL HIGH (ref 70–99)
Potassium: 3.5 mmol/L (ref 3.5–5.1)
Sodium: 137 mmol/L (ref 135–145)

## 2022-02-15 LAB — SURGICAL PATHOLOGY

## 2022-02-15 MED ORDER — OXYCODONE HCL 5 MG/5ML PO SOLN: 5.0000 mg | ORAL | Status: DC | PRN

## 2022-02-15 MED ORDER — VITAL 1.5 CAL PO LIQD
1000.0000 mL | ORAL | Status: DC
  Administered 2022-02-15: 1000 mL
  Filled 2022-02-15 (×2): qty 1000

## 2022-02-15 MED ORDER — HYDROMORPHONE HCL 1 MG/ML IJ SOLN: 0.5000 mg | INTRAMUSCULAR | Status: DC | PRN

## 2022-02-15 NOTE — Care Management Important Message (Signed)
Important Message ? ?Patient Details  ?Name: Zachary Bowers ?MRN: [id number] ?Date of Birth: 1956-02-09 ? ? ?Medicare Important Message Given:  Yes ? ? ? ? ?Levada Dy  Alexander Aument-Martin ?02/15/2022, 12:42 PM ?

## 2022-02-15 NOTE — Progress Notes (Signed)
Physical Therapy Treatment ?Patient Details ?Name: Zachary Bowers ?MRN: [id number] ?DOB: [birth year] ?Today's Date: 02/15/2022 ? ? ?History of Present Illness Pt is a 66 y/o male in the ED s/p esophagojejunostomy as managment of gastric cancer. G tube was placed for feeding. PMHx: Coronary artery disease, Diabetes mellitus without complication, Hyperlipidemia, Hypertension, Sleep apnea, Malignant neoplasm of fundus of stomach, Acute blood loss anemia, appendectomy ? ?  ?PT Comments  ? ? Pt had decreased pain during today's session. He still required min to mod assistance and bracing with a pillow with bed mobility. He demonstrated improved activity tolerance with ambulation, being able to ambulated in the hallway utilizing O2. During treatment session patient showed deficits in strength, endurance, activity tolerance. Recommending therapy services with home health services to address the previously stated deficits. Will continue to follow acutely to maximize functional mobility, independence, and safety. ?   ?Recommendations for follow up therapy are one component of a multi-disciplinary discharge planning process, led by the attending physician.  Recommendations may be updated based on patient status, additional functional criteria and insurance authorization. ? ?Follow Up Recommendations ? Home health PT ?  ?  ?Assistance Recommended at Discharge Set up Supervision/Assistance  ?Patient can return home with the following A lot of help with walking and/or transfers;A lot of help with bathing/dressing/bathroom;Help with stairs or ramp for entrance;Assist for transportation ?  ?Equipment Recommendations ? Rolling walker (2 wheels)  ?  ?Recommendations for Other Services   ? ? ?  ?Precautions / Restrictions Precautions ?Precautions: Other (comment) ?Precaution Comments: Must have binder on to sit/stand up. Spine percautions for comfort, 1 drain, PEG ?Restrictions ?Weight Bearing Restrictions: No  ?  ? ?Mobility ? Bed  Mobility ?Overal bed mobility: Needs Assistance ?Bed Mobility: Rolling, Sidelying to Sit ?Rolling: Min assist ?Sidelying to sit: Mod assist ?  ?  ?  ?General bed mobility comments: Pt required hand hold assist to roll and mod assist for trunk and legs to sit EOB ?  ? ?Transfers ?Overall transfer level: Needs assistance ?Equipment used: Rolling walker (2 wheels) ?Transfers: Sit to/from Stand ?Sit to Stand: Min assist ?  ?  ?  ?  ?  ?General transfer comment: increased time, assist to power up ?  ? ?Ambulation/Gait ?Ambulation/Gait assistance: Min assist ?Gait Distance (Feet): 80 Feet ?Assistive device: Rolling walker (2 wheels) ?Gait Pattern/deviations: Step-through pattern, Decreased stride length, Trunk flexed ?Gait velocity: decreased ?  ?  ?General Gait Details: Pt occasionally required min assist for his hip sway but was min guard most of the time. Pt's SPaO2 monitor had a poor reading but read in the 60's. Pt did no exhibit any symptom's but 2L of O2 was utilized to ensure safety. ? ? ?Stairs ?  ?  ?  ?  ?  ? ? ?Wheelchair Mobility ?  ? ?Modified Rankin (Stroke Patients Only) ?  ? ? ?  ?Balance Overall balance assessment: Needs assistance ?Sitting-balance support: Feet supported, No upper extremity supported ?Sitting balance-Leahy Scale: Good ?Sitting balance - Comments: Pt was supervision for sitting balance to monitor symptoms and manage lines. ?  ?Standing balance support: Bilateral upper extremity supported, During functional activity, Reliant on assistive device for balance ?Standing balance-Leahy Scale: Fair ?Standing balance comment: Pt was min assist to min guard for balance during ambulation. ?  ?  ?  ?  ?  ?  ?High level balance activites: Side stepping, Backward walking ?High Level Balance Comments: Pt was able to take steps backward to the bed and side  step towards the head of the bed. ?  ?  ?  ?  ? ?  ?Cognition Arousal/Alertness: Awake/alert ?Behavior During Therapy: [facility] for tasks  assessed/performed ?Overall Cognitive Status: Within Functional Limits for tasks assessed ?  ?  ?  ?  ?  ?  ?  ?  ?  ?  ?  ?  ?  ?  ?  ?  ?  ?  ?  ? ?  ?Exercises   ? ?  ?General Comments   ?  ?  ? ?Pertinent Vitals/Pain Pain Assessment ?Pain Assessment: No/denies pain ?Pain Intervention(s): Monitored during session  ? ? ?Home Living   ?  ?  ?  ?  ?  ?  ?  ?  ?  ?   ?  ?Prior Function    ?  ?  ?   ? ?PT Goals (current goals can now be found in the care plan section)   ? ?  ?Frequency ? ? ? Min 3X/week ? ? ? ?  ?PT Plan Current plan remains appropriate  ? ? ?Co-evaluation   ?  ?  ?  ?  ? ?  ?AM-PAC PT "6 Clicks" Mobility   ?Outcome Measure ? Help needed turning from your back to your side while in a flat bed without using bedrails?: A Lot ?Help needed moving from lying on your back to sitting on the side of a flat bed without using bedrails?: A Lot ?Help needed moving to and from a bed to a chair (including a wheelchair)?: A Little ?Help needed standing up from a chair using your arms (e.g., wheelchair or bedside chair)?: A Little ?Help needed to walk in hospital room?: A Lot ?Help needed climbing 3-5 steps with a railing? : Total ?6 Click Score: 13 ? ?  ?End of Session Equipment Utilized During Treatment: Oxygen ?Activity Tolerance: Patient tolerated treatment well ?Patient left: with call bell/phone within reach;in bed ?  ?PT Visit Diagnosis: Other abnormalities of gait and mobility (R26.89);Pain ?  ? ? ?Time: 4235-3614 ?PT Time Calculation (min) (ACUTE ONLY): 23 min ? ?Charges:  $Gait Training: 8-22 mins ?$Therapeutic Activity: 8-22 mins          ?          ? ?Quenton Fetter, SPT ? ? ? ?Quenton Fetter ?02/15/2022, 5:27 PM ? ?

## 2022-02-15 NOTE — Progress Notes (Signed)
Inpatient Diabetes Program Recommendations ? ?AACE/ADA: New Consensus Statement on Inpatient Glycemic Control (2015) ? ?Target Ranges:  Prepandial:   less than 140 mg/dL ?     Peak postprandial:   less than 180 mg/dL (1-2 hours) ?     Critically ill patients:  140 - 180 mg/dL  ? ?Lab Results  ?Component Value Date  ? GLUCAP 152 (H) 02/15/2022  ? HGBA1C 6.6 (H) 02/10/2022  ? ? ?Review of Glycemic Control ? Latest Reference Range & Units 02/14/22 19:43 02/14/22 23:20 02/15/22 03:07 02/15/22 07:45  ?Glucose-Capillary 70 - 99 mg/dL 136 (H) 163 (H) 158 (H) 152 (H)  ?(H): Data is abnormally high ?Diabetes history: DM 2 ?Outpatient Diabetes medications: ?Amaryl 4 mg q AM, Metformin 500 mg bid ?Current orders for Inpatient glycemic control:  ?Novolog moderate q 4 hours ?Vital 60 ml/hr ? ?Inpatient Diabetes Program Recommendations:   ? ?Consider Levemir 6 units QD.  ? ?Thanks, ?Bronson Curb, MSN, RNC-OB ?Diabetes Coordinator ?[phone] (8a-5p) ? ? ? ?

## 2022-02-15 NOTE — Progress Notes (Signed)
Patient ID: Zachary Bowers, male   DOB: May 03, 1956, 66 y.o.   MRN: [id number] ?4 Days Post-Op  ?  ?Subjective: ?Passing flatus, no BM yet. Tolerating tube feeds at 40. Afebrile, vitals stable. ? ?Objective: ?Vital signs in last 24 hours: ?Temp:  [98.6 ?F (37 ?C)-99 ?F (37.2 ?C)] 98.6 ?F (37 ?C) (03/06 0310) ?Pulse Rate:  [75-82] 82 (03/06 0310) ?Resp:  [15-21] 20 (03/06 0400) ?BP: (132-163)/(75-88) 163/87 (03/06 0310) ?SpO2:  [97 %-100 %] 97 % (03/06 0400) ?FiO2 (%):  [31 %-39 %] 33 % (03/06 0400) ?Weight:  [100.7 kg] 100.7 kg (03/06 0456) ?Last BM Date : 02/10/22 ? ?Intake/Output from previous day: ?03/05 0701 - 03/06 0700 ?In: 6010 [I.V.:1500; NG/GT:10; IV Piggyback:155] ?Out: 280 [Emesis/NG output:220; Drains:60] ?Intake/Output this shift: ?No intake/output data recorded. ? ?General: resting comfortably, NAD ?Neuro: alert and oriented, no focal deficits ?HEENT: NG in place with bilious drainage ?Resp: normal work of breathing ?CV: RRR ?Abdomen: soft, nondistended, nontender to palpation. Midline incision clean and dry. RUQ JP with serosanguinous drainage. LUQ J tube with feeds running at 40 ml/hr. ?Extremities: warm and well-perfused ? ? ?Lab Results: ?CBC  ?Recent Labs  ?  02/14/22 ?0246 02/15/22 ?0300  ?WBC 5.7 5.7  ?HGB 9.8* 10.2*  ?HCT 29.7* 31.9*  ?PLT 155 170  ? ?BMET ?Recent Labs  ?  02/14/22 ?0246 02/15/22 ?0300  ?NA 136 137  ?K 3.6 3.5  ?CL 103 104  ?CO2 24 24  ?GLUCOSE 120* 149*  ?BUN 11 11  ?CREATININE 0.85 0.86  ?CALCIUM 8.8* 8.7*  ? ?PT/INR ?No results for input(s): LABPROT, INR in the last 72 hours. ?ABG ?No results for input(s): PHART, HCO3 in the last 72 hours. ? ?Invalid input(s): PCO2, PO2 ? ?Studies/Results: ?No results found. ? ? ? ? ?Assessment/Plan: ?67 yo male with gastric adenocarcinoma, POD4 s/p total gastrectomy with roux-en-Y esophagojejunostomy and feeding J tube placement. ?- Remain strict NPO (except small amounts of ice chips) with NG tube to suction until POD5 (tomorrow). NG is not  to be manipulated or replaced. Upper GI tomorrow to assess EJ anastomosis. ?- No oral meds, no crushed meds via J tube. Home aspirin as a rectal suppository.  ?- Increase J tube feeds to goal of 60 ml/hr today. Saline lock IV. ?- Pain control: scheduled toradol and IV robaxin. Discontinue PCA and transition to oxy elixir per J tube. ?- Diabetes: Home oral meds on hold. Continue sliding scale novolog, Diabetes coordinator consulted. ?- Out of bed, PT  ?- VTE: Lovenox, SCDs ?- Dispo: progressive care ? ? LOS: 4 days  ? ?Michaelle Birks, MD ?[facility] ?General, Hepatobiliary and Pancreatic Surgery ?02/15/22 7:33 AM ? ?

## 2022-02-16 ENCOUNTER — Inpatient Hospital Stay (HOSPITAL_COMMUNITY): Payer: Medicare PPO

## 2022-02-16 LAB — GLUCOSE, CAPILLARY
Glucose-Capillary: 119 mg/dL — ABNORMAL HIGH (ref 70–99)
Glucose-Capillary: 125 mg/dL — ABNORMAL HIGH (ref 70–99)
Glucose-Capillary: 146 mg/dL — ABNORMAL HIGH (ref 70–99)
Glucose-Capillary: 188 mg/dL — ABNORMAL HIGH (ref 70–99)
Glucose-Capillary: 189 mg/dL — ABNORMAL HIGH (ref 70–99)
Glucose-Capillary: 216 mg/dL — ABNORMAL HIGH (ref 70–99)

## 2022-02-16 MED ORDER — VITAL 1.5 CAL PO LIQD
1000.0000 mL | ORAL | Status: DC
  Administered 2022-02-16 – 2022-02-18 (×3): 1000 mL
  Filled 2022-02-16 (×4): qty 1000

## 2022-02-16 MED ORDER — BOOST / RESOURCE BREEZE PO LIQD CUSTOM
1.0000 | Freq: Three times a day (TID) | ORAL | Status: DC
  Administered 2022-02-16 – 2022-02-20 (×8): 1 via ORAL

## 2022-02-16 MED ORDER — PROSOURCE TF PO LIQD
45.0000 mL | Freq: Three times a day (TID) | ORAL | Status: DC
  Administered 2022-02-16: 45 mL
  Filled 2022-02-16: qty 45

## 2022-02-16 MED ORDER — IOHEXOL 300 MG/ML  SOLN
100.0000 mL | Freq: Once | INTRAMUSCULAR | Status: AC | PRN
  Administered 2022-02-16: 55 mL via ORAL

## 2022-02-16 MED ORDER — IOHEXOL 300 MG/ML  SOLN: 100.0000 mL | Freq: Once | INTRAMUSCULAR | Status: DC | PRN

## 2022-02-16 MED ORDER — PROSOURCE TF PO LIQD
45.0000 mL | Freq: Four times a day (QID) | ORAL | Status: DC
  Administered 2022-02-16 – 2022-02-18 (×8): 45 mL
  Filled 2022-02-16 (×4): qty 45

## 2022-02-16 MED ORDER — INSULIN DETEMIR 100 UNIT/ML ~~LOC~~ SOLN
6.0000 [IU] | Freq: Every day | SUBCUTANEOUS | Status: DC
  Administered 2022-02-16: 6 [IU] via SUBCUTANEOUS
  Filled 2022-02-16 (×2): qty 0.06

## 2022-02-16 MED ORDER — VITAL 1.5 CAL PO LIQD
1440.0000 mL | ORAL | Status: DC
  Filled 2022-02-16: qty 2000

## 2022-02-16 NOTE — Progress Notes (Signed)
Mobility Specialist: Progress Note ? ? 02/16/22 1710  ?Mobility  ?Activity Ambulated with assistance in hallway  ?Level of Assistance Contact guard assist, steadying assist  ?Assistive Device Front wheel walker  ?Distance Ambulated (ft) 200 ft  ?Activity Response Tolerated well  ?$Mobility charge 1 Mobility  ? ?Pt received in bed and agreeable to ambulation, no c/o throughout. Ambulated on 2 L/min [place] with sats maintaining >90% throughout. Pt back to bed after session with call bell and phone at his side. Family present in the room.  ? ?Zachary Bowers ?Mobility Specialist ?Mobility Specialist [place]: [phone] ?Mobility Specialist [place]: [phone] ? ?

## 2022-02-16 NOTE — Progress Notes (Signed)
? ? ?  5 Days Post-Op  ?Subjective: ?No acute changes. Passing flatus, no bowel movements. Tube feeds turned off last night due to confusion regarding order, patient reports he was tolerating with no issues when they were running.  ? ?Objective: ?Vital signs in last 24 hours: ?Temp:  [98.6 ?F (37 ?C)-98.9 ?F (37.2 ?C)] 98.6 ?F (37 ?C) (03/07 0342) ?Pulse Rate:  [51-88] 41 (03/06 2307) ?Resp:  [20-23] 20 (03/06 2307) ?BP: (133-151)/(74-91) 133/84 (03/06 2307) ?SpO2:  [94 %-98 %] 94 % (03/06 2307) ?Last BM Date : 02/10/22 ? ?Intake/Output from previous day: ?03/06 0701 - 03/07 0700 ?In: -  ?Out: 6606 [Urine:450; Emesis/NG output:850; Drains:120] ?Intake/Output this shift: ?No intake/output data recorded. ? ?PE: ?General: resting comfortably, NAD ?Neuro: alert and oriented, no focal deficits ?HEENT: NG in place with bile-tinged drainage ?Resp: normal work of breathing ?Abdomen: soft, nondistended, nontender to palpation. Midline incision clean and dry with no erythema or induration, RUQ JP with serosanguinous drainage. J tube in place LUQ, capped. ?Extremities: warm and well-perfused ? ? ?Lab Results:  ?Recent Labs  ?  02/14/22 ?0246 02/15/22 ?0300  ?WBC 5.7 5.7  ?HGB 9.8* 10.2*  ?HCT 29.7* 31.9*  ?PLT 155 170  ? ?BMET ?Recent Labs  ?  02/14/22 ?0246 02/15/22 ?0300  ?NA 136 137  ?K 3.6 3.5  ?CL 103 104  ?CO2 24 24  ?GLUCOSE 120* 149*  ?BUN 11 11  ?CREATININE 0.85 0.86  ?CALCIUM 8.8* 8.7*  ? ?PT/INR ?No results for input(s): LABPROT, INR in the last 72 hours. ? ?CMP  ?   ?Component Value Date/Time  ? NA 137 02/15/2022 0300  ? NA 136 08/21/2021 1015  ? K 3.5 02/15/2022 0300  ? CL 104 02/15/2022 0300  ? CO2 24 02/15/2022 0300  ? GLUCOSE 149 (H) 02/15/2022 0300  ? BUN 11 02/15/2022 0300  ? BUN 23 08/21/2021 1015  ? CREATININE 0.86 02/15/2022 0300  ? CREATININE 0.87 12/17/2021 1116  ? CALCIUM 8.7 (L) 02/15/2022 0300  ? PROT 7.0 02/10/2022 0851  ? ALBUMIN 4.0 02/10/2022 0851  ? AST 18 02/10/2022 0851  ? AST 12 (L) 12/17/2021  1116  ? ALT 19 02/10/2022 0851  ? ALT 15 12/17/2021 1116  ? ALKPHOS 94 02/10/2022 0851  ? BILITOT 0.6 02/10/2022 0851  ? BILITOT 0.5 12/17/2021 1116  ? GFRNONAA >60 02/15/2022 0300  ? GFRNONAA >60 12/17/2021 1116  ? ?Lipase  ?No results found for: LIPASE ? ? ? ? ?Studies/Results: ?No results found. ? ? ? ? ? ?Assessment/Plan ?66 yo male with gastric adenocarcinoma, POD5 s/p total gastrectomy with roux-en-Y esophagojejunostomy and feeding J tube placement. ?- Upper GI with gastrografin today. If no evidence of EJ leak, remove NG tube and advance to clear liquid diet. ?- No oral meds, no crushed meds via J tube. Will give home aspirin as a rectal suppository.  ?- Resume J tube feeds at goal, clarified with RN that feeds should run continuously at 60 ml/hr. ?- Pain control: scheduled toradol and robaxin, prn oxycodone (per J tube) and dilaudid ?- Diabetes: Home oral meds on hold. Continue sliding scale novolog, add Levemir 6 units daily today. ?- Out of bed, PT following ?- VTE: Lovenox, SCDs ?- Dispo: progressive care ? ? LOS: 5 days  ? ? ?Michaelle Birks, MD ?[facility] ?General, Hepatobiliary and Pancreatic Surgery ?02/16/22 8:31 AM ? ?

## 2022-02-16 NOTE — TOC Progression Note (Addendum)
Transition of Care (TOC) - Progression Note  ? ? ?Patient Details  ?Name: Pistol Kessenich ?MRN: [id number] ?Date of Birth: May 30, 1956 ? ?Transition of Care (TOC) CM/SW Contact  ?Angelita Ingles, RN ?Phone [phone] ? ?02/16/2022, 12:58 PM ? ?Clinical Narrative:    ?CM received message that patient will d/c home with tube feeds. Enteral feeding request has been submitted to [facility] with [place]. Per Thedore Mins the hospital will send the patient home with 3 day supply of feeds. The pump will be delivered to the room or the patients home depending on the preference. [employer] will ship the rest of the supplies to the home.  ? ?1515 CM offered patient choice for HH. Medicare.gov list provided. Patient states that he would like for his son and his daughter to look at list to help make a decision and would like for CM to return tomorrow for decision on [facility]. 1530 CM verified patient wants feeding pump delivered to the room. Will notify [place].  ? ?  ?  ? ?Expected Discharge Plan and Services ?  ?  ?  ?  ?  ?                ?  ?  ?  ?  ?  ?  ?  ?  ?  ?  ? ? ?Social Determinants of Health (SDOH) Interventions ?  ? ?Readmission Risk Interventions ?No flowsheet data found. ? ?

## 2022-02-16 NOTE — Progress Notes (Signed)
Mobility Specialist: Progress Note ? ? 02/16/22 1413  ?Mobility  ?Activity Transferred to/from [facility]  ?Level of Assistance Minimal assist, patient does 75% or more  ?Assistive Device  ?(HHA)  ?Distance Ambulated (ft) 2 ft  ?Activity Response Tolerated well  ?$Mobility charge 1 Mobility  ? ?[name]. Assisted pt with line management and getting him back in the bed. Will f/u later today for ambulation. Call bell and phone at his side.  ? ?Zachary Bowers ?Mobility Specialist ?Mobility Specialist [place]: [phone] ?Mobility Specialist [place]: [phone] ? ?

## 2022-02-16 NOTE — Progress Notes (Signed)
?   ?  [address] ?      [name] [address] ?            [phone]      ? ?Swallow reviewed ?EJ looks good ?Diet per primary ? ?Lajuana Matte ? ?

## 2022-02-16 NOTE — Progress Notes (Signed)
Nutrition Follow-up ? ?DOCUMENTATION CODES:  ? ?Non-severe (moderate) malnutrition in context of chronic illness ? ?INTERVENTION:  ? ?Continue tube feeds via J-tube: ?- Vital 1.5 @ 60 ml/hr (1440 ml/day) ?- ProSource TF 45 ml QID ? ?Tube feeding regimen provides 2320 kcal, 141 grams of protein, and 1100 ml of H2O.  ? ?- Boost Breeze po TID, each supplement provides 250 kcal and 9 grams of protein ? ?NUTRITION DIAGNOSIS:  ? ?Moderate Malnutrition related to chronic illness (GEJ cancer) as evidenced by mild fat depletion, moderate muscle depletion. ? ?New diagnosis after completion of NFPE ? ?GOAL:  ? ?Patient will meet greater than or equal to 90% of their needs ? ?Met via TF ? ?MONITOR:  ? ?Diet advancement, Labs, Weight trends, TF tolerance, I & O's, PO intake, Supplement acceptance ? ?REASON FOR ASSESSMENT:  ? ?Consult ?Assessment of nutrition requirement/status ? ?ASSESSMENT:  ? 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 The decision was made to treat as a GEJ cancer, and pt completed neoadjuvant chemoradiation (taxol/carboplatin completed 12/18/21, radiation completed 12/22/21). Pt had a restaging PET/CT done which showed a partial response of his primary tumor to treatment and no evidence of distant metastatic disease. PMH also includes CAD, DM, HLD, HTN, sleep apnea. ? ?03/02 - s/p staging laparoscopy, open total gastrectomy with roux-en-Y esophagojejunostomy, D2 lymphadenectomy, omentectomy, and J-tube placement ?03/03 - TF initiated at 10 ml/hr ?03/05 - TF increased to 40 ml/hr ?03/06 - TF increased to goal rate of 60 ml/hr ? ?Pt with NG tube to low intermittent suction and tube feeds infusing via J-tube. Pt is s/p upper GI with gastrografin today. Per Surgery note, if no evidence of EJ leak, plan is to remove NG tube and advance to clear liquid diet. Diet advanced to clear liquids this afternoon. RD to order clear liquid oral nutrition supplements. ? ?Spoke with pt  and family at bedside. Tube feeds infusing at goal rate via J-tube. Pt reports no issues with tube feeds and no abdominal pain or distention. He is eager for potential diet advancement and ability to eat some jello. Explained plan to continue tube feeds at goal rate at this time and monitor for further diet advancement and PO intake. ? ?Current TF: Vital 1.5 @ 60 ml/hr ? ?Medications reviewed and include: SSI q 4 hours, levemir 6 units daily, levothyroxine ? ?Labs reviewed. CBG's: 119-205 x 24 hours ? ?UOP: 450 ml x 12 hours ?NGT: 850 ml x 24 hours ?R abd JP drain: 120 ml x 24 hours ?I/O's: +1.5 L since admit ? ?NUTRITION - FOCUSED PHYSICAL EXAM: ? ?Flowsheet Row Most Recent Value  ?Orbital Region Mild depletion  ?Upper Arm Region Mild depletion  ?Thoracic and Lumbar Region Mild depletion  ?Buccal Region Mild depletion  ?Temple Region Mild depletion  ?Clavicle Bone Region Moderate depletion  ?Clavicle and Acromion Bone Region Moderate depletion  ?Scapular Bone Region Mild depletion  ?Dorsal Hand Mild depletion  ?Patellar Region Mild depletion  ?Anterior Thigh Region Moderate depletion  ?Posterior Calf Region Mild depletion  ?Edema (RD Assessment) Unable to assess  ?Hair Reviewed  ?Eyes Reviewed  ?Mouth Reviewed  ?Skin Reviewed  ?Nails Reviewed  ? ?  ? ? ?Diet Order:   ?Diet Order   ? ?       ?  Diet clear liquid Room service appropriate? Yes; Fluid consistency: Thin  Diet effective now       ?  ? ?  ?  ? ?  ? ? ?  EDUCATION NEEDS:  ? ?Education needs have been addressed ? ?Skin:  Skin Assessment: ?Skin Integrity Issues: ?Incisions: closed abdomen ? ?Last BM:  02/10/22 ? ?Height:  ? ?Ht Readings from Last 1 Encounters:  ?02/11/22 6' (1.829 m)  ? ? ?Weight:  ? ?Wt Readings from Last 1 Encounters:  ?02/15/22 100.7 kg  ? ? ?Ideal Body Weight:  80.9 kg ? ?BMI:  Body mass index is 30.11 kg/m?. ? ?Estimated Nutritional Needs:  ? ?Kcal:  2400-2600 ? ?Protein:  130-145 grams ? ?Fluid:  > 2 L ? ? ? ?Gustavus Bryant, MS, RD,  LDN ?Inpatient Clinical Dietitian ?Please see AMiON for contact information. ? ?

## 2022-02-17 DIAGNOSIS — E440 Moderate protein-calorie malnutrition: Secondary | ICD-10-CM | POA: Insufficient documentation

## 2022-02-17 LAB — GLUCOSE, CAPILLARY
Glucose-Capillary: 186 mg/dL — ABNORMAL HIGH (ref 70–99)
Glucose-Capillary: 195 mg/dL — ABNORMAL HIGH (ref 70–99)
Glucose-Capillary: 177 mg/dL — ABNORMAL HIGH (ref 70–99)
Glucose-Capillary: 196 mg/dL — ABNORMAL HIGH (ref 70–99)
Glucose-Capillary: 221 mg/dL — ABNORMAL HIGH (ref 70–99)

## 2022-02-17 MED ORDER — ACETAMINOPHEN 160 MG/5ML PO SOLN: 650.0000 mg | Freq: Four times a day (QID) | ORAL | Status: DC | PRN

## 2022-02-17 MED ORDER — CYANOCOBALAMIN 1000 MCG/ML IJ SOLN
1000.0000 ug | INTRAMUSCULAR | Status: DC
  Administered 2022-02-18: 09:00:00 1000 ug via INTRAMUSCULAR
  Filled 2022-02-17: qty 1

## 2022-02-17 MED ORDER — ATORVASTATIN CALCIUM 80 MG PO TABS
80.0000 mg | ORAL_TABLET | Freq: Every day | ORAL | Status: DC
  Administered 2022-02-17 – 2022-02-20 (×4): 80 mg via ORAL
  Filled 2022-02-17 (×4): qty 1

## 2022-02-17 MED ORDER — ASPIRIN 81 MG PO CHEW
81.0000 mg | CHEWABLE_TABLET | Freq: Every day | ORAL | Status: DC
  Administered 2022-02-17 – 2022-02-20 (×4): 81 mg via ORAL
  Filled 2022-02-17 (×4): qty 1

## 2022-02-17 MED ORDER — BISACODYL 10 MG RE SUPP
10.0000 mg | Freq: Once | RECTAL | Status: AC
  Administered 2022-02-17: 10 mg via RECTAL
  Filled 2022-02-17: qty 1

## 2022-02-17 MED ORDER — INSULIN DETEMIR 100 UNIT/ML ~~LOC~~ SOLN
10.0000 [IU] | Freq: Every day | SUBCUTANEOUS | Status: DC
  Administered 2022-02-17 – 2022-02-18 (×2): 10 [IU] via SUBCUTANEOUS
  Filled 2022-02-17 (×2): qty 0.1

## 2022-02-17 MED ORDER — AMLODIPINE BESYLATE 10 MG PO TABS
10.0000 mg | ORAL_TABLET | Freq: Every day | ORAL | Status: DC
  Administered 2022-02-17 – 2022-02-20 (×4): 10 mg via ORAL
  Filled 2022-02-17 (×4): qty 1

## 2022-02-17 MED ORDER — LEVOTHYROXINE SODIUM 25 MCG PO TABS
125.0000 ug | ORAL_TABLET | Freq: Every day | ORAL | Status: DC
  Administered 2022-02-17 – 2022-02-20 (×4): 125 ug via ORAL
  Filled 2022-02-17 (×4): qty 1

## 2022-02-17 NOTE — Progress Notes (Signed)
Mobility Specialist: Progress Note ? ? 02/17/22 1741  ?Mobility  ?Activity Ambulated with assistance in hallway  ?Level of Assistance Standby assist, set-up cues, supervision of patient - no hands on  ?Assistive Device Front wheel walker  ?Distance Ambulated (ft) 390 ft  ?Activity Response Tolerated well  ?$Mobility charge 1 Mobility  ? ?Pt received in bed and agreeable to ambulation. C/o abdominal pain during session he rated 2-3/10, otherwise asymptomatic. Pt to recliner after walk with call bell in reach and family present in the room.  ? ?Harrell Gave Alesandro Stueve ?Mobility Specialist ?Mobility Specialist [place]: [phone] ?Mobility Specialist [place]: [phone] ? ?

## 2022-02-17 NOTE — Progress Notes (Addendum)
? ? ?6 Days Post-Op  ?Subjective: ?Upper GI yesterday showed no evidence of leak. Tolerating some clear liquids but minimal appetite. Tube feeds at goal. Still passing flatus but no BM. ? ?Objective: ?Vital signs in last 24 hours: ?Temp:  [98.2 ?F (36.8 ?C)-99 ?F (37.2 ?C)] 99 ?F (37.2 ?C) (03/08 2683) ?Pulse Rate:  [76-82] 82 (03/08 0737) ?Resp:  [17-21] 21 (03/08 0737) ?BP: (131-141)/(78-90) 141/88 (03/08 0737) ?SpO2:  [98 %-100 %] 98 % (03/08 0737) ?Weight:  [100.7 kg] 100.7 kg (03/08 0500) ?Last BM Date : 02/10/22 ? ?Intake/Output from previous day: ?03/07 0701 - 03/08 0700 ?In: -  ?Out: 635 [Emesis/NG output:450; Drains:185] ?Intake/Output this shift: ?No intake/output data recorded. ? ?PE: ?General: resting comfortably, NAD ?Neuro: alert and oriented, no focal deficits ?Resp: normal work of breathing on nasal cannula ?Abdomen: soft, nondistended, nontender to palpation. Midline incision clean and dry with no erythema or induration, RUQ JP with serosanguinous drainage. J tube in place LUQ, feeds running at 60 ml/hr. ?Extremities: warm and well-perfused ? ? ?Lab Results:  ?Recent Labs  ?  02/15/22 ?0300  ?WBC 5.7  ?HGB 10.2*  ?HCT 31.9*  ?PLT 170  ? ?BMET ?Recent Labs  ?  02/15/22 ?0300  ?NA 137  ?K 3.5  ?CL 104  ?CO2 24  ?GLUCOSE 149*  ?BUN 11  ?CREATININE 0.86  ?CALCIUM 8.7*  ? ?PT/INR ?No results for input(s): LABPROT, INR in the last 72 hours. ? ?CMP  ?   ?Component Value Date/Time  ? NA 137 02/15/2022 0300  ? NA 136 08/21/2021 1015  ? K 3.5 02/15/2022 0300  ? CL 104 02/15/2022 0300  ? CO2 24 02/15/2022 0300  ? GLUCOSE 149 (H) 02/15/2022 0300  ? BUN 11 02/15/2022 0300  ? BUN 23 08/21/2021 1015  ? CREATININE 0.86 02/15/2022 0300  ? CREATININE 0.87 12/17/2021 1116  ? CALCIUM 8.7 (L) 02/15/2022 0300  ? PROT 7.0 02/10/2022 0851  ? ALBUMIN 4.0 02/10/2022 0851  ? AST 18 02/10/2022 0851  ? AST 12 (L) 12/17/2021 1116  ? ALT 19 02/10/2022 0851  ? ALT 15 12/17/2021 1116  ? ALKPHOS 94 02/10/2022 0851  ? BILITOT 0.6  02/10/2022 0851  ? BILITOT 0.5 12/17/2021 1116  ? GFRNONAA >60 02/15/2022 0300  ? GFRNONAA >60 12/17/2021 1116  ? ?Lipase  ?No results found for: LIPASE ? ? ? ? ?Studies/Results: ?DG UGI W SINGLE CM (SOL OR THIN BA) ? ?Result Date: 02/16/2022 ?CLINICAL DATA:  Status post total gastrectomy. Evaluate for esophageal to jejunal anastomotic leak. Five days postop. Gastric cardia primary. EXAM: WATER SOLUBLE UPPER GI SERIES TECHNIQUE: Single-column upper GI series was performed using water soluble contrast. CONTRAST:  55 cc Omnipaque 300 COMPARISON:  02/10/2022 chest radiograph.  PET 01/18/2022. FLUOROSCOPY: Fluoroscopy Time:  5 minutes and 0 seconds Radiation Exposure Index (if provided by the fluoroscopic device): 65.6 mGy Number of Acquired Spot Images: 0 FINDINGS: Preprocedure scout film demonstrates bibasilar atelectasis. Nasogastric tube terminates in the upper abdomen (below the level of the anastomosis). There is also a right-sided surgical drain. Single contrast, focused exam performed with the patient supine and various obliquities. Gastrojejunal anastomosis is patent, without contrast extravasation to suggest postoperative leak. Mildly prominent left upper quadrant jejunal loops suggest a component of postoperative adynamic ileus. No contrast within the pleural spaces or surgical drain. IMPRESSION: Status post gastrectomy and esophageal to jejunal anastomosis, without evidence of postoperative leak. Electronically Signed   By: Abigail Miyamoto M.D.   On: 02/16/2022 11:44   ? ? ? ? ? ?  Assessment/Plan ?66 yo male with gastric adenocarcinoma, POD6 s/p total gastrectomy with roux-en-Y esophagojejunostomy and feeding J tube placement. ?- Continue clear liquid diet for today ?- Resume home oral aspirin, synthroid, lipitor and norvasc. No crushed meds via J tube. ?- Dulcolax suppository today ?- Continuous J tube feeds at goal. ?- Pain control: prn oxycodone, tylenol and dilaudid ?- Diabetes: Continue sliding scale novolog,  increase Detemir to 10 units. ?- Out of bed, PT following ?- VTE: Lovenox, SCDs ?- Dispo: progressive care ? ? LOS: 6 days  ? ? ?Michaelle Birks, MD ?[facility] ?General, Hepatobiliary and Pancreatic Surgery ?02/17/22 8:16 AM ? ?

## 2022-02-17 NOTE — Progress Notes (Signed)
Inpatient Diabetes Program Recommendations ? ?AACE/ADA: New Consensus Statement on Inpatient Glycemic Control (2015) ? ?Target Ranges:  Prepandial:   less than 140 mg/dL ?     Peak postprandial:   less than 180 mg/dL (1-2 hours) ?     Critically ill patients:  140 - 180 mg/dL  ? ?Lab Results  ?Component Value Date  ? GLUCAP 177 (H) 02/17/2022  ? HGBA1C 6.6 (H) 02/10/2022  ? ? ?Review of Glycemic Control ? Latest Reference Range & Units 02/16/22 23:41 02/17/22 03:34 02/17/22 07:39 02/17/22 11:43  ?Glucose-Capillary 70 - 99 mg/dL 189 (H) 186 (H) 195 (H) 177 (H)  ?(H): Data is abnormally high ?Diabetes history: DM 2 ?Outpatient Diabetes medications: ?Amaryl 4 mg q AM, Metformin 500 mg bid ?Current orders for Inpatient glycemic control:  ?Novolog moderate q 4 hours, Levemir 10 units QD ?Vital 60 ml/hr ?  ?Inpatient Diabetes Program Recommendations:   ? ?Discussed plan of care with Dr Zenia Resides. Plan for nocturnal ~16hr feeds within 24-36 hours and attempt for patient to begin progressing diet. ? ?Spoke with patient and wife regarding outpatient diabetes management. Has never been on insulin before and was using oral antidiabetic agents prior to surgery. Anticipate possible need for insulin. ?Reviewed patient's current A1c of 6.6%. Explained what a A1c is and what it measures. Also reviewed goal A1c with patient, importance of good glucose control @ home, and blood sugar goals. Reviewed patho of DM, potential need for insulin, role of insulin, role of pancreas, signs and symptoms of hyer vs hypo glycemia, vascular changes, interventions and commorbidities.  ?Patient was not checking CBGs prior to admission. Blood glucose meter kit #[id number]. Please order at discharge. Reviewed recommended frequency and when to call MD. ?Educated patient and spouse on insulin pen use at home. Reviewed contents of insulin flexpen starter kit. Reviewed all steps if insulin pen including attachment of needle, 2-unit air shot, dialing up dose,  giving injection, removing needle, disposal of sharps, storage of unused insulin, disposal of insulin etc. Patient able to provide successful return demonstration. Also reviewed troubleshooting with insulin pen. MD to give patient Rxs for insulin pens and insulin pen needles. ?Encouraged wife and patient to begin practicing checking CBGs and self injection while inpatient.  ? ?Thanks, ?Bronson Curb, MSN, RNC-OB ?Diabetes Coordinator ?[phone] (8a-5p) ? ? ? ?

## 2022-02-17 NOTE — TOC Progression Note (Signed)
Transition of Care (TOC) - Progression Note  ? ? ?Patient Details  ?Name: Zachary Bowers ?MRN: [id number] ?Date of Birth: 1956-06-15 ? ?Transition of Care (TOC) CM/SW Contact  ?Angelita Ingles, RN ?Phone Number:[phone] ? ?02/17/2022, 12:07 PM ? ?Clinical Narrative:    ?[facility] referral has been accepted by [facility] with Alvis Lemmings. MD will need to enter [facility] PT/RN orders. [place] RN to follow for continues tube feed support at home. TOC will continue to follow,   ? ? ?  ?  ? ?Expected Discharge Plan and Services ?  ?  ?  ?  ?  ?                ?  ?  ?  ?  ?  ?  ?  ?  ?  ?  ? ? ?Social Determinants of Health (SDOH) Interventions ?  ? ?Readmission Risk Interventions ?No flowsheet data found. ? ?

## 2022-02-17 NOTE — Progress Notes (Signed)
Physical Therapy Treatment ?Patient Details ?Name: Zachary Bowers ?MRN: [id number] ?DOB: 1956/02/01 ?Today's Date: 02/17/2022 ? ? ?History of Present Illness Pt is a 66 y/o male in the ED s/p esophagojejunostomy as managment of gastric cancer. G tube was placed for feeding. PMHx: Coronary artery disease, Diabetes mellitus without complication, Hyperlipidemia, Hypertension, Sleep apnea, Malignant neoplasm of fundus of stomach, Acute blood loss anemia, appendectomy ? ?  ?PT Comments  ? ? Pt demonstrated improved activity tolerance during today's session. He was able to mobilize in the bed with supervision, without bracing support, and mild pain. He was able to ambulated in the hallway with walker with supervision. He performed stair training with min guard to min assist, stairs were effortful and breathing was labored. During treatment session patient showed deficits in strength, endurance, activity tolerance. Recommending therapy services with home health physical therapy to address the previously stated deficits. Will continue to follow acutely to maximize functional mobility, independence, and safety. ?  ?Recommendations for follow up therapy are one component of a multi-disciplinary discharge planning process, led by the attending physician.  Recommendations may be updated based on patient status, additional functional criteria and insurance authorization. ? ?Follow Up Recommendations ? Home health PT ?  ?  ?Assistance Recommended at Discharge Set up Supervision/Assistance  ?Patient can return home with the following Help with stairs or ramp for entrance;Assist for transportation;A little help with walking and/or transfers;A lot of help with bathing/dressing/bathroom;Assistance with cooking/housework ?  ?Equipment Recommendations ? Rolling walker (2 wheels)  ?  ?Recommendations for Other Services   ? ? ?  ?Precautions / Restrictions Precautions ?Precaution Comments: Must have binder on to sit/stand up.  PEG ?Restrictions ?Weight Bearing Restrictions: No  ?  ? ?Mobility ? Bed Mobility ?Overal bed mobility: Needs Assistance ?Bed Mobility: Rolling, Sidelying to Sit ?Rolling: Supervision ?Sidelying to sit: Supervision ?  ?  ?  ?General bed mobility comments: Pt used the railing on the bed to roll onto his side and assist with sitting up to EOB. ?  ? ?Transfers ?Overall transfer level: Needs assistance ?Equipment used: Rolling walker (2 wheels) ?Transfers: Sit to/from Stand ?Sit to Stand: Min assist ?  ?  ?  ?  ?  ?General transfer comment: Pt required increased time and assistance with getting his hips over his feet. ?  ? ?Ambulation/Gait ?Ambulation/Gait assistance: Supervision ?Gait Distance (Feet): 300 Feet ?Assistive device: Rolling walker (2 wheels) ?Gait Pattern/deviations: Step-through pattern, Trunk flexed ?  ?  ?  ?General Gait Details: Pt began walking with minguard and progressed to walking with supervision. He required increased time to turn. ? ? ?Stairs ?Stairs: Yes ?Stairs assistance: Min guard ?Stair Management: One rail Right, Step to pattern, Forwards, Backwards ?Number of Stairs: 2 (x2, ( step was attempted initially)) ?General stair comments: Pt required min guard for safety and line managment on the stairs. ? ? ?Wheelchair Mobility ?  ? ?Modified Rankin (Stroke Patients Only) ?  ? ? ?  ?Balance Overall balance assessment: Needs assistance ?Sitting-balance support: Feet supported, No upper extremity supported ?Sitting balance-Leahy Scale: Good ?Sitting balance - Comments: Pt was independent for sitting balance ?  ?Standing balance support: Bilateral upper extremity supported, During functional activity, Reliant on assistive device for balance ?Standing balance-Leahy Scale: Fair ?Standing balance comment: Pt was able to stand and ambulated with supervision but is extremely reliant on assistive device in standing. ?  ?  ?  ?  ?  ?  ?  ?  ?  ?  ?  ?  ? ?  ?  Cognition Arousal/Alertness:  Awake/alert ?Behavior During Therapy: [facility] for tasks assessed/performed ?Overall Cognitive Status: Within Functional Limits for tasks assessed ?  ?  ?  ?  ?  ?  ?  ?  ?  ?  ?  ?  ?  ?  ?  ?  ?  ?  ?  ? ?  ?Exercises   ? ?  ?General Comments   ?  ?  ? ?Pertinent Vitals/Pain Pain Assessment ?Pain Assessment: No/denies pain  ? ? ?Home Living   ?  ?  ?  ?  ?  ?  ?  ?  ?  ?   ?  ?Prior Function    ?  ?  ?   ? ?PT Goals (current goals can now be found in the care plan section)   ? ?  ?Frequency ? ? ? Min 3X/week ? ? ? ?  ?PT Plan Current plan remains appropriate  ? ? ?Co-evaluation   ?  ?  ?  ?  ? ?  ?AM-PAC PT "6 Clicks" Mobility   ?Outcome Measure ? Help needed turning from your back to your side while in a flat bed without using bedrails?: None ?Help needed moving from lying on your back to sitting on the side of a flat bed without using bedrails?: A Little ?Help needed moving to and from a bed to a chair (including a wheelchair)?: A Little ?Help needed standing up from a chair using your arms (e.g., wheelchair or bedside chair)?: A Little ?Help needed to walk in hospital room?: A Little ?Help needed climbing 3-5 steps with a railing? : A Little ?6 Click Score: 19 ? ?  ?End of Session   ?Activity Tolerance: Patient tolerated treatment well ?Patient left: with call bell/phone within reach;in chair;with family/visitor present ?  ?PT Visit Diagnosis: Other abnormalities of gait and mobility (R26.89);Pain ?  ? ? ?Time: 5176-1607 ?PT Time Calculation (min) (ACUTE ONLY): 21 min ? ?Charges:  $Gait Training: 8-22 mins          ?          ? ?Quenton Fetter, SPT ? ? ? ?Quenton Fetter ?02/17/2022, 1:22 PM ? ?

## 2022-02-17 NOTE — TOC Progression Note (Addendum)
Transition of Care (TOC) - Progression Note  ? ? ?Patient Details  ?Name: Zachary Bowers ?MRN: [id number] ?Date of Birth: [birth year] ? ?Transition of Care (TOC) CM/SW Contact  ?Angelita Ingles, RN ?Phone [phone] ? ?02/17/2022, 4:19 PM ? ?Clinical Narrative:    ?Order form for tube feeds has been placed on the chart. Message sent to MD requesting signature. TOC continue to follow for disposition planning.  ? ?25 Dr. Zenia Resides will be out tomorrow but will ask the surgeon covering to sign order form.  ? ?  ?  ? ?Expected Discharge Plan and Services ?  ?  ?  ?  ?  ?                ?  ?  ?  ?  ?  ?  ?  ?  ?  ?  ? ? ?Social Determinants of Health (SDOH) Interventions ?  ? ?Readmission Risk Interventions ?No flowsheet data found. ? ?

## 2022-02-18 LAB — GLUCOSE, CAPILLARY
Glucose-Capillary: 106 mg/dL — ABNORMAL HIGH (ref 70–99)
Glucose-Capillary: 140 mg/dL — ABNORMAL HIGH (ref 70–99)
Glucose-Capillary: 176 mg/dL — ABNORMAL HIGH (ref 70–99)
Glucose-Capillary: 192 mg/dL — ABNORMAL HIGH (ref 70–99)
Glucose-Capillary: 218 mg/dL — ABNORMAL HIGH (ref 70–99)
Glucose-Capillary: 229 mg/dL — ABNORMAL HIGH (ref 70–99)

## 2022-02-18 LAB — CREATININE, SERUM
Creatinine, Ser: 0.87 mg/dL (ref 0.61–1.24)
GFR, Estimated: >60 mL/min (ref >60)

## 2022-02-18 MED ORDER — INSULIN ASPART 100 UNIT/ML IJ SOLN
3.0000 [IU] | INTRAMUSCULAR | Status: DC
  Administered 2022-02-18 – 2022-02-20 (×11): 3 [IU] via SUBCUTANEOUS

## 2022-02-18 MED ORDER — VITAL 1.5 CAL PO LIQD
1280.0000 mL | ORAL | Status: DC
  Administered 2022-02-18 – 2022-02-19 (×2): 1280 mL
  Filled 2022-02-18 (×2): qty 2000

## 2022-02-18 MED ORDER — INSULIN DETEMIR 100 UNIT/ML ~~LOC~~ SOLN
12.0000 [IU] | Freq: Every day | SUBCUTANEOUS | Status: DC
  Administered 2022-02-19 – 2022-02-20 (×2): 12 [IU] via SUBCUTANEOUS
  Filled 2022-02-18 (×2): qty 0.12

## 2022-02-18 MED ORDER — PROSOURCE TF PO LIQD
45.0000 mL | Freq: Three times a day (TID) | ORAL | Status: DC
  Administered 2022-02-18 – 2022-02-20 (×6): 45 mL
  Filled 2022-02-18 (×6): qty 45

## 2022-02-18 MED ORDER — METHOCARBAMOL 500 MG PO TABS
500.0000 mg | ORAL_TABLET | Freq: Three times a day (TID) | ORAL | Status: DC | PRN
Start: 2022-02-18 — End: 2022-02-18

## 2022-02-18 NOTE — Progress Notes (Signed)
Nutrition Follow-up ? ?DOCUMENTATION CODES:  ? ?Non-severe (moderate) malnutrition in context of chronic illness ? ?INTERVENTION:  ? ?Begin cycling tube feeds over 16 hours via J-tube: ?- Vital 1.5 @ 80 ml/hr x 16 hours from 1600 to 0800 (total of 1280 ml) ?- ProSource TF 45 ml TID ? ?Cyclic/nocturnal tube feeding regimen provides 2040 kcal, 119 grams of protein, and 978 ml of H2O (meets 85% of minimum kcal needs and 92% of minimum protein needs). ? ?- Per discussion with MD on 3/08, start vitamin B-12 injection 1000 mcg q 30 days ? ?- Continue Boost Breeze po TID, each supplement provides 250 kcal and 9 grams of protein ? ?- RD to return tomorrow (02/19/22) to provide post-gastrectomy diet education and handout ? ?NUTRITION DIAGNOSIS:  ? ?Moderate Malnutrition related to chronic illness (GEJ cancer) as evidenced by mild fat depletion, moderate muscle depletion. ? ?Ongoing, being addressed via diet advancement, oral nutrition supplements, and tube feeds ? ?GOAL:  ? ?Patient will meet greater than or equal to 90% of their needs ? ?Progressing ? ?MONITOR:  ? ?Diet advancement, Labs, Weight trends, TF tolerance, I & O's, PO intake, Supplement acceptance ? ?REASON FOR ASSESSMENT:  ? ?Consult ?Assessment of nutrition requirement/status ? ?ASSESSMENT:  ? 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 The decision was made to treat as a GEJ cancer, and pt completed neoadjuvant chemoradiation (taxol/carboplatin completed 12/18/21, radiation completed 12/22/21). Pt had a restaging PET/CT done which showed a partial response of his primary tumor to treatment and no evidence of distant metastatic disease. PMH also includes CAD, DM, HLD, HTN, sleep apnea. ? ?03/02 - s/p staging laparoscopy, open total gastrectomy with roux-en-Y esophagojejunostomy, D2 lymphadenectomy, omentectomy, and J-tube placement ?03/03 - TF initiated at 10 ml/hr ?03/05 - TF increased to 40 ml/hr ?03/06 - TF increased  to goal rate of 60 ml/hr ?03/07 - NG tube removed, diet advanced to clear liquids ?03/09 - diet advanced to full liquids ? ?Discussed pt with MD on 3/08 and with PA this morning. Vitamin B-12 injection ordered given pt is s/p total gastrectomy with roux-en-Y esophagojejunostomy. RD to adjust tube feeds to nocturnal/cyclic starting this afternoon at 1600. Tube feeds will run overnight over 16 hours from 1600 to 0800. ? ?Spoke with pt, brother, and daughter at bedside. Pt aware of plan to start cycling tube feeds. Discussed full liquid diet order. Pt does not like milk or pudding but willing to try ice cream. He would like to continue to receive the Boost Breeze supplements in peach flavor. He does not want to try the Ensure Enlive, Glucerna, etc. Pt in agreement with plan for RD to return tomorrow for post total gastrectomy diet education. ? ?Admit weight: 100.7 kg ?Current weight: 99.4 kg ? ?Meal Completion: 100% x 1 clear liquid meal tray on 3/08 ? ?Medications reviewed and include: vitamin B-12 injection 1000 mcg q 30 days, Boost Breeze TID, SSI q 4 hours, novolog 3 units q 4 hours, levemir 12  units daily ? ?Labs reviewed, CBG's: 176-221 x 24 hours ? ?UOP: 700 ml x 24 hours ?R abd JP drain: 70 ml x 12 hours ? ?Diet Order:   ?Diet Order   ? ?       ?  Diet full liquid Room service appropriate? Yes; Fluid consistency: Thin  Diet effective now       ?  ? ?  ?  ? ?  ? ? ?EDUCATION NEEDS:  ? ?Education needs have  been addressed ? ?Skin:  Skin Assessment: ?Skin Integrity Issues: ?Incisions: closed abdomen ? ?Last BM:  02/17/22 ? ?Height:  ? ?Ht Readings from Last 1 Encounters:  ?02/11/22 6' (1.829 m)  ? ? ?Weight:  ? ?Wt Readings from Last 1 Encounters:  ?02/18/22 99.4 kg  ? ? ?Ideal Body Weight:  80.9 kg ? ?BMI:  Body mass index is 29.72 kg/m?. ? ?Estimated Nutritional Needs:  ? ?Kcal:  2400-2600 ? ?Protein:  130-145 grams ? ?Fluid:  > 2 L ? ? ? ?Gustavus Bryant, MS, RD, LDN ?Inpatient Clinical Dietitian ?Please see AMiON  for contact information. ? ?

## 2022-02-18 NOTE — Care Management Important Message (Signed)
Important Message ? ?Patient Details  ?Name: Zachary Bowers ?MRN: [id number] ?Date of Birth: 03/02/1956 ? ? ?Medicare Important Message Given:  Yes ? ? ? ? ?Levada Dy  Marveen Donlon-Martin ?02/18/2022, 11:28 AM ?

## 2022-02-18 NOTE — Progress Notes (Addendum)
? ? ?7 Days Post-Op  ?Subjective: ?Tolerating clears (had some broth, couple of other sips) but reports he doesn't really have an appetite. Reports intermittent belching since the operation, not worse w/ PO intake. Having flatus. BM x 3 s/p suppository yesterday. Tube feeds at goal 24h cycle.  Mobilizing and not taking pain meds.  ?Objective: ?Vital signs in last 24 hours: ?Temp:  [98.3 ?F (36.8 ?C)-99.5 ?F (37.5 ?C)] 98.4 ?F (36.9 ?C) (03/09 0744) ?Pulse Rate:  [82-100] 82 (03/09 0744) ?Resp:  [18-20] 18 (03/09 0744) ?BP: (128-137)/(79-90) 128/79 (03/09 0744) ?SpO2:  [95 %-96 %] 95 % (03/09 0744) ?Weight:  [99.4 kg] 99.4 kg (03/09 0415) ?Last BM Date : 02/17/22 ? ?Intake/Output from previous day: ?03/08 0701 - 03/09 0700 ?In: 120 [P.O.:120] ?Out: 770 [Urine:700; Drains:70] ?Intake/Output this shift: ?No intake/output data recorded. ? ?PE: ?General: resting comfortably, NAD ?Neuro: alert and oriented, no focal deficits ?Resp: normal work of breathing on nasal cannula ?Abdomen: soft, nondistended, nontender to palpation. Midline incision clean and dry with no erythema or induration, RUQ JP with serosanguinous drainage (70 cc/24h). J tube in place LUQ, feeds running at 60 ml/hr. ?Extremities: warm and well-perfused  ? ?Lab Results:  ?No results for input(s): WBC, HGB, HCT, PLT in the last 72 hours. ? ?BMET ?Recent Labs  ?  02/18/22 ?0543  ?CREATININE 0.87  ? ?PT/INR ?No results for input(s): LABPROT, INR in the last 72 hours. ? ?CMP  ?   ?Component Value Date/Time  ? NA 137 02/15/2022 0300  ? NA 136 08/21/2021 1015  ? K 3.5 02/15/2022 0300  ? CL 104 02/15/2022 0300  ? CO2 24 02/15/2022 0300  ? GLUCOSE 149 (H) 02/15/2022 0300  ? BUN 11 02/15/2022 0300  ? BUN 23 08/21/2021 1015  ? CREATININE 0.87 02/18/2022 0543  ? CREATININE 0.87 12/17/2021 1116  ? CALCIUM 8.7 (L) 02/15/2022 0300  ? PROT 7.0 02/10/2022 0851  ? ALBUMIN 4.0 02/10/2022 0851  ? AST 18 02/10/2022 0851  ? AST 12 (L) 12/17/2021 1116  ? ALT 19 02/10/2022 0851   ? ALT 15 12/17/2021 1116  ? ALKPHOS 94 02/10/2022 0851  ? BILITOT 0.6 02/10/2022 0851  ? BILITOT 0.5 12/17/2021 1116  ? GFRNONAA >60 02/18/2022 0543  ? GFRNONAA >60 12/17/2021 1116  ? ?Lipase  ?No results found for: LIPASE ? ? ? ? ?Studies/Results: ?DG UGI W SINGLE CM (SOL OR THIN BA) ? ?Result Date: 02/16/2022 ?CLINICAL DATA:  Status post total gastrectomy. Evaluate for esophageal to jejunal anastomotic leak. Five days postop. Gastric cardia primary. EXAM: WATER SOLUBLE UPPER GI SERIES TECHNIQUE: Single-column upper GI series was performed using water soluble contrast. CONTRAST:  55 cc Omnipaque 300 COMPARISON:  02/10/2022 chest radiograph.  PET 01/18/2022. FLUOROSCOPY: Fluoroscopy Time:  5 minutes and 0 seconds Radiation Exposure Index (if provided by the fluoroscopic device): 65.6 mGy Number of Acquired Spot Images: 0 FINDINGS: Preprocedure scout film demonstrates bibasilar atelectasis. Nasogastric tube terminates in the upper abdomen (below the level of the anastomosis). There is also a right-sided surgical drain. Single contrast, focused exam performed with the patient supine and various obliquities. Gastrojejunal anastomosis is patent, without contrast extravasation to suggest postoperative leak. Mildly prominent left upper quadrant jejunal loops suggest a component of postoperative adynamic ileus. No contrast within the pleural spaces or surgical drain. IMPRESSION: Status post gastrectomy and esophageal to jejunal anastomosis, without evidence of postoperative leak. Electronically Signed   By: Abigail Miyamoto M.D.   On: 02/16/2022 11:44   ? ? ?Assessment/Plan ?  66 yo male with gastric adenocarcinoma, POD7 s/p total gastrectomy with roux-en-Y esophagojejunostomy and feeding J tube placement 02/11/22 ?- Advance to full liquid diet today ?- Continuous J tube feeds at goal; spoke with RD, will start nocturnal 16 hr TF tonight.  ?- Continue home oral aspirin, synthroid, lipitor and norvasc. No crushed meds via J tube. ?-  now having BMs s/p suppository 3/8, continue PRN suppositories ?- Pain control: prn oxycodone, tylenol and dilaudid  ?- Diabetes: Continue sliding scale novolog, increase Detemir to 12 units. adding Novolog 3 units Q4H (to be stopped or held in the event tube feeds are stopped) ?- Out of bed, PT following ?- VTE: Lovenox, SCDs ?- Dispo: progressive care, HH PT/OT at discharge ? ? LOS: 7 days  ? ? ?Obie Dredge, PA-C ?[facility] ?General & Trauma Surgery  ? ?02/18/22 7:45 AM ? ?

## 2022-02-18 NOTE — Progress Notes (Signed)
Mobility Specialist: Progress Note ? ? 02/18/22 1132  ?Mobility  ?Activity Ambulated with assistance in hallway  ?Level of Assistance Modified independent, requires aide device or extra time  ?Assistive Device Front wheel walker  ?Distance Ambulated (ft) 630 ft  ?Activity Response Tolerated well  ?$Mobility charge 1 Mobility  ? ?Received pt in chair having no complaints and agreeable to mobility. Asymptomatic throughout ambulation, returned back to bed w/ call bell in reach and all needs met. Family present in the room. ? ?Zachary Bowers ?Mobility Specialist ?Mobility Specialist [place]: [phone] ?Mobility Specialist [place]: [phone] ? ?

## 2022-02-18 NOTE — Progress Notes (Addendum)
Inpatient Diabetes Program Recommendations ? ?AACE/ADA: New Consensus Statement on Inpatient Glycemic Control (2015) ? ?Target Ranges:  Prepandial:   less than 140 mg/dL ?     Peak postprandial:   less than 180 mg/dL (1-2 hours) ?     Critically ill patients:  140 - 180 mg/dL  ? ?Lab Results  ?Component Value Date  ? GLUCAP 192 (H) 02/18/2022  ? HGBA1C 6.6 (H) 02/10/2022  ? ? ?Review of Glycemic Control ?Diabetes history: DM 2 ?Outpatient Diabetes medications: ?Amaryl 4 mg q AM, Metformin 500 mg bid ?Current orders for Inpatient glycemic control:  ?Novolog moderate q 4 hours, Levemir 10 units QD ?Vital 60 ml/hr ? ?Inpatient Diabetes Program Recommendations:   ? ?If to remain on continuous tube feeds today, consider adding Novolog 3 units Q4H (to be stopped or held in the event tube feeds are stopped) and increase Levemir to 12 units qd. ? ?Thanks, ?Bronson Curb, MSN, RNC-OB ?Diabetes Coordinator ?[phone] (8a-5p) ? ? ? ?

## 2022-02-18 NOTE — Progress Notes (Signed)
Mobility Specialist: Progress Note ? ? 02/18/22 1733  ?Mobility  ?Activity Ambulated with assistance in hallway  ?Level of Assistance Modified independent, requires aide device or extra time  ?Assistive Device Front wheel walker  ?Distance Ambulated (ft) 680 ft  ?Activity Response Tolerated well  ?$Mobility charge 1 Mobility  ? ?Received pt in bed having no complaints and agreeable to mobility. Asymptomatic throughout ambulation, returned back to recliner w/ call bell in reach and all needs met. Family member present in the room.  ? ?Harrell Gave Thekla Colborn ?Mobility Specialist ?Mobility Specialist [place]: [phone] ?Mobility Specialist [place]: [phone] ? ?

## 2022-02-19 LAB — GLUCOSE, CAPILLARY
Glucose-Capillary: 109 mg/dL — ABNORMAL HIGH (ref 70–99)
Glucose-Capillary: 112 mg/dL — ABNORMAL HIGH (ref 70–99)
Glucose-Capillary: 134 mg/dL — ABNORMAL HIGH (ref 70–99)
Glucose-Capillary: 167 mg/dL — ABNORMAL HIGH (ref 70–99)
Glucose-Capillary: 192 mg/dL — ABNORMAL HIGH (ref 70–99)
Glucose-Capillary: 211 mg/dL — ABNORMAL HIGH (ref 70–99)
Glucose-Capillary: 227 mg/dL — ABNORMAL HIGH (ref 70–99)

## 2022-02-19 NOTE — Progress Notes (Signed)
Brief Nutrition Note ? ?RD followed up with pt and wife today after plan was made yesterday to provide post total gastrectomy diet education and handout today. Pt reports feeling very tired due to incisional pain keeping him up overnight. He reports tolerating his nocturnal tube feeds without issue. He denies any bloating, nausea, vomiting, or abdominal discomfort aside from incisional pain. ? ?Pt's diet was advanced today from full liquid diet to dysphagia 1 (pureed) diet. Pt states that he did not receive a breakfast meal tray this morning. RD changed pt from room service appropriate to "with assist" so that pt can have help with meal ordering. ? ?RD provided "Eating After Your Gastrectomy" handout from [facility]. Discussed importance of eating smaller, more frequent meals (6-7 meals daily) and including protein with each meal. Discussed importance of consuming solid foods and liquids separately. Provided tips on minimizing nausea, bloating, diarrhea, etc. Discouraged intake of concentrated sweets to avoid dumping syndrome. Also discouraged intake of carbonated beverages. Pt and wife express understanding of the information provided. All questions answered. ? ?RD will continue to follow pt during acute admission. ? ? ?Gustavus Bryant, MS, RD, LDN ?Inpatient Clinical Dietitian ?Please see AMiON for contact information. ? ?

## 2022-02-19 NOTE — Progress Notes (Signed)
? ? ?8 Days Post-Op  ?Subjective: ?CC: ?Doing well. Has some mild, 3/10, soreness around the base of his incision that started when he sat up in bed and was unable to adjust it back down last night. Tolerating FLD without n/v or increased pain. Had 1 boost yesterday, water, iced tea and potato soup. Doesn't like the milk based products on the FLD menu. Still having some hiccups/burping belching since the operation that is not worse with po intake. Tolerated TF's overnight. Having flatus. No BM yesterday. Last BM 3/8 after a suppository. Did not get a suppository yesterday. Mobilizing well with RW. Voiding. No other complaints. Lives at home with his wife.  ? ?Objective: ?Vital signs in last 24 hours: ?Temp:  [98.3 ?F (36.8 ?C)-99.3 ?F (37.4 ?C)] 98.3 ?F (36.8 ?C) (03/10 0744) ?Pulse Rate:  [82-93] 82 (03/10 0744) ?Resp:  [18-20] 18 (03/10 0744) ?BP: (123-139)/(74-87) 128/84 (03/10 0744) ?SpO2:  [96 %-98 %] 96 % (03/10 0744) ?Weight:  [97 kg] 97 kg (03/10 0320) ?Last BM Date : 02/17/22 ? ?Intake/Output from previous day: ?03/09 0701 - 03/10 0700 ?In: -  ?Out: 320 [Urine:200; Drains:120] ?Intake/Output this shift: ?No intake/output data recorded. ? ?PE: ?General: resting comfortably, NAD ?Neuro: alert and oriented, no focal deficits ?Resp: normal work of breathing on nasal cannula ?Abdomen: soft, nondistended, mild tenderness around incision that appears appropriate. Midline incision clean and dry with no erythema or induration, RUQ JP with serosanguinous drainage (120 cc/24h). J tube in place LUQ and currently clamped.  ?Extremities: No LE edema. Warm and well-perfused  ? ?Lab Results:  ?No results for input(s): WBC, HGB, HCT, PLT in the last 72 hours. ?BMET ?Recent Labs  ?  02/18/22 ?0543  ?CREATININE 0.87  ? ?PT/INR ?No results for input(s): LABPROT, INR in the last 72 hours. ?CMP  ?   ?Component Value Date/Time  ? NA 137 02/15/2022 0300  ? NA 136 08/21/2021 1015  ? K 3.5 02/15/2022 0300  ? CL 104 02/15/2022 0300   ? CO2 24 02/15/2022 0300  ? GLUCOSE 149 (H) 02/15/2022 0300  ? BUN 11 02/15/2022 0300  ? BUN 23 08/21/2021 1015  ? CREATININE 0.87 02/18/2022 0543  ? CREATININE 0.87 12/17/2021 1116  ? CALCIUM 8.7 (L) 02/15/2022 0300  ? PROT 7.0 02/10/2022 0851  ? ALBUMIN 4.0 02/10/2022 0851  ? AST 18 02/10/2022 0851  ? AST 12 (L) 12/17/2021 1116  ? ALT 19 02/10/2022 0851  ? ALT 15 12/17/2021 1116  ? ALKPHOS 94 02/10/2022 0851  ? BILITOT 0.6 02/10/2022 0851  ? BILITOT 0.5 12/17/2021 1116  ? GFRNONAA >60 02/18/2022 0543  ? GFRNONAA >60 12/17/2021 1116  ? ?Lipase  ?No results found for: LIPASE ? ?Studies/Results: ?No results found. ? ?Anti-infectives: ?Anti-infectives (From admission, onward)  ? ? Start     Dose/Rate Route Frequency Ordered Stop  ? 02/11/22 0600  cefTRIAXone (ROCEPHIN) 2 g in sodium chloride 0.9 % 100 mL IVPB       ?See Hyperspace for full Linked Orders Report.  ? 2 g ?200 mL/hr over 30 Minutes Intravenous On call to O.R. 02/11/22 0547 02/11/22 0802  ? 02/11/22 0600  metroNIDAZOLE (FLAGYL) IVPB 500 mg       ?See Hyperspace for full Linked Orders Report.  ? 500 mg ?100 mL/hr over 60 Minutes Intravenous On call to O.R. 02/11/22 0547 02/11/22 0815  ? ?  ? ? ? ?Assessment/Plan ?66 yo male with gastric adenocarcinoma, POD8 s/p total gastrectomy with roux-en-Y esophagojejunostomy and feeding J  tube placement 02/11/22 ?- Discussed with RD who had spoken with Dr. Zenia Resides earlier in the week. Plan after FLD was to place patient on Dys1 diet. Will adv. She is going to meet with the patient to provide diet education at d/c.  ?- Continue nocturnal 16 hr TF. Will confirm with MD if plan is to wean this further and do calorie count vs plan for TF's at d/c w/ PO as tolerated.  ?- Continue home oral aspirin, synthroid, lipitor and norvasc. No crushed meds via J tube. ?- Diabetes: Continue sliding scale novolog, Detemir 12 units and Novolog 3 units Q4H (to be stopped or held in the event tube feeds are stopped). Will follow up if any  changes recommended by DM coordinator.  ?- Out of bed, PT following and rec HH PT w/ RW at discharge. ?- I have reached out to [facility] to see if [facility] PT/RN, DME and TF's are arranged for d/c.  ? ?FEN: Dys1 diet, boost breeze, PRN suppositories  ?VTE: Lovenox, SCDs ?ID: None currently.  ?Dispo: progressive care, HH PT/OT/RN and TF's at discharge ? ? LOS: 8 days  ? ? ?Jillyn Ledger , PA-C ?[facility] ?02/19/2022, 9:31 AM ?Please see Amion for pager number during day hours 7:00am-4:30pm ? ?

## 2022-02-19 NOTE — Progress Notes (Addendum)
Inpatient Diabetes Program Recommendations ? ?AACE/ADA: New Consensus Statement on Inpatient Glycemic Control (2015) ? ?Target Ranges:  Prepandial:   less than 140 mg/dL ?     Peak postprandial:   less than 180 mg/dL (1-2 hours) ?     Critically ill patients:  140 - 180 mg/dL  ? ?Lab Results  ?Component Value Date  ? GLUCAP 112 (H) 02/19/2022  ? HGBA1C 6.6 (H) 02/10/2022  ? ? ?Review of Glycemic Control ? ?Diabetes history: type 2 ?Outpatient Diabetes medications: Amaryl 4 mg daily ?Current orders for Inpatient glycemic control: Levemir 12 units daily, Novolog MODERATE correction scale every 4 hours, Novolog 3 units every 4 hours ? ?Inpatient Diabetes Program Recommendations:   ?Spoke with patient at the bedside. Patient and wife expressed that they are comfortable with the insulin pen usage as reviewed with them on 3/8. Wife states that she will not have help at night unless it is her daughter. Patient seems to be very willing to do what he needs to do to keep blood sugars in control. ? ?Patient is starting to eat a Dys 1 diet today. Depending on how his blood sugars are with eating a diet, recommend for discharge: ?Levemir 12 units daily, Novolog MODERATE correction scale every 4 hours, and then Novolog 3 units every 4 hours only while on the tube feedings (from 4 pm-6 am) The Novolog 3 units would be held if the tube feedings were stopped. Dosages may need to be titrated.  ? ?Patient will also need prescription for home blood glucose meter kit (order #[id number]) at discharge.  ? ?Will continue to monitor blood sugars while in the hospital. ? ?Harvel Ricks RN BSN CDE ?Diabetes Coordinator ?Pager: [phone]  8am-5pm  ? ? ? ?

## 2022-02-19 NOTE — Progress Notes (Signed)
Mobility Specialist: Progress Note ? ? 02/19/22 1309  ?Mobility  ?Activity Ambulated independently in hallway  ?Level of Assistance Independent  ?Assistive Device Other (Comment) ?(Occasionally using hand rails)  ?Distance Ambulated (ft) 530 ft  ?Activity Response Tolerated well  ?$Mobility charge 1 Mobility  ? ?Pt received in bed and agreeable to ambulation. C/o minimal abdominal pain during session, otherwise asymptomatic. Pt back to bed after walk with call bell in reach and family present in the room.  ? ?Harrell Gave Shepard Keltz ?Mobility Specialist ?Mobility Specialist [place]: [phone] ?Mobility Specialist [place]: [phone] ? ?

## 2022-02-19 NOTE — Progress Notes (Signed)
Physical Therapy Treatment ?Patient Details ?Name: Zachary Bowers ?MRN: [id number] ?DOB: 11-Apr-1956 ?Today's Date: 02/19/2022 ? ? ?History of Present Illness Pt is a 66 y/o male in the ED s/p esophagojejunostomy as managment of gastric cancer. G tube was placed for feeding. PMHx: Coronary artery disease, Diabetes mellitus without complication, Hyperlipidemia, Hypertension, Sleep apnea, Malignant neoplasm of fundus of stomach, Acute blood loss anemia, appendectomy ? ?  ?PT Comments  ? ? Today pt walked without a walker and was more unsteady. He had loss of balance a few times and required min assist to recover balance. Pt did report that he did not sleep well last night which may have contributed to decreased balance. He performed 6 stairs up and down with min guard with no loss of balance. He required min assist for bed mobility. During treatment session patient showed deficits in strength, endurance, activity tolerance. Recommending therapy services with home health physical therapy to address the previously stated deficits. Will continue to follow acutely to maximize functional mobility, independence and safety. ?  ?Recommendations for follow up therapy are one component of a multi-disciplinary discharge planning process, led by the attending physician.  Recommendations may be updated based on patient status, additional functional criteria and insurance authorization. ? ?Follow Up Recommendations ? Home health PT ?  ?  ?Assistance Recommended at Discharge Set up Supervision/Assistance  ?Patient can return home with the following Help with stairs or ramp for entrance;Assist for transportation;A little help with walking and/or transfers;Assistance with cooking/housework;A little help with bathing/dressing/bathroom ?  ?Equipment Recommendations ? Rolling walker (2 wheels)  ?  ?Recommendations for Other Services   ? ? ?  ?Precautions / Restrictions Precautions ?Precaution Comments: Must have binder on to sit/stand up.  PEG ?Restrictions ?Weight Bearing Restrictions: No  ?  ? ?Mobility ? Bed Mobility ?Overal bed mobility: Needs Assistance ?Bed Mobility: Rolling, Sidelying to Sit ?Rolling: Min assist ?Sidelying to sit: Min assist ?  ?  ?  ?General bed mobility comments: Pt used the railing and min assist to sit up to EOB. Hand hold assist as well as trunk assist. ?  ? ?Transfers ?Overall transfer level: Needs assistance ?Equipment used: None ?Transfers: Sit to/from Stand ?Sit to Stand: Min assist ?  ?  ?  ?  ?  ?General transfer comment: Pt required increased time and assistance with getting his hips over his feet. Min assist was provided for anterior weightshift of hips. ?  ? ?Ambulation/Gait ?Ambulation/Gait assistance: Supervision, Min assist ?Gait Distance (Feet): 200 Feet ?Assistive device: None ?Gait Pattern/deviations: Step-through pattern, Trunk flexed ?Gait velocity: decreased ?  ?  ?General Gait Details: Pt required min guard with walking without a walker with the occasional loss of balance. He sometimes required min assist to recover and sometimes was able to self correct. ? ? ?Stairs ?  ?Stairs assistance: Min guard ?Stair Management: One rail Right, Step to pattern, Forwards ?Number of Stairs: 6 ?General stair comments: Pt required min guard for safety on stairs. No loss of balance on stairs. ? ? ?Wheelchair Mobility ?  ? ?Modified Rankin (Stroke Patients Only) ?  ? ? ?  ?Balance Overall balance assessment: Needs assistance ?Sitting-balance support: Feet supported, No upper extremity supported ?Sitting balance-Leahy Scale: Good ?Sitting balance - Comments: Pt was independent for sitting balance ?  ?Standing balance support: Bilateral upper extremity supported, During functional activity, Reliant on assistive device for balance ?Standing balance-Leahy Scale: Fair ?Standing balance comment: Pt was min guard for static standing balance. During ambulation he required min  assist to correct loss of balance. ?  ?  ?  ?  ?  ?   ?  ?  ?  ?  ?  ?  ? ?  ?Cognition Arousal/Alertness: Awake/alert ?Behavior During Therapy: [facility] for tasks assessed/performed ?Overall Cognitive Status: Within Functional Limits for tasks assessed ?  ?  ?  ?  ?  ?  ?  ?  ?  ?  ?  ?  ?  ?  ?  ?  ?  ?  ?  ? ?  ?Exercises   ? ?  ?General Comments General comments (skin integrity, edema, etc.): Pt reported that he did not well last night and felt very tired this morning. Pt was educated on practicing upright posture to work on abdominal wall mobility. ?  ?  ? ?Pertinent Vitals/Pain Pain Assessment ?Pain Assessment: Faces ?Faces Pain Scale: Hurts little more ?Pain Location: abdomen ?Pain Descriptors / Indicators: Grimacing, Discomfort, Guarding  ? ? ?Home Living   ?  ?  ?  ?  ?  ?  ?  ?  ?  ?   ?  ?Prior Function    ?  ?  ?   ? ?PT Goals (current goals can now be found in the care plan section)   ? ?  ?Frequency ? ? ? Min 3X/week ? ? ? ?  ?PT Plan Current plan remains appropriate  ? ? ?Co-evaluation   ?  ?  ?  ?  ? ?  ?AM-PAC PT "6 Clicks" Mobility   ?Outcome Measure ? Help needed turning from your back to your side while in a flat bed without using bedrails?: A Little ?Help needed moving from lying on your back to sitting on the side of a flat bed without using bedrails?: A Little ?Help needed moving to and from a bed to a chair (including a wheelchair)?: A Little ?Help needed standing up from a chair using your arms (e.g., wheelchair or bedside chair)?: A Little ?Help needed to walk in hospital room?: A Little ?Help needed climbing 3-5 steps with a railing? : A Little ?6 Click Score: 18 ? ?  ?End of Session   ?Activity Tolerance: Patient tolerated treatment well ?Patient left: with family/visitor present;in bed ?Nurse Communication: Mobility status ?PT Visit Diagnosis: Other abnormalities of gait and mobility (R26.89);Pain ?  ? ? ?Time: 0953-1010 ?PT Time Calculation (min) (ACUTE ONLY): 17 min ? ?Charges:  $Gait Training: 8-22 mins          ?          ? ?Quenton Fetter,  SPT ? ? ? ?Quenton Fetter ?02/19/2022, 4:44 PM ? ?

## 2022-02-19 NOTE — TOC Progression Note (Addendum)
Transition of Care (TOC) - Progression Note  ? ? ?Patient Details  ?Name: Zachary Bowers ?MRN: [id number] ?Date of Birth: 1956-02-17 ? ?Transition of Care (TOC) CM/SW Contact  ?Angelita Ingles, RN ?Phone Number:[phone] ? ?02/19/2022, 12:17 PM ? ?Clinical Narrative:    ?TOC following to ensure that patient is set to go home on tube feeds. CM called [name] with [employer] to confirm pump delivery. Order form needs to be faxed to [facility] with [place] and he will send info to intake for delivery. Order has been faxed. Delivery pending ? ?0600 Per [employer] feeding pump has been delivered to the room. RN is aware that patient and family will need teaching and 3 day supply of tube feedings for home. [employer] has confirmed that they have received Tube feeding orders. HH has been previously [facility] up with [facility]. No other needs noted at this time.  ? ? ?  ?  ? ?Expected Discharge Plan and Services ?  ?  ?  ?  ?  ?                ?  ?  ?  ?  ?  ?  ?  ?  ?  ?  ? ? ?Social Determinants of Health (SDOH) Interventions ?  ? ?Readmission Risk Interventions ?No flowsheet data found. ? ?

## 2022-02-20 LAB — GLUCOSE, CAPILLARY
Glucose-Capillary: 159 mg/dL — ABNORMAL HIGH (ref 70–99)
Glucose-Capillary: 157 mg/dL — ABNORMAL HIGH (ref 70–99)
Glucose-Capillary: 157 mg/dL — ABNORMAL HIGH (ref 70–99)

## 2022-02-20 MED ORDER — PROSOURCE TF PO LIQD: 45.0000 mL | Freq: Three times a day (TID) | ORAL | Status: AC

## 2022-02-20 MED ORDER — VITAL 1.5 CAL PO LIQD: 1000.0000 mL | ORAL | 0 refills | Status: AC

## 2022-02-20 MED ORDER — CYANOCOBALAMIN 1000 MCG/ML IJ SOLN
1000.0000 ug | INTRAMUSCULAR | 0 refills | Status: AC
Start: 2022-03-20 — End: ?

## 2022-02-20 MED ORDER — INSULIN DETEMIR 100 UNIT/ML FLEXPEN: 12.0000 [IU] | PEN_INJECTOR | Freq: Every day | SUBCUTANEOUS | 0 refills | Status: AC

## 2022-02-20 MED ORDER — OXYCODONE HCL 5 MG/5ML PO SOLN: 5.0000 mg | Freq: Four times a day (QID) | ORAL | 0 refills | Status: AC | PRN

## 2022-02-20 MED ORDER — INSULIN ASPART 100 UNIT/ML IJ SOLN: INTRAMUSCULAR | 0 refills | Status: DC

## 2022-02-20 MED ORDER — ACETAMINOPHEN 160 MG/5ML PO SOLN: 650.0000 mg | Freq: Four times a day (QID) | ORAL | 0 refills | Status: AC | PRN

## 2022-02-20 MED ORDER — INSULIN ASPART 100 UNIT/ML IJ SOLN: 4.0000 [IU] | INTRAMUSCULAR | Status: DC

## 2022-02-20 MED ORDER — BLOOD GLUCOSE MONITOR KIT: PACK | 0 refills | Status: AC

## 2022-02-20 MED ORDER — INSULIN ASPART 100 UNIT/ML FLEXPEN: 0.0000 [IU] | PEN_INJECTOR | SUBCUTANEOUS | 0 refills | Status: DC

## 2022-02-20 MED ORDER — VITAL 1.5 CAL PO LIQD
960.0000 mL | ORAL | Status: DC
  Filled 2022-02-20 (×2): qty 3000

## 2022-02-20 MED ORDER — INSULIN PEN NEEDLE 32G X 4 MM MISC: 0 refills | Status: AC

## 2022-02-20 NOTE — Progress Notes (Addendum)
Inpatient Diabetes Program Recommendations ? ?AACE/ADA: New Consensus Statement on Inpatient Glycemic Control  ? ?Target Ranges:  Prepandial:   less than 140 mg/dL ?     Peak postprandial:   less than 180 mg/dL (1-2 hours) ?     Critically ill patients:  140 - 180 mg/dL  ? ? Latest Reference Range & Units 02/20/22 03:27 02/20/22 07:59  ?Glucose-Capillary 70 - 99 mg/dL 159 (H) 157 (H)  ? ? Latest Reference Range & Units 02/19/22 07:42 02/19/22 11:26 02/19/22 15:59 02/19/22 20:01 02/19/22 23:41  ?Glucose-Capillary 70 - 99 mg/dL 134 (H) 112 (H) 109 (H) 192 (H) 227 (H)  ? ?Review of Glycemic Control ? ?Diabetes history: DM2 ?Outpatient Diabetes medications: Amaryl 4 mg daily, Metformin 500 mg BID ?Current orders for Inpatient glycemic control: Levemir 12 units daily, Novolog 0-15 units Q4H, Novolog 3 units Q4H; Vital @ 80 ml/hr from 4pm-8am ? ?Inpatient Diabetes Program Recommendations:   ? ?Insulin: Since patient is only receiving tube feeding from 4pm-8am, would recommend changing frequency and dose of Novolog tube feeding coverage to 4 units QID (scheduled for 16:00, 20:00, 00:00, and 4:00). ? ?Discharge recommendations: At time of discharge, would continue Levemir 12 units daily, Novolog 0-15 units Q4H, Novolog 4 units QID (scheduled for 16:00, 20:00, 00:00, and 4:00). Patient will need Rx for: glucose monitoring kit (#[id number]), Levemir Flextouch pens [phone]), Novolog Flexpen (#[id number]),QZE insulin pen needles [id number]). ? ?Addendum 02/20/22@10 :15-Spoke with Alferd Apa, PA-C regarding discharge recommendations as patient will be discharging home today. Recommended to discharge on same insulin regimen as used currently as inpatient and have patient follow up with PCP early next week.  Updated note with recommendations and order numbers for insulin, insulin pen needles, and glucose monitoring kit. ? ?Thanks, ?Barnie Alderman, RN, MSN, CDE ?Diabetes Coordinator ?Inpatient Diabetes Program ?[phone] (Team Pager from  8am to 5pm) ? ? ? ?

## 2022-02-20 NOTE — Progress Notes (Signed)
Wasted 43m Dilaudid from PCA pump with Alexa, RN.  ?

## 2022-02-20 NOTE — Progress Notes (Signed)
Mobility Specialist: Progress Note ? ? 02/20/22 1003  ?Mobility  ?Activity Ambulated independently in hallway  ?Level of Assistance Independent  ?Assistive Device None  ?Distance Ambulated (ft) 530 ft  ?Activity Response Tolerated well  ?$Mobility charge 1 Mobility  ? ?Received pt in bed having no complaints and agreeable to mobility. Asymptomatic throughout ambulation, returned back to bed w/ call bell in reach and all needs met. ? ?Zachary Bowers ?Mobility Specialist ?Mobility Specialist [place]: [phone] ?Mobility Specialist [place]: [phone] ? ?

## 2022-02-20 NOTE — Discharge Summary (Incomplete)
Patient ID: Zachary Bowers [id number] 08/09/1956 66 y.o.  Admit date: 02/11/2022 Discharge date: 02/20/2022  Admitting Diagnosis: Adenocarcinoma of the gastric cardia, s/p chemoradiation  Discharge Diagnosis Patient Active Problem List   Diagnosis Date Noted   Malnutrition of moderate degree 02/17/2022   Gastric cancer ([place]) 02/11/2022   Malignant neoplasm of cardia of stomach ([place]) 10/28/2021   Symptomatic anemia 10/08/2021   Gastric mass 10/08/2021   Chest pain 08/11/2021   Coronary artery disease due to lipid rich plaque 01/13/2015   Essential hypertension 01/13/2015   Hyperlipidemia 01/13/2015   Angina decubitus ([place]) 03/25/2014   Pure hypercholesterolemia 03/25/2014   Obesity 03/25/2014   Essential hypertension, benign 03/25/2014  66 yo male with gastric adenocarcinoma s/p total gastrectomy with roux-en-Y esophagojejunostomy and feeding J tube placement 02/11/22  Consultants TCTS  H&P Zachary Bowers is a 66 y.o. 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 After discussion at [facility], the decision was made to treat as a GEJ cancer, and he completed neoadjuvant chemoradiation (taxol/carboplatin completed 12/18/21, radiation completed 12/22/21). He had a restaging PET/CT done yesterday, which showed a partial response of his primary tumor to treatment and no evidence of distant metastatic disease. Today he says he is overall feeling well. He had some epigastric and chest pain during radiation, but this has improved. He lost about 30 pounds during treatment, but he is now eating more and his weight today is stable compared to weight 2 weeks ago. He has seen Dr. Kipp Brood and is scheduled to see him again on 2/17.   He had cardiac stents placed last August, and Plavix was stopped two months later due to bleeding. He remains on aspirin alone. His last echo showed normal function, and in October his cardiologist recommended no further  preoperative workup, however he will be at increased risk for periop cardiac complications.  Procedures Dr. Michaelle Birks - 02/11/22 Staging laparoscopy Open total gastrectomy with roux-en-Y esophagojejunostomy, D2 lymphadenectomy, and omentectomy  Dr. Melodie Bouillon - 02/11/22 Esophagojejunostomy  Hospital Course:  Patient presented for planned surgery and underwent open total gastrectomy with roux-en-Y esophagojejunostomy, D2 lymphadenectomy, and omentectomy on 3/2. He remained strict NPO (except small amounts of ice chips) with NG tube to suction until POD5 with instructions for NG not to be manipulated or replaced. Trophic J tube feeds at 26m/hr today, was started on POD1. He was transitioned off his home dm medications with plan to go home on insulin. DM coordinator and nutrition were consulted to help assist with transition. TF's were slowly increased and met goal on POD 4. UGI on POD5 neg for leak and patient was started on cld. After robf his diet was advanced to dys1 diet which he tolerated well. He worked with [place] who [place] [facility]. On 3/11 (POD 9), the patient reports he was tolerating his dys1 diet without abdominal pain, nausea or vomiting; he was passing flatus and had a bm that morning; and was mobilizing well in the halls w/ mobility specialist.   Prior to discharge, I verified with RN and CM [facility]. His TF machine was delivered to the room before d/c and his shipment for TF's was in route to his house with the anticipation of it being delivered later that day per his wife. Pharmacy was able to arrange for [facility] to send him home with a 3d supply of TF's as well. He was doing  great with his dys1 diet so his nocturnal TF's were decreased from 11m/hr to 626mhr. He is to be discharged at 6037mr nocturnal TF's from 4pm to 6am and stay on dys1 diet. I did re-discuss and confirm insulin dosing with dm coordinator before discharge and advised patient that he  will need close follow up with his PCP. Prior to discharge the patient's drain was removed. I have relayed discharge plans with Dr. AllZenia Resideso is arranging follow up in the office. I discussed plan, discharge instruction & restrictions along with call back/return precautions with the patient and his wife.   Physical Exam: General: resting comfortably, NAD Neuro: alert and oriented, no focal deficits Resp: normal work of breathing on RA Abdomen: soft, nondistended, NT. Midline incision clean and dry with no erythema or induration, RUQ JP with serosanguinous drainage. J tube in place LUQ and currently clamped.  Extremities: No LE edema. Warm and well-perfused   Allergies as of 02/20/2022       Reactions   Niaspan [niacin Er] Itching        Medication List     STOP taking these medications    acetaminophen 500 MG tablet Commonly known as: TYLENOL Replaced by: acetaminophen 160 MG/5ML solution   dexamethasone 2 MG tablet Commonly known as: DECADRON   ferrous sulfate 325 (65 FE) MG tablet   glimepiride 4 MG tablet Commonly known as: AMARYL   metFORMIN 500 MG tablet Commonly known as: GLUCOPHAGE   prochlorperazine 10 MG tablet Commonly known as: COMPAZINE       TAKE these medications    acetaminophen 160 MG/5ML solution Commonly known as: TYLENOL Take 20.3 mLs (650 mg total) by mouth every 6 (six) hours as needed for mild pain. Replaces: acetaminophen 500 MG tablet   amLODipine 10 MG tablet Commonly known as: NORVASC Take 10 mg by mouth daily.   aspirin 81 MG tablet Take 81 mg by mouth daily.   atorvastatin 80 MG tablet Commonly known as: LIPITOR Take 1 tablet (80 mg total) by mouth daily.   blood glucose meter kit and supplies Kit Dispense based on patient and insurance preference. Use up to four times daily as directed.   cyanocobalamin 1000 MCG/ML injection Commonly known as: (VITAMIN B-12) Inject 1 mL (1,000 mcg total) into the muscle every 30 (thirty)  days. Start taking on: March 20, 2022   feeding supplement (VITAL 1.5 CAL) Liqd Place 1,000 mLs into feeding tube daily for 3 days. Administer over 16 Hours NOCTURNAL tube feeds to run at 60 ml/hr for 16 hours from 4pm to 8am.   feeding supplement (PROSource TF) liquid Place 45 mLs into feeding tube 3 (three) times daily.   glucose blood test strip 1 each by Other route. Check blood sugar every morning   insulin aspart 100 UNIT/ML FlexPen Commonly known as: NOVOLOG Inject 0-15 Units into the skin every 4 (four) hours. CBG 70 - 120: 0 units. CBG 121 - 150:2 units. CBG 151 - 200:3 units. CBG 201 - 250:5 units. CBG 251 - 300:8 units. CBG 301 - 350:11 units. CBG 351 - 400:15 units. CBG > 400 - seek medical attention.   insulin aspart 100 UNIT/ML injection Commonly known as: novoLOG Administer Novolog 3 units subcutaneous QID (at 16:00, 20:00, 00:00, and 4:00) while TF's are running. Do Not start tube feeding insulin coverage until tube feeding is at goal rate. Hold tube feeding insulin coverage if tube feeding is held or interrupted for any reason.   insulin detemir 100 UNIT/ML  FlexPen Commonly known as: LEVEMIR Inject 12 Units into the skin daily.   Insulin Pen Needle 32G X 4 MM Misc To be used for insulin administration   isosorbide mononitrate 30 MG 24 hr tablet Commonly known as: IMDUR Take 1 tablet (30 mg total) by mouth daily.   levothyroxine 125 MCG tablet Commonly known as: SYNTHROID Take 125 mcg by mouth daily before breakfast.   nitroGLYCERIN 0.4 MG SL tablet Commonly known as: NITROSTAT Place 1 tablet (0.4 mg total) under the tongue every 5 (five) minutes as needed for chest pain.   oxyCODONE 5 MG/5ML solution Commonly known as: ROXICODONE 5 mLs (5 mg total) by Per J Tube route every 6 (six) hours as needed for breakthrough pain.   telmisartan 40 MG tablet Commonly known as: MICARDIS Take 40 mg by mouth daily.          Follow-up Information     [facility], [facility] Follow up.   Specialty: Home Health Services Why: Your home health has been set up. The office will call you with start of service information. If you have any questions or concerns please call the number listed above. Contact information: [address] [place] [address] [phone]         [name], MD Follow up.   Specialty: General Surgery Why: Please call to confirm your follow up appointment date and time. Please arrive 30 minutes prior to your appointment for paperwork. Please bring a copy of your photo ID and insurance card. Contact information: [place]. [address] [place] [address] [phone]         Lajuana Matte, MD. Call.   Specialty: Cardiothoracic Surgery Why: For follow up Contact information: [address] [place] [address] [phone]         Shirline Frees, MD. Schedule an appointment as soon as possible for a visit.   Specialty: Family Medicine Why: For diabetic follow up within 1 week of discharge Contact information: [place] [address] [place], Cassie Freer, MD .   Specialties: Cardiology, Internal Medicine, Radiology Contact information: [place] [address] [phone]                 Signed: Alferd Apa, [facility] 02/20/2022, 11:43 AM Please see Amion for pager number during day hours 7:00am-4:30pm

## 2022-02-20 NOTE — Progress Notes (Signed)
Patient ID: Zachary Bowers, male   DOB: October 25, 1956, 66 y.o.   MRN: [id number] ?[facility] Progress Note:   9 Days Post-Op  ?Subjective: ?Mental status is clear and appropriate.  Complaints none. ?Objective: ?Vital signs in last 24 hours: ?Temp:  [97.6 ?F (36.4 ?C)-99.9 ?F (37.7 ?C)] 99.4 ?F (37.4 ?C) (03/11 0732) ?Pulse Rate:  [82-94] 82 (03/11 0732) ?Resp:  [18-20] 19 (03/11 0732) ?BP: (116-128)/(71-87) 116/82 (03/11 0732) ?SpO2:  [94 %-97 %] 94 % (03/11 0732) ?Weight:  [95.2 kg] 95.2 kg (03/11 0324) ? ?Intake/Output from previous day: ?03/10 0701 - 03/11 0700 ?In: 0865 [P.O.:795; NG/GT:240] ?Out: 370 [Urine:200; Drains:170] ?Intake/Output this shift: ?Total I/O ?In: -  ?Out: 175 [Urine:175] ? ?Physical Exam: Work of breathing is normal-sitting up in chair.  JP is draining serosanguinous.  Taking jejunostomy feeding at 80--will reduce to 60 as he is taking a pureed diet well.   ? ?Lab Results:  ?Results for orders placed or performed during the hospital encounter of 02/11/22 (from the past 48 hour(s))  ?Glucose, capillary     Status: Abnormal  ? Collection Time: 02/18/22 11:19 AM  ?Result Value Ref Range  ? Glucose-Capillary 140 (H) 70 - 99 mg/dL  ?  Comment: Glucose reference range applies only to samples taken after fasting for at least 8 hours.  ?Glucose, capillary     Status: Abnormal  ? Collection Time: 02/18/22  3:51 PM  ?Result Value Ref Range  ? Glucose-Capillary 106 (H) 70 - 99 mg/dL  ?  Comment: Glucose reference range applies only to samples taken after fasting for at least 8 hours.  ?Glucose, capillary     Status: Abnormal  ? Collection Time: 02/18/22  7:39 PM  ?Result Value Ref Range  ? Glucose-Capillary 229 (H) 70 - 99 mg/dL  ?  Comment: Glucose reference range applies only to samples taken after fasting for at least 8 hours.  ?Glucose, capillary     Status: Abnormal  ? Collection Time: 02/19/22 12:00 AM  ?Result Value Ref Range  ? Glucose-Capillary 211 (H) 70 - 99 mg/dL  ?  Comment:  Glucose reference range applies only to samples taken after fasting for at least 8 hours.  ?Glucose, capillary     Status: Abnormal  ? Collection Time: 02/19/22  3:26 AM  ?Result Value Ref Range  ? Glucose-Capillary 167 (H) 70 - 99 mg/dL  ?  Comment: Glucose reference range applies only to samples taken after fasting for at least 8 hours.  ?Glucose, capillary     Status: Abnormal  ? Collection Time: 02/19/22  7:42 AM  ?Result Value Ref Range  ? Glucose-Capillary 134 (H) 70 - 99 mg/dL  ?  Comment: Glucose reference range applies only to samples taken after fasting for at least 8 hours.  ?Glucose, capillary     Status: Abnormal  ? Collection Time: 02/19/22 11:26 AM  ?Result Value Ref Range  ? Glucose-Capillary 112 (H) 70 - 99 mg/dL  ?  Comment: Glucose reference range applies only to samples taken after fasting for at least 8 hours.  ?Glucose, capillary     Status: Abnormal  ? Collection Time: 02/19/22  3:59 PM  ?Result Value Ref Range  ? Glucose-Capillary 109 (H) 70 - 99 mg/dL  ?  Comment: Glucose reference range applies only to samples taken after fasting for at least 8 hours.  ?Glucose, capillary     Status: Abnormal  ? Collection Time: 02/19/22  8:01 PM  ?Result Value Ref Range  ?  Glucose-Capillary 192 (H) 70 - 99 mg/dL  ?  Comment: Glucose reference range applies only to samples taken after fasting for at least 8 hours.  ?Glucose, capillary     Status: Abnormal  ? Collection Time: 02/19/22 11:41 PM  ?Result Value Ref Range  ? Glucose-Capillary 227 (H) 70 - 99 mg/dL  ?  Comment: Glucose reference range applies only to samples taken after fasting for at least 8 hours.  ?Glucose, capillary     Status: Abnormal  ? Collection Time: 02/20/22  3:27 AM  ?Result Value Ref Range  ? Glucose-Capillary 159 (H) 70 - 99 mg/dL  ?  Comment: Glucose reference range applies only to samples taken after fasting for at least 8 hours.  ?Glucose, capillary     Status: Abnormal  ? Collection Time: 02/20/22  7:59 AM  ?Result Value Ref  Range  ? Glucose-Capillary 157 (H) 70 - 99 mg/dL  ?  Comment: Glucose reference range applies only to samples taken after fasting for at least 8 hours.  ? Comment 1 Notify RN   ? Comment 2 Document in Chart   ? ? ?Radiology/Results: ?No results found. ? ?Anti-infectives: ?Anti-infectives (From admission, onward)  ? ? Start     Dose/Rate Route Frequency Ordered Stop  ? 02/11/22 0600  cefTRIAXone (ROCEPHIN) 2 g in sodium chloride 0.9 % 100 mL IVPB       ?See Hyperspace for full Linked Orders Report.  ? 2 g ?200 mL/hr over 30 Minutes Intravenous On call to O.R. 02/11/22 0547 02/11/22 0802  ? 02/11/22 0600  metroNIDAZOLE (FLAGYL) IVPB 500 mg       ?See Hyperspace for full Linked Orders Report.  ? 500 mg ?100 mL/hr over 60 Minutes Intravenous On call to O.R. 02/11/22 0547 02/11/22 0815  ? ?  ? ? ?Assessment/Plan: ?Problem List: ?Patient Active Problem List  ? Diagnosis Date Noted  ? Malnutrition of moderate degree 02/17/2022  ? Gastric cancer ([place]) 02/11/2022  ? Malignant neoplasm of cardia of stomach ([place]) 10/28/2021  ? Symptomatic anemia 10/08/2021  ? Gastric mass 10/08/2021  ? Chest pain 08/11/2021  ? Coronary artery disease due to lipid rich plaque 01/13/2015  ? Essential hypertension 01/13/2015  ? Hyperlipidemia 01/13/2015  ? Angina decubitus ([place]) 03/25/2014  ? Pure hypercholesterolemia 03/25/2014  ? Obesity 03/25/2014  ? Essential hypertension, benign 03/25/2014  ? ? ?Possible discharge this weekend if home care can be coordinated. ?9 Days Post-Op  ? ? LOS: 9 days  ? ?Matt B. Hassell Done, MD, FACS ? ?[facility] ?[phone] to reach the surgeon on call.   ? ?02/20/2022 9:05 AM  ?

## 2022-02-21 LAB — BPAM RBC
Blood Product Expiration Date: 202303242359
Blood Product Expiration Date: 202303252359
Unit Type and Rh: 8400
Unit Type and Rh: 8400

## 2022-02-21 LAB — TYPE AND SCREEN
ABO/RH(D): AB POS
Antibody Screen: NEGATIVE
Unit division: 0
Unit division: 0

## 2022-02-25 DIAGNOSIS — E1142 Type 2 diabetes mellitus with diabetic polyneuropathy: Secondary | ICD-10-CM | POA: Diagnosis not present

## 2022-02-25 DIAGNOSIS — I10 Essential (primary) hypertension: Secondary | ICD-10-CM | POA: Diagnosis not present

## 2022-02-25 DIAGNOSIS — Z09 Encounter for follow-up examination after completed treatment for conditions other than malignant neoplasm: Secondary | ICD-10-CM | POA: Diagnosis not present

## 2022-02-25 DIAGNOSIS — D649 Anemia, unspecified: Secondary | ICD-10-CM | POA: Diagnosis not present

## 2022-02-25 DIAGNOSIS — C160 Malignant neoplasm of cardia: Secondary | ICD-10-CM | POA: Diagnosis not present

## 2022-02-25 DIAGNOSIS — E440 Moderate protein-calorie malnutrition: Secondary | ICD-10-CM | POA: Diagnosis not present

## 2022-02-25 DIAGNOSIS — I2510 Atherosclerotic heart disease of native coronary artery without angina pectoris: Secondary | ICD-10-CM | POA: Diagnosis not present

## 2022-03-03 ENCOUNTER — Telehealth: Payer: Self-pay

## 2022-03-03 NOTE — Telephone Encounter (Signed)
TC from the [facility] stated the patient isn't eating or drinking. She wanting to know if she can hydrate the patient by putting water in his J- tube. Advise the nurse to speak with Michaelle Birks MD at [facility].  ?

## 2022-03-04 ENCOUNTER — Inpatient Hospital Stay: Payer: Medicare PPO | Attending: Oncology | Admitting: Oncology

## 2022-03-04 ENCOUNTER — Other Ambulatory Visit: Payer: Self-pay

## 2022-03-04 ENCOUNTER — Telehealth: Payer: Self-pay | Admitting: Nutrition

## 2022-03-04 VITALS — BP 110/76 | HR 100 | Temp 97.8°F | Resp 18 | Ht 72.0 in | Wt 198.2 lb

## 2022-03-04 DIAGNOSIS — D509 Iron deficiency anemia, unspecified: Secondary | ICD-10-CM | POA: Insufficient documentation

## 2022-03-04 DIAGNOSIS — E039 Hypothyroidism, unspecified: Secondary | ICD-10-CM | POA: Diagnosis not present

## 2022-03-04 DIAGNOSIS — G4730 Sleep apnea, unspecified: Secondary | ICD-10-CM | POA: Diagnosis not present

## 2022-03-04 DIAGNOSIS — I10 Essential (primary) hypertension: Secondary | ICD-10-CM | POA: Insufficient documentation

## 2022-03-04 DIAGNOSIS — C160 Malignant neoplasm of cardia: Secondary | ICD-10-CM | POA: Insufficient documentation

## 2022-03-04 DIAGNOSIS — I2510 Atherosclerotic heart disease of native coronary artery without angina pectoris: Secondary | ICD-10-CM | POA: Insufficient documentation

## 2022-03-04 DIAGNOSIS — E119 Type 2 diabetes mellitus without complications: Secondary | ICD-10-CM | POA: Diagnosis not present

## 2022-03-04 NOTE — Telephone Encounter (Signed)
Patient S/P total gastrectomy with jejunostomy feeding tube placement. Noted constipation per MD note with recommendation for Mira lax. No edema. ? ?Received request to contact patient regarding home jejunostomy TF. Patient had weight loss at recent MD visit after hospital discharge and reports he is not really able to tolerate liquids or soft foods much. He is unable to provide information on quantities of food but it does not sound like it is contributing much to overall nutrition needs. Reports gagging after oral intake so he cannot tolerate much. ? ?Reports Vital 1.5 is running at 60 mL from 4 pm to 8 am daily. Also states it only is one bag of food (4 cartons) but he is not sure. He knows his wife gives some additional protein with some water before and after infusion. (Prosource TF 45 ml TID ordered) ? ?Weight on 3/23 was 198.2 pounds decreased from 234 pounds on Dec 8  ? ?Estimated Needs: 2400-2600 calories, 130-145 gm pro, 2.4 L fluid ? ?Vital 1.5 (total ~4 cartons) at 60 mL/hr over 16 hours provides 1440 cal, 64 gm pro, 724 mL fluid ?Prosource TF, 45 mL TID provides 120 cal, 33 gm protein ?Total 1560 calories, 97 gm protein, 724 mL free water ? ?To provide greater than 90 % estimated needs, patient will require: ?6 cartons of Vital 1.5 to provide 2130 cal, 96 gm pro, 1086 mL water ?Prosource TF, 45 mL TID provides 120 cal, 33 gm protein ?Total 2250 calories, 129 gm protein, 1086 mL water (needs additional 1200 mL free water flushes) ? ?Briefly educated patient to communicate with his wife to give 6 cartons tonight and I will follow up tomorrow to clarify new TF regimen. Stressed importance of patient pushing free water intake by mouth if tolerated. Attempted to call patient's wife but she was unavailable. Will call tomorrow. Left message on wife's voicemail. ? ? ? ? ?

## 2022-03-04 NOTE — Progress Notes (Signed)
?[place] ?OFFICE PROGRESS NOTE ? ? ?Diagnosis: Gastroesophageal cancer ? ?INTERVAL HISTORY:  ? ?[name] underwent a total gastrectomy with a Roux-en-Y esophagojejunostomy, lymphadenectomy, and omentectomy on 02/11/2022.  There was no evidence of metastatic disease.  A mass was noted in the proximal stomach near the GE junction.  A jejunostomy feeding tube was placed. ? ?The pathology revealed moderately to poorly differentiated adenocarcinoma measuring 2.5 cm and extending into the muscularis propria.  The resection margins are negative.  Metastatic carcinoma was identified in 2 of 28 lymph nodes.  The tumor was located at the GE junction.  There was inflammation and fibrosis consistent with a moderate treatment effect.  Lymphovascular invasion was noted. ?He was discharged in the hospital 02/20/2022.  He is participating in home physical therapy.  He continues jejunostomy feedings.  He is tolerating some liquid and food by mouth.  He complains of a "choking "sensation.  He has constipation.  He started MiraLAX yesterday. ?Objective: ? ?Vital signs in last 24 hours: ? ?Blood pressure 110/76, pulse 100, temperature 97.8 ?F (36.6 ?C), temperature source Oral, resp. rate 18, height 6' (1.829 m), weight 198 lb 3.2 oz (89.9 kg), SpO2 98 %. ?  ? ?HEENT: No thrush ?Resp: Distant breath sounds, lungs clear bilaterally ?Cardio: Regular rate and rhythm ?GI: Healed midline incision with glue in place, left upper quadrant feeding tube ?Vascular: No leg edema ?  ? ?Lab Results: ? ?Lab Results  ?Component Value Date  ? WBC 5.7 02/15/2022  ? HGB 10.2 (L) 02/15/2022  ? HCT 31.9 (L) 02/15/2022  ? MCV 81.8 02/15/2022  ? PLT 170 02/15/2022  ? NEUTROABS 1.8 01/20/2022  ? ? ?CMP  ?Lab Results  ?Component Value Date  ? NA 137 02/15/2022  ? K 3.5 02/15/2022  ? CL 104 02/15/2022  ? CO2 24 02/15/2022  ? GLUCOSE 149 (H) 02/15/2022  ? BUN 11 02/15/2022  ? CREATININE 0.87 02/18/2022  ? CALCIUM 8.7 (L) 02/15/2022  ? PROT 7.0  02/10/2022  ? ALBUMIN 4.0 02/10/2022  ? AST 18 02/10/2022  ? ALT 19 02/10/2022  ? ALKPHOS 94 02/10/2022  ? BILITOT 0.6 02/10/2022  ? GFRNONAA >60 02/18/2022  ? ? ?Lab Results  ?Component Value Date  ? CEA 1.50 10/30/2021  ? ? ?Lab Results  ?Component Value Date  ? INR 1.0 02/10/2022  ? LABPROT 13.1 02/10/2022  ? ? ?Imaging: ? ?No results found. ? ?Medications: I have reviewed the patient's current medications. ? ? ?Assessment/Plan: ?Gastric cancer ?CT abdomen/pelvis 10/08/2021-probable mass in the proximal stomach, enlarged gastropathic ligament node ?Upper endoscopy 10/01/2021-gastric cardia/fundus mass with oozing and stigmata of recent bleeding-biopsy at least intramucosal adenocarcinoma, mismatch repair protein expression intact, PD-L1 combined positive score 7%, HER2 positive ?PET 10/20/2021-hypermetabolic proximal gastric primary, or hypermetabolic gastropathic ligament node, right paramidline prostatic hypermetabolism, anal hypermetabolism without a CT mass ?Radiation 11/11/2021-12/22/2021 ?Cycle 1  Taxol/carboplatin 11/12/2021 ?Cycle 2 Taxol/carboplatin 11/19/2021 ?Cycle 3 Taxol/carboplatin 11/26/2021 ?Cycle 4 Taxol/carboplatin 12/03/2021 ?Cycle 5 Taxol/carboplatin 12/18/2021 ?PET 01/19/2022-decreased hypermetabolism at the gastric cardia, decreased size and metabolic activity associated with an enlarged gastrohepatic node, persistent anal hypermetabolism, stable lateral left lower lobe nodule, possible new more medial subpleural left lower lobe nodule-both nodules below PET resolution ?02/11/2022-total gastrectomy with Roux-en-Y esophagojejunostomy, placement of jejunal feeding tube,ypT2,ypN1 moderate to poorly differentiated adenocarcinoma at the GE junction measuring 2.5 x 2.3 cm, lymphovascular invasion present, negative resection margins, 2/28 lymph nodes ? ?Microcytic anemia secondary to #1 ?Diabetes ?Hypertension ?Coronary artery disease, cardiac catheterization 08/11/2021-placement of  proximal and distal right  coronary artery stents ?Hypothyroidism ?Sleep apnea ?Anal hypermetabolism on the PET 10/20/2021-no mass identified ? ? ? ? ?Disposition: ?[name] has been diagnosed with pathologic stage II gastroesophageal cancer.  He underwent a total gastrectomy 3-23.  Tumor was found at the GE junction.  The resection margins are negative. ?I discussed the pathology findings and prognosis with [name] and his wife.  I discussed recent data confirming a benefit of adjuvant nivolumab in this setting.  I recommend adjuvant nivolumab.  We reviewed potential toxicities associated with nivolumab including the chance of a rash, diarrhea, and various autoimmune toxicities.  He was given reading materials on nivolumab. ? ?He continues to recover from the recent surgery.  He will return for an office visit and further discussion in 2 weeks.  The plan is to begin nivolumab after the next office visit. ? ?Betsy Coder, MD ? ?03/04/2022  ?8:48 AM ? ? ?

## 2022-03-05 ENCOUNTER — Telehealth: Payer: Self-pay | Admitting: Nutrition

## 2022-03-05 NOTE — Telephone Encounter (Signed)
Contacted patient's wife by telephone.  She reports patient unable to tolerate much tube feeding last night secondary to bloating.  She reports he is constipated and has not been able to have a bowel movement.  She has given him MiraLAX but it has not elicited a bowel movement yet. ? ?Patient's wife flushing feeding tube with 30 to 60 mL of free water approximately 8 times daily.  He is still only sipping on fluids and I am unable to determine how many ounces of fluid this is providing.  Patient requires an additional 1200 mL of free water daily.  Instructed wife to infuse 8 ounces free water 5 times a day very slowly.  Assess tolerance.  If unable to tolerate a full 8 ounces, reduce total flushes to 4 ounces 10 times daily.  Explained tube feeding and free water is infusing directly into the jejunum and so patient will not tolerate large amounts of fluid at 1 time ? ?Wife states understanding.  She reports she has encourage patient to get up and move around today.  She has no questions or concerns about tube feeding and agrees to gradually increase volume of vital 1.5-6 cartons daily once patient's constipation has resolved.  Continue Prosource TF 3 times daily.  Continue fluids by mouth as tolerated. ? ?**Disclaimer: This note was dictated with voice recognition software. Similar sounding words can inadvertently be transcribed and this note may contain transcription errors which may not have been corrected upon publication of note.** ? ?

## 2022-03-09 ENCOUNTER — Encounter: Payer: Self-pay | Admitting: *Deleted

## 2022-03-09 NOTE — Progress Notes (Signed)
Received home health orders from [facility] re: tube feeding and wound/incision care. ?Per Dr. Benay Spice: These need to be sent to his surgeon, Dr. Michaelle Birks. ?Faxed order forms back with message to send to surgeon. Dr. Benay Spice is not managing this. ?

## 2022-03-10 ENCOUNTER — Other Ambulatory Visit (HOSPITAL_COMMUNITY): Payer: Self-pay | Admitting: Surgery

## 2022-03-10 ENCOUNTER — Other Ambulatory Visit (HOSPITAL_COMMUNITY): Payer: Self-pay | Admitting: Family Medicine

## 2022-03-10 ENCOUNTER — Other Ambulatory Visit (HOSPITAL_COMMUNITY): Payer: Self-pay | Admitting: Pediatrics

## 2022-03-10 DIAGNOSIS — Z903 Acquired absence of stomach [part of]: Secondary | ICD-10-CM

## 2022-03-17 ENCOUNTER — Other Ambulatory Visit: Payer: Self-pay

## 2022-03-17 DIAGNOSIS — C160 Malignant neoplasm of cardia: Secondary | ICD-10-CM

## 2022-03-18 ENCOUNTER — Encounter: Payer: Self-pay | Admitting: Nurse Practitioner

## 2022-03-18 ENCOUNTER — Inpatient Hospital Stay: Payer: Medicare PPO | Attending: Oncology | Admitting: Nurse Practitioner

## 2022-03-18 ENCOUNTER — Inpatient Hospital Stay: Payer: Medicare PPO

## 2022-03-18 VITALS — BP 116/79 | HR 92 | Temp 97.8°F | Resp 18 | Ht 72.0 in | Wt 190.6 lb

## 2022-03-18 DIAGNOSIS — C160 Malignant neoplasm of cardia: Secondary | ICD-10-CM

## 2022-03-18 DIAGNOSIS — E039 Hypothyroidism, unspecified: Secondary | ICD-10-CM | POA: Diagnosis not present

## 2022-03-18 DIAGNOSIS — G4730 Sleep apnea, unspecified: Secondary | ICD-10-CM | POA: Diagnosis not present

## 2022-03-18 DIAGNOSIS — T451X5A Adverse effect of antineoplastic and immunosuppressive drugs, initial encounter: Secondary | ICD-10-CM | POA: Insufficient documentation

## 2022-03-18 DIAGNOSIS — I2510 Atherosclerotic heart disease of native coronary artery without angina pectoris: Secondary | ICD-10-CM | POA: Diagnosis not present

## 2022-03-18 DIAGNOSIS — I10 Essential (primary) hypertension: Secondary | ICD-10-CM | POA: Diagnosis not present

## 2022-03-18 DIAGNOSIS — Z9221 Personal history of antineoplastic chemotherapy: Secondary | ICD-10-CM | POA: Diagnosis not present

## 2022-03-18 DIAGNOSIS — E119 Type 2 diabetes mellitus without complications: Secondary | ICD-10-CM | POA: Diagnosis not present

## 2022-03-18 DIAGNOSIS — D6481 Anemia due to antineoplastic chemotherapy: Secondary | ICD-10-CM | POA: Diagnosis not present

## 2022-03-18 LAB — CMP (CANCER CENTER ONLY)
ALT: 20 U/L (ref 0–44)
AST: 17 U/L (ref 15–41)
Albumin: 4.2 g/dL (ref 3.5–5.0)
Alkaline Phosphatase: 146 U/L — ABNORMAL HIGH (ref 38–126)
Anion gap: 12 (ref 5–15)
BUN: 23 mg/dL (ref 8–23)
CO2: 25 mmol/L (ref 22–32)
Calcium: 10.2 mg/dL (ref 8.9–10.3)
Chloride: 99 mmol/L (ref 98–111)
Creatinine: 0.95 mg/dL (ref 0.61–1.24)
GFR, Estimated: >60 mL/min (ref >60)
Glucose, Bld: 177 mg/dL — ABNORMAL HIGH (ref 70–99)
Potassium: 3.9 mmol/L (ref 3.5–5.1)
Sodium: 136 mmol/L (ref 135–145)
Total Bilirubin: 0.6 mg/dL (ref 0.3–1.2)
Total Protein: 8.3 g/dL — ABNORMAL HIGH (ref 6.5–8.1)

## 2022-03-18 LAB — PREALBUMIN: Prealbumin: 19.2 mg/dL (ref 18–38)

## 2022-03-18 NOTE — Progress Notes (Signed)
?  [facility] ?OFFICE PROGRESS NOTE ? ? ?Diagnosis: Gastroesophageal cancer ? ?INTERVAL HISTORY:  ? ?Zachary Bowers returns as scheduled.  Tube feed has been decreased to 40 mL/h.  He has instructions to increase based on tolerance.  Main complaint today is constipation.  He tried Colace and developed hives.  He has had several doses of MiraLAX.  Most recently he began milk of magnesia.  He plans to try a suppository today.   ? ?Objective: ? ?Vital signs in last 24 hours: ? ?Blood pressure 116/79, pulse 92, temperature 97.8 ?F (36.6 ?C), temperature source Oral, resp. rate 18, height 6' (1.829 m), weight 190 lb 9.6 oz (86.5 kg), SpO2 100 %. ?  ? ?Resp: Lungs clear bilaterally. ?Cardio: Regular rate and rhythm. ?GI: Healed midline incision.  Feeding tube site is without erythema. ?Vascular: No leg edema. ? ? ? ?Lab Results: ? ?Lab Results  ?Component Value Date  ? WBC 5.7 02/15/2022  ? HGB 10.2 (L) 02/15/2022  ? HCT 31.9 (L) 02/15/2022  ? MCV 81.8 02/15/2022  ? PLT 170 02/15/2022  ? NEUTROABS 1.8 01/20/2022  ? ? ?Imaging: ? ?No results found. ? ?Medications: I have reviewed the patient's current medications. ? ?Assessment/Plan: ?Gastric cancer ?CT abdomen/pelvis 10/08/2021-probable mass in the proximal stomach, enlarged gastropathic ligament node ?Upper endoscopy 10/01/2021-gastric cardia/fundus mass with oozing and stigmata of recent bleeding-biopsy at least intramucosal adenocarcinoma, mismatch repair protein expression intact, PD-L1 combined positive score 7%, HER2 positive ?PET 10/20/2021-hypermetabolic proximal gastric primary, or hypermetabolic gastropathic ligament node, right paramidline prostatic hypermetabolism, anal hypermetabolism without a CT mass ?Radiation 11/11/2021-12/22/2021 ?Cycle 1  Taxol/carboplatin 11/12/2021 ?Cycle 2 Taxol/carboplatin 11/19/2021 ?Cycle 3 Taxol/carboplatin 11/26/2021 ?Cycle 4 Taxol/carboplatin 12/03/2021 ?Cycle 5 Taxol/carboplatin 12/18/2021 ?PET 01/19/2022-decreased  hypermetabolism at the gastric cardia, decreased size and metabolic activity associated with an enlarged gastrohepatic node, persistent anal hypermetabolism, stable lateral left lower lobe nodule, possible new more medial subpleural left lower lobe nodule-both nodules below PET resolution ?02/11/2022-total gastrectomy with Roux-en-Y esophagojejunostomy, placement of jejunal feeding tube,ypT2,ypN1 moderate to poorly differentiated adenocarcinoma at the GE junction measuring 2.5 x 2.3 cm, lymphovascular invasion present, negative resection margins, 2/28 lymph nodes ?  ?Microcytic anemia secondary to #1 ?Diabetes ?Hypertension ?Coronary artery disease, cardiac catheterization 08/11/2021-placement of proximal and distal right coronary artery stents ?Hypothyroidism ?Sleep apnea ?Anal hypermetabolism on the PET 10/20/2021-no mass identified ? ?Disposition: Zachary Bowers appears stable.  He continues to recover from the surgery last month.  We again discussed beginning adjuvant nivolumab.  Potential toxicities were reviewed.  He is not ready to make a decision on beginning immunotherapy. ? ?He will return for a follow-up appointment on 04/02/2022 to reevaluate, potentially proceed with nivolumab that day. ? ?Patient seen with Dr. Benay Spice. ? ? ? ?Ned Card ANP/GNP-BC  ? ?03/18/2022  ?9:53 AM ?This was a shared visit with Ned Card.  Zachary Bowers continues to recover from the gastrectomy procedure.  We discussed adjuvant nivolumab again today.  He does not wish to begin adjuvant therapy at present.  He will be scheduled for a follow-up visit with the plan to initiate nivolumab on 04/02/2022.  A treatment plan was entered today. ? ?I was present for greater than 50% of today's visit.  I performed medical decision making. ? ?[name], MD ? ? ? ? ? ? ?

## 2022-03-19 ENCOUNTER — Encounter: Payer: Self-pay | Admitting: Oncology

## 2022-03-19 ENCOUNTER — Other Ambulatory Visit: Payer: Self-pay

## 2022-03-19 ENCOUNTER — Ambulatory Visit (HOSPITAL_COMMUNITY)
Admission: RE | Admit: 2022-03-19 | Discharge: 2022-03-19 | Disposition: A | Discharge status: Home / Self Care | Payer: Medicare PPO | Source: Ambulatory Visit | Attending: Surgery | Admitting: Surgery

## 2022-03-19 DIAGNOSIS — R1310 Dysphagia, unspecified: Secondary | ICD-10-CM | POA: Diagnosis not present

## 2022-03-19 DIAGNOSIS — Z903 Acquired absence of stomach [part of]: Secondary | ICD-10-CM

## 2022-03-19 DIAGNOSIS — Z980 Intestinal bypass and anastomosis status: Secondary | ICD-10-CM | POA: Diagnosis not present

## 2022-03-19 DIAGNOSIS — R634 Abnormal weight loss: Secondary | ICD-10-CM | POA: Diagnosis not present

## 2022-03-19 MED ORDER — IOHEXOL 300 MG/ML  SOLN
100.0000 mL | Freq: Once | INTRAMUSCULAR | Status: AC | PRN
  Administered 2022-03-19: 100 mL via ORAL

## 2022-03-26 ENCOUNTER — Other Ambulatory Visit: Payer: Self-pay | Admitting: Oncology

## 2022-04-02 ENCOUNTER — Inpatient Hospital Stay: Payer: Medicare PPO

## 2022-04-02 ENCOUNTER — Inpatient Hospital Stay: Payer: Medicare PPO | Admitting: Oncology

## 2022-04-21 DIAGNOSIS — I2510 Atherosclerotic heart disease of native coronary artery without angina pectoris: Secondary | ICD-10-CM | POA: Diagnosis not present

## 2022-04-21 DIAGNOSIS — E538 Deficiency of other specified B group vitamins: Secondary | ICD-10-CM | POA: Diagnosis not present

## 2022-04-21 DIAGNOSIS — E7800 Pure hypercholesterolemia, unspecified: Secondary | ICD-10-CM | POA: Diagnosis not present

## 2022-04-21 DIAGNOSIS — C160 Malignant neoplasm of cardia: Secondary | ICD-10-CM | POA: Diagnosis not present

## 2022-04-21 DIAGNOSIS — E039 Hypothyroidism, unspecified: Secondary | ICD-10-CM | POA: Diagnosis not present

## 2022-04-21 DIAGNOSIS — K5904 Chronic idiopathic constipation: Secondary | ICD-10-CM | POA: Diagnosis not present

## 2022-04-21 DIAGNOSIS — I10 Essential (primary) hypertension: Secondary | ICD-10-CM | POA: Diagnosis not present

## 2022-04-21 DIAGNOSIS — E1142 Type 2 diabetes mellitus with diabetic polyneuropathy: Secondary | ICD-10-CM | POA: Diagnosis not present

## 2022-04-21 DIAGNOSIS — D500 Iron deficiency anemia secondary to blood loss (chronic): Secondary | ICD-10-CM | POA: Diagnosis not present

## 2022-05-04 ENCOUNTER — Other Ambulatory Visit: Payer: Self-pay | Admitting: *Deleted

## 2022-05-04 ENCOUNTER — Encounter: Payer: Self-pay | Admitting: Nurse Practitioner

## 2022-05-04 ENCOUNTER — Inpatient Hospital Stay: Payer: Medicare PPO | Admitting: Nurse Practitioner

## 2022-05-04 ENCOUNTER — Inpatient Hospital Stay: Payer: Medicare PPO

## 2022-05-04 ENCOUNTER — Encounter: Payer: Self-pay | Admitting: Oncology

## 2022-05-04 VITALS — BP 121/86 | HR 105 | Temp 98.2°F | Resp 18 | Wt 170.6 lb

## 2022-05-04 DIAGNOSIS — Z955 Presence of coronary angioplasty implant and graft: Secondary | ICD-10-CM | POA: Insufficient documentation

## 2022-05-04 DIAGNOSIS — R1084 Generalized abdominal pain: Secondary | ICD-10-CM | POA: Diagnosis present

## 2022-05-04 DIAGNOSIS — I10 Essential (primary) hypertension: Secondary | ICD-10-CM | POA: Insufficient documentation

## 2022-05-04 DIAGNOSIS — R634 Abnormal weight loss: Secondary | ICD-10-CM | POA: Insufficient documentation

## 2022-05-04 DIAGNOSIS — R627 Adult failure to thrive: Secondary | ICD-10-CM | POA: Diagnosis present

## 2022-05-04 DIAGNOSIS — R531 Weakness: Secondary | ICD-10-CM | POA: Diagnosis not present

## 2022-05-04 DIAGNOSIS — Z794 Long term (current) use of insulin: Secondary | ICD-10-CM | POA: Diagnosis not present

## 2022-05-04 DIAGNOSIS — Z515 Encounter for palliative care: Secondary | ICD-10-CM | POA: Diagnosis not present

## 2022-05-04 DIAGNOSIS — Z4682 Encounter for fitting and adjustment of non-vascular catheter: Secondary | ICD-10-CM | POA: Diagnosis not present

## 2022-05-04 DIAGNOSIS — E43 Unspecified severe protein-calorie malnutrition: Secondary | ICD-10-CM | POA: Diagnosis present

## 2022-05-04 DIAGNOSIS — R066 Hiccough: Secondary | ICD-10-CM | POA: Diagnosis not present

## 2022-05-04 DIAGNOSIS — R7989 Other specified abnormal findings of blood chemistry: Secondary | ICD-10-CM | POA: Diagnosis not present

## 2022-05-04 DIAGNOSIS — R000 Tachycardia, unspecified: Secondary | ICD-10-CM | POA: Diagnosis not present

## 2022-05-04 DIAGNOSIS — D649 Anemia, unspecified: Secondary | ICD-10-CM | POA: Diagnosis not present

## 2022-05-04 DIAGNOSIS — R112 Nausea with vomiting, unspecified: Secondary | ICD-10-CM | POA: Diagnosis present

## 2022-05-04 DIAGNOSIS — N179 Acute kidney failure, unspecified: Secondary | ICD-10-CM | POA: Diagnosis not present

## 2022-05-04 DIAGNOSIS — G4730 Sleep apnea, unspecified: Secondary | ICD-10-CM | POA: Insufficient documentation

## 2022-05-04 DIAGNOSIS — C160 Malignant neoplasm of cardia: Secondary | ICD-10-CM | POA: Insufficient documentation

## 2022-05-04 DIAGNOSIS — Z66 Do not resuscitate: Secondary | ICD-10-CM | POA: Diagnosis not present

## 2022-05-04 DIAGNOSIS — R188 Other ascites: Secondary | ICD-10-CM | POA: Diagnosis not present

## 2022-05-04 DIAGNOSIS — D509 Iron deficiency anemia, unspecified: Secondary | ICD-10-CM | POA: Insufficient documentation

## 2022-05-04 DIAGNOSIS — E039 Hypothyroidism, unspecified: Secondary | ICD-10-CM | POA: Insufficient documentation

## 2022-05-04 DIAGNOSIS — I2583 Coronary atherosclerosis due to lipid rich plaque: Secondary | ICD-10-CM | POA: Diagnosis present

## 2022-05-04 DIAGNOSIS — R110 Nausea: Secondary | ICD-10-CM | POA: Insufficient documentation

## 2022-05-04 DIAGNOSIS — R0603 Acute respiratory distress: Secondary | ICD-10-CM | POA: Diagnosis not present

## 2022-05-04 DIAGNOSIS — R160 Hepatomegaly, not elsewhere classified: Secondary | ICD-10-CM | POA: Diagnosis not present

## 2022-05-04 DIAGNOSIS — E119 Type 2 diabetes mellitus without complications: Secondary | ICD-10-CM | POA: Insufficient documentation

## 2022-05-04 DIAGNOSIS — C801 Malignant (primary) neoplasm, unspecified: Secondary | ICD-10-CM | POA: Diagnosis not present

## 2022-05-04 DIAGNOSIS — Z5111 Encounter for antineoplastic chemotherapy: Secondary | ICD-10-CM | POA: Diagnosis not present

## 2022-05-04 DIAGNOSIS — Z7189 Other specified counseling: Secondary | ICD-10-CM | POA: Diagnosis not present

## 2022-05-04 DIAGNOSIS — R197 Diarrhea, unspecified: Secondary | ICD-10-CM | POA: Diagnosis not present

## 2022-05-04 DIAGNOSIS — E871 Hypo-osmolality and hyponatremia: Secondary | ICD-10-CM | POA: Diagnosis not present

## 2022-05-04 DIAGNOSIS — D630 Anemia in neoplastic disease: Secondary | ICD-10-CM | POA: Diagnosis present

## 2022-05-04 DIAGNOSIS — E785 Hyperlipidemia, unspecified: Secondary | ICD-10-CM | POA: Diagnosis present

## 2022-05-04 DIAGNOSIS — F05 Delirium due to known physiological condition: Secondary | ICD-10-CM | POA: Diagnosis not present

## 2022-05-04 DIAGNOSIS — K7682 Hepatic encephalopathy: Secondary | ICD-10-CM | POA: Diagnosis not present

## 2022-05-04 DIAGNOSIS — I2510 Atherosclerotic heart disease of native coronary artery without angina pectoris: Secondary | ICD-10-CM | POA: Insufficient documentation

## 2022-05-04 DIAGNOSIS — Z934 Other artificial openings of gastrointestinal tract status: Secondary | ICD-10-CM | POA: Diagnosis not present

## 2022-05-04 DIAGNOSIS — Z85028 Personal history of other malignant neoplasm of stomach: Secondary | ICD-10-CM | POA: Diagnosis not present

## 2022-05-04 DIAGNOSIS — N3289 Other specified disorders of bladder: Secondary | ICD-10-CM | POA: Diagnosis not present

## 2022-05-04 DIAGNOSIS — R140 Abdominal distension (gaseous): Secondary | ICD-10-CM | POA: Diagnosis not present

## 2022-05-04 DIAGNOSIS — K5900 Constipation, unspecified: Secondary | ICD-10-CM | POA: Diagnosis present

## 2022-05-04 DIAGNOSIS — G9340 Encephalopathy, unspecified: Secondary | ICD-10-CM | POA: Diagnosis not present

## 2022-05-04 DIAGNOSIS — I6529 Occlusion and stenosis of unspecified carotid artery: Secondary | ICD-10-CM | POA: Diagnosis not present

## 2022-05-04 DIAGNOSIS — C787 Secondary malignant neoplasm of liver and intrahepatic bile duct: Secondary | ICD-10-CM | POA: Diagnosis present

## 2022-05-04 DIAGNOSIS — R7401 Elevation of levels of liver transaminase levels: Secondary | ICD-10-CM | POA: Diagnosis not present

## 2022-05-04 NOTE — Progress Notes (Unsigned)
[facility] OFFICE PROGRESS NOTE   Diagnosis: Gastroesophageal cancer  INTERVAL HISTORY:   Zachary Bowers was last seen 03/18/2022.  Adjuvant nivolumab was discussed at that visit.  He did not return for scheduled follow-up on 04/02/2022.  He saw Dr. Zenia Resides on 04/30/2022.  He was noted to have had significant weight loss and poor oral intake.  J-tube feeds were resumed.  He estimates tube feedings about 5 hours in a 24-hour timeframe.  He continues to be constipated.  Appetite is poor.  He is able to tolerate water by mouth.  His wife notes that he is very weak and has lost quite a bit of weight.  No significant nausea/vomiting.  Objective:  Vital signs in last 24 hours:  Blood pressure 121/86, pulse (!) 105, temperature 98.2 F (36.8 C), temperature source Tympanic, resp. rate 18, weight 170 lb 9.6 oz (77.4 kg), SpO2 98 %.    HEENT: White coating over tongue.  No buccal thrush.  Mouth appears moist. Resp: Lungs clear bilaterally. Cardio: Regular, mildly tachycardic. GI: Feeding tube left abdomen.  Left abdomen is soft.  Diffuse firmness with associated tenderness throughout the right abdomen. Vascular: No leg edema. Skin: Mild decrease in skin turgor.   Lab Results:  Lab Results  Component Value Date   WBC 5.7 02/15/2022   HGB 10.2 (L) 02/15/2022   HCT 31.9 (L) 02/15/2022   MCV 81.8 02/15/2022   PLT 170 02/15/2022   NEUTROABS 1.8 01/20/2022    Imaging:  No results found.  Medications: I have reviewed the patient's current medications.  Assessment/Plan: Gastric cancer CT abdomen/pelvis 10/08/2021-probable mass in the proximal stomach, enlarged gastropathic ligament node Upper endoscopy 10/01/2021-gastric cardia/fundus mass with oozing and stigmata of recent bleeding-biopsy at least intramucosal adenocarcinoma, mismatch repair protein expression intact, PD-L1 combined positive score 7%, HER2 positive PET 10/20/2021-hypermetabolic proximal gastric primary, or  hypermetabolic gastropathic ligament node, right paramidline prostatic hypermetabolism, anal hypermetabolism without a CT mass Radiation 11/11/2021-12/22/2021 Cycle 1  Taxol/carboplatin 11/12/2021 Cycle 2 Taxol/carboplatin 11/19/2021 Cycle 3 Taxol/carboplatin 11/26/2021 Cycle 4 Taxol/carboplatin 12/03/2021 Cycle 5 Taxol/carboplatin 12/18/2021 PET 01/19/2022-decreased hypermetabolism at the gastric cardia, decreased size and metabolic activity associated with an enlarged gastrohepatic node, persistent anal hypermetabolism, stable lateral left lower lobe nodule, possible new more medial subpleural left lower lobe nodule-both nodules below PET resolution 02/11/2022-total gastrectomy with Roux-en-Y esophagojejunostomy, placement of jejunal feeding tube,ypT2,ypN1 moderate to poorly differentiated adenocarcinoma at the GE junction measuring 2.5 x 2.3 cm, lymphovascular invasion present, negative resection margins, 2/28 lymph nodes   Microcytic anemia secondary to #1 Diabetes Hypertension Coronary artery disease, cardiac catheterization 08/11/2021-placement of proximal and distal right coronary artery stents Hypothyroidism Sleep apnea Anal hypermetabolism on the PET 10/20/2021-no mass identified  Disposition: Zachary Bowers is close to 3 months out from the gastrectomy.  We had previously discussed adjuvant immunotherapy.  He was undecided at the time of his last visit and did not return for follow-up.  He saw Dr. Zenia Resides recently and was noted to have significant weight loss and poor oral intake.  Tube feedings had been resumed though he is not able to tolerate for the recommended time.  On exam there is diffuse firmness with associated tenderness throughout the right abdomen.  We are referring him for CT scans.  He will return for follow-up 05/07/2022.  He and his wife understand in his current condition he is not a candidate to begin adjuvant immunotherapy.  Patient seen with Dr. Benay Spice.    Ned Card  ANP/GNP-BC  05/04/2022  10:30 AM  This was a shared visit with Ned Card.  [name] was interviewed and examined.  He continues to have a poor performance status.  There is firmness and fullness in the right upper abdomen.  He will be referred for a CT of the abdomen/pelvis and return for an office visit later this week. He is not a candidate to begin adjuvant immunotherapy at present.  Was present for greater than 50% of today's visit.  I performed medical decision making.  [name], MD

## 2022-05-04 NOTE — Progress Notes (Signed)
Patient failed to go to lab after OV today. Labs needed for CT tomorrow. Notified scheduler to reschedule lab for later today or tomorrow prior to scan and call patient.

## 2022-05-05 ENCOUNTER — Encounter (HOSPITAL_BASED_OUTPATIENT_CLINIC_OR_DEPARTMENT_OTHER): Payer: Self-pay

## 2022-05-05 ENCOUNTER — Other Ambulatory Visit: Payer: Self-pay

## 2022-05-05 ENCOUNTER — Encounter: Payer: Self-pay | Admitting: Oncology

## 2022-05-05 ENCOUNTER — Telehealth: Payer: Self-pay | Admitting: *Deleted

## 2022-05-05 ENCOUNTER — Emergency Department (HOSPITAL_BASED_OUTPATIENT_CLINIC_OR_DEPARTMENT_OTHER): Payer: Medicare PPO

## 2022-05-05 ENCOUNTER — Inpatient Hospital Stay (HOSPITAL_BASED_OUTPATIENT_CLINIC_OR_DEPARTMENT_OTHER): Payer: Medicare PPO | Admitting: Nurse Practitioner

## 2022-05-05 ENCOUNTER — Ambulatory Visit (HOSPITAL_BASED_OUTPATIENT_CLINIC_OR_DEPARTMENT_OTHER): Admission: RE | Admit: 2022-05-05 | Payer: Medicare PPO | Source: Ambulatory Visit

## 2022-05-05 ENCOUNTER — Inpatient Hospital Stay (HOSPITAL_BASED_OUTPATIENT_CLINIC_OR_DEPARTMENT_OTHER)
Admission: EM | Admit: 2022-05-05 | Discharge: 2022-06-12 | DRG: 374 | Disposition: E | Payer: Medicare PPO | Attending: Internal Medicine | Admitting: Internal Medicine

## 2022-05-05 ENCOUNTER — Inpatient Hospital Stay: Payer: Medicare PPO

## 2022-05-05 DIAGNOSIS — Z87891 Personal history of nicotine dependence: Secondary | ICD-10-CM

## 2022-05-05 DIAGNOSIS — R140 Abdominal distension (gaseous): Secondary | ICD-10-CM | POA: Diagnosis not present

## 2022-05-05 DIAGNOSIS — T451X5A Adverse effect of antineoplastic and immunosuppressive drugs, initial encounter: Secondary | ICD-10-CM | POA: Diagnosis present

## 2022-05-05 DIAGNOSIS — E291 Testicular hypofunction: Secondary | ICD-10-CM | POA: Diagnosis present

## 2022-05-05 DIAGNOSIS — E785 Hyperlipidemia, unspecified: Secondary | ICD-10-CM | POA: Diagnosis not present

## 2022-05-05 DIAGNOSIS — R1084 Generalized abdominal pain: Secondary | ICD-10-CM

## 2022-05-05 DIAGNOSIS — Z66 Do not resuscitate: Secondary | ICD-10-CM | POA: Diagnosis not present

## 2022-05-05 DIAGNOSIS — Z9221 Personal history of antineoplastic chemotherapy: Secondary | ICD-10-CM

## 2022-05-05 DIAGNOSIS — Z7982 Long term (current) use of aspirin: Secondary | ICD-10-CM

## 2022-05-05 DIAGNOSIS — E119 Type 2 diabetes mellitus without complications: Secondary | ICD-10-CM | POA: Diagnosis not present

## 2022-05-05 DIAGNOSIS — I10 Essential (primary) hypertension: Secondary | ICD-10-CM | POA: Diagnosis present

## 2022-05-05 DIAGNOSIS — K5900 Constipation, unspecified: Secondary | ICD-10-CM | POA: Diagnosis present

## 2022-05-05 DIAGNOSIS — C160 Malignant neoplasm of cardia: Secondary | ICD-10-CM

## 2022-05-05 DIAGNOSIS — Z6827 Body mass index (BMI) 27.0-27.9, adult: Secondary | ICD-10-CM

## 2022-05-05 DIAGNOSIS — R066 Hiccough: Secondary | ICD-10-CM | POA: Diagnosis not present

## 2022-05-05 DIAGNOSIS — G4733 Obstructive sleep apnea (adult) (pediatric): Secondary | ICD-10-CM | POA: Diagnosis present

## 2022-05-05 DIAGNOSIS — R0603 Acute respiratory distress: Secondary | ICD-10-CM | POA: Diagnosis not present

## 2022-05-05 DIAGNOSIS — N3289 Other specified disorders of bladder: Secondary | ICD-10-CM | POA: Diagnosis not present

## 2022-05-05 DIAGNOSIS — E43 Unspecified severe protein-calorie malnutrition: Secondary | ICD-10-CM | POA: Diagnosis not present

## 2022-05-05 DIAGNOSIS — Z888 Allergy status to other drugs, medicaments and biological substances status: Secondary | ICD-10-CM

## 2022-05-05 DIAGNOSIS — Z934 Other artificial openings of gastrointestinal tract status: Secondary | ICD-10-CM | POA: Diagnosis not present

## 2022-05-05 DIAGNOSIS — R7401 Elevation of levels of liver transaminase levels: Secondary | ICD-10-CM | POA: Diagnosis present

## 2022-05-05 DIAGNOSIS — C787 Secondary malignant neoplasm of liver and intrahepatic bile duct: Secondary | ICD-10-CM | POA: Diagnosis present

## 2022-05-05 DIAGNOSIS — D630 Anemia in neoplastic disease: Secondary | ICD-10-CM | POA: Diagnosis not present

## 2022-05-05 DIAGNOSIS — R112 Nausea with vomiting, unspecified: Secondary | ICD-10-CM | POA: Diagnosis not present

## 2022-05-05 DIAGNOSIS — C801 Malignant (primary) neoplasm, unspecified: Secondary | ICD-10-CM | POA: Diagnosis not present

## 2022-05-05 DIAGNOSIS — I2583 Coronary atherosclerosis due to lipid rich plaque: Secondary | ICD-10-CM | POA: Diagnosis not present

## 2022-05-05 DIAGNOSIS — Z794 Long term (current) use of insulin: Secondary | ICD-10-CM

## 2022-05-05 DIAGNOSIS — E039 Hypothyroidism, unspecified: Secondary | ICD-10-CM | POA: Diagnosis present

## 2022-05-05 DIAGNOSIS — F05 Delirium due to known physiological condition: Secondary | ICD-10-CM | POA: Diagnosis not present

## 2022-05-05 DIAGNOSIS — Z515 Encounter for palliative care: Secondary | ICD-10-CM | POA: Diagnosis not present

## 2022-05-05 DIAGNOSIS — E871 Hypo-osmolality and hyponatremia: Secondary | ICD-10-CM | POA: Diagnosis not present

## 2022-05-05 DIAGNOSIS — K7682 Hepatic encephalopathy: Secondary | ICD-10-CM | POA: Diagnosis not present

## 2022-05-05 DIAGNOSIS — Z800 Family history of malignant neoplasm of digestive organs: Secondary | ICD-10-CM

## 2022-05-05 DIAGNOSIS — R197 Diarrhea, unspecified: Secondary | ICD-10-CM | POA: Diagnosis not present

## 2022-05-05 DIAGNOSIS — R188 Other ascites: Secondary | ICD-10-CM | POA: Diagnosis not present

## 2022-05-05 DIAGNOSIS — Z5111 Encounter for antineoplastic chemotherapy: Secondary | ICD-10-CM

## 2022-05-05 DIAGNOSIS — Z79890 Hormone replacement therapy (postmenopausal): Secondary | ICD-10-CM

## 2022-05-05 DIAGNOSIS — Z79899 Other long term (current) drug therapy: Secondary | ICD-10-CM

## 2022-05-05 DIAGNOSIS — R7989 Other specified abnormal findings of blood chemistry: Secondary | ICD-10-CM

## 2022-05-05 DIAGNOSIS — I2510 Atherosclerotic heart disease of native coronary artery without angina pectoris: Secondary | ICD-10-CM | POA: Diagnosis present

## 2022-05-05 DIAGNOSIS — N179 Acute kidney failure, unspecified: Secondary | ICD-10-CM | POA: Diagnosis not present

## 2022-05-05 DIAGNOSIS — E079 Disorder of thyroid, unspecified: Secondary | ICD-10-CM

## 2022-05-05 DIAGNOSIS — Z903 Acquired absence of stomach [part of]: Secondary | ICD-10-CM

## 2022-05-05 DIAGNOSIS — C169 Malignant neoplasm of stomach, unspecified: Secondary | ICD-10-CM | POA: Diagnosis present

## 2022-05-05 DIAGNOSIS — R627 Adult failure to thrive: Secondary | ICD-10-CM | POA: Diagnosis present

## 2022-05-05 DIAGNOSIS — Z8249 Family history of ischemic heart disease and other diseases of the circulatory system: Secondary | ICD-10-CM

## 2022-05-05 DIAGNOSIS — Z955 Presence of coronary angioplasty implant and graft: Secondary | ICD-10-CM

## 2022-05-05 DIAGNOSIS — Z7401 Bed confinement status: Secondary | ICD-10-CM

## 2022-05-05 LAB — CBC WITH DIFFERENTIAL (CANCER CENTER ONLY)
Abs Immature Granulocytes: 0.03 10*3/uL (ref 0.00–0.07)
Basophils Absolute: 0 10*3/uL (ref 0.0–0.1)
Basophils Relative: 0 %
Eosinophils Absolute: 0 10*3/uL (ref 0.0–0.5)
Eosinophils Relative: 0 %
HCT: 38 % — ABNORMAL LOW (ref 39.0–52.0)
Hemoglobin: 11.8 g/dL — ABNORMAL LOW (ref 13.0–17.0)
Immature Granulocytes: 0 %
Lymphocytes Relative: 15 %
Lymphs Abs: 1.1 10*3/uL (ref 0.7–4.0)
MCH: 24.8 pg — ABNORMAL LOW (ref 26.0–34.0)
MCHC: 31.1 g/dL (ref 30.0–36.0)
MCV: 79.8 fL — ABNORMAL LOW (ref 80.0–100.0)
Monocytes Absolute: 0.7 10*3/uL (ref 0.1–1.0)
Monocytes Relative: 9 %
Neutro Abs: 5.4 10*3/uL (ref 1.7–7.7)
Neutrophils Relative %: 76 %
Platelet Count: 488 10*3/uL — ABNORMAL HIGH (ref 150–400)
RBC: 4.76 MIL/uL (ref 4.22–5.81)
RDW: 15.9 % — ABNORMAL HIGH (ref 11.5–15.5)
WBC Count: 7.2 10*3/uL (ref 4.0–10.5)
nRBC: 0 % (ref 0.0–0.2)

## 2022-05-05 LAB — CBC WITH DIFFERENTIAL/PLATELET
Abs Immature Granulocytes: 0.05 10*3/uL (ref 0.00–0.07)
Basophils Absolute: 0 10*3/uL (ref 0.0–0.1)
Basophils Relative: 0 %
Eosinophils Absolute: 0 10*3/uL (ref 0.0–0.5)
Eosinophils Relative: 0 %
HCT: 36.6 % — ABNORMAL LOW (ref 39.0–52.0)
Hemoglobin: 11.5 g/dL — ABNORMAL LOW (ref 13.0–17.0)
Immature Granulocytes: 1 %
Lymphocytes Relative: 7 %
Lymphs Abs: 0.5 10*3/uL — ABNORMAL LOW (ref 0.7–4.0)
MCH: 25.2 pg — ABNORMAL LOW (ref 26.0–34.0)
MCHC: 31.4 g/dL (ref 30.0–36.0)
MCV: 80.1 fL (ref 80.0–100.0)
Monocytes Absolute: 0.7 10*3/uL (ref 0.1–1.0)
Monocytes Relative: 9 %
Neutro Abs: 6.7 10*3/uL (ref 1.7–7.7)
Neutrophils Relative %: 83 %
Platelets: 459 10*3/uL — ABNORMAL HIGH (ref 150–400)
RBC: 4.57 MIL/uL (ref 4.22–5.81)
RDW: 15.9 % — ABNORMAL HIGH (ref 11.5–15.5)
WBC: 7.9 10*3/uL (ref 4.0–10.5)
nRBC: 0 % (ref 0.0–0.2)

## 2022-05-05 LAB — CMP (CANCER CENTER ONLY)
ALT: 151 U/L — ABNORMAL HIGH (ref 0–44)
AST: 491 U/L (ref 15–41)
Albumin: 3.9 g/dL (ref 3.5–5.0)
Alkaline Phosphatase: 810 U/L — ABNORMAL HIGH (ref 38–126)
Anion gap: 11 (ref 5–15)
BUN: 21 mg/dL (ref 8–23)
CO2: 28 mmol/L (ref 22–32)
Calcium: 10.1 mg/dL (ref 8.9–10.3)
Chloride: 96 mmol/L — ABNORMAL LOW (ref 98–111)
Creatinine: 0.89 mg/dL (ref 0.61–1.24)
GFR, Estimated: >60 mL/min (ref >60)
Glucose, Bld: 123 mg/dL — ABNORMAL HIGH (ref 70–99)
Potassium: 4.1 mmol/L (ref 3.5–5.1)
Sodium: 135 mmol/L (ref 135–145)
Total Bilirubin: 2.6 mg/dL — ABNORMAL HIGH (ref 0.3–1.2)
Total Protein: 8.3 g/dL — ABNORMAL HIGH (ref 6.5–8.1)

## 2022-05-05 LAB — LACTIC ACID, PLASMA
Lactic Acid, Venous: 2.4 mmol/L (ref 0.5–1.9)
Lactic Acid, Venous: 2 mmol/L (ref 0.5–1.9)

## 2022-05-05 LAB — URINALYSIS, ROUTINE W REFLEX MICROSCOPIC
Bilirubin Urine: NEGATIVE
Cellular Cast, UA: 1
Glucose, UA: NEGATIVE mg/dL
Hgb urine dipstick: NEGATIVE
Ketones, ur: 5 mg/dL — AB
Leukocytes,Ua: NEGATIVE
Nitrite: NEGATIVE
Protein, ur: 100 mg/dL — AB
Specific Gravity, Urine: >1.046 — ABNORMAL HIGH (ref 1.005–1.030)
pH: 5 (ref 5.0–8.0)

## 2022-05-05 LAB — TROPONIN I (HIGH SENSITIVITY)
Troponin I (High Sensitivity): 13 ng/L (ref <18)
Troponin I (High Sensitivity): 12 ng/L (ref <18)

## 2022-05-05 LAB — COMPREHENSIVE METABOLIC PANEL
ALT: 162 U/L — ABNORMAL HIGH (ref 0–44)
AST: 513 U/L — ABNORMAL HIGH (ref 15–41)
Albumin: 3.8 g/dL (ref 3.5–5.0)
Alkaline Phosphatase: 794 U/L — ABNORMAL HIGH (ref 38–126)
Anion gap: 11 (ref 5–15)
BUN: 23 mg/dL (ref 8–23)
CO2: 29 mmol/L (ref 22–32)
Calcium: 9.9 mg/dL (ref 8.9–10.3)
Chloride: 94 mmol/L — ABNORMAL LOW (ref 98–111)
Creatinine, Ser: 1.01 mg/dL (ref 0.61–1.24)
GFR, Estimated: >60 mL/min (ref >60)
Glucose, Bld: 124 mg/dL — ABNORMAL HIGH (ref 70–99)
Potassium: 4.8 mmol/L (ref 3.5–5.1)
Sodium: 134 mmol/L — ABNORMAL LOW (ref 135–145)
Total Bilirubin: 2.4 mg/dL — ABNORMAL HIGH (ref 0.3–1.2)
Total Protein: 8.3 g/dL — ABNORMAL HIGH (ref 6.5–8.1)

## 2022-05-05 LAB — LIPASE, BLOOD: Lipase: 57 U/L — ABNORMAL HIGH (ref 11–51)

## 2022-05-05 LAB — PROTIME-INR
INR: 1 (ref 0.8–1.2)
Prothrombin Time: 13.2 seconds (ref 11.4–15.2)

## 2022-05-05 LAB — AMMONIA: Ammonia: 25 umol/L (ref 9–35)

## 2022-05-05 MED ORDER — INSULIN ASPART 100 UNIT/ML IJ SOLN
0.0000 [IU] | INTRAMUSCULAR | Status: DC
  Administered 2022-05-06: 1 [IU] via SUBCUTANEOUS
  Administered 2022-05-07: 2 [IU] via SUBCUTANEOUS
  Administered 2022-05-07: 5 [IU] via SUBCUTANEOUS
  Administered 2022-05-07: 3 [IU] via SUBCUTANEOUS
  Administered 2022-05-08 (×4): 2 [IU] via SUBCUTANEOUS
  Administered 2022-05-08: 3 [IU] via SUBCUTANEOUS
  Administered 2022-05-08 – 2022-05-09 (×2): 2 [IU] via SUBCUTANEOUS
  Administered 2022-05-09 (×2): 3 [IU] via SUBCUTANEOUS

## 2022-05-05 MED ORDER — ASPIRIN 81 MG PO CHEW
81.0000 mg | CHEWABLE_TABLET | Freq: Every day | ORAL | Status: DC
  Administered 2022-05-06 – 2022-05-20 (×15): 81 mg
  Filled 2022-05-05 (×15): qty 1

## 2022-05-05 MED ORDER — MORPHINE SULFATE (PF) 2 MG/ML IV SOLN
2.0000 mg | INTRAVENOUS | Status: DC | PRN
  Administered 2022-05-06: 4 mg via INTRAVENOUS
  Administered 2022-05-06 – 2022-05-07 (×4): 2 mg via INTRAVENOUS
  Administered 2022-05-07: 4 mg via INTRAVENOUS
  Administered 2022-05-07 – 2022-05-08 (×3): 2 mg via INTRAVENOUS
  Administered 2022-05-08 (×2): 4 mg via INTRAVENOUS
  Administered 2022-05-09: 2 mg via INTRAVENOUS
  Administered 2022-05-09: 4 mg via INTRAVENOUS
  Administered 2022-05-09: 2 mg via INTRAVENOUS
  Administered 2022-05-09: 4 mg via INTRAVENOUS
  Administered 2022-05-10 (×2): 2 mg via INTRAVENOUS
  Administered 2022-05-10: 4 mg via INTRAVENOUS
  Administered 2022-05-13: 2 mg via INTRAVENOUS
  Administered 2022-05-13: 4 mg via INTRAVENOUS
  Administered 2022-05-14 – 2022-05-18 (×2): 2 mg via INTRAVENOUS
  Administered 2022-05-18: 4 mg via INTRAVENOUS
  Administered 2022-05-18 – 2022-05-20 (×6): 2 mg via INTRAVENOUS
  Administered 2022-05-21: 4 mg via INTRAVENOUS
  Filled 2022-05-05: qty 1
  Filled 2022-05-05: qty 2
  Filled 2022-05-05 (×5): qty 1
  Filled 2022-05-05 (×3): qty 2
  Filled 2022-05-05 (×5): qty 1
  Filled 2022-05-05: qty 2
  Filled 2022-05-05 (×2): qty 1
  Filled 2022-05-05: qty 2
  Filled 2022-05-05 (×4): qty 1
  Filled 2022-05-05: qty 2
  Filled 2022-05-05 (×2): qty 1
  Filled 2022-05-05 (×3): qty 2
  Filled 2022-05-05: qty 1

## 2022-05-05 MED ORDER — SODIUM CHLORIDE 0.9 % IV BOLUS
1000.0000 mL | Freq: Once | INTRAVENOUS | Status: AC
Start: 2022-05-05 — End: 2022-05-05
  Administered 2022-05-05: 1000 mL via INTRAVENOUS

## 2022-05-05 MED ORDER — LEVOTHYROXINE SODIUM 25 MCG PO TABS
125.0000 ug | ORAL_TABLET | Freq: Every day | ORAL | Status: DC
  Administered 2022-05-06 – 2022-05-21 (×16): 125 ug
  Filled 2022-05-05 (×16): qty 1

## 2022-05-05 MED ORDER — PIPERACILLIN-TAZOBACTAM 3.375 G IVPB
3.3750 g | Freq: Three times a day (TID) | INTRAVENOUS | Status: DC
  Administered 2022-05-05 – 2022-05-08 (×7): 3.375 g via INTRAVENOUS
  Filled 2022-05-05 (×7): qty 50

## 2022-05-05 MED ORDER — ONDANSETRON HCL 4 MG/2ML IJ SOLN
4.0000 mg | Freq: Once | INTRAMUSCULAR | Status: AC
  Administered 2022-05-05: 4 mg via INTRAVENOUS
  Filled 2022-05-05: qty 2

## 2022-05-05 MED ORDER — MORPHINE SULFATE (PF) 4 MG/ML IV SOLN
4.0000 mg | Freq: Once | INTRAVENOUS | Status: AC
Start: 2022-05-05 — End: 2022-05-05
  Administered 2022-05-05: 4 mg via INTRAVENOUS
  Filled 2022-05-05: qty 1

## 2022-05-05 MED ORDER — ONDANSETRON HCL 4 MG/2ML IJ SOLN
4.0000 mg | Freq: Four times a day (QID) | INTRAMUSCULAR | Status: DC | PRN
  Administered 2022-05-09 – 2022-05-14 (×4): 4 mg via INTRAVENOUS
  Filled 2022-05-05 (×4): qty 2

## 2022-05-05 MED ORDER — AMLODIPINE BESYLATE 10 MG PO TABS
10.0000 mg | ORAL_TABLET | Freq: Every day | ORAL | Status: DC
  Administered 2022-05-06 – 2022-05-20 (×15): 10 mg
  Filled 2022-05-05 (×15): qty 1

## 2022-05-05 MED ORDER — ACETAMINOPHEN 650 MG RE SUPP: 650.0000 mg | Freq: Four times a day (QID) | RECTAL | Status: DC | PRN

## 2022-05-05 MED ORDER — ENOXAPARIN SODIUM 40 MG/0.4ML IJ SOSY
40.0000 mg | PREFILLED_SYRINGE | INTRAMUSCULAR | Status: DC
  Administered 2022-05-06 – 2022-05-20 (×15): 40 mg via SUBCUTANEOUS
  Filled 2022-05-05 (×15): qty 0.4

## 2022-05-05 MED ORDER — LACTATED RINGERS IV SOLN: INTRAVENOUS | Status: DC

## 2022-05-05 MED ORDER — IRBESARTAN 150 MG PO TABS
150.0000 mg | ORAL_TABLET | Freq: Every day | ORAL | Status: DC
  Administered 2022-05-06 – 2022-05-08 (×3): 150 mg via ORAL
  Filled 2022-05-05 (×3): qty 1

## 2022-05-05 MED ORDER — ONDANSETRON HCL 4 MG PO TABS
4.0000 mg | ORAL_TABLET | Freq: Four times a day (QID) | ORAL | Status: DC | PRN
  Administered 2022-05-12: 4 mg via ORAL
  Filled 2022-05-05: qty 1

## 2022-05-05 MED ORDER — ATORVASTATIN CALCIUM 40 MG PO TABS
80.0000 mg | ORAL_TABLET | Freq: Every day | ORAL | Status: DC
  Administered 2022-05-06 – 2022-05-08 (×3): 80 mg
  Filled 2022-05-05 (×3): qty 2

## 2022-05-05 MED ORDER — ACETAMINOPHEN 325 MG PO TABS: 650.0000 mg | ORAL_TABLET | Freq: Four times a day (QID) | ORAL | Status: DC | PRN

## 2022-05-05 MED ORDER — IOHEXOL 300 MG/ML  SOLN
100.0000 mL | Freq: Once | INTRAMUSCULAR | Status: AC | PRN
  Administered 2022-05-05: 85 mL via INTRAVENOUS

## 2022-05-05 NOTE — ED Provider Notes (Signed)
[facility] EMERGENCY DEPT Provider Note   CSN: [id number] Arrival date & time: 04/29/2022  1605     History  Chief Complaint  Patient presents with   Weakness   Abdominal Pain    Zachary Bowers is a 66 y.o. male.  The history is provided by the patient and medical records. No language interpreter was used.  Abdominal Pain Pain location:  Generalized Pain quality: aching   Pain radiates to:  Does not radiate Pain severity:  Severe Onset quality:  Gradual Duration:  3 days Timing:  Constant Progression:  Waxing and waning Chronicity:  New Context: previous surgery   Context: not trauma   Relieved by:  Nothing Worsened by:  Nothing Ineffective treatments:  None tried Associated symptoms: belching, constipation, fatigue, flatus, nausea and vomiting   Associated symptoms: no chest pain, no chills, no cough, no diarrhea, no dysuria, no fever and no shortness of breath       Home Medications Prior to Admission medications   Medication Sig Start Date End Date Taking? Authorizing Provider  acetaminophen (TYLENOL) 160 MG/5ML solution Take 20.3 mLs (650 mg total) by mouth every 6 (six) hours as needed for mild pain. 02/20/22   Maczis, Barth Kirks, PA-C  amLODipine (NORVASC) 10 MG tablet Take 10 mg by mouth daily. 07/06/21   [provider]  aspirin 81 MG tablet Take 81 mg by mouth daily.    [provider]  atorvastatin (LIPITOR) 80 MG tablet Take 1 tablet (80 mg total) by mouth daily. 08/11/21   Cheryln Manly, NP  blood glucose meter kit and supplies KIT Dispense based on patient and insurance preference. Use up to four times daily as directed. 02/20/22   Maczis, Barth Kirks, PA-C  cyanocobalamin (,VITAMIN B-12,) 1000 MCG/ML injection Inject 1 mL (1,000 mcg total) into the muscle every 30 (thirty) days. 03/20/22   Maczis, Barth Kirks, PA-C  glucose blood test strip 1 each by Other route. Check blood sugar every morning    [provider]   insulin detemir (LEVEMIR) 100 UNIT/ML FlexPen Inject 12 Units into the skin daily. 02/20/22   Maczis, Barth Kirks, PA-C  Insulin Pen Needle 32G X 4 MM MISC To be used for insulin administration 02/20/22   Maczis, Barth Kirks, PA-C  isosorbide mononitrate (IMDUR) 30 MG 24 hr tablet Take 1 tablet (30 mg total) by mouth daily. Patient not taking: Reported on 02/05/2022 10/11/21 02/05/22  Darliss Cheney, MD  levothyroxine (SYNTHROID, LEVOTHROID) 125 MCG tablet Take 125 mcg by mouth daily before breakfast.  12/19/14   [provider]  nitroGLYCERIN (NITROSTAT) 0.4 MG SL tablet Place 1 tablet (0.4 mg total) under the tongue every 5 (five) minutes as needed for chest pain. 08/07/21 02/05/22  Skeet Latch, MD  Nutritional Supplements (FEEDING SUPPLEMENT, PROSOURCE TF,) liquid Place 45 mLs into feeding tube 3 (three) times daily. 02/20/22   Maczis, Barth Kirks, PA-C  oxyCODONE (ROXICODONE) 5 MG/5ML solution 5 mLs (5 mg total) by Per J Tube route every 6 (six) hours as needed for breakthrough pain. Patient not taking: Reported on 03/04/2022 02/20/22   Jillyn Ledger, PA-C  telmisartan (MICARDIS) 40 MG tablet Take 40 mg by mouth daily. 07/06/21   [provider]      Allergies    Colace [docusate] and Niaspan [niacin er]    Review of Systems   Review of Systems  Constitutional:  Positive for diaphoresis (per notes) and fatigue. Negative for chills and fever.  HENT:  Negative for  congestion.   Respiratory:  Negative for cough, chest tightness, shortness of breath, wheezing and stridor.   Cardiovascular:  Negative for chest pain and palpitations.  Gastrointestinal:  Positive for abdominal distention, abdominal pain, constipation, flatus, nausea and vomiting. Negative for diarrhea.  Genitourinary:  Negative for dysuria and flank pain.  Musculoskeletal:  Positive for back pain. Negative for neck pain and neck stiffness.  Skin:  Negative for rash and wound.  Neurological:  Negative for dizziness  and headaches.  Psychiatric/Behavioral:  Negative for agitation and confusion.   All other systems reviewed and are negative.  Physical Exam Updated Vital Signs BP 97/77 (BP Location: Right Arm)   Pulse (!) 129   Temp 98 F (36.7 C)   Resp 14   Ht 6' (1.829 m)   Wt 77.1 kg   SpO2 99%   BMI 23.06 kg/m  Physical Exam Vitals and nursing note reviewed.  Constitutional:      General: He is not in acute distress.    Appearance: He is well-developed. He is ill-appearing. He is not toxic-appearing or diaphoretic.  HENT:     Head: Normocephalic and atraumatic.  Eyes:     Extraocular Movements: Extraocular movements intact.     Conjunctiva/sclera: Conjunctivae normal.     Pupils: Pupils are equal, round, and reactive to light.  Cardiovascular:     Rate and Rhythm: Regular rhythm. Tachycardia present.     Heart sounds: Normal heart sounds. No murmur heard. Pulmonary:     Effort: Pulmonary effort is normal. No respiratory distress.     Breath sounds: Normal breath sounds.  Abdominal:     General: Abdomen is flat. A surgical scar is present. Bowel sounds are decreased.     Palpations: Abdomen is soft.     Tenderness: There is generalized abdominal tenderness. There is no right CVA tenderness, left CVA tenderness, guarding or rebound.  Musculoskeletal:        General: No swelling.     Cervical back: Neck supple.  Skin:    General: Skin is warm and dry.     Capillary Refill: Capillary refill takes less than 2 seconds.  Neurological:     Mental Status: He is alert.  Psychiatric:        Mood and Affect: Mood normal.    ED Results / Procedures / Treatments   Labs (all labs ordered are listed, but only abnormal results are displayed) Labs Reviewed  CBC WITH DIFFERENTIAL/PLATELET - Abnormal; Notable for the following components:      Result Value   Hemoglobin 11.5 (*)    HCT 36.6 (*)    MCH 25.2 (*)    RDW 15.9 (*)    Platelets 459 (*)    Lymphs Abs 0.5 (*)    All other  components within normal limits  COMPREHENSIVE METABOLIC PANEL - Abnormal; Notable for the following components:   Sodium 134 (*)    Chloride 94 (*)    Glucose, Bld 124 (*)    Total Protein 8.3 (*)    AST 513 (*)    ALT 162 (*)    Alkaline Phosphatase 794 (*)    Total Bilirubin 2.4 (*)    All other components within normal limits  LACTIC ACID, PLASMA - Abnormal; Notable for the following components:   Lactic Acid, Venous 2.4 (*)    All other components within normal limits  LIPASE, BLOOD - Abnormal; Notable for the following components:   Lipase 57 (*)    All other components  within normal limits  CULTURE, BLOOD (ROUTINE X 2)  CULTURE, BLOOD (ROUTINE X 2)  PROTIME-INR  LACTIC ACID, PLASMA  URINALYSIS, ROUTINE W REFLEX MICROSCOPIC  AMMONIA  TROPONIN I (HIGH SENSITIVITY)  TROPONIN I (HIGH SENSITIVITY)    EKG None  Radiology CT ABDOMEN PELVIS W CONTRAST  Result Date: 04/22/2022 CLINICAL DATA:  Weakness, elevated LFTs, history of gastric cancer status post gastrectomy EXAM: CT ABDOMEN AND PELVIS WITH CONTRAST TECHNIQUE: Multidetector CT imaging of the abdomen and pelvis was performed using the standard protocol following bolus administration of intravenous contrast. RADIATION DOSE REDUCTION: This exam was performed according to the departmental dose-optimization program which includes automated exposure control, adjustment of the mA and/or kV according to patient size and/or use of iterative reconstruction technique. CONTRAST:  21m OMNIPAQUE IOHEXOL 300 MG/ML  SOLN COMPARISON:  PET-CT dated 01/18/2022 FINDINGS: Lower chest: Trace bilateral pleural effusions, right greater than left. Associated right lower lobe atelectasis. Hepatobiliary: Innumerable hepatic metastases in both lobes, new. Dominant aggregate lesion measures 8.8 cm in the lateral segment left hepatic lobe (series 2/image 25). Index lesion in the inferior right hepatic lobe measures 4.8 cm (series 2/image 50). Gallbladder  is notable for layering sludge (series 2/image 52), without associated inflammatory changes. No intrahepatic or extrahepatic duct dilatation. Pancreas: Within normal limits. Spleen: Within normal limits. Adrenals/Urinary Tract: Adrenal glands are within normal limits. Kidneys are within normal limits.  No hydronephrosis. Bladder is mildly thick-walled although underdistended. Stomach/Bowel: Status post gastrectomy. Percutaneous jejunostomy.  No evidence of bowel obstruction. Appendix is not discretely visualized, reportedly surgically absent. No colonic wall thickening or mass is evident on CT. Vascular/Lymphatic: No evidence of abdominal aortic aneurysm. 14 mm short axis node in the portacaval region (series 2/image 33). Reproductive: Prostate is unremarkable. Other: Moderate abdominopelvic ascites, minimally complex. Musculoskeletal: Mild degenerative changes of the lumbar spine. IMPRESSION: Status post gastrectomy with percutaneous jejunostomy. Innumerable hepatic metastases, new, with index lesions as above. 14 mm short axis portacaval node, suspicious for nodal metastasis. Moderate abdominopelvic ascites. Trace bilateral pleural effusions. Electronically Signed   By: SJulian HyM.D.   On: 04/24/2022 19:28    Procedures Procedures    Medications Ordered in ED Medications  piperacillin-tazobactam (ZOSYN) IVPB 3.375 g (3.375 g Intravenous New Bag/Given 04/27/2022 2028)  ondansetron (ZOFRAN) injection 4 mg (4 mg Intravenous Given 04/20/2022 1849)  morphine (PF) 4 MG/ML injection 4 mg (4 mg Intravenous Given 05/08/2022 1851)  sodium chloride 0.9 % bolus 1,000 mL (0 mLs Intravenous Stopped 04/27/2022 2014)  iohexol (OMNIPAQUE) 300 MG/ML solution 100 mL (85 mLs Intravenous Contrast Given 05/08/2022 1907)    ED Course/ Medical Decision Making/ A&P                           Medical Decision Making Amount and/or Complexity of Data Reviewed Labs: ordered. Radiology: ordered.  Risk Prescription drug  management. Decision regarding hospitalization.    MGalen Russmanis a 66y.o. male with a past medical history significant for hypertension, CAD, hyperlipidemia, and stomach cancer status post stomach removal and has feeding tube who presents from his oncologist office for worsening abdominal pain, nausea, vomiting, fatigue, and lab abnormalities.  According to patient, for the last few weeks he has been fatigued but over the last few days started having severe abdominal pain.  Describes as waxing and waning but in his diffuse abdomen with nausea and vomiting.  He has been dry heaving.  He reports he has not had as  many bowel movements today and is still passing a small amount of gas.  He thinks his abdomen may be slightly distended.  He reports no urinary changes.  Denies fevers or chills.  Denies any cough, congestion, chest pain, or shortness of breath.  Denies any new leg pain or leg swelling.  Denies trauma.  On exam, abdomen is very tender.  He has a feeding tube in place without significant drainage.  Bowel sounds were diminished.  Back and flanks nontender.  Lungs clear.  Chest nontender.  I do not appreciate a murmur.  Intact pulses in extremities.  Given the patient's reported diaphoresis, severe abdominal pain, nausea, vomiting, and lab abnormalities today, I am concerned about either bowel obstruction or other significant intra-abdominal abnormality.  Cholecystitis also considered.  Had a discussion with family and we agree he will need admission.  We will get fluids, nausea medicine, pain medicine, CT scan, and labs.  Anticipate admission after work-up is completed.  8:01 PM CT scan returned with innumerable liver metastasis.  I suspect this is likely cause of his symptoms.  I spoke with oncology who agrees with admission to [facility] for pain control and further management.  They did not feel that GI would be very additive at this time but they do agree with empiric antibiotic  treatments given his tachycardia, tachypnea, and symptoms.  They recommended Zosyn so we will order Zosyn per pharmacy and will call for admission to [facility] for further management.         Final Clinical Impression(s) / ED Diagnoses Final diagnoses:  Generalized abdominal pain  Nausea and vomiting, unspecified vomiting type  LFT elevation    Clinical Impression: 1. Generalized abdominal pain   2. Nausea and vomiting, unspecified vomiting type   3. LFT elevation     Disposition: Admit  This note was prepared with assistance of Dragon voice recognition software. Occasional wrong-word or sound-a-like substitutions may have occurred due to the inherent limitations of voice recognition software.       Michaelyn Wall, Gwenyth Allegra, MD 05/04/2022 2042

## 2022-05-05 NOTE — Progress Notes (Signed)
  [facility] OFFICE PROGRESS NOTE   Diagnosis: Gastroesophageal cancer  INTERVAL HISTORY:   Mr. Ivins was seen in the office yesterday.  He was referred for CT scans.  He came to the office today for labs prior to the scans.  Liver test are markedly abnormal.  He notes continued weakness, nausea, poor oral intake.  No fever.  Objective:  Vital signs in last 24 hours:  Temperature 98.2, heart rate 109, blood pressure 123/84, respirations 28, oxygen saturation 98%    HEENT: Mucous membranes are moist. Resp: Lungs are clear, mildly decreased right lower lung field.  No respiratory distress. Cardio: Regular, tachycardic. GI: Abdomen is firm on the right side extending to nearly the level of the umbilicus.  J-tube. Vascular: No leg edema.  Lab Results:  Lab Results  Component Value Date   WBC 7.2 04/21/2022   HGB 11.8 (L) 04/28/2022   HCT 38.0 (L) 04/16/2022   MCV 79.8 (L) 04/25/2022   PLT 488 (H) 04/14/2022   NEUTROABS 5.4 04/26/2022    Imaging:  No results found.  Medications: I have reviewed the patient's current medications.  Assessment/Plan: Gastric cancer CT abdomen/pelvis 10/08/2021-probable mass in the proximal stomach, enlarged gastropathic ligament node Upper endoscopy 10/01/2021-gastric cardia/fundus mass with oozing and stigmata of recent bleeding-biopsy at least intramucosal adenocarcinoma, mismatch repair protein expression intact, PD-L1 combined positive score 7%, HER2 positive PET 10/20/2021-hypermetabolic proximal gastric primary, or hypermetabolic gastropathic ligament node, right paramidline prostatic hypermetabolism, anal hypermetabolism without a CT mass Radiation 11/11/2021-12/22/2021 Cycle 1  Taxol/carboplatin 11/12/2021 Cycle 2 Taxol/carboplatin 11/19/2021 Cycle 3 Taxol/carboplatin 11/26/2021 Cycle 4 Taxol/carboplatin 12/03/2021 Cycle 5 Taxol/carboplatin 12/18/2021 PET 01/19/2022-decreased hypermetabolism at the gastric cardia, decreased  size and metabolic activity associated with an enlarged gastrohepatic node, persistent anal hypermetabolism, stable lateral left lower lobe nodule, possible new more medial subpleural left lower lobe nodule-both nodules below PET resolution 02/11/2022-total gastrectomy with Roux-en-Y esophagojejunostomy, placement of jejunal feeding tube,ypT2,ypN1 moderate to poorly differentiated adenocarcinoma at the GE junction measuring 2.5 x 2.3 cm, lymphovascular invasion present, negative resection margins, 2/28 lymph nodes   Microcytic anemia secondary to #1 Diabetes Hypertension Coronary artery disease, cardiac catheterization 08/11/2021-placement of proximal and distal right coronary artery stents Hypothyroidism Sleep apnea Anal hypermetabolism on the PET 10/20/2021-no mass identified  Disposition: Mr. Fazzino continues to have significant failure to thrive.  Liver tests are markedly abnormal.  We are recommending hospitalization for further evaluation.  He agrees with this plan.  We are sending him to the [facility] emergency department for stabilization prior to admission.  Patient seen with Dr. Benay Spice.    Ned Card ANP/GNP-BC   04/27/2022  4:01 PM This was a shared visit with Ned Card.  Mr. Herda was interviewed and examined.  He continues to have a poor performance status.  He has nausea and abdominal pain.  He appears weaker compared to when we saw him yesterday.  The liver enzymes are markedly elevated.  It is unclear whether the liver enzyme elevation is related to biliary obstruction, tumor involving the liver, or another etiology.  I recommend hospital admission for further evaluation.  He will be referred to the emergency room to arrange for hospital admission.  I was present for greater than 50% of today's visit.  I performed medical decision making.  [name], MD

## 2022-05-05 NOTE — ED Notes (Signed)
Called [facility] to transport patient to [facility] room# [id number]

## 2022-05-05 NOTE — Assessment & Plan Note (Addendum)
Now with concerning findings for new metastatic disease to liver on CT scan.  New since PET-CT in Feb 2023 Per Dr. Benay Spice: 1) admit 2) call onc in AM for consult.  May also want to get Pal care involved given rapidly progressive metastatic dz of liver despite chemotherapy.

## 2022-05-05 NOTE — ED Notes (Signed)
Due to poor vein selection second blood cultures were unable to be obtained.

## 2022-05-05 NOTE — Assessment & Plan Note (Signed)
Cont home BP meds ?

## 2022-05-05 NOTE — Assessment & Plan Note (Addendum)
Intractable N/V, transaminitis, and CT findings highly suggestive of metastatic disease of liver (new since Feb 2023).  Presumably metastatic disease is from his known, recent, gastric carcinoma s/p surgical resection and chemo. 1. Got put on empiric zosyn 2. Consult Onc in AM 3. Repeat CMP in AM 4. NPO (holding tube feeds for the moment), except meds 5. zofran PRN 6. Morphine PRN 7. Will put in for a GI consult vs curbside too, if only for them to say that there isnt much that a biliary stent will do.

## 2022-05-05 NOTE — Progress Notes (Signed)
Pharmacy Antibiotic Note  Zachary Bowers is a 66 y.o. male admitted on 05/01/2022 presenting with weakness and abdominal pain, hx stomach cancer, feeding tube.  Pharmacy has been consulted for zosyn dosing.  Plan: Zosyn 3.375g IV q 8h (extended 4h infusion) Monitor renal function, Cx, clinical progression and Onc recs   Height: 6' (182.9 cm) Weight: 77.1 kg (170 lb) IBW/kg (Calculated) : 77.6  Temp (24hrs), Avg:98.1 F (36.7 C), Min:98 F (36.7 C), Max:98.2 F (36.8 C)  Recent Labs  Lab 05/03/2022 1434 04/18/2022 1838 04/12/2022 2030  WBC 7.2 7.9  --   CREATININE 0.89 1.01  --   LATICACIDVEN  --  2.4* 2.0*    Estimated Creatinine Clearance: 78.5 mL/min (by C-G formula based on SCr of 1.01 mg/dL).    Allergies  Allergen Reactions   Colace [Docusate] Hives   Niaspan Durene Cal Er] Itching    Bertis Ruddy, PharmD Clinical Pharmacist ED Pharmacist Phone # [phone] 05/01/2022 9:36 PM

## 2022-05-05 NOTE — Telephone Encounter (Signed)
Patient reported today for pre-CT labs and was feeling poorly and wished to lie down to await CT scan at 4 pm today.  CRITICAL VALUE STICKER  CRITICAL VALUE: AST [id number]  RECEIVER (on-site recipient of call):Kynzlie Hilleary,RN  DATE & TIME NOTIFIED: 04/23/2022 @ 1527  MESSENGER (representative from lab): Simona Huh  MD NOTIFIED: Dr. Benay Spice  TIME OF NOTIFICATION: 1530  RESPONSE:  Having CT scan today and patient examined by Dr. Benay Spice

## 2022-05-05 NOTE — ED Triage Notes (Signed)
Pt from cancer clinic, was there for labs for an outpatient CT scan scheduled at 4pm today. Per staff, liver enzymes came back elevated and pt appeared very weak, slumped on the chair. Per pt's wife, pt has been feeling weak x 2 weeks.   Pt endorses feeling sweaty, denies chest pain, no SOB. Pt pale and ill appearing.

## 2022-05-05 NOTE — H&P (Signed)
History and Physical    Patient: Zachary Bowers ZOX:[id number] DOB: September 12, 1956 DOA: 05/04/2022 DOS: the patient was seen and examined on 04/29/2022 PCP: Shirline Frees, MD  Patient coming from: Home  Chief Complaint:  Chief Complaint  Patient presents with   Weakness   Abdominal Pain   HPI: Zachary Bowers is a 66 y.o. male with medical history significant of CAD s/p stent, HTN, HLD, DM2.  Pt with recent history of stomach cancer s/p gastric resection and feeding tube.  On chemo therapy due to positive nodes.  Pt with 1 month h/o N/V, now progressed to abd pain and intractable N/V over past 3 days.  P[facility] to ED at [facility] for symptoms.  No fevers, chills, CP, diarrhea, SOB. Review of Systems: As mentioned in the history of present illness. All other systems reviewed and are negative. Past Medical History:  Diagnosis Date   Coronary artery disease    Diabetes mellitus without complication ([place])    Hyperlipidemia    Hypertension    Hypogonadism in male    OSA (obstructive sleep apnea)    Stomach cancer ([place])    Thyroid disease    Past Surgical History:  Procedure Laterality Date   APPENDECTOMY     APPENDECTOMY Right    BIOPSY  10/09/2021   Procedure: BIOPSY;  Surgeon: Wilford Corner, MD;  Location: [facility] ENDOSCOPY;  Service: Endoscopy;;   CARDIAC CATHETERIZATION     CORONARY STENT INTERVENTION N/A 08/11/2021   Procedure: CORONARY STENT INTERVENTION;  Surgeon: Jettie Booze, MD;  Location: [facility] CV LAB;  Service: Cardiovascular;  Laterality: N/A;   ESOPHAGOGASTRODUODENOSCOPY (EGD) WITH PROPOFOL N/A 10/09/2021   Procedure: ESOPHAGOGASTRODUODENOSCOPY (EGD) WITH PROPOFOL;  Surgeon: Wilford Corner, MD;  Location: [facility];  Service: Endoscopy;  Laterality: N/A;   GASTRECTOMY N/A 02/11/2022   Procedure: OPEN TOTAL GASTRECTOMY;  Surgeon: [name], MD;  Location: [facility];  Service: General;  Laterality: N/A;   INTRAVASCULAR ULTRASOUND/IVUS N/A 08/11/2021    Procedure: Intravascular Ultrasound/IVUS;  Surgeon: Jettie Booze, MD;  Location: [facility] CV LAB;  Service: Cardiovascular;  Laterality: N/A;   JEJUNOSTOMY N/A 02/11/2022   Procedure: FEEDING  JEJUNOSTOMY TUBE;  Surgeon: [name], MD;  Location: [facility];  Service: General;  Laterality: N/A;   JEJUNOSTOMY N/A 02/11/2022   Procedure: ESOPHAGOJEJUNOSTOMY;  Surgeon: Lajuana Matte, MD;  Location: [facility];  Service: Thoracic;  Laterality: N/A;   LAPAROSCOPY N/A 02/11/2022   Procedure: STAGING LAPAROSCOPY;  Surgeon: [name], MD;  Location: [facility];  Service: General;  Laterality: N/A;   LEFT HEART CATH AND CORONARY ANGIOGRAPHY N/A 08/11/2021   Procedure: LEFT HEART CATH AND CORONARY ANGIOGRAPHY;  Surgeon: Jettie Booze, MD;  Location: [facility] CV LAB;  Service: Cardiovascular;  Laterality: N/A;   Social History:  reports that he has quit smoking. He has quit using smokeless tobacco.  His smokeless tobacco use included chew. He reports that he does not drink alcohol and does not use drugs.  Allergies  Allergen Reactions   Colace [Docusate] Hives   Niaspan [Niacin Er] Itching    Family History  Problem Relation Age of Onset   Heart disease Mother    Alzheimer's disease Father    Stomach cancer Maternal Uncle     Prior to Admission medications   Medication Sig Start Date End Date Taking? Authorizing Provider  acetaminophen (TYLENOL) 160 MG/5ML solution Take 20.3 mLs (650 mg total) by mouth every 6 (six) hours as needed for mild pain.  Patient taking differently: Place 650 mg into feeding tube every 6 (six) hours as needed for mild pain. 02/20/22  Yes Maczis, Barth Kirks, PA-C  amLODipine (NORVASC) 10 MG tablet Place 10 mg into feeding tube daily. 07/06/21  Yes [provider]  aspirin 81 MG tablet Place 81 mg into feeding tube daily.   Yes [provider]  atorvastatin (LIPITOR) 80 MG tablet Take 1 tablet (80 mg total) by mouth daily. Patient taking  differently: Place 80 mg into feeding tube daily. 08/11/21  Yes Reino Bellis B, NP  cyanocobalamin (,VITAMIN B-12,) 1000 MCG/ML injection Inject 1 mL (1,000 mcg total) into the muscle every 30 (thirty) days. 03/20/22  Yes Maczis, Barth Kirks, PA-C  insulin detemir (LEVEMIR) 100 UNIT/ML FlexPen Inject 12 Units into the skin daily. Patient taking differently: Inject 12 Units into the skin daily. May take 6 units instead 02/20/22  Yes Maczis, Jereld Presti, PA-C  levothyroxine (SYNTHROID, LEVOTHROID) 125 MCG tablet Place 125 mcg into feeding tube daily before breakfast. 12/19/14  Yes [provider]  Nutritional Supplements (FEEDING SUPPLEMENT, PROSOURCE TF,) liquid Place 45 mLs into feeding tube 3 (three) times daily. 02/20/22  Yes Maczis, Barth Kirks, PA-C  telmisartan (MICARDIS) 40 MG tablet Place 40 mg into feeding tube daily. 07/06/21  Yes [provider]  blood glucose meter kit and supplies KIT Dispense based on patient and insurance preference. Use up to four times daily as directed. 02/20/22   Maczis, Barth Kirks, PA-C  glucose blood test strip 1 each by Other route. Check blood sugar every morning    [provider]  Insulin Pen Needle 32G X 4 MM MISC To be used for insulin administration 02/20/22   Maczis, Barth Kirks, PA-C  nitroGLYCERIN (NITROSTAT) 0.4 MG SL tablet Place 1 tablet (0.4 mg total) under the tongue every 5 (five) minutes as needed for chest pain. Patient not taking: Reported on 04/19/2022 08/07/21 02/05/22  Skeet Latch, MD  oxyCODONE (ROXICODONE) 5 MG/5ML solution 5 mLs (5 mg total) by Per J Tube route every 6 (six) hours as needed for breakthrough pain. Patient not taking: Reported on 03/04/2022 02/20/22   Jillyn Ledger, PA-C    Physical Exam: Vitals:   04/13/2022 1930 05/07/2022 2000 05/10/2022 2100 05/01/2022 2203  BP: 127/81 125/81 127/82 136/89  Pulse: 97 90 90 94  Resp: _0 Temp:   98.2 F (36.8 C) 97.9 F (36.6 C)  TempSrc:   Oral Oral  SpO2: 95%  96% 95% 98%  Weight:      Height:       Constitutional: NAD, ill appearing Eyes: PERRL, lids and conjunctivae normal ENMT: Mucous membranes are moist. Posterior pharynx clear of any exudate or lesions.Normal dentition.  Neck: normal, supple, no masses, no thyromegaly Respiratory: clear to auscultation bilaterally, no wheezing, no crackles. Normal respiratory effort. No accessory muscle use.  Cardiovascular: Regular rate and rhythm, no murmurs / rubs / gallops. No extremity edema. 2+ pedal pulses. No carotid bruits.  Abdomen: Generalized TTP without rebound or guarding. Musculoskeletal: no clubbing / cyanosis. No joint deformity upper and lower extremities. Good ROM, no contractures. Normal muscle tone.  Skin: no rashes, lesions, ulcers. No induration Neurologic: CN 2-12 grossly intact. Sensation intact, DTR normal. Strength 5/5 in all 4.  Psychiatric: Normal judgment and insight. Alert and oriented x 3. Normal mood.   Data Reviewed:    CMP     Component Value Date/Time   NA 134 (L) 04/24/2022 1838  NA 136 08/21/2021 1015   K 4.8 05/02/2022 1838   CL 94 (L) 05/07/2022 1838   CO2 29 04/23/2022 1838   GLUCOSE 124 (H) 04/17/2022 1838   BUN 23 04/24/2022 1838   BUN 23 08/21/2021 1015   CREATININE 1.01 04/30/2022 1838   CREATININE 0.89 04/18/2022 1434   CALCIUM 9.9 04/25/2022 1838   PROT 8.3 (H) 05/01/2022 1838   ALBUMIN 3.8 04/26/2022 1838   AST 513 (H) 05/11/2022 1838   AST 491 (HH) 04/30/2022 1434   ALT 162 (H) 04/16/2022 1838   ALT 151 (H) 04/26/2022 1434   ALKPHOS 794 (H) 04/19/2022 1838   BILITOT 2.4 (H) 04/25/2022 1838   BILITOT 2.6 (H) 05/07/2022 1434   GFRNONAA >60 04/25/2022 1838   GFRNONAA >60 04/18/2022 1434   CT AP = new metastatic disease to liver with innumerable mets.  Assessment and Plan: * Nausea & vomiting Intractable N/V, transaminitis, and CT findings highly suggestive of metastatic disease of liver (new since Feb 2023).  Presumably metastatic disease  is from his known, recent, gastric carcinoma s/p surgical resection and chemo. Got put on empiric zosyn Consult Onc in AM Repeat CMP in AM NPO (holding tube feeds for the moment), except meds zofran PRN Morphine PRN Will put in for a GI consult vs curbside too, if only for them to say that there isnt much that a biliary stent will do.  Gastric cancer [facility]) Now with concerning findings for new metastatic disease to liver on CT scan.  New since PET-CT in Feb 2023 Per Dr. Benay Spice: 1) admit 2) call onc in AM for consult.  May also want to get Pal care involved given rapidly progressive metastatic dz of liver despite chemotherapy.  Transaminitis Presumably due to above metastatic dz.  DM2 (diabetes mellitus, type 2) ([place]) Med rec pending. Sensitive SSI Q4H for now.  Essential hypertension Cont home BP meds      Advance Care Planning:   Code Status: Full Code  Consults: Onc and GI consults in AM  Family Communication: Family at bedside  Severity of Illness: The appropriate patient status for this patient is OBSERVATION. Observation status is judged to be reasonable and necessary in order to provide the required intensity of service to ensure the patient's safety. The patient's presenting symptoms, physical exam findings, and initial radiographic and laboratory data in the context of their medical condition is felt to place them at decreased risk for further clinical deterioration. Furthermore, it is anticipated that the patient will be medically stable for discharge from the hospital within 2 midnights of admission.   Author: Etta Quill., DO 04/18/2022 11:53 PM  For on call review [email or web address].

## 2022-05-05 NOTE — Assessment & Plan Note (Signed)
Med rec pending. Sensitive SSI Q4H for now.

## 2022-05-05 NOTE — Assessment & Plan Note (Signed)
Presumably due to above metastatic dz.

## 2022-05-06 ENCOUNTER — Inpatient Hospital Stay: Payer: Self-pay

## 2022-05-06 DIAGNOSIS — R112 Nausea with vomiting, unspecified: Secondary | ICD-10-CM | POA: Diagnosis present

## 2022-05-06 DIAGNOSIS — Z7189 Other specified counseling: Secondary | ICD-10-CM | POA: Diagnosis not present

## 2022-05-06 DIAGNOSIS — R0603 Acute respiratory distress: Secondary | ICD-10-CM | POA: Diagnosis not present

## 2022-05-06 DIAGNOSIS — R531 Weakness: Secondary | ICD-10-CM | POA: Diagnosis not present

## 2022-05-06 DIAGNOSIS — Z515 Encounter for palliative care: Secondary | ICD-10-CM | POA: Diagnosis not present

## 2022-05-06 DIAGNOSIS — K5900 Constipation, unspecified: Secondary | ICD-10-CM | POA: Diagnosis present

## 2022-05-06 DIAGNOSIS — R140 Abdominal distension (gaseous): Secondary | ICD-10-CM | POA: Diagnosis not present

## 2022-05-06 DIAGNOSIS — Z66 Do not resuscitate: Secondary | ICD-10-CM | POA: Diagnosis not present

## 2022-05-06 DIAGNOSIS — E43 Unspecified severe protein-calorie malnutrition: Secondary | ICD-10-CM | POA: Diagnosis present

## 2022-05-06 DIAGNOSIS — K7682 Hepatic encephalopathy: Secondary | ICD-10-CM | POA: Diagnosis not present

## 2022-05-06 DIAGNOSIS — C160 Malignant neoplasm of cardia: Secondary | ICD-10-CM | POA: Diagnosis present

## 2022-05-06 DIAGNOSIS — C787 Secondary malignant neoplasm of liver and intrahepatic bile duct: Secondary | ICD-10-CM | POA: Diagnosis present

## 2022-05-06 DIAGNOSIS — Z5111 Encounter for antineoplastic chemotherapy: Secondary | ICD-10-CM | POA: Diagnosis not present

## 2022-05-06 DIAGNOSIS — E785 Hyperlipidemia, unspecified: Secondary | ICD-10-CM | POA: Diagnosis present

## 2022-05-06 DIAGNOSIS — R066 Hiccough: Secondary | ICD-10-CM | POA: Diagnosis not present

## 2022-05-06 DIAGNOSIS — R7989 Other specified abnormal findings of blood chemistry: Secondary | ICD-10-CM | POA: Diagnosis not present

## 2022-05-06 DIAGNOSIS — R1084 Generalized abdominal pain: Secondary | ICD-10-CM | POA: Diagnosis present

## 2022-05-06 DIAGNOSIS — R7401 Elevation of levels of liver transaminase levels: Secondary | ICD-10-CM | POA: Diagnosis not present

## 2022-05-06 DIAGNOSIS — I10 Essential (primary) hypertension: Secondary | ICD-10-CM | POA: Diagnosis present

## 2022-05-06 DIAGNOSIS — E119 Type 2 diabetes mellitus without complications: Secondary | ICD-10-CM | POA: Diagnosis present

## 2022-05-06 DIAGNOSIS — D630 Anemia in neoplastic disease: Secondary | ICD-10-CM | POA: Diagnosis present

## 2022-05-06 DIAGNOSIS — Z934 Other artificial openings of gastrointestinal tract status: Secondary | ICD-10-CM | POA: Diagnosis not present

## 2022-05-06 DIAGNOSIS — R627 Adult failure to thrive: Secondary | ICD-10-CM | POA: Diagnosis present

## 2022-05-06 DIAGNOSIS — E871 Hypo-osmolality and hyponatremia: Secondary | ICD-10-CM | POA: Diagnosis not present

## 2022-05-06 DIAGNOSIS — F05 Delirium due to known physiological condition: Secondary | ICD-10-CM | POA: Diagnosis not present

## 2022-05-06 DIAGNOSIS — R110 Nausea: Secondary | ICD-10-CM | POA: Diagnosis not present

## 2022-05-06 DIAGNOSIS — N179 Acute kidney failure, unspecified: Secondary | ICD-10-CM | POA: Diagnosis not present

## 2022-05-06 DIAGNOSIS — I2583 Coronary atherosclerosis due to lipid rich plaque: Secondary | ICD-10-CM | POA: Diagnosis present

## 2022-05-06 DIAGNOSIS — R197 Diarrhea, unspecified: Secondary | ICD-10-CM | POA: Diagnosis not present

## 2022-05-06 DIAGNOSIS — E039 Hypothyroidism, unspecified: Secondary | ICD-10-CM | POA: Diagnosis present

## 2022-05-06 LAB — COMPREHENSIVE METABOLIC PANEL
ALT: 156 U/L — ABNORMAL HIGH (ref 0–44)
AST: 520 U/L — ABNORMAL HIGH (ref 15–41)
Albumin: 3 g/dL — ABNORMAL LOW (ref 3.5–5.0)
Alkaline Phosphatase: 659 U/L — ABNORMAL HIGH (ref 38–126)
Anion gap: 11 (ref 5–15)
BUN: 23 mg/dL (ref 8–23)
CO2: 27 mmol/L (ref 22–32)
Calcium: 9.4 mg/dL (ref 8.9–10.3)
Chloride: 100 mmol/L (ref 98–111)
Creatinine, Ser: 1.07 mg/dL (ref 0.61–1.24)
GFR, Estimated: >60 mL/min (ref >60)
Glucose, Bld: 90 mg/dL (ref 70–99)
Potassium: 4.2 mmol/L (ref 3.5–5.1)
Sodium: 138 mmol/L (ref 135–145)
Total Bilirubin: 2.1 mg/dL — ABNORMAL HIGH (ref 0.3–1.2)
Total Protein: 7.3 g/dL (ref 6.5–8.1)

## 2022-05-06 LAB — GLUCOSE, CAPILLARY
Glucose-Capillary: 103 mg/dL — ABNORMAL HIGH (ref 70–99)
Glucose-Capillary: 107 mg/dL — ABNORMAL HIGH (ref 70–99)
Glucose-Capillary: 130 mg/dL — ABNORMAL HIGH (ref 70–99)
Glucose-Capillary: 82 mg/dL (ref 70–99)
Glucose-Capillary: 90 mg/dL (ref 70–99)
Glucose-Capillary: 91 mg/dL (ref 70–99)

## 2022-05-06 LAB — MAGNESIUM
Magnesium: 2.4 mg/dL (ref 1.7–2.4)
Magnesium: 2.2 mg/dL (ref 1.7–2.4)

## 2022-05-06 LAB — CBC
HCT: 33.2 % — ABNORMAL LOW (ref 39.0–52.0)
Hemoglobin: 10.7 g/dL — ABNORMAL LOW (ref 13.0–17.0)
MCH: 26 pg (ref 26.0–34.0)
MCHC: 32.2 g/dL (ref 30.0–36.0)
MCV: 80.6 fL (ref 80.0–100.0)
Platelets: 326 10*3/uL (ref 150–400)
RBC: 4.12 MIL/uL — ABNORMAL LOW (ref 4.22–5.81)
RDW: 15.9 % — ABNORMAL HIGH (ref 11.5–15.5)
WBC: 7.8 10*3/uL (ref 4.0–10.5)
nRBC: 0 % (ref 0.0–0.2)

## 2022-05-06 LAB — PROCALCITONIN: Procalcitonin: 0.68 ng/mL

## 2022-05-06 LAB — PHOSPHORUS
Phosphorus: 3.8 mg/dL (ref 2.5–4.6)
Phosphorus: 3.4 mg/dL (ref 2.5–4.6)

## 2022-05-06 MED ORDER — HYDROCODONE-ACETAMINOPHEN 5-325 MG PO TABS
1.0000 | ORAL_TABLET | ORAL | Status: DC | PRN
  Administered 2022-05-06 – 2022-05-07 (×2): 1 via ORAL
  Filled 2022-05-06 (×2): qty 1

## 2022-05-06 MED ORDER — PROSOURCE TF PO LIQD
90.0000 mL | Freq: Three times a day (TID) | ORAL | Status: DC
  Administered 2022-05-06 – 2022-05-18 (×29): 90 mL
  Filled 2022-05-06 (×38): qty 90

## 2022-05-06 MED ORDER — CHLORHEXIDINE GLUCONATE CLOTH 2 % EX PADS
6.0000 | MEDICATED_PAD | Freq: Every day | CUTANEOUS | Status: DC
  Administered 2022-05-06 – 2022-05-20 (×14): 6 via TOPICAL

## 2022-05-06 MED ORDER — VITAL 1.5 CAL PO LIQD
1000.0000 mL | ORAL | Status: DC
Start: 2022-05-06 — End: 2022-05-15
  Administered 2022-05-06 – 2022-05-14 (×11): 1000 mL
  Filled 2022-05-06 (×10): qty 1000

## 2022-05-06 MED ORDER — VITAL HIGH PROTEIN PO LIQD
1000.0000 mL | ORAL | Status: DC
  Filled 2022-05-06: qty 1000

## 2022-05-06 MED ORDER — SODIUM CHLORIDE 0.9% FLUSH: 10.0000 mL | INTRAVENOUS | Status: DC | PRN

## 2022-05-06 MED ORDER — SODIUM CHLORIDE 0.9% FLUSH
10.0000 mL | Freq: Two times a day (BID) | INTRAVENOUS | Status: DC
  Administered 2022-05-06 – 2022-05-15 (×15): 10 mL
  Administered 2022-05-16: 20 mL
  Administered 2022-05-16 – 2022-05-19 (×6): 10 mL
  Administered 2022-05-20 (×2): 20 mL

## 2022-05-06 NOTE — Progress Notes (Signed)
Peripherally Inserted Central Catheter Placement  The IV Nurse has discussed with the patient and/or persons authorized to consent for the patient, the purpose of this procedure and the potential benefits and risks involved with this procedure.  The benefits include less needle sticks, lab draws from the catheter, and the patient may be discharged home with the catheter. Risks include, but not limited to, infection, bleeding, blood clot (thrombus formation), and puncture of an artery; nerve damage and irregular heartbeat and possibility to perform a PICC exchange if needed/ordered by physician.  Alternatives to this procedure were also discussed.  Bard Power PICC patient education guide, fact sheet on infection prevention and patient information card has been provided to patient /or left at bedside.    PICC Placement Documentation  PICC Double Lumen 15/40/08 Right Basilic 39 cm (Active)  Indication for Insertion or Continuance of Line Vasoactive infusions 05/06/22 1700  Exposed Catheter (cm) 0 cm 05/06/22 1700  Site Assessment Clean, Dry, Intact 05/06/22 1700  Lumen #1 Status Flushed;Blood return noted;Saline locked 05/06/22 1700  Lumen #2 Status Flushed;Blood return noted;Saline locked 05/06/22 1700  Dressing Type Transparent;Securing device 05/06/22 1700  Dressing Status Clean, Dry, Intact;Antimicrobial disc in place 05/06/22 1700  Dressing Change Due 05/13/22 05/06/22 1700       Jule Economy Horton 05/06/2022, 5:19 PM

## 2022-05-06 NOTE — Progress Notes (Signed)
Initial Nutrition Assessment  DOCUMENTATION CODES:   Severe malnutrition in context of chronic illness  INTERVENTION:   Monitor magnesium, potassium, and phosphorus BID for at least 3 days, MD to replete as needed, as pt is at risk for refeeding syndrome.  -Vital 1.5 @ 20 ml/hr via J-tube x 24 hours -90 ml Prosource TF TID -Provides 960 kcals, 98g protein and 366 ml H2O  Goal rate for TF: -Vital 1.5 @ 55 ml/hr via J-tube x 24 hours -45 ml Prosource TF BID -Provides 2060 kcals, 111g protein and 1008 ml H2O  -Once IVF are d/c, free water recommended of 100 ml every 3 hours (800 ml)  NUTRITION DIAGNOSIS:   Severe Malnutrition related to chronic illness, cancer and cancer related treatments as evidenced by energy intake < or equal to 75% for > or equal to 1 month, percent weight loss, moderate fat depletion, mild fat depletion, moderate muscle depletion.  GOAL:   Patient will meet greater than or equal to 90% of their needs  MONITOR:   Labs, Weight trends, TF tolerance, I & O's  REASON FOR ASSESSMENT:   Consult, Malnutrition Screening Tool Enteral/tube feeding initiation and management  ASSESSMENT:   66 y.o. male with medical history significant of CAD s/p stent, HTN, HLD, DM2. Pt with recent history of stomach cancer s/p gastric resection and feeding tube.  On chemo therapy due to positive nodes.  Presented with 1 month history of nausea and vomiting with abdominal pain.  Progressed to intractable nausea and vomiting.  Was hospitalized for further management.  CT scan raised concern for hepatic metastases.  Patient in room with wife and children at bedside. Pt reports some pain with his feed lately. Per wife, pt has not been able to tolerate feeds for long periods of time, has to be cut off every hour. At most the highest rate he has tolerated was 40 ml/hr. Goal is 80 ml/hr x 16 hours but do not think pt can tolerate this amount of volume currently.  Pt has been working with  [facility] RD, last spoke with patient on 3/24. At that time, pt was having trouble advancing tube feeds and getting enough free water.   Pt has not met nutritional needs since J-tube was placed (since admission in March 2023).  Will restart Vital 1.5 @ 20 ml/hr for the next 24 hours to assess tolerance. Will slowly advance. Not sure if pt will be able to tolerate volumes needs for 16 hour feeds. Will continue 24 hour feeds now. Increased Prosource TF to help meet protein needs.  Per oncology note, pt and family have decided to trial a different chemotherapy, starting tomorrow.   Per weight records, pt has lost 39 lbs since 3/11 (18% wt loss x 2.5 months, significant for time frame). Likely from not ever reaching TF goal.   Medications: Lactated ringers  Labs reviewed:  CBGs: 82-91  NUTRITION - FOCUSED PHYSICAL EXAM:  Flowsheet Row Most Recent Value  Orbital Region Mild depletion  Upper Arm Region Moderate depletion  Thoracic and Lumbar Region Mild depletion  Buccal Region Mild depletion  Temple Region Moderate depletion  Clavicle Bone Region Moderate depletion  Clavicle and Acromion Bone Region Moderate depletion  Scapular Bone Region Mild depletion  Dorsal Hand Mild depletion  Patellar Region Moderate depletion  Anterior Thigh Region Moderate depletion  Posterior Calf Region Mild depletion  Edema (RD Assessment) None  Hair Reviewed  Eyes Reviewed  Mouth Reviewed  Skin Reviewed  Diet Order:   Diet Order             Diet clear liquid Room service appropriate? Yes; Fluid consistency: Thin  Diet effective now                   EDUCATION NEEDS:   Education needs have been addressed  Skin:  Skin Assessment: Reviewed RN Assessment  Last BM:  5/24  Height:   Ht Readings from Last 1 Encounters:  05/01/2022 6' (1.829 m)    Weight:   Wt Readings from Last 1 Encounters:  04/23/2022 77.1 kg   BMI:  Body mass index is 23.06 kg/m.  Estimated  Nutritional Needs:   Kcal:  2200-2400  Protein:  110-120g  Fluid:  2.2L/day  Clayton Bibles, MS, RD, LDN Inpatient Clinical Dietitian Contact information available via Amion

## 2022-05-06 NOTE — Progress Notes (Signed)
PHARMACY NOTE -  [facility] has been assisting with dosing of Zosyn for IAI. Dosage remains stable at 3.375g IV q8 hr and further renal adjustments per institutional Pharmacy antibiotic protocol  Pharmacy will sign off, following peripherally for culture results or dose adjustments. Please reconsult if a change in clinical status warrants re-evaluation of dosage.  Reuel Boom, PharmD, BCPS [phone] 05/06/2022, 8:12 AM

## 2022-05-06 NOTE — Progress Notes (Signed)
DISCONTINUE OFF PATHWAY REGIMEN - Gastroesophageal   [id number]:Carboplatin + Paclitaxel (2/50) + RT weekly x 6 weeks:   Administer weekly during RT:     Paclitaxel      Carboplatin   **Always confirm dose/schedule in your pharmacy ordering system**  REASON: Disease Progression PRIOR TREATMENT: Off Pathway: Carboplatin + Paclitaxel (2/50) + RT weekly x 6 weeks TREATMENT RESPONSE: Unable to Evaluate  START ON PATHWAY REGIMEN - Gastroesophageal     Pembrolizumab: A cycle is every 21 days:     Pembrolizumab    Chemotherapy/trastuzumab cycle 1: A cycle is 14 days:     Trastuzumab-xxxx      Oxaliplatin      Leucovorin      Fluorouracil      Fluorouracil    Chemotherapy/trastuzumab cycles 2 and beyond: A cycle is every 14 days:     Trastuzumab-xxxx      Oxaliplatin      Leucovorin      Fluorouracil      Fluorouracil   **Always confirm dose/schedule in your pharmacy ordering system**  Patient Characteristics: Distant Metastases (cM1/pM1) / Locally Recurrent Disease, Adenocarcinoma - Esophageal, GE Junction, and Gastric, First Line, HER2 Positive Therapeutic Status: Distant Metastases (No Additional Staging) Histology: Adenocarcinoma Disease Classification: Gastric Line of Therapy: First Line HER2 Status: Positive Intent of Therapy: Non-Curative / Palliative Intent, Discussed with Patient

## 2022-05-06 NOTE — Progress Notes (Signed)
[facility] PROGRESS NOTE   Zachary Bowers NWG:[id number] DOB: 1956/04/27 DOA: 05/04/2022  PCP: Shirline Frees, MD  Brief History/Interval Summary:  66 y.o. male with medical history significant of CAD s/p stent, HTN, HLD, DM2. Pt with recent history of stomach cancer s/p gastric resection and feeding tube.  On chemo therapy due to positive nodes.  Presented with 1 month history of nausea and vomiting with abdominal pain.  Progressed to intractable nausea and vomiting.  Was hospitalized for further management.  CT scan raised concern for hepatic metastases.  Consultants: Medical oncology.  Interventional radiology  Procedures: None yet    Subjective/Interval History: Patient complains of 4 out of 10 abdominal pain in the upper abdomen.  Continues to have some nausea but no vomiting since yesterday.  Denies any fever or chills.  His wife is at the bedside.    Assessment/Plan:  Intractable nausea vomiting in the setting of gastric cancer CT scan did not show any bowel obstruction.  He has jejunostomy tube which appears to be in place as per CT scan.  His nausea and vomiting could be from his new metastatic process noted on the CT imaging study. Continue symptomatic treatment for now.  Pain control.  Await oncology input. Patient noted to be on Zosyn.  WBC noted to be normal.  We will check a procalcitonin.  Do not see any obvious source of infection.  His abnormal LFTs most likely due to hepatic metastases rather than process such as cholangitis.  If procalcitonin is unremarkable we will go ahead and discontinue his antibiotics.  Gastric cancer with concern for hepatic metastases The lesions in the liver are new compared to previous imaging study.  Patient followed by Dr. Benay Spice with medical oncology.  Await his input. Looks like patient has received multiple cycles of Taxol/carboplatin.  At last oncology visit consideration was being given to adjuvant immunotherapy though he  was thought not to be a good candidate for same due to his physical deconditioning and weight loss.  Status post jejunostomy No obstruction noted on imaging studies.  Could resume his tube feedings severe trickle feed.  Will consult nutritionist.  Transaminitis Likely secondary to metastases in the liver noted on imaging studies.  Bilirubin is mildly elevated.  Images were reviewed by gastroenterology.  No role for biliary stenting at this time.  Diabetes mellitus type 2 Monitor CBGs.  Normocytic anemia Likely due to malignancy.  No evidence of overt bleeding.  Essential hypertension Continue amlodipine.  Also noted to be on Avapro.  Hypothyroidism Continue levothyroxine.  Hyperlipidemia Continue statin.   DVT Prophylaxis: Lovenox Code Status: Full code Family Communication: Discussed with the patient and his wife Disposition Plan: Hopefully return home in improved  Status is: Observation The patient will require care spanning > 2 midnights and should be moved to inpatient because: Continued symptoms of pain and nausea.      Medications: Scheduled:  amLODipine  10 mg Per Tube Daily   aspirin  81 mg Per Tube Daily   atorvastatin  80 mg Per Tube Daily   enoxaparin (LOVENOX) injection  40 mg Subcutaneous Q24H   insulin aspart  0-9 Units Subcutaneous Q4H   irbesartan  150 mg Oral Daily   levothyroxine  125 mcg Per Tube QAC breakfast   Continuous:  lactated ringers 125 mL/hr at 05/06/22 0011   piperacillin-tazobactam (ZOSYN)  IV 3.375 g (05/06/22 0507)   VHQ:[id number], morphine injection, ondansetron **OR** ondansetron (ZOFRAN) IV  Antibiotics: Anti-infectives (From admission, onward)  Start     Dose/Rate Route Frequency Ordered Stop   04/19/2022 2015  piperacillin-tazobactam (ZOSYN) IVPB 3.375 g        3.375 g 12.5 mL/hr over 240 Minutes Intravenous Every 8 hours 04/23/2022 2013         Objective:  Vital Signs  Vitals:   05/06/22 0202  05/06/22 0613 05/06/22 0829 05/06/22 0937  BP: 137/82 136/85 138/82 122/78  Pulse: 86 89  83  Resp: 18     Temp: 97.7 F (36.5 C) 98 F (36.7 C)  98.1 F (36.7 C)  TempSrc: Oral Oral  Oral  SpO2: 96% 97%  96%  Weight:      Height:        Intake/Output Summary (Last 24 hours) at 05/06/2022 1006 Last data filed at 05/06/2022 0300 Gross per 24 hour  Intake 1321.1 ml  Output --  Net 1321.1 ml   Filed Weights   04/27/2022 1618  Weight: 77.1 kg    General appearance: Awake alert.  In no distress Resp: Clear to auscultation bilaterally.  Normal effort Cardio: S1-S2 is normal regular.  No S3-S4.  No rubs murmurs or bruit GI: Abdomen is soft.  Jejunostomy tube is noted.  Abdomen is tender in the epigastric area without any rebound acidity or guarding.  No masses or organomegaly noted. Extremities: No edema.  Full range of motion of lower extremities. Neurologic: Alert and oriented x3.  No focal neurological deficits.    Lab Results:  Data Reviewed: I have personally reviewed following labs and reports of the imaging studies  CBC: Recent Labs  Lab 04/26/2022 1434 04/20/2022 1838 05/06/22 0542  WBC 7.2 7.9 7.8  NEUTROABS 5.4 6.7  --   HGB 11.8* 11.5* 10.7*  HCT 38.0* 36.6* 33.2*  MCV 79.8* 80.1 80.6  PLT 488* 459* 914    Basic Metabolic Panel: Recent Labs  Lab 05/08/2022 1434 05/12/2022 1838 05/06/22 0636  NA 135 134* 138  K 4.1 4.8 4.2  CL 96* 94* 100  CO2 '28 29 27  '$ GLUCOSE 123* 124* 90  BUN '21 23 23  '$ CREATININE 0.89 1.01 1.07  CALCIUM 10.1 9.9 9.4    GFR: Estimated Creatinine Clearance: 74.1 mL/min (by C-G formula based on SCr of 1.07 mg/dL).  Liver Function Tests: Recent Labs  Lab 04/19/2022 1434 05/02/2022 1838 05/06/22 0636  AST 491* 513* 520*  ALT 151* 162* 156*  ALKPHOS 810* 794* 659*  BILITOT 2.6* 2.4* 2.1*  PROT 8.3* 8.3* 7.3  ALBUMIN 3.9 3.8 3.0*    Recent Labs  Lab 04/14/2022 1838  LIPASE 57*   Recent Labs  Lab 05/11/2022 2213  AMMONIA 25     Coagulation Profile: Recent Labs  Lab 04/13/2022 1838  INR 1.0    CBG: Recent Labs  Lab 05/06/22 0006 05/06/22 0410 05/06/22 0729  GLUCAP 103* 82 90     Radiology Studies: CT ABDOMEN PELVIS W CONTRAST  Result Date: 04/12/2022 CLINICAL DATA:  Weakness, elevated LFTs, history of gastric cancer status post gastrectomy EXAM: CT ABDOMEN AND PELVIS WITH CONTRAST TECHNIQUE: Multidetector CT imaging of the abdomen and pelvis was performed using the standard protocol following bolus administration of intravenous contrast. RADIATION DOSE REDUCTION: This exam was performed according to the departmental dose-optimization program which includes automated exposure control, adjustment of the mA and/or kV according to patient size and/or use of iterative reconstruction technique. CONTRAST:  4m OMNIPAQUE IOHEXOL 300 MG/ML  SOLN COMPARISON:  PET-CT dated 01/18/2022 FINDINGS: Lower chest: Trace bilateral pleural effusions, right  greater than left. Associated right lower lobe atelectasis. Hepatobiliary: Innumerable hepatic metastases in both lobes, new. Dominant aggregate lesion measures 8.8 cm in the lateral segment left hepatic lobe (series 2/image 25). Index lesion in the inferior right hepatic lobe measures 4.8 cm (series 2/image 50). Gallbladder is notable for layering sludge (series 2/image 52), without associated inflammatory changes. No intrahepatic or extrahepatic duct dilatation. Pancreas: Within normal limits. Spleen: Within normal limits. Adrenals/Urinary Tract: Adrenal glands are within normal limits. Kidneys are within normal limits.  No hydronephrosis. Bladder is mildly thick-walled although underdistended. Stomach/Bowel: Status post gastrectomy. Percutaneous jejunostomy.  No evidence of bowel obstruction. Appendix is not discretely visualized, reportedly surgically absent. No colonic wall thickening or mass is evident on CT. Vascular/Lymphatic: No evidence of abdominal aortic aneurysm. 14 mm  short axis node in the portacaval region (series 2/image 33). Reproductive: Prostate is unremarkable. Other: Moderate abdominopelvic ascites, minimally complex. Musculoskeletal: Mild degenerative changes of the lumbar spine. IMPRESSION: Status post gastrectomy with percutaneous jejunostomy. Innumerable hepatic metastases, new, with index lesions as above. 14 mm short axis portacaval node, suspicious for nodal metastasis. Moderate abdominopelvic ascites. Trace bilateral pleural effusions. Electronically Signed   By: Julian Hy M.D.   On: 05/04/2022 19:28       LOS: 0 days   [facility] Pager on [email or web address]  05/06/2022, 10:06 AM

## 2022-05-06 NOTE — Progress Notes (Addendum)
HEMATOLOGY-ONCOLOGY PROGRESS NOTE  ASSESSMENT AND PLAN: Gastric cancer CT abdomen/pelvis 10/08/2021-probable mass in the proximal stomach, enlarged gastropathic ligament node Upper endoscopy 10/01/2021-gastric cardia/fundus mass with oozing and stigmata of recent bleeding-biopsy at least intramucosal adenocarcinoma, mismatch repair protein expression intact, PD-L1 combined positive score 7%, HER2 positive PET 10/20/2021-hypermetabolic proximal gastric primary, or hypermetabolic gastropathic ligament node, right paramidline prostatic hypermetabolism, anal hypermetabolism without a CT mass Radiation 11/11/2021-12/22/2021 Cycle 1  Taxol/carboplatin 11/12/2021 Cycle 2 Taxol/carboplatin 11/19/2021 Cycle 3 Taxol/carboplatin 11/26/2021 Cycle 4 Taxol/carboplatin 12/03/2021 Cycle 5 Taxol/carboplatin 12/18/2021 PET 01/19/2022-decreased hypermetabolism at the gastric cardia, decreased size and metabolic activity associated with an enlarged gastrohepatic node, persistent anal hypermetabolism, stable lateral left lower lobe nodule, possible new more medial subpleural left lower lobe nodule-both nodules below PET resolution 02/11/2022-total gastrectomy with Roux-en-Y esophagojejunostomy, placement of jejunal feeding tube,ypT2,ypN1 moderate to poorly differentiated adenocarcinoma at the GE junction measuring 2.5 x 2.3 cm, lymphovascular invasion present, negative resection margins, 2/28 lymph nodes CT abdomen/pelvis with contrast 04/24/2022-innumerable hepatic metastases which are new, portacaval node suspicious for nodal metastasis.   Microcytic anemia secondary to #1 Diabetes Hypertension Coronary artery disease, cardiac catheterization 08/11/2021-placement of proximal and distal right coronary artery stents Hypothyroidism Sleep apnea Anal hypermetabolism on the PET 10/20/2021-no mass identified Hospital admission 05/01/2022-nausea and vomiting, transaminitis.  Mr. Villella was admitted to the hospital due to  abdominal pain with nausea and vomiting.  He was found to have abnormal liver function tests in our office yesterday.  Imaging showed evidence of metastatic disease in the liver.  CT images were discussed and reviewed with the patient and his wife this morning.  I also discussed the findings with his children later this morning.  We discussed that findings on imaging show metastatic disease.  We further discussed that no treatment would be curative.  Discussed a trial of chemotherapy with FOLFOX and Herceptin versus palliative care/comfort measures.  The patient and his family had some time to think about this and would like to proceed with a trial of systemic chemotherapy.  Adverse effects of this treatment have been discussed with the patient and his family including but not limited to alopecia, myelosuppression, nausea, vomiting, diarrhea, peripheral neuropathy, renal insufficiency, and risk of infusion reaction with oxaliplatin.  We also discussed the risk of cardiotoxicity due to Herceptin.  He had an echocardiogram performed on 08/19/2021 with LVEF of 60 to 65%.  This will serve as baseline.  Will need PICC line placed for chemotherapy which we will plan to initiate on 5/26.  He will need a Port-A-Cath at some point in the future.  For pain, will add hydrocodone 5/325 1 tablet every 4 hours as needed for pain.  Recommend dietitian consult to initiate J-tube feedings.  Recommendations: 1.  PICC line placement for chemotherapy.  Will need a port in the future. 2.  We will plan for FOLFOX/Herceptin on 5/26. 3.  Hydrocodone 5/325 mg 1 tablet every 4 hours as needed for pain. 4.  Dietitian consult for J-tube feeding.  [name], NP   Mr. Minichiello was interviewed and examined.  He was admitted yesterday with failure to thrive and palpable hepatomegaly.  I reviewed the CT findings with Mr. Nephew.  I reviewed the images with his wife.  The CT is consistent with metastatic gastric cancer involving  the liver.  There are extensive liver metastases.  No therapy will be curative.  We discussed treatment options including comfort care versus a trial of salvage systemic therapy.  He has a poor performance  status which places him at increased risk for toxicity with systemic therapy.  I feel he should be treated as an inpatient for cycle 1 if he decides to proceed with systemic therapy.  I recommend FOLFOX/Herceptin.  Pembrolizumab can be added with cycle 2.  He indicated he would like to proceed with treatment as opposed to comfort care.  I will discuss details of the FOLFOX/Herceptin regimen with him in the morning.  A chemotherapy plan was entered with the plan to begin treatment on 05/07/2022.  We will work on his nutritional status and pain control while in the hospital.  I was present for greater than 50% today's visit.  I performed medical decision making.    SUBJECTIVE: Mr. Eoff is followed by our office for gastroesophageal cancer.  He had labs performed in our office yesterday which showed markedly elevated liver enzymes.  He was referred to the emergency department for further evaluation and admission.  CT abdomen/pelvis was performed which showed innumerable hepatic metastases which were new as well as a portacaval node suspicious for metastasis.  Today, pain is uncontrolled.  The patient's wife and children are at the bedside today.  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[place])    10/28/2021 Cancer Staging   Staging form: Stomach, AJCC 8th Edition - Clinical: Stage Unknown (cTX, cN1, cM0) - Signed by Ladell Pier, MD on 10/28/2021    11/12/2021 - 12/18/2021 Chemotherapy   Patient is on Treatment Plan : ESOPHAGUS Carboplatin/PACLitaxel weekly x 6 weeks with XRT        Gastric cancer ([place])  02/11/2022 Initial Diagnosis   Gastric cancer ([place])    03/04/2022 Cancer Staging    Staging form: Stomach, AJCC 8th Edition - Pathologic: Stage IIA (pT2, pN1, cM0) - Signed by Ladell Pier, MD on 03/04/2022 Histologic grade (G): G2 Histologic grading system: 3 grade system Residual tumor (R): R0 - None    04/02/2022 -  Chemotherapy   Patient is on Treatment Plan : HEAD/NECK Nivolumab q14d        PHYSICAL EXAMINATION:  Vitals:   05/06/22 0202 05/06/22 0613  BP: 137/82 136/85  Pulse: 86 89  Resp: 18   Temp: 97.7 F (36.5 C) 98 F (36.7 C)  SpO2: 96% 97%   Filed Weights   04/12/2022 1618  Weight: 77.1 kg    Intake/Output from previous day: 05/24 0701 - 05/25 0700 In: 1321.1 [I.V.:250; IV Piggyback:1071.1] Out: -   Physical Exam Vitals reviewed.  Constitutional:      General: He is not in acute distress. HENT:     Head: Normocephalic.     Mouth/Throat:     Pharynx: No oropharyngeal exudate or posterior oropharyngeal erythema.  Cardiovascular:     Rate and Rhythm: Normal rate.  Pulmonary:     Effort: Pulmonary effort is normal. No respiratory distress.  Abdominal:     Palpations: Abdomen is soft.     Comments: J-tube in place.  Liver palpable.  Skin:    General: Skin is warm and dry.  Neurological:     Mental Status: He is alert and oriented to person, place, and time.    LABORATORY DATA:  I have reviewed the data as listed    Latest Ref Rng & Units 05/06/2022    6:36 AM 05/08/2022    6:38 PM 04/26/2022    2:34 PM  CMP  Glucose 70 - 99 mg/dL  90   124   123    BUN 8 - 23 mg/dL _0 Creatinine 0.61 - 1.24 mg/dL 1.07   1.01   0.89    Sodium 135 - 145 mmol/L 138   134   135    Potassium 3.5 - 5.1 mmol/L 4.2   4.8   4.1    Chloride 98 - 111 mmol/L 100   94   96    CO2 22 - 32 mmol/L _1 Calcium 8.9 - 10.3 mg/dL 9.4   9.9   10.1    Total Protein 6.5 - 8.1 g/dL 7.3   8.3   8.3    Total Bilirubin 0.3 - 1.2 mg/dL 2.1   2.4   2.6    Alkaline Phos 38 - 126 U/L 659   794   810    AST 15 - 41 U/L 520   513   491    ALT 0  - 44 U/L 156   162   151      Lab Results  Component Value Date   WBC 7.8 05/06/2022   HGB 10.7 (L) 05/06/2022   HCT 33.2 (L) 05/06/2022   MCV 80.6 05/06/2022   PLT 326 05/06/2022   NEUTROABS 6.7 05/04/2022    Lab Results  Component Value Date   CEA 1.50 10/30/2021   PSA1 2.7 10/30/2021    CT ABDOMEN PELVIS W CONTRAST  Result Date: 04/26/2022 CLINICAL DATA:  Weakness, elevated LFTs, history of gastric cancer status post gastrectomy EXAM: CT ABDOMEN AND PELVIS WITH CONTRAST TECHNIQUE: Multidetector CT imaging of the abdomen and pelvis was performed using the standard protocol following bolus administration of intravenous contrast. RADIATION DOSE REDUCTION: This exam was performed according to the departmental dose-optimization program which includes automated exposure control, adjustment of the mA and/or kV according to patient size and/or use of iterative reconstruction technique. CONTRAST:  20m OMNIPAQUE IOHEXOL 300 MG/ML  SOLN COMPARISON:  PET-CT dated 01/18/2022 FINDINGS: Lower chest: Trace bilateral pleural effusions, right greater than left. Associated right lower lobe atelectasis. Hepatobiliary: Innumerable hepatic metastases in both lobes, new. Dominant aggregate lesion measures 8.8 cm in the lateral segment left hepatic lobe (series 2/image 25). Index lesion in the inferior right hepatic lobe measures 4.8 cm (series 2/image 50). Gallbladder is notable for layering sludge (series 2/image 52), without associated inflammatory changes. No intrahepatic or extrahepatic duct dilatation. Pancreas: Within normal limits. Spleen: Within normal limits. Adrenals/Urinary Tract: Adrenal glands are within normal limits. Kidneys are within normal limits.  No hydronephrosis. Bladder is mildly thick-walled although underdistended. Stomach/Bowel: Status post gastrectomy. Percutaneous jejunostomy.  No evidence of bowel obstruction. Appendix is not discretely visualized, reportedly surgically absent. No  colonic wall thickening or mass is evident on CT. Vascular/Lymphatic: No evidence of abdominal aortic aneurysm. 14 mm short axis node in the portacaval region (series 2/image 33). Reproductive: Prostate is unremarkable. Other: Moderate abdominopelvic ascites, minimally complex. Musculoskeletal: Mild degenerative changes of the lumbar spine. IMPRESSION: Status post gastrectomy with percutaneous jejunostomy. Innumerable hepatic metastases, new, with index lesions as above. 14 mm short axis portacaval node, suspicious for nodal metastasis. Moderate abdominopelvic ascites. Trace bilateral pleural effusions. Electronically Signed   By: SJulian HyM.D.   On: 04/27/2022 19:28     Future Appointments  Date Time Provider DTres Pinos 05/07/2022  1:15 PM TOwens Shark NP [facility] None  LOS: 0 days

## 2022-05-07 ENCOUNTER — Ambulatory Visit: Payer: Medicare PPO | Admitting: Nurse Practitioner

## 2022-05-07 DIAGNOSIS — E43 Unspecified severe protein-calorie malnutrition: Secondary | ICD-10-CM | POA: Insufficient documentation

## 2022-05-07 DIAGNOSIS — C160 Malignant neoplasm of cardia: Secondary | ICD-10-CM | POA: Diagnosis not present

## 2022-05-07 DIAGNOSIS — D649 Anemia, unspecified: Secondary | ICD-10-CM

## 2022-05-07 DIAGNOSIS — R112 Nausea with vomiting, unspecified: Secondary | ICD-10-CM | POA: Diagnosis not present

## 2022-05-07 DIAGNOSIS — I10 Essential (primary) hypertension: Secondary | ICD-10-CM | POA: Diagnosis not present

## 2022-05-07 DIAGNOSIS — R7401 Elevation of levels of liver transaminase levels: Secondary | ICD-10-CM | POA: Diagnosis not present

## 2022-05-07 LAB — CBC
HCT: 29.6 % — ABNORMAL LOW (ref 39.0–52.0)
Hemoglobin: 9.6 g/dL — ABNORMAL LOW (ref 13.0–17.0)
MCH: 26.4 pg (ref 26.0–34.0)
MCHC: 32.4 g/dL (ref 30.0–36.0)
MCV: 81.3 fL (ref 80.0–100.0)
Platelets: 368 10*3/uL (ref 150–400)
RBC: 3.64 MIL/uL — ABNORMAL LOW (ref 4.22–5.81)
RDW: 16.1 % — ABNORMAL HIGH (ref 11.5–15.5)
WBC: 7.7 10*3/uL (ref 4.0–10.5)
nRBC: 0 % (ref 0.0–0.2)

## 2022-05-07 LAB — GLUCOSE, CAPILLARY
Glucose-Capillary: 101 mg/dL — ABNORMAL HIGH (ref 70–99)
Glucose-Capillary: 102 mg/dL — ABNORMAL HIGH (ref 70–99)
Glucose-Capillary: 109 mg/dL — ABNORMAL HIGH (ref 70–99)
Glucose-Capillary: 154 mg/dL — ABNORMAL HIGH (ref 70–99)
Glucose-Capillary: 215 mg/dL — ABNORMAL HIGH (ref 70–99)
Glucose-Capillary: 287 mg/dL — ABNORMAL HIGH (ref 70–99)

## 2022-05-07 LAB — COMPREHENSIVE METABOLIC PANEL
ALT: 185 U/L — ABNORMAL HIGH (ref 0–44)
AST: 610 U/L — ABNORMAL HIGH (ref 15–41)
Albumin: 2.7 g/dL — ABNORMAL LOW (ref 3.5–5.0)
Alkaline Phosphatase: 597 U/L — ABNORMAL HIGH (ref 38–126)
Anion gap: 10 (ref 5–15)
BUN: 24 mg/dL — ABNORMAL HIGH (ref 8–23)
CO2: 25 mmol/L (ref 22–32)
Calcium: 8.8 mg/dL — ABNORMAL LOW (ref 8.9–10.3)
Chloride: 101 mmol/L (ref 98–111)
Creatinine, Ser: 0.99 mg/dL (ref 0.61–1.24)
GFR, Estimated: >60 mL/min (ref >60)
Glucose, Bld: 106 mg/dL — ABNORMAL HIGH (ref 70–99)
Potassium: 4.1 mmol/L (ref 3.5–5.1)
Sodium: 136 mmol/L (ref 135–145)
Total Bilirubin: 2.5 mg/dL — ABNORMAL HIGH (ref 0.3–1.2)
Total Protein: 6.5 g/dL (ref 6.5–8.1)

## 2022-05-07 LAB — PHOSPHORUS
Phosphorus: 3.4 mg/dL (ref 2.5–4.6)
Phosphorus: 2.9 mg/dL (ref 2.5–4.6)

## 2022-05-07 LAB — MAGNESIUM
Magnesium: 2.3 mg/dL (ref 1.7–2.4)
Magnesium: 1.8 mg/dL (ref 1.7–2.4)

## 2022-05-07 LAB — PROCALCITONIN: Procalcitonin: 1.05 ng/mL

## 2022-05-07 MED ORDER — ALTEPLASE 2 MG IJ SOLR: 2.0000 mg | Freq: Once | INTRAMUSCULAR | Status: DC | PRN

## 2022-05-07 MED ORDER — SODIUM CHLORIDE 0.9% FLUSH: 3.0000 mL | INTRAVENOUS | Status: DC | PRN

## 2022-05-07 MED ORDER — DIPHENHYDRAMINE HCL 25 MG PO CAPS
25.0000 mg | ORAL_CAPSULE | Freq: Once | ORAL | Status: AC
  Administered 2022-05-07: 25 mg via ORAL
  Filled 2022-05-07: qty 1

## 2022-05-07 MED ORDER — TRASTUZUMAB-DKST CHEMO 150 MG IV SOLR
450.0000 mg | Freq: Once | INTRAVENOUS | Status: AC
  Administered 2022-05-07: 450 mg via INTRAVENOUS
  Filled 2022-05-07: qty 21.43

## 2022-05-07 MED ORDER — DIPHENHYDRAMINE HCL 50 MG/ML IJ SOLN: 50.0000 mg | Freq: Once | INTRAMUSCULAR | Status: DC | PRN

## 2022-05-07 MED ORDER — HEPARIN SOD (PORK) LOCK FLUSH 100 UNIT/ML IV SOLN: 500.0000 [IU] | Freq: Once | INTRAVENOUS | Status: DC | PRN

## 2022-05-07 MED ORDER — PIPERACILLIN-TAZOBACTAM 3.375 G IVPB 30 MIN
3.3750 g | Freq: Once | INTRAVENOUS | Status: AC
  Administered 2022-05-07: 3.375 g via INTRAVENOUS
  Filled 2022-05-07 (×2): qty 50

## 2022-05-07 MED ORDER — FAMOTIDINE IN NACL 20-0.9 MG/50ML-% IV SOLN: 20.0000 mg | Freq: Once | INTRAVENOUS | Status: DC | PRN

## 2022-05-07 MED ORDER — COLD PACK MISC ONCOLOGY: 1.0000 | Freq: Once | Status: AC | PRN

## 2022-05-07 MED ORDER — SODIUM CHLORIDE 0.9 % IV SOLN: Freq: Once | INTRAVENOUS | Status: AC

## 2022-05-07 MED ORDER — SODIUM CHLORIDE 0.9 % IV SOLN
1800.0000 mg/m2 | Freq: Once | INTRAVENOUS | Status: AC
  Administered 2022-05-07: 3550 mg via INTRAVENOUS
  Filled 2022-05-07: qty 71

## 2022-05-07 MED ORDER — HOT PACK MISC ONCOLOGY: 1.0000 | Freq: Once | Status: AC | PRN

## 2022-05-07 MED ORDER — DEXTROSE 5 % IV SOLN: Freq: Once | INTRAVENOUS | Status: AC

## 2022-05-07 MED ORDER — SODIUM CHLORIDE 0.9% FLUSH
10.0000 mL | INTRAVENOUS | Status: DC | PRN
  Administered 2022-05-21: 10 mL

## 2022-05-07 MED ORDER — LEUCOVORIN CALCIUM INJECTION 350 MG
200.0000 mg/m2 | Freq: Once | INTRAVENOUS | Status: AC
  Administered 2022-05-07: 396 mg via INTRAVENOUS
  Filled 2022-05-07: qty 19.8

## 2022-05-07 MED ORDER — SODIUM CHLORIDE 0.9 % IV SOLN
10.0000 mg | Freq: Once | INTRAVENOUS | Status: AC
  Administered 2022-05-07: 10 mg via INTRAVENOUS
  Filled 2022-05-07: qty 1

## 2022-05-07 MED ORDER — EPINEPHRINE 0.3 MG/0.3ML IJ SOAJ: 0.3000 mg | Freq: Once | INTRAMUSCULAR | Status: DC | PRN

## 2022-05-07 MED ORDER — SODIUM CHLORIDE 0.9 % IV SOLN: Freq: Once | INTRAVENOUS | Status: DC | PRN

## 2022-05-07 MED ORDER — POLYETHYLENE GLYCOL 3350 17 G PO PACK
17.0000 g | PACK | Freq: Every day | ORAL | Status: DC
  Administered 2022-05-07 – 2022-05-09 (×3): 17 g
  Filled 2022-05-07 (×2): qty 1

## 2022-05-07 MED ORDER — HEPARIN SOD (PORK) LOCK FLUSH 100 UNIT/ML IV SOLN: 250.0000 [IU] | Freq: Once | INTRAVENOUS | Status: DC | PRN

## 2022-05-07 MED ORDER — OXALIPLATIN CHEMO INJECTION 100 MG/20ML
85.0000 mg/m2 | Freq: Once | INTRAVENOUS | Status: AC
  Administered 2022-05-07: 170 mg via INTRAVENOUS
  Filled 2022-05-07: qty 34

## 2022-05-07 MED ORDER — METHYLPREDNISOLONE SODIUM SUCC 125 MG IJ SOLR: 125.0000 mg | Freq: Once | INTRAMUSCULAR | Status: DC | PRN

## 2022-05-07 MED ORDER — SENNOSIDES 8.8 MG/5ML PO SYRP
10.0000 mL | ORAL_SOLUTION | Freq: Two times a day (BID) | ORAL | Status: DC
  Administered 2022-05-07 – 2022-05-18 (×18): 10 mL
  Filled 2022-05-07 (×25): qty 10

## 2022-05-07 MED ORDER — SODIUM CHLORIDE 0.9 % IV SOLN: 1800.0000 mg/m2 | INTRAVENOUS | Status: DC

## 2022-05-07 MED ORDER — ACETAMINOPHEN 325 MG PO TABS
650.0000 mg | ORAL_TABLET | Freq: Once | ORAL | Status: AC
  Administered 2022-05-07: 650 mg via ORAL
  Filled 2022-05-07: qty 2

## 2022-05-07 MED ORDER — PALONOSETRON HCL INJECTION 0.25 MG/5ML
0.2500 mg | Freq: Once | INTRAVENOUS | Status: AC
  Administered 2022-05-07: 0.25 mg via INTRAVENOUS
  Filled 2022-05-07: qty 5

## 2022-05-07 MED ORDER — ALBUTEROL SULFATE (2.5 MG/3ML) 0.083% IN NEBU: 2.5000 mg | INHALATION_SOLUTION | Freq: Once | RESPIRATORY_TRACT | Status: DC | PRN

## 2022-05-07 NOTE — Progress Notes (Signed)
Oxaliplatin '170mg'$  in 510m and leucovorin '396mg'$  in 250 mL IV infusion chemo calculations verified by LJoycelyn Das RN and KEarlyne Iba RN. oxaliplatin then co-signed on MAR with KEarlyne Iba RN. Patient tolerated infusions with no adverse effects.

## 2022-05-07 NOTE — Progress Notes (Signed)
Fluorouracil 3,'550mg'$  in 558m IV infusion chemo calculations verified by LJoycelyn Das RN and KEarlyne Iba RN. Fluorouracil then co-signed on MAR with KEarlyne Iba RN. Fluorouracil to infuse over 46 hours. Currently infusing with no adverse effects. Nursing will continue to monitor.

## 2022-05-07 NOTE — Progress Notes (Signed)
Ogivri '450mg'$  IV infusion chemo calculations verified by Joycelyn Das, RN and Earlyne Iba, RN. Ogivri then co-signed on MAR with Gerlene Fee, RN. Patient tolerated infusion with no adverse effects.

## 2022-05-07 NOTE — Progress Notes (Signed)
Subjective: Patient seen today on behalf of Dr. Zenia Resides as she just found out he was in the hospital.  The patient is mostly sleeping and doesn't talk.  Wife states over the last two weeks he has really dwindled with less energy and increasing pain all over.  He has had more nausea.  He has never been able to tolerate his TFs at goal rate as planned for 16hr/day feeds.  She said the max would be 5-6 hours at a time and then stop them for 1-2 hours to allow his bloating to go down and then could resume.  He is now unable to tolerate for that long.  He was brought to the ED several days ago and his LFTs were elevated and a new CT scan that confirmed significant metastatic disease almost essentially replacing his liver.  He has been admitted and plans are to start salvage chemotherapy.  His TFs had to be stopped last night at just 20cc/hr due to his inability to tolerate these secondary to abdominal bloating.  Last BM was on the 24th.  ROS: See above, otherwise other systems negative  Objective: Vital signs in last 24 hours: Temp:  [98.1 F (36.7 C)-99 F (37.2 C)] 98.8 F (37.1 C) (05/26 0410) Pulse Rate:  [102-110] 110 (05/26 0410) Resp:  [18] 18 (05/26 0410) BP: (122-139)/(84-93) 130/84 (05/26 0803) SpO2:  [94 %-96 %] 95 % (05/26 0410) Last BM Date : 04/19/2022  Intake/Output from previous day: 05/25 0701 - 05/26 0700 In: 464 [P.O.:240; IV Piggyback:224] Out: 1650 [Urine:1650] Intake/Output this shift: No intake/output data recorded.  PE: Gen: sleeping, appears worn out Abd: soft, but with some distention, J-tube in place and currently getting some TFs at 20cc/hr.  Diffusely tender, but no peritoneal signs.  Liver edge is palpable.  Lab Results:  Recent Labs    05/06/22 0542 05/07/22 0425  WBC 7.8 7.7  HGB 10.7* 9.6*  HCT 33.2* 29.6*  PLT 326 368   BMET Recent Labs    05/06/22 0636 05/07/22 0425  NA 138 136  K 4.2 4.1  CL 100 101  CO2 27 25  GLUCOSE 90 106*  BUN  23 24*  CREATININE 1.07 0.99  CALCIUM 9.4 8.8*   PT/INR Recent Labs    05/11/2022 1838  LABPROT 13.2  INR 1.0   CMP     Component Value Date/Time   NA 136 05/07/2022 0425   NA 136 08/21/2021 1015   K 4.1 05/07/2022 0425   CL 101 05/07/2022 0425   CO2 25 05/07/2022 0425   GLUCOSE 106 (H) 05/07/2022 0425   BUN 24 (H) 05/07/2022 0425   BUN 23 08/21/2021 1015   CREATININE 0.99 05/07/2022 0425   CREATININE 0.89 04/23/2022 1434   CALCIUM 8.8 (L) 05/07/2022 0425   PROT 6.5 05/07/2022 0425   ALBUMIN 2.7 (L) 05/07/2022 0425   AST 610 (H) 05/07/2022 0425   AST 491 (HH) 05/11/2022 1434   ALT 185 (H) 05/07/2022 0425   ALT 151 (H) 05/10/2022 1434   ALKPHOS 597 (H) 05/07/2022 0425   BILITOT 2.5 (H) 05/07/2022 0425   BILITOT 2.6 (H) 04/26/2022 1434   GFRNONAA >60 05/07/2022 0425   GFRNONAA >60 04/26/2022 1434   Lipase     Component Value Date/Time   LIPASE 57 (H) 05/08/2022 1838       Studies/Results: CT ABDOMEN PELVIS W CONTRAST  Result Date: 05/10/2022 CLINICAL DATA:  Weakness, elevated LFTs, history of gastric cancer status post  gastrectomy EXAM: CT ABDOMEN AND PELVIS WITH CONTRAST TECHNIQUE: Multidetector CT imaging of the abdomen and pelvis was performed using the standard protocol following bolus administration of intravenous contrast. RADIATION DOSE REDUCTION: This exam was performed according to the departmental dose-optimization program which includes automated exposure control, adjustment of the mA and/or kV according to patient size and/or use of iterative reconstruction technique. CONTRAST:  19m OMNIPAQUE IOHEXOL 300 MG/ML  SOLN COMPARISON:  PET-CT dated 01/18/2022 FINDINGS: Lower chest: Trace bilateral pleural effusions, right greater than left. Associated right lower lobe atelectasis. Hepatobiliary: Innumerable hepatic metastases in both lobes, new. Dominant aggregate lesion measures 8.8 cm in the lateral segment left hepatic lobe (series 2/image 25). Index lesion in  the inferior right hepatic lobe measures 4.8 cm (series 2/image 50). Gallbladder is notable for layering sludge (series 2/image 52), without associated inflammatory changes. No intrahepatic or extrahepatic duct dilatation. Pancreas: Within normal limits. Spleen: Within normal limits. Adrenals/Urinary Tract: Adrenal glands are within normal limits. Kidneys are within normal limits.  No hydronephrosis. Bladder is mildly thick-walled although underdistended. Stomach/Bowel: Status post gastrectomy. Percutaneous jejunostomy.  No evidence of bowel obstruction. Appendix is not discretely visualized, reportedly surgically absent. No colonic wall thickening or mass is evident on CT. Vascular/Lymphatic: No evidence of abdominal aortic aneurysm. 14 mm short axis node in the portacaval region (series 2/image 33). Reproductive: Prostate is unremarkable. Other: Moderate abdominopelvic ascites, minimally complex. Musculoskeletal: Mild degenerative changes of the lumbar spine. IMPRESSION: Status post gastrectomy with percutaneous jejunostomy. Innumerable hepatic metastases, new, with index lesions as above. 14 mm short axis portacaval node, suspicious for nodal metastasis. Moderate abdominopelvic ascites. Trace bilateral pleural effusions. Electronically Signed   By: SJulian HyM.D.   On: 04/19/2022 19:28   [place]KG SITE RITE  Result Date: 05/06/2022 If Site Rite image not attached, placement could not be confirmed due to current cardiac rhythm.   Anti-infectives: Anti-infectives (From admission, onward)    Start     Dose/Rate Route Frequency Ordered Stop   05/11/2022 2015  piperacillin-tazobactam (ZOSYN) IVPB 3.375 g        3.375 g 12.5 mL/hr over 240 Minutes Intravenous Every 8 hours 05/08/2022 2013          Assessment/Plan Gastric adenocarcinoma s/p open total gastrectomy with roux-en-y esophagojejunostomy by Dr. AZenia Resideson 02/11/22, now with extensive liver mets -appreciate care by all services -not much to  add surgically, but agree with trialing TFs as he is able to tolerate to give him nutrition while he is undergoing chemotherapy -J-tube is working well -miralax has been ordered to help with bowel regimen -Dr. AZenia Residesto follow up    LOS: 1 day    KHenreitta Cea, PMidmichigan [facility] Center-GratiotSurgery 05/07/2022, 12:28 PM Please see Amion for pager number during day hours 7:00am-4:30pm or 7:00am -11:30am on weekends

## 2022-05-07 NOTE — Progress Notes (Addendum)
HEMATOLOGY-ONCOLOGY PROGRESS NOTE  ASSESSMENT AND PLAN: Gastric cancer CT abdomen/pelvis 10/08/2021-probable mass in the proximal stomach, enlarged gastropathic ligament node Upper endoscopy 10/01/2021-gastric cardia/fundus mass with oozing and stigmata of recent bleeding-biopsy at least intramucosal adenocarcinoma, mismatch repair protein expression intact, PD-L1 combined positive score 7%, HER2 positive PET 10/20/2021-hypermetabolic proximal gastric primary, or hypermetabolic gastropathic ligament node, right paramidline prostatic hypermetabolism, anal hypermetabolism without a CT mass Radiation 11/11/2021-12/22/2021 Cycle 1  Taxol/carboplatin 11/12/2021 Cycle 2 Taxol/carboplatin 11/19/2021 Cycle 3 Taxol/carboplatin 11/26/2021 Cycle 4 Taxol/carboplatin 12/03/2021 Cycle 5 Taxol/carboplatin 12/18/2021 PET 01/19/2022-decreased hypermetabolism at the gastric cardia, decreased size and metabolic activity associated with an enlarged gastrohepatic node, persistent anal hypermetabolism, stable lateral left lower lobe nodule, possible new more medial subpleural left lower lobe nodule-both nodules below PET resolution 02/11/2022-total gastrectomy with Roux-en-Y esophagojejunostomy, placement of jejunal feeding tube,ypT2,ypN1 moderate to poorly differentiated adenocarcinoma at the GE junction measuring 2.5 x 2.3 cm, lymphovascular invasion present, negative resection margins, 2/28 lymph nodes CT abdomen/pelvis with contrast 05/03/2022-innumerable hepatic metastases which are new, portacaval node suspicious for nodal metastasis.   Microcytic anemia secondary to #1 Diabetes Hypertension Coronary artery disease, cardiac catheterization 08/11/2021-placement of proximal and distal right coronary artery stents Hypothyroidism Sleep apnea Anal hypermetabolism on the PET 10/20/2021-no mass identified Hospital admission 04/30/2022-nausea and vomiting, transaminitis.  Zachary Bowers appears unchanged.  LFTs remain elevated.   His imaging showed evidence of metastatic disease in the liver and we discussed proceeding with salvage chemotherapy with FOLFOX and Herceptin versus palliative care/comfort measures.  He is opted to pursue chemotherapy.  PICC line has been placed.  We discussed potential adverse effects of this treatment with the patient including but not limited to alopecia, myelosuppression, nausea, vomiting, diarrhea, mucositis, peripheral neuropathy, renal insufficiency, and risk of infusion reaction with oxaliplatin.  We also discussed the risk of cardiotoxicity due to Herceptin.  He agrees to proceed.  Chemotherapy to begin today.  Continue as needed morphine and hydrocodone for pain.  Discussed nutritional status with the patient.  Recommend for him to resume feedings through his J-tube.  Dietitian following.  Recommendations: 1.  Proceed with FOLFOX/Herceptin today 2.  Continue current pain medication. 3.  Out of bed as tolerated 4.  Resume J-tube feeding.  Dietitian following.  [name], NP   Zachary Bowers was interviewed and examined.  He appears unchanged.  He continues to have right abdominal pain.  He was alert when I saw him early this morning.  We discussed treatment options again today.  We discussed comfort care versus a trial of systemic therapy.  The gastric tumor is HER2 positive.  He indicates that he would like to proceed with treatment.  I recommend FOLFOX/Herceptin.  We will consider adding pembrolizumab as an outpatient.  I reviewed potential toxicities associated with the FOLFOX regimen including the chance of nausea/vomiting, mucositis, diarrhea, alopecia, and hematologic toxicity.  We discussed the rash, sun sensitivity, hyperpigmentation, and hand/foot syndrome associated with 5-fluorouracil.  We discussed the allergic reaction and various types of neuropathy seen with oxaliplatin.  We discussed the cardiac toxicity and allergic reaction associated with Herceptin.  He agrees to  proceed.  The plan is to proceed with cycle 1 FOLFOX today.  I discussed the case with the Cancer center pharmacy.  The 5-FU bolus will be held in the 5-FU infusion will be dose reduced with this cycle.  Oncology will check on him 05/08/2022.  I will see him on 05/10/2022 if he remains in the hospital.  Outpatient follow-up will be scheduled with  Cancer center.  I was present for greater than 50% of today's visit.  I performed medical decision making.  [name], MD    SUBJECTIVE: Zachary Bowers continues to report abdominal discomfort this morning.  He asked for his J-tube feedings to be stopped secondary to discomfort.  PICC line placed yesterday in anticipation of chemotherapy today.  Otherwise, no new complaints  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[place])  10/28/2021 Initial Diagnosis   Malignant neoplasm of cardia of stomach ([place])   10/28/2021 Cancer Staging   Staging form: Stomach, AJCC 8th Edition - Clinical: Stage Unknown (cTX, cN1, cM0) - Signed by Ladell Pier, MD on 10/28/2021   11/12/2021 - 12/18/2021 Chemotherapy   Patient is on Treatment Plan : ESOPHAGUS Carboplatin/PACLitaxel weekly x 6 weeks with XRT       05/07/2022 -  Chemotherapy   Patient is on Treatment Plan : GASTROESOPHAGEAL FOLFOX D1,15 + Trastuzumab (6/4) D1,15 + Pembrolizumab (200) every 3 weeks, q28d     Gastric cancer ([place])  02/11/2022 Initial Diagnosis   Gastric cancer ([place])   03/04/2022 Cancer Staging   Staging form: Stomach, AJCC 8th Edition - Pathologic: Stage IIA (pT2, pN1, cM0) - Signed by Ladell Pier, MD on 03/04/2022 Histologic grade (G): G2 Histologic grading system: 3 grade system Residual tumor (R): R0 - None   04/02/2022 -  Chemotherapy   Patient is on Treatment Plan : HEAD/NECK Nivolumab q14d       PHYSICAL EXAMINATION:  Vitals:   05/07/22 0410 05/07/22 0803  BP: 122/88 130/84  Pulse: (!) 110   Resp: 18   Temp: 98.8 F (37.1 C)   SpO2: 95%    Filed  Weights   04/18/2022 1618  Weight: 77.1 kg    Intake/Output from previous day: 05/25 0701 - 05/26 0700 In: 464 [P.O.:240; IV Piggyback:224] Out: 1650 [Urine:1650]  Physical Exam Vitals reviewed.  Constitutional:      General: He is not in acute distress. HENT:     Head: Normocephalic.     Mouth/Throat:     Pharynx: No oropharyngeal exudate or posterior oropharyngeal erythema.  Cardiovascular:     Rate and Rhythm: Normal rate.  Pulmonary:     Effort: Pulmonary effort is normal. No respiratory distress.  Abdominal:     Palpations: Abdomen is soft.     Comments: J-tube in place.  Liver palpable.  Skin:    General: Skin is warm and dry.  Neurological:     Mental Status: He is alert and oriented to person, place, and time.    LABORATORY DATA:  I have reviewed the data as listed    Latest Ref Rng & Units 05/07/2022    4:25 AM 05/06/2022    6:36 AM 04/30/2022    6:38 PM  CMP  Glucose 70 - 99 mg/dL 106   90   124    BUN 8 - 23 mg/dL _0 Creatinine 0.61 - 1.24 mg/dL 0.99   1.07   1.01    Sodium 135 - 145 mmol/L 136   138   134    Potassium 3.5 - 5.1 mmol/L 4.1   4.2   4.8    Chloride 98 - 111 mmol/L 101   100   94    CO2 22 - 32 mmol/L _1 Calcium 8.9 - 10.3 mg/dL 8.8   9.4   9.9  Total Protein 6.5 - 8.1 g/dL 6.5   7.3   8.3    Total Bilirubin 0.3 - 1.2 mg/dL 2.5   2.1   2.4    Alkaline Phos 38 - 126 U/L 597   659   794    AST 15 - 41 U/L 610   520   513    ALT 0 - 44 U/L 185   156   162      Lab Results  Component Value Date   WBC 7.7 05/07/2022   HGB 9.6 (L) 05/07/2022   HCT 29.6 (L) 05/07/2022   MCV 81.3 05/07/2022   PLT 368 05/07/2022   NEUTROABS 6.7 04/16/2022    Lab Results  Component Value Date   CEA 1.50 10/30/2021   PSA1 2.7 10/30/2021    CT ABDOMEN PELVIS W CONTRAST  Result Date: 04/22/2022 CLINICAL DATA:  Weakness, elevated LFTs, history of gastric cancer status post gastrectomy EXAM: CT ABDOMEN AND PELVIS WITH CONTRAST  TECHNIQUE: Multidetector CT imaging of the abdomen and pelvis was performed using the standard protocol following bolus administration of intravenous contrast. RADIATION DOSE REDUCTION: This exam was performed according to the departmental dose-optimization program which includes automated exposure control, adjustment of the mA and/or kV according to patient size and/or use of iterative reconstruction technique. CONTRAST:  5m OMNIPAQUE IOHEXOL 300 MG/ML  SOLN COMPARISON:  PET-CT dated 01/18/2022 FINDINGS: Lower chest: Trace bilateral pleural effusions, right greater than left. Associated right lower lobe atelectasis. Hepatobiliary: Innumerable hepatic metastases in both lobes, new. Dominant aggregate lesion measures 8.8 cm in the lateral segment left hepatic lobe (series 2/image 25). Index lesion in the inferior right hepatic lobe measures 4.8 cm (series 2/image 50). Gallbladder is notable for layering sludge (series 2/image 52), without associated inflammatory changes. No intrahepatic or extrahepatic duct dilatation. Pancreas: Within normal limits. Spleen: Within normal limits. Adrenals/Urinary Tract: Adrenal glands are within normal limits. Kidneys are within normal limits.  No hydronephrosis. Bladder is mildly thick-walled although underdistended. Stomach/Bowel: Status post gastrectomy. Percutaneous jejunostomy.  No evidence of bowel obstruction. Appendix is not discretely visualized, reportedly surgically absent. No colonic wall thickening or mass is evident on CT. Vascular/Lymphatic: No evidence of abdominal aortic aneurysm. 14 mm short axis node in the portacaval region (series 2/image 33). Reproductive: Prostate is unremarkable. Other: Moderate abdominopelvic ascites, minimally complex. Musculoskeletal: Mild degenerative changes of the lumbar spine. IMPRESSION: Status post gastrectomy with percutaneous jejunostomy. Innumerable hepatic metastases, new, with index lesions as above. 14 mm short axis portacaval  node, suspicious for nodal metastasis. Moderate abdominopelvic ascites. Trace bilateral pleural effusions. Electronically Signed   By: SJulian HyM.D.   On: 04/21/2022 19:28   [place]KG SITE RITE  Result Date: 05/06/2022 If Site Rite image not attached, placement could not be confirmed due to current cardiac rhythm.    No future appointments.     LOS: 1 day

## 2022-05-07 NOTE — Progress Notes (Signed)
Nutrition Follow-up  DOCUMENTATION CODES:   Severe malnutrition in context of chronic illness  INTERVENTION:   Monitor magnesium, potassium, and phosphorus BID for at least 3 days, MD to replete as needed, as pt is at risk for refeeding syndrome.   -Continue Vital 1.5 @ 20 ml/hr via J-tube  -90 ml Prosource TF TID -Provides 960 kcals, 98g protein and 366 ml H2O   If able to advance, advance by 10 ml every 24 hours to goal below.  Goal rate for TF: -Vital 1.5 @ 55 ml/hr via J-tube x 24 hours -45 ml Prosource TF BID -Provides 2060 kcals, 111g protein and 1008 ml H2O   -Once IVF are d/c, free water recommended of 100 ml every 3 hours (800 ml)  -Consider initiation of TPN if pt unable to tolerate advancement in tube feedings.   NUTRITION DIAGNOSIS:   Severe Malnutrition related to chronic illness, cancer and cancer related treatments as evidenced by energy intake < or equal to 75% for > or equal to 1 month, percent weight loss, moderate fat depletion, mild fat depletion, moderate muscle depletion.  GOAL:   Patient will meet greater than or equal to 90% of their needs  MONITOR:   Labs, Weight trends, TF tolerance, I & O's  REASON FOR ASSESSMENT:   Consult, Malnutrition Screening Tool Enteral/tube feeding initiation and management  ASSESSMENT:   66 y.o. male with medical history significant of CAD s/p stent, HTN, HLD, DM2. Pt with recent history of stomach cancer s/p gastric resection and feeding tube.  On chemo therapy due to positive nodes.  Presented with 1 month history of nausea and vomiting with abdominal pain.  Progressed to intractable nausea and vomiting.  Was hospitalized for further management.  CT scan raised concern for hepatic metastases.  5/25: PICC placed  Spoke with pt's wife, RN and surgery PA in room during visit. Pt sleeping throughout encounter. Per RN, pt had to have tube feeding turned off for a few hours at a time d/t complaints of bloating and  abdominal pain. Turned back on to 20 ml/hr at 1000 today. Will continue to monitor tolerance and slowly advance if he can tolerate this.  Per RN, pt received all doses of Prosource TF except for 2 yesterday. Received 90 ml this morning.   Pt starting chemotherapy today.   Admission weight: 170 lbs. Daily weights have been ordered. No other weights yet.  Medications: Miralax, senokot, Lactated ringers  Labs reviewed: CBGs: 101-154  Diet Order:   Diet Order             Diet clear liquid Room service appropriate? Yes; Fluid consistency: Thin  Diet effective now                   EDUCATION NEEDS:   Education needs have been addressed  Skin:  Skin Assessment: Reviewed RN Assessment  Last BM:  5/24  Height:   Ht Readings from Last 1 Encounters:  05/10/2022 6' (1.829 m)    Weight:   Wt Readings from Last 1 Encounters:  04/30/2022 77.1 kg    BMI:  Body mass index is 23.06 kg/m.  Estimated Nutritional Needs:   Kcal:  2200-2400  Protein:  110-120g  Fluid:  2.2L/day  Clayton Bibles, MS, RD, LDN Inpatient Clinical Dietitian Contact information available via Amion

## 2022-05-07 NOTE — TOC Initial Note (Signed)
Transition of Care [facility]) - Initial/Assessment Note    Patient Details  Name: Zachary Bowers MRN: [id number] Date of Birth: 1956/11/16  [facility] CM/SW Contact:    Servando Snare, LCSW Phone Number: 05/07/2022, 9:28 AM  Clinical Narrative:                  Transition of Care Department [facility]) has reviewed patient and no TOC needs have been identified at this time. We will continue to monitor patient advancement through interdisciplinary progression rounds. If new patient transition needs arise, please place a TOC consult.       Patient Goals and CMS Choice        Expected Discharge Plan and Services                                                Prior Living Arrangements/Services                       Activities of Daily Living Home Assistive Devices/Equipment: None ADL Screening (condition at time of admission) Patient's cognitive ability adequate to safely complete daily activities?: Yes Is the patient deaf or have difficulty hearing?: No Does the patient have difficulty seeing, even when wearing glasses/contacts?: No Does the patient have difficulty concentrating, remembering, or making decisions?: No Patient able to express need for assistance with ADLs?: Yes Does the patient have difficulty dressing or bathing?: Yes Independently performs ADLs?: Yes (appropriate for developmental age) Does the patient have difficulty walking or climbing stairs?: Yes Weakness of Legs: Both Weakness of Arms/Hands: Both  Permission Sought/Granted                  Emotional Assessment              Admission diagnosis:  Generalized abdominal pain [R10.84] Nausea & vomiting [R11.2] LFT elevation [R79.89] Nausea and vomiting, unspecified vomiting type [R11.2] Intractable nausea and vomiting [R11.2] Patient Active Problem List   Diagnosis Date Noted   Protein-calorie malnutrition, severe 05/07/2022   Intractable nausea and vomiting  05/06/2022   Nausea & vomiting 04/20/2022   Transaminitis 04/22/2022   DM2 (diabetes mellitus, type 2) ([place]) 04/13/2022   Malnutrition of moderate degree 02/17/2022   Gastric cancer ([place]) 02/11/2022   Malignant neoplasm of cardia of stomach ([place]) 10/28/2021   Symptomatic anemia 10/08/2021   Gastric mass 10/08/2021   Chest pain 08/11/2021   Coronary artery disease due to lipid rich plaque 01/13/2015   Essential hypertension 01/13/2015   Hyperlipidemia 01/13/2015   Angina decubitus ([place]) 03/25/2014   Pure hypercholesterolemia 03/25/2014   Obesity 03/25/2014   Essential hypertension, benign 03/25/2014   PCP:  Shirline Frees, MD Pharmacy:   [facility] [place] [address] Phone: [phone] Fax: [place] [place] [facility] [address] AT [facility] [address] [place] [address] Phone: [phone] Fax: [phone]  [facility] [address] [place] [address] Phone: [phone] Fax: [phone]     Social Determinants of Health (SDOH) Interventions    Readmission Risk Interventions     View : No data to display.

## 2022-05-07 NOTE — Progress Notes (Signed)
Chemotherapy education materials given to Zachary Bowers and wife on [place], oxalipatin, leucovorin and fluorouracil. Education reviewed and all questions answered. Consent obtained for previously mentioned chemo regimen.

## 2022-05-07 NOTE — Progress Notes (Signed)
OK to treat with current labs to include elevated LFT's and bili.  T.O. Dr Darletta Moll, PharmD

## 2022-05-07 NOTE — Progress Notes (Addendum)
[facility] PROGRESS NOTE   Zachary Bowers OJJ:[id number] DOB: 1956/03/24 DOA: 04/16/2022  PCP: Shirline Frees, MD  Brief History/Interval Summary:  66 y.o. male with medical history significant of CAD s/p stent, HTN, HLD, DM2. Pt with recent history of stomach cancer s/p gastric resection and feeding tube.  On chemo therapy due to positive nodes.  Presented with 1 month history of nausea and vomiting with abdominal pain.  Progressed to intractable nausea and vomiting.  Was hospitalized for further management.  CT scan raised concern for hepatic metastases.  Consultants: Medical oncology.  Interventional radiology  Procedures: None yet    Subjective/Interval History: Patient continues to have abdominal discomfort.  Apparently could not tolerate his tube feedings yesterday due to development of abdominal pain.  No vomiting but did get some nausea.     Assessment/Plan:  Intractable nausea vomiting in the setting of gastric cancer CT scan did not show any bowel obstruction.  He has jejunostomy tube which appears to be in place as per CT scan.  His nausea and vomiting could be from his new metastatic process noted on the CT imaging study. Continue symptomatic treatment.  Pain control. Some of his discomfort could also be from constipation.  Will initiate laxatives.  May have to consider metoclopramide. Patient was also started on Zosyn at the time of admission due to concern for intra-abdominal infection.  WBCs noted to be normal.  Procalcitonin noted to be elevated.  Patient noted to be afebrile.  LFTs are abnormal but stable.  Ductal dilatation noted on CT scan.  Blood cultures negative so far.  We will leave him on Zosyn for now.  If he remains stable then could be discontinued in the next 24 to 48 hours.  Could have other reasons for elevated procalcitonin levels.  Gastric cancer with concern for hepatic metastases The lesions in the liver are new compared to previous imaging  study.  Patient followed by Dr. Benay Spice with medical oncology.   Looks like patient has received multiple cycles of Taxol/carboplatin.  At last oncology visit consideration was being given to adjuvant immunotherapy though he was thought not to be a good candidate for same due to his physical deconditioning and weight loss. Patient seen by medical oncology.  Patient to be started on chemotherapy with FOLFOX/Herceptin today.  Status post jejunostomy No obstruction noted on imaging studies.  Continue tube feedings as tolerated.  Nutritionist is following.  Trickle feed.  Laxatives have been ordered.    Transaminitis Likely secondary to metastases in the liver noted on imaging studies.  Bilirubin is mildly elevated.  No biliary ductal dilatation noted on CT scan.  Images were reviewed by gastroenterology.  No role for biliary stenting at this time.  Diabetes mellitus type 2 Monitor CBGs.  Normocytic anemia Likely due to malignancy.  No evidence of overt bleeding.  Drop in hemoglobin is likely dilutional.  Essential hypertension Blood pressure reasonably well controlled.  Noted to be on amlodipine and Avapro.  Hypothyroidism Continue levothyroxine.  Hyperlipidemia Continue statin.   DVT Prophylaxis: Lovenox Code Status: Full code Family Communication: Discussed with patient. Disposition Plan: Hopefully return home when improved.  Mobilize.  PT and OT evaluation.  Status is: Inpatient Remains inpatient appropriate because: Need for chemotherapy     Medications: Scheduled:  amLODipine  10 mg Per Tube Daily   aspirin  81 mg Per Tube Daily   atorvastatin  80 mg Per Tube Daily   Chlorhexidine Gluconate Cloth  6 each Topical Daily  enoxaparin (LOVENOX) injection  40 mg Subcutaneous Q24H   feeding supplement (PROSource TF)  90 mL Per Tube TID   feeding supplement (VITAL 1.5 CAL)  1,000 mL Per Tube Q24H   insulin aspart  0-9 Units Subcutaneous Q4H   irbesartan  150 mg Oral Daily    levothyroxine  125 mcg Per Tube QAC breakfast   polyethylene glycol  17 g Per Tube Daily   sennosides  10 mL Per Tube BID   sodium chloride flush  10-40 mL Intracatheter Q12H   Continuous:  lactated ringers 125 mL/hr at 05/07/22 0940   piperacillin-tazobactam (ZOSYN)  IV 3.375 g (05/07/22 0537)   XIP:[id number], morphine injection, ondansetron **OR** ondansetron (ZOFRAN) IV, sodium chloride flush  Antibiotics: Anti-infectives (From admission, onward)    Start     Dose/Rate Route Frequency Ordered Stop   04/18/2022 2015  piperacillin-tazobactam (ZOSYN) IVPB 3.375 g        3.375 g 12.5 mL/hr over 240 Minutes Intravenous Every 8 hours 04/19/2022 2013         Objective:  Vital Signs  Vitals:   05/06/22 1323 05/06/22 1956 05/07/22 0410 05/07/22 0803  BP: 139/84 (!) 138/93 122/88 130/84  Pulse: (!) 102 (!) 106 (!) 110   Resp:  18 18   Temp: 98.1 F (36.7 C) 99 F (37.2 C) 98.8 F (37.1 C)   TempSrc: Oral Oral Oral   SpO2: 96% 94% 95%   Weight:      Height:        Intake/Output Summary (Last 24 hours) at 05/07/2022 1004 Last data filed at 05/07/2022 0639 Gross per 24 hour  Intake 464 ml  Output 1450 ml  Net -986 ml    Filed Weights   05/10/2022 1618  Weight: 77.1 kg    General appearance: Awake alert.  In no distress Resp: Clear to auscultation bilaterally.  Normal effort Cardio: S1-S2 is normal regular.  No S3-S4.  No rubs murmurs or bruit GI: Abdomen is soft.  PEG tube is noted.  Mildly tender diffusely more so in the right upper quadrant without any rebound rigidity or guarding.  No masses or organomegaly. Extremities: No edema.  Full range of motion of lower extremities. Neurologic: Alert and oriented x3.  No focal neurological deficits.    Lab Results:  Data Reviewed: I have personally reviewed following labs and reports of the imaging studies  CBC: Recent Labs  Lab 05/03/2022 1434 04/15/2022 1838 05/06/22 0542 05/07/22 0425  WBC 7.2 7.9 7.8  7.7  NEUTROABS 5.4 6.7  --   --   HGB 11.8* 11.5* 10.7* 9.6*  HCT 38.0* 36.6* 33.2* 29.6*  MCV 79.8* 80.1 80.6 81.3  PLT 488* 459* 326 368     Basic Metabolic Panel: Recent Labs  Lab 05/09/2022 1434 05/03/2022 1838 05/06/22 0636 05/06/22 1743 05/07/22 0425  NA 135 134* 138  --  136  K 4.1 4.8 4.2  --  4.1  CL 96* 94* 100  --  101  CO2 '28 29 27  '$ --  25  GLUCOSE 123* 124* 90  --  106*  BUN '21 23 23  '$ --  24*  CREATININE 0.89 1.01 1.07  --  0.99  CALCIUM 10.1 9.9 9.4  --  8.8*  MG  --   --  2.4 2.2 2.3  PHOS  --   --  3.8 3.4 3.4     GFR: Estimated Creatinine Clearance: 80 mL/min (by C-G formula based on SCr of 0.99 mg/dL).  Liver  Function Tests: Recent Labs  Lab 05/11/2022 1434 04/18/2022 1838 05/06/22 0636 05/07/22 0425  AST 491* 513* 520* 610*  ALT 151* 162* 156* 185*  ALKPHOS 810* 794* 659* 597*  BILITOT 2.6* 2.4* 2.1* 2.5*  PROT 8.3* 8.3* 7.3 6.5  ALBUMIN 3.9 3.8 3.0* 2.7*     Recent Labs  Lab 04/26/2022 1838  LIPASE 57*    Recent Labs  Lab 05/02/2022 2213  AMMONIA 25     Coagulation Profile: Recent Labs  Lab 04/24/2022 1838  INR 1.0     CBG: Recent Labs  Lab 05/06/22 1526 05/06/22 1954 05/07/22 0013 05/07/22 0412 05/07/22 0731  GLUCAP 130* 107* 109* 102* 101*      Radiology Studies: CT ABDOMEN PELVIS W CONTRAST  Result Date: 04/13/2022 CLINICAL DATA:  Weakness, elevated LFTs, history of gastric cancer status post gastrectomy EXAM: CT ABDOMEN AND PELVIS WITH CONTRAST TECHNIQUE: Multidetector CT imaging of the abdomen and pelvis was performed using the standard protocol following bolus administration of intravenous contrast. RADIATION DOSE REDUCTION: This exam was performed according to the departmental dose-optimization program which includes automated exposure control, adjustment of the mA and/or kV according to patient size and/or use of iterative reconstruction technique. CONTRAST:  38m OMNIPAQUE IOHEXOL 300 MG/ML  SOLN COMPARISON:  PET-CT  dated 01/18/2022 FINDINGS: Lower chest: Trace bilateral pleural effusions, right greater than left. Associated right lower lobe atelectasis. Hepatobiliary: Innumerable hepatic metastases in both lobes, new. Dominant aggregate lesion measures 8.8 cm in the lateral segment left hepatic lobe (series 2/image 25). Index lesion in the inferior right hepatic lobe measures 4.8 cm (series 2/image 50). Gallbladder is notable for layering sludge (series 2/image 52), without associated inflammatory changes. No intrahepatic or extrahepatic duct dilatation. Pancreas: Within normal limits. Spleen: Within normal limits. Adrenals/Urinary Tract: Adrenal glands are within normal limits. Kidneys are within normal limits.  No hydronephrosis. Bladder is mildly thick-walled although underdistended. Stomach/Bowel: Status post gastrectomy. Percutaneous jejunostomy.  No evidence of bowel obstruction. Appendix is not discretely visualized, reportedly surgically absent. No colonic wall thickening or mass is evident on CT. Vascular/Lymphatic: No evidence of abdominal aortic aneurysm. 14 mm short axis node in the portacaval region (series 2/image 33). Reproductive: Prostate is unremarkable. Other: Moderate abdominopelvic ascites, minimally complex. Musculoskeletal: Mild degenerative changes of the lumbar spine. IMPRESSION: Status post gastrectomy with percutaneous jejunostomy. Innumerable hepatic metastases, new, with index lesions as above. 14 mm short axis portacaval node, suspicious for nodal metastasis. Moderate abdominopelvic ascites. Trace bilateral pleural effusions. Electronically Signed   By: SJulian HyM.D.   On: 05/08/2022 19:28   [place] SITE RITE  Result Date: 05/06/2022 If Site Rite image not attached, placement could not be confirmed due to current cardiac rhythm.      LOS: 1 day   Kenechukwu Eckstein [employer] [email or web address]  05/07/2022, 10:04 AM

## 2022-05-08 DIAGNOSIS — R112 Nausea with vomiting, unspecified: Secondary | ICD-10-CM | POA: Diagnosis not present

## 2022-05-08 DIAGNOSIS — R110 Nausea: Secondary | ICD-10-CM

## 2022-05-08 DIAGNOSIS — D630 Anemia in neoplastic disease: Secondary | ICD-10-CM

## 2022-05-08 DIAGNOSIS — Z5111 Encounter for antineoplastic chemotherapy: Secondary | ICD-10-CM

## 2022-05-08 DIAGNOSIS — R1084 Generalized abdominal pain: Secondary | ICD-10-CM | POA: Diagnosis not present

## 2022-05-08 DIAGNOSIS — R7401 Elevation of levels of liver transaminase levels: Secondary | ICD-10-CM | POA: Diagnosis not present

## 2022-05-08 DIAGNOSIS — C160 Malignant neoplasm of cardia: Secondary | ICD-10-CM | POA: Diagnosis not present

## 2022-05-08 LAB — CBC
HCT: 26.2 % — ABNORMAL LOW (ref 39.0–52.0)
Hemoglobin: 8.2 g/dL — ABNORMAL LOW (ref 13.0–17.0)
MCH: 25.5 pg — ABNORMAL LOW (ref 26.0–34.0)
MCHC: 31.3 g/dL (ref 30.0–36.0)
MCV: 81.4 fL (ref 80.0–100.0)
Platelets: 300 10*3/uL (ref 150–400)
RBC: 3.22 MIL/uL — ABNORMAL LOW (ref 4.22–5.81)
RDW: 16.3 % — ABNORMAL HIGH (ref 11.5–15.5)
WBC: 8.9 10*3/uL (ref 4.0–10.5)
nRBC: 0 % (ref 0.0–0.2)

## 2022-05-08 LAB — GLUCOSE, CAPILLARY
Glucose-Capillary: 161 mg/dL — ABNORMAL HIGH (ref 70–99)
Glucose-Capillary: 162 mg/dL — ABNORMAL HIGH (ref 70–99)
Glucose-Capillary: 162 mg/dL — ABNORMAL HIGH (ref 70–99)
Glucose-Capillary: 164 mg/dL — ABNORMAL HIGH (ref 70–99)
Glucose-Capillary: 193 mg/dL — ABNORMAL HIGH (ref 70–99)
Glucose-Capillary: 208 mg/dL — ABNORMAL HIGH (ref 70–99)

## 2022-05-08 LAB — COMPREHENSIVE METABOLIC PANEL
ALT: 258 U/L — ABNORMAL HIGH (ref 0–44)
AST: 796 U/L — ABNORMAL HIGH (ref 15–41)
Albumin: 2.2 g/dL — ABNORMAL LOW (ref 3.5–5.0)
Alkaline Phosphatase: 513 U/L — ABNORMAL HIGH (ref 38–126)
Anion gap: 9 (ref 5–15)
BUN: 34 mg/dL — ABNORMAL HIGH (ref 8–23)
CO2: 24 mmol/L (ref 22–32)
Calcium: 8.3 mg/dL — ABNORMAL LOW (ref 8.9–10.3)
Chloride: 99 mmol/L (ref 98–111)
Creatinine, Ser: 1.1 mg/dL (ref 0.61–1.24)
GFR, Estimated: >60 mL/min (ref >60)
Glucose, Bld: 154 mg/dL — ABNORMAL HIGH (ref 70–99)
Potassium: 3.9 mmol/L (ref 3.5–5.1)
Sodium: 132 mmol/L — ABNORMAL LOW (ref 135–145)
Total Bilirubin: 1.7 mg/dL — ABNORMAL HIGH (ref 0.3–1.2)
Total Protein: 5.9 g/dL — ABNORMAL LOW (ref 6.5–8.1)

## 2022-05-08 LAB — PROCALCITONIN: Procalcitonin: 1.45 ng/mL

## 2022-05-08 MED ORDER — METOCLOPRAMIDE HCL 5 MG/ML IJ SOLN
5.0000 mg | Freq: Three times a day (TID) | INTRAMUSCULAR | Status: DC
  Administered 2022-05-08 – 2022-05-12 (×13): 5 mg via INTRAVENOUS
  Filled 2022-05-08 (×13): qty 2

## 2022-05-08 MED ORDER — HYDROMORPHONE HCL 2 MG PO TABS
1.0000 mg | ORAL_TABLET | ORAL | Status: DC | PRN
  Administered 2022-05-08 – 2022-05-11 (×3): 1 mg via ORAL
  Filled 2022-05-08 (×3): qty 1

## 2022-05-08 NOTE — Progress Notes (Addendum)
Physical Therapy Treatment Patient Details Name: Zachary Bowers MRN: [id number] DOB: 1956/08/24 Today's Date: 05/08/2022   History of Present Illness 66 y.o. male with medical history significant of CAD s/p stent, HTN, HLD, DM2 appendectomy. Pt with recent history of stomach cancer s/p gastric resection and feeding tube.  On chemo therapy due to positive nodes.  Presented with 1 month history of nausea and vomiting with abdominal pain.  Progressed to intractable nausea and vomiting.    CT scan that confirmed significant metastatic disease almost essentially replacing his liver.Patient admitted for starting salvage chemotherapy.    PT Comments    Arranged with RN for patient to have nausea medication just prior to therapy. Patient awake and willing to get up. Patient did not have any nausea. Patient assisted to stand and  take a few steps to recliner, moves quickly to get there. Continue PT for mobility.   Recommendations for follow up therapy are one component of a multi-disciplinary discharge planning process, led by the attending physician.  Recommendations may be updated based on patient status, additional functional criteria and insurance authorization.  Follow Up Recommendations  Home health PT     Assistance Recommended at Discharge Frequent or constant Supervision/Assistance  Patient can return home with the following A lot of help with walking and/or transfers;A little help with bathing/dressing/bathroom;Help with stairs or ramp for entrance;Assist for transportation;Assistance with cooking/housework   Equipment Recommendations   (May consider a WC and cushion)    Recommendations for Other Services       Precautions / Restrictions Precautions Precautions: Fall Precaution Comments: N/V, chemo precautions Restrictions Weight Bearing Restrictions: No     Mobility  Bed Mobility Overal bed mobility: Needs Assistance Bed Mobility: Supine to Sit     Supine to sit: Mod  assist    General bed mobility comments: patient asking for UE support to pull to sitting.    Transfers Overall transfer level: Needs assistance Equipment used: Rolling walker (2 wheels) Transfers: Sit to/from Stand, Bed to chair/wheelchair/BSC Sit to Stand: Mod assist, +2 safety/equipment   Step pivot transfers: Mod assist, +2 safety/equipment       General transfer comment: patient  not with in the RW, forward flexed, moved quickly to recliner, taking shuffle steps, cues  reach back to sit down    Ambulation/Gait                   Stairs             Wheelchair Mobility    Modified Rankin (Stroke Patients Only)       Balance Overall balance assessment: Needs assistance Sitting-balance support: No upper extremity supported, Feet supported Sitting balance-Leahy Scale: Fair     Standing balance support: Bilateral upper extremity supported, During functional activity, Reliant on assistive device for balance Standing balance-Leahy Scale: Poor Standing balance comment: TBA                            Cognition Arousal/Alertness: Awake/alert Behavior During Therapy: WFL for tasks assessed/performed, Flat affect Overall Cognitive Status: Within Functional Limits for tasks assessed                                 General Comments: patient limited  communication, having dry heaves, follows directions        Exercises      General Comments  Pertinent Vitals/Pain Pain Assessment Pain Assessment: Faces Faces Pain Scale: Hurts little more Pain Location: "gut" Pain Descriptors / Indicators: Discomfort Pain Intervention(s): Monitored during session, Premedicated before session    Home Living Family/patient expects to be discharged to:: Private residence Living Arrangements: Spouse/significant other   Type of Home: House Home Access: Stairs to enter   [employer] of Steps: 2 with a wall   Home Layout:  One level Home Equipment: Conservation officer, nature (2 wheels)      Prior Function            PT Goals (current goals can now be found in the care plan section) Acute Rehab PT Goals Patient Stated Goal: agreed to trying OOB, return home PT Goal Formulation: With patient/family Time For Goal Achievement: 05/31/2022 Potential to Achieve Goals: Fair Progress towards PT goals: Progressing toward goals    Frequency    Min 3X/week      PT Plan Current plan remains appropriate    Co-evaluation PT/OT/SLP Co-Evaluation/Treatment: Yes Reason for Co-Treatment: To address functional/ADL transfers;For patient/therapist safety PT goals addressed during session: Mobility/safety with mobility OT goals addressed during session: ADL's and self-care      AM-PAC PT "6 Clicks" Mobility   Outcome Measure  Help needed turning from your back to your side while in a flat bed without using bedrails?: A Lot Help needed moving from lying on your back to sitting on the side of a flat bed without using bedrails?: A Lot Help needed moving to and from a bed to a chair (including a wheelchair)?: A Lot Help needed standing up from a chair using your arms (e.g., wheelchair or bedside chair)?: A Lot Help needed to walk in hospital room?: Total Help needed climbing 3-5 steps with a railing? : Total 6 Click Score: 10    End of Session   Activity Tolerance: Patient limited by fatigue Patient left: in chair;with call bell/phone within reach;with family/visitor present Nurse Communication: Mobility status PT Visit Diagnosis: Unsteadiness on feet (R26.81);Muscle weakness (generalized) (M62.81);Difficulty in walking, not elsewhere classified (R26.2);Pain     Time: 1200-1218 PT Time Calculation (min) (ACUTE ONLY): 18 min  Charges:  $Therapeutic Activity: 8-22 mins                    Tresa Endo PT Acute Rehabilitation Services Pager [phone] Office [phone]  Claretha Cooper 05/08/2022, 12:59  PM

## 2022-05-08 NOTE — Progress Notes (Signed)
HEMATOLOGY-ONCOLOGY PROGRESS NOTE  Date of service .05/08/2022.  Subjective  [name] was seen in follow-up for his gastric cancer and for toxicity check.  He was started on second line therapy with FOLFOX plus trastuzumab for HER2 positive metastatic gastric cancer. He notes he feels fairly fatigued.  Grade 1 nausea from chemotherapy.  Currently his pain is 3 out of 10 over the right upper abdomen.  He notes it is fairly controlled with his current pain regimen. Has not had a bowel movement since Tuesday and has been placed on some laxatives. Has been getting tube feeding through his J-tube. Several family members at bedside. No other acute new symptoms. Labs done today were reviewed with the patient and family.  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[place])   10/28/2021 Cancer Staging   Staging form: Stomach, AJCC 8th Edition - Clinical: Stage Unknown (cTX, cN1, cM0) - Signed by Ladell Pier, MD on 10/28/2021   11/12/2021 - 12/18/2021 Chemotherapy   Patient is on Treatment Plan : ESOPHAGUS Carboplatin/PACLitaxel weekly x 6 weeks with XRT       05/07/2022 -  Chemotherapy   Patient is on Treatment Plan : GASTROESOPHAGEAL FOLFOX D1,15 + Trastuzumab (6/4) D1,15 + Pembrolizumab (200) every 3 weeks, q28d     Gastric cancer ([place])  02/11/2022 Initial Diagnosis   Gastric cancer ([place])   03/04/2022 Cancer Staging   Staging form: Stomach, AJCC 8th Edition - Pathologic: Stage IIA (pT2, pN1, cM0) - Signed by Ladell Pier, MD on 03/04/2022 Histologic grade (G): G2 Histologic grading system: 3 grade system Residual tumor (R): R0 - None   04/02/2022 -  Chemotherapy   Patient is on Treatment Plan : HEAD/NECK Nivolumab q14d       PHYSICAL EXAMINATION:  Vitals:   05/07/22 1956 05/08/22 0405  BP: 123/79 129/82  Pulse: (!) 109 96  Resp: 18 18  Temp: 98.3 F (36.8 C) 97.9 F (36.6 C)   SpO2: 94% 92%   Filed Weights   04/14/2022 1618 05/08/22 0407  Weight: 170 lb (77.1 kg) 186 lb 1.1 oz (84.4 kg)    Intake/Output from previous day: 05/26 0701 - 05/27 0700 In: 2433.3 [I.V.:2383.5; IV Piggyback:49.8] Out: 700 [Urine:700] . GENERAL:alert, resting in bed, grade 1 nausea with some abdominal discomfort.  Appears fatigued. SKIN: no acute rashes,  icteric appearing skin OROPHARYNX: MMM NECK: supple, no JVD LYMPH:  no palpable lymphadenopathy in the cervical, axillary or inguinal regions LUNGS: clear to auscultation b/l with normal respiratory effort HEART: regular rate & rhythm ABDOMEN: Abdomen distended with hepatomegaly.  Tenderness to palpation over the upper abdomen.  Somewhat hypoactive bowel sounds Extremity: no pedal edema NEURO: Moving all 4 extremities without any overt focal deficits  LABORATORY DATA:  I have reviewed the data as listed    Latest Ref Rng & Units 05/08/2022    5:44 AM 05/07/2022    4:25 AM 05/06/2022    6:36 AM  CMP  Glucose 70 - 99 mg/dL 154   106   90    BUN 8 - 23 mg/dL 34   24   23    Creatinine 0.61 - 1.24 mg/dL 1.10   0.99   1.07    Sodium 135 - 145 mmol/L 132   136   138    Potassium 3.5 - 5.1 mmol/L 3.9   4.1   4.2    Chloride 98 -  111 mmol/L 99   101   100    CO2 22 - 32 mmol/L _0 Calcium 8.9 - 10.3 mg/dL 8.3   8.8   9.4    Total Protein 6.5 - 8.1 g/dL 5.9   6.5   7.3    Total Bilirubin 0.3 - 1.2 mg/dL 1.7   2.5   2.1    Alkaline Phos 38 - 126 U/L 513   597   659    AST 15 - 41 U/L 796   610   520    ALT 0 - 44 U/L 258   185   156     .    Latest Ref Rng & Units 05/08/2022    5:44 AM 05/07/2022    4:25 AM 05/06/2022    5:42 AM  CBC  WBC 4.0 - 10.5 K/uL 8.9   7.7   7.8    Hemoglobin 13.0 - 17.0 g/dL 8.2   9.6   10.7    Hematocrit 39.0 - 52.0 % 26.2   29.6   33.2    Platelets 150 - 400 K/uL 300   368   326      Lab Results  Component Value Date   CEA 1.50 10/30/2021   PSA1 2.7 10/30/2021    CT ABDOMEN  PELVIS W CONTRAST  Result Date: 05/12/2022 CLINICAL DATA:  Weakness, elevated LFTs, history of gastric cancer status post gastrectomy EXAM: CT ABDOMEN AND PELVIS WITH CONTRAST TECHNIQUE: Multidetector CT imaging of the abdomen and pelvis was performed using the standard protocol following bolus administration of intravenous contrast. RADIATION DOSE REDUCTION: This exam was performed according to the departmental dose-optimization program which includes automated exposure control, adjustment of the mA and/or kV according to patient size and/or use of iterative reconstruction technique. CONTRAST:  14m OMNIPAQUE IOHEXOL 300 MG/ML  SOLN COMPARISON:  PET-CT dated 01/18/2022 FINDINGS: Lower chest: Trace bilateral pleural effusions, right greater than left. Associated right lower lobe atelectasis. Hepatobiliary: Innumerable hepatic metastases in both lobes, new. Dominant aggregate lesion measures 8.8 cm in the lateral segment left hepatic lobe (series 2/image 25). Index lesion in the inferior right hepatic lobe measures 4.8 cm (series 2/image 50). Gallbladder is notable for layering sludge (series 2/image 52), without associated inflammatory changes. No intrahepatic or extrahepatic duct dilatation. Pancreas: Within normal limits. Spleen: Within normal limits. Adrenals/Urinary Tract: Adrenal glands are within normal limits. Kidneys are within normal limits.  No hydronephrosis. Bladder is mildly thick-walled although underdistended. Stomach/Bowel: Status post gastrectomy. Percutaneous jejunostomy.  No evidence of bowel obstruction. Appendix is not discretely visualized, reportedly surgically absent. No colonic wall thickening or mass is evident on CT. Vascular/Lymphatic: No evidence of abdominal aortic aneurysm. 14 mm short axis node in the portacaval region (series 2/image 33). Reproductive: Prostate is unremarkable. Other: Moderate abdominopelvic ascites, minimally complex. Musculoskeletal: Mild degenerative changes of  the lumbar spine. IMPRESSION: Status post gastrectomy with percutaneous jejunostomy. Innumerable hepatic metastases, new, with index lesions as above. 14 mm short axis portacaval node, suspicious for nodal metastasis. Moderate abdominopelvic ascites. Trace bilateral pleural effusions. Electronically Signed   By: SJulian HyM.D.   On: 04/16/2022 19:28   [place] SITE RITE  Result Date: 05/06/2022 If Site Rite image not attached, placement could not be confirmed due to current cardiac rhythm.    No future appointments.   ASSESSMENT AND PLAN: Gastric cancer CT abdomen/pelvis 10/08/2021-probable mass in the proximal stomach, enlarged gastropathic ligament node  Upper endoscopy 10/01/2021-gastric cardia/fundus mass with oozing and stigmata of recent bleeding-biopsy at least intramucosal adenocarcinoma, mismatch repair protein expression intact, PD-L1 combined positive score 7%, HER2 positive PET 10/20/2021-hypermetabolic proximal gastric primary, or hypermetabolic gastropathic ligament node, right paramidline prostatic hypermetabolism, anal hypermetabolism without a CT mass Radiation 11/11/2021-12/22/2021 Cycle 1  Taxol/carboplatin 11/12/2021 Cycle 2 Taxol/carboplatin 11/19/2021 Cycle 3 Taxol/carboplatin 11/26/2021 Cycle 4 Taxol/carboplatin 12/03/2021 Cycle 5 Taxol/carboplatin 12/18/2021 PET 01/19/2022-decreased hypermetabolism at the gastric cardia, decreased size and metabolic activity associated with an enlarged gastrohepatic node, persistent anal hypermetabolism, stable lateral left lower lobe nodule, possible new more medial subpleural left lower lobe nodule-both nodules below PET resolution 02/11/2022-total gastrectomy with Roux-en-Y esophagojejunostomy, placement of jejunal feeding tube,ypT2,ypN1 moderate to poorly differentiated adenocarcinoma at the GE junction measuring 2.5 x 2.3 cm, lymphovascular invasion present, negative resection margins, 2/28 lymph nodes CT abdomen/pelvis with contrast  04/12/2022-innumerable hepatic metastases which are new, portacaval node suspicious for nodal metastasis.   Microcytic anemia secondary to #1 Diabetes Hypertension Coronary artery disease, cardiac catheterization 08/11/2021-placement of proximal and distal right coronary artery stents Hypothyroidism Sleep apnea Anal hypermetabolism on the PET 10/20/2021-no mass identified Hospital admission 04/20/2022-nausea and vomiting, transaminitis. Plan -Patient notes no acute toxicities from his FOLFOX plus trastuzumab at this time -Labs reviewed with the patient still with significant elevated LFTs which showed elevated transaminases but improved bilirubin. -Has grade 1-2 nausea requiring antinausea medications. -Receiving tube feeding through the J-tube. -He notes constipation with no bowel movement for the last 3 to 4 days he is on MiraLAX and liquid senna twice daily. -Avoid all hepatotoxic medications. -Family at bedside providing comfort. -Switched hydrocodone/acetaminophen to Dilaudid to avoid use of acetaminophen in the context of abnormal liver functions. -As needed morphine. -Assessment of need for IV pain medications over the next 2 to 3 days can be translated into long-acting pain medications if needed. -Continue treatment as per plan. -Ordered iron labs and B12 to evaluate for nutritional deficiencies adding to his anemia which is primarily from his metastatic malignancy and poor p.o. intake. -His ferritin is likely to be elevated in the setting of transaminitis so he might need IV iron anyways.  Same might be the case with the B12 as well. -She will follow-up on the patient tomorrow and Dr. Malachy Mood will see the patient on 05/10/2022.   LOS: 2 days   Sullivan Lone MD MS Hematology/Oncology Physician [facility]

## 2022-05-08 NOTE — Progress Notes (Signed)
[facility] PROGRESS NOTE   Zachary Bowers OBS:[id number] DOB: 07-01-1956 DOA: 04/23/2022  PCP: Shirline Frees, MD  Brief History/Interval Summary:  66 y.o. male with medical history significant of CAD s/p stent, HTN, HLD, DM2. Pt with recent history of stomach cancer s/p gastric resection and feeding tube.  On chemo therapy due to positive nodes.  Presented with 1 month history of nausea and vomiting with abdominal pain.  Progressed to intractable nausea and vomiting.  Was hospitalized for further management.  CT scan raised concern for hepatic metastases.  Consultants: Medical oncology.  Interventional radiology  Procedures: None yet    Subjective/Interval History: Patient continues to have pain in the upper abdomen mainly in the right upper quadrant.  Had some nausea and dry heaves yesterday.  Seems to be tolerating the tube feedings better now compared to the day before.     Assessment/Plan:  Intractable nausea vomiting in the setting of gastric cancer CT scan did not show any bowel obstruction.  He has jejunostomy tube which appears to be in place as per CT scan.  His nausea and vomiting could be from his new metastatic process noted on the CT imaging study. Some of his discomfort could also be from constipation.  He was given laxatives.  Continues to have nausea.  Will initiate Reglan today.   Patient was also started on Zosyn at the time of admission due to concern for intra-abdominal infection.  WBCs noted to be normal.  Procalcitonin noted to be elevated.  Patient noted to be afebrile.  LFTs are abnormal but stable.  No ductal dilatation noted on CT scan.  Blood cultures negative so far.  There are likely other reasons for why his procalcitonin is elevated.  No obvious infectious source has been found.  We will go ahead and discontinue the Zosyn today and monitor him closely.    Gastric cancer with concern for hepatic metastases The lesions in the liver are new compared  to previous imaging study.  Patient followed by Dr. Benay Spice with medical oncology.   Patient has received multiple cycles of Taxol/carboplatin previously.  At last oncology visit consideration was being given to adjuvant immunotherapy though he was thought not to be a good candidate for same due to his physical deconditioning and weight loss. Patient seen by medical oncology.  Patient started on chemotherapy with FOLFOX/Herceptin on 5/26.    Status post jejunostomy No obstruction noted on imaging studies.  Continue tube feedings as tolerated.  Nutritionist is following.  Trickle feed.  Laxatives have been ordered.  Initiate Reglan today.  Transaminitis Likely secondary to metastases in the liver noted on imaging studies.  Bilirubin is mildly elevated.  No biliary ductal dilatation noted on CT scan.  Images were reviewed by gastroenterology.  No role for biliary stenting at this time. Continue to monitor LFTs every few days.  Diabetes mellitus type 2 Monitor CBGs.  Normocytic anemia Likely due to malignancy.  Drop in hemoglobin is noted.  Likely dilutional.  No overt bleeding noted.  Recheck labs tomorrow.  Transfuse if it drops below 7.    Essential hypertension/hyponatremia Blood pressure reasonably well controlled.  Noted to be on amlodipine and Avapro.  Hypothyroidism Continue levothyroxine.  Hyperlipidemia Continue statin.  Physical deconditioning Patient appears to be quite fatigued.  Likely multifactorial but mainly secondary to his metastatic cancer.  Now he is getting chemotherapy.  Waiting on PT and OT evaluation.  Prognosis is guarded at this time.   DVT Prophylaxis: Lovenox Code Status:  Full code Family Communication: Discussed with patient. Disposition Plan: Hopefully return home when improved.  Mobilize.  PT and OT evaluation.  Status is: Inpatient Remains inpatient appropriate because: Need for chemotherapy     Medications: Scheduled:  amLODipine  10 mg Per  Tube Daily   aspirin  81 mg Per Tube Daily   atorvastatin  80 mg Per Tube Daily   Chlorhexidine Gluconate Cloth  6 each Topical Daily   enoxaparin (LOVENOX) injection  40 mg Subcutaneous Q24H   feeding supplement (PROSource TF)  90 mL Per Tube TID   feeding supplement (VITAL 1.5 CAL)  1,000 mL Per Tube Q24H   flurouracil CHEMO IVPB infusion  1,800 mg/m2 (Treatment Plan Recorded) Intravenous Once   insulin aspart  0-9 Units Subcutaneous Q4H   irbesartan  150 mg Oral Daily   levothyroxine  125 mcg Per Tube QAC breakfast   metoCLOPramide (REGLAN) injection  5 mg Intravenous Q8H   polyethylene glycol  17 g Per Tube Daily   sennosides  10 mL Per Tube BID   sodium chloride flush  10-40 mL Intracatheter Q12H   Continuous:  sodium chloride     famotidine (PEPCID) IV (ONCOLOGY)     lactated ringers 125 mL/hr at 05/08/22 0440   piperacillin-tazobactam (ZOSYN)  IV 3.375 g (05/08/22 0605)   EAV:WUJWJX chloride, albuterol, alteplase, diphenhydrAMINE, EPINEPHrine, famotidine (PEPCID) IV (ONCOLOGY), heparin lock flush, heparin lock flush, HYDROcodone-acetaminophen, methylPREDNISolone sodium succinate, morphine injection, ondansetron **OR** ondansetron (ZOFRAN) IV, sodium chloride flush, sodium chloride flush, sodium chloride flush  Antibiotics: Anti-infectives (From admission, onward)    Start     Dose/Rate Route Frequency Ordered Stop   05/07/22 1430  piperacillin-tazobactam (ZOSYN) IVPB 3.375 g        3.375 g 100 mL/hr over 30 Minutes Intravenous  Once 05/07/22 1420 05/07/22 1512   04/22/2022 2015  piperacillin-tazobactam (ZOSYN) IVPB 3.375 g        3.375 g 12.5 mL/hr over 240 Minutes Intravenous Every 8 hours 04/15/2022 2013         Objective:  Vital Signs  Vitals:   05/07/22 0803 05/07/22 1956 05/08/22 0405 05/08/22 0407  BP: 130/84 123/79 129/82   Pulse:  (!) 109 96   Resp:  18 18   Temp:  98.3 F (36.8 C) 97.9 F (36.6 C)   TempSrc:  Oral Oral   SpO2:  94% 92%   Weight:    84.4  kg  Height:        Intake/Output Summary (Last 24 hours) at 05/08/2022 0908 Last data filed at 05/08/2022 0440 Gross per 24 hour  Intake 2433.28 ml  Output 700 ml  Net 1733.28 ml    Filed Weights   04/29/2022 1618 05/08/22 0407  Weight: 77.1 kg 84.4 kg    General appearance: Awake alert.  In no distress Resp: Clear to auscultation bilaterally.  Normal effort Cardio: S1-S2 is normal regular.  No S3-S4.  No rubs murmurs or bruit GI: Abdomen is soft.  Tender in the right upper quadrant and epigastric area without any rebound rigidity or guarding.  Jejunostomy tube is noted.  No bleeding is appreciated. Extremities: No edema.  Full range of motion of lower extremities. Neurologic: Alert and oriented x3.  No focal neurological deficits.     Lab Results:  Data Reviewed: I have personally reviewed following labs and reports of the imaging studies  CBC: Recent Labs  Lab 04/14/2022 1434 04/12/2022 1838 05/06/22 0542 05/07/22 0425 05/08/22 0544  WBC 7.2 7.9 7.8  7.7 8.9  NEUTROABS 5.4 6.7  --   --   --   HGB 11.8* 11.5* 10.7* 9.6* 8.2*  HCT 38.0* 36.6* 33.2* 29.6* 26.2*  MCV 79.8* 80.1 80.6 81.3 81.4  PLT 488* 459* 326 368 300     Basic Metabolic Panel: Recent Labs  Lab 05/06/2022 1434 04/15/2022 1838 05/06/22 0636 05/06/22 1743 05/07/22 0425 05/07/22 1700 05/08/22 0544  NA 135 134* 138  --  136  --  132*  K 4.1 4.8 4.2  --  4.1  --  3.9  CL 96* 94* 100  --  101  --  99  CO2 '28 29 27  '$ --  25  --  24  GLUCOSE 123* 124* 90  --  106*  --  154*  BUN '21 23 23  '$ --  24*  --  34*  CREATININE 0.89 1.01 1.07  --  0.99  --  1.10  CALCIUM 10.1 9.9 9.4  --  8.8*  --  8.3*  MG  --   --  2.4 2.2 2.3 1.8  --   PHOS  --   --  3.8 3.4 3.4 2.9  --      GFR: Estimated Creatinine Clearance: 72.5 mL/min (by C-G formula based on SCr of 1.1 mg/dL).  Liver Function Tests: Recent Labs  Lab 05/06/2022 1434 04/23/2022 1838 05/06/22 0636 05/07/22 0425 05/08/22 0544  AST 491* 513* 520* 610*  796*  ALT 151* 162* 156* 185* 258*  ALKPHOS 810* 794* 659* 597* 513*  BILITOT 2.6* 2.4* 2.1* 2.5* 1.7*  PROT 8.3* 8.3* 7.3 6.5 5.9*  ALBUMIN 3.9 3.8 3.0* 2.7* 2.2*     Recent Labs  Lab 05/12/2022 1838  LIPASE 57*    Recent Labs  Lab 05/02/2022 2213  AMMONIA 25     Coagulation Profile: Recent Labs  Lab 04/29/2022 1838  INR 1.0     CBG: Recent Labs  Lab 05/07/22 1651 05/07/22 1959 05/08/22 0001 05/08/22 0402 05/08/22 0727  GLUCAP 287* 215* 161* 162* 162*      Radiology Studies: [place] EKG SITE RITE  Result Date: 05/06/2022 If Site Rite image not attached, placement could not be confirmed due to current cardiac rhythm.      LOS: 2 days   [facility] Pager on [email or web address]  05/08/2022, 9:08 AM

## 2022-05-08 NOTE — Evaluation (Signed)
Physical Therapy Evaluation Patient Details Name: Zachary Bowers MRN: [id number] DOB: 10/23/1956 Today's Date: 05/08/2022  History of Present Illness  66 y.o. male with medical history significant of CAD s/p stent, HTN, HLD, DM2 appendectomy. Pt with recent history of stomach cancer s/p gastric resection and feeding tube.  On chemo therapy due to positive nodes.  Presented with 1 month history of nausea and vomiting with abdominal pain.  Progressed to intractable nausea and vomiting.    CT scan that confirmed significant metastatic disease almost essentially replacing his liver.Patient admitted for starting salvage chemotherapy.  Clinical Impression  The patient  willing to sit up on bed edge. Immediate N/V, clear secretions. RN in to give medications. Patient requested to return to supine after ~ 8 minutes . RN will give Nausea medication prior to therapy return . Patient's wife supportive with plans to return home.     Recommendations for follow up therapy are one component of a multi-disciplinary discharge planning process, led by the attending physician.  Recommendations may be updated based on patient status, additional functional criteria and insurance authorization.  Follow Up Recommendations Home health PT    Assistance Recommended at Discharge Frequent or constant Supervision/Assistance  Patient can return home with the following  A lot of help with walking and/or transfers;A little help with bathing/dressing/bathroom;Help with stairs or ramp for entrance;Assist for transportation;Assistance with cooking/housework    Equipment Recommendations None recommended by PT  Recommendations for Other Services       Functional Status Assessment Patient has had a recent decline in their functional status and demonstrates the ability to make significant improvements in function in a reasonable and predictable amount of time.     Precautions / Restrictions Precautions Precautions:  Fall Precaution Comments: N/V, chemo precautions Restrictions Weight Bearing Restrictions: No      Mobility  Bed Mobility Overal bed mobility: Needs Assistance Bed Mobility: Supine to Sit, Sit to Supine     Supine to sit: Mod assist Sit to supine: Mod assist   General bed mobility comments: patient asking for UE support to pull to sitting. Immediate N/V, after sitting 8 minutes, mod assist to return to supine, lifting legs onto bed    Transfers                   General transfer comment: NT    Ambulation/Gait                  Stairs            Wheelchair Mobility    Modified Rankin (Stroke Patients Only)       Balance Overall balance assessment: Needs assistance Sitting-balance support: No upper extremity supported, Feet supported Sitting balance-Leahy Scale: Fair         Standing balance comment: TBA                             Pertinent Vitals/Pain Pain Assessment Pain Assessment: Faces Faces Pain Scale: Hurts even more Pain Location: "gut" Pain Descriptors / Indicators: Discomfort Pain Intervention(s): Premedicated before session, Monitored during session, Limited activity within patient's tolerance    Home Living Family/patient expects to be discharged to:: Private residence Living Arrangements: Spouse/significant other   Type of Home: House Home Access: Stairs to enter   [employer] of Steps: 2 with a wall   Home Layout: One level Home Equipment: Conservation officer, nature (2 wheels)      Prior Function Prior  Level of Function : Needs assist             Mobility Comments: was ambulatory without device until just before this admission.       Hand Dominance        Extremity/Trunk Assessment   Upper Extremity Assessment Upper Extremity Assessment: Defer to OT evaluation    Lower Extremity Assessment Lower Extremity Assessment: Generalized weakness    Cervical / Trunk Assessment Cervical /  Trunk Assessment: Kyphotic  Communication      Cognition Arousal/Alertness: Awake/alert Behavior During Therapy: WFL for tasks assessed/performed Overall Cognitive Status: Difficult to assess                                 General Comments: patient limited  communication, having dry heaves, follows directions        General Comments      Exercises     Assessment/Plan    PT Assessment Patient needs continued PT services  PT Problem List Decreased strength;Decreased knowledge of precautions       PT Treatment Interventions DME instruction;Therapeutic activities;Gait training;Therapeutic exercise;Patient/family education;Functional mobility training    PT Goals (Current goals can be found in the Care Plan section)  Acute Rehab PT Goals Patient Stated Goal: agreed to trying OOB, return home PT Goal Formulation: With patient/family Time For Goal Achievement: June 14, 2022 Potential to Achieve Goals: Fair    Frequency Min 3X/week     Co-evaluation               AM-PAC PT "6 Clicks" Mobility  Outcome Measure Help needed turning from your back to your side while in a flat bed without using bedrails?: A Lot Help needed moving from lying on your back to sitting on the side of a flat bed without using bedrails?: A Lot Help needed moving to and from a bed to a chair (including a wheelchair)?: A Lot Help needed standing up from a chair using your arms (e.g., wheelchair or bedside chair)?: A Lot Help needed to walk in hospital room?: Total Help needed climbing 3-5 steps with a railing? : Total 6 Click Score: 10    End of Session   Activity Tolerance: Treatment limited secondary to medical complications (Comment) Patient left: in bed;with call bell/phone within reach;with nursing/sitter in room;with family/visitor present Nurse Communication: Mobility status PT Visit Diagnosis: Unsteadiness on feet (R26.81);Muscle weakness (generalized) (M62.81);Difficulty in  walking, not elsewhere classified (R26.2);Pain    Time: 1010-1035 PT Time Calculation (min) (ACUTE ONLY): 25 min   Charges:   PT Evaluation $PT Eval Low Complexity: 1 Low PT Treatments $Therapeutic Activity: 8-22 mins        Tresa Endo PT Acute Rehabilitation Services Pager [phone] Office [phone]   Claretha Cooper 05/08/2022, 12:47 PM

## 2022-05-08 NOTE — Evaluation (Signed)
Occupational Therapy Evaluation Patient Details Name: Zachary Bowers MRN: [id number] DOB: 17-May-1956 Today's Date: 05/08/2022   History of Present Illness 66 y.o. male with medical history significant of CAD s/p stent, HTN, HLD, DM2 appendectomy. Pt with recent history of stomach cancer s/p gastric resection and feeding tube.  On chemo therapy due to positive nodes.  Presented with 1 month history of nausea and vomiting with abdominal pain.  Progressed to intractable nausea and vomiting.    CT scan that confirmed significant metastatic disease almost essentially replacing his liver.Patient admitted for starting salvage chemotherapy.   Clinical Impression   Patient is a 66 year old male who was admitted for above. Patient was noted to have increased pain and fatigue impacting session on this date. Patient was  able to participate in standing with a few steps to recliner with cues needed for safety with RW. Patient lives at home with wife prior level with wife support PRN. Patient would continue to benefit from skilled OT services at this time while admitted and after d/c to address noted deficits in order to improve overall safety and independence in ADLs.       Recommendations for follow up therapy are one component of a multi-disciplinary discharge planning process, led by the attending physician.  Recommendations may be updated based on patient status, additional functional criteria and insurance authorization.   Follow Up Recommendations  Home health OT    Assistance Recommended at Discharge Frequent or constant Supervision/Assistance  Patient can return home with the following A lot of help with walking and/or transfers;A lot of help with bathing/dressing/bathroom;Assistance with cooking/housework;Direct supervision/assist for financial management;Assist for transportation;Help with stairs or ramp for entrance;Direct supervision/assist for medications management    Functional Status  Assessment  Patient has had a recent decline in their functional status and demonstrates the ability to make significant improvements in function in a reasonable and predictable amount of time.  Equipment Recommendations  Other (comment) (TBD)    Recommendations for Other Services       Precautions / Restrictions Precautions Precautions: Fall Precaution Comments: N/V, chemo precautions Restrictions Weight Bearing Restrictions: No      Mobility Bed Mobility Overal bed mobility: Needs Assistance Bed Mobility: Supine to Sit     Supine to sit: Mod assist     General bed mobility comments: patient asking for UE support to pull to sitting.    Transfers                          Balance Overall balance assessment: Needs assistance Sitting-balance support: No upper extremity supported, Feet supported Sitting balance-Leahy Scale: Fair     Standing balance support: Bilateral upper extremity supported, During functional activity, Reliant on assistive device for balance Standing balance-Leahy Scale: Poor                             ADL either performed or assessed with clinical judgement   ADL Overall ADL's : Needs assistance/impaired Eating/Feeding: Set up;Sitting Eating/Feeding Details (indicate cue type and reason): in recliner still on restricted diet Grooming: Set up;Sitting   Upper Body Bathing: Minimal assistance;Sitting   Lower Body Bathing: Moderate assistance;Sit to/from stand;Sitting/lateral leans   Upper Body Dressing : Minimal assistance;Sitting   Lower Body Dressing: Sit to/from stand;Sitting/lateral leans;Maximal assistance Lower Body Dressing Details (indicate cue type and reason): patient was able to doff R sock with increased time with increased effort and  patient was noted to need long rest brak and mod A to don sock afterwards. patient was educated on AE with patient telling therapist to tell his wife about it. Toilet Transfer:  Minimal assistance;+2 for safety/equipment;+2 for physical assistance;Rolling walker (2 wheels) Toilet Transfer Details (indicate cue type and reason): transfer from edge of bed to recliner in room with increased time and noted kyphotic posture on RW> Toileting- Clothing Manipulation and Hygiene: Maximal assistance;Sit to/from stand       Functional mobility during ADLs: Minimal assistance;+2 for safety/equipment;+2 for physical assistance       Vision Patient Visual Report: No change from baseline       Perception     Praxis      Pertinent Vitals/Pain Pain Assessment Pain Assessment: Faces Faces Pain Scale: Hurts little more Pain Location: "gut" Pain Descriptors / Indicators: Discomfort Pain Intervention(s): Monitored during session, Premedicated before session     Hand Dominance Right   Extremity/Trunk Assessment Upper Extremity Assessment Upper Extremity Assessment: Overall WFL for tasks assessed;Generalized weakness;LUE deficits/detail;RUE deficits/detail RUE Deficits / Details: able to lift to about 90 degrees abduction AROM. patient reproted having more weakenss in this UE, unable to test MMT formally but able to push through UE on RW during session and pull up on grab bar in bed LUE Deficits / Details: WLF AROM abduction, FF and external rotation with arm above head at start of eval   Lower Extremity Assessment Lower Extremity Assessment: Defer to PT evaluation   Cervical / Trunk Assessment Cervical / Trunk Assessment: Kyphotic   Communication Communication Communication: No difficulties   Cognition Arousal/Alertness: Awake/alert Behavior During Therapy: WFL for tasks assessed/performed, Flat affect Overall Cognitive Status: Within Functional Limits for tasks assessed                                 General Comments: patient made miminal conversation with noted deferring to wife at times during session     General Comments       Exercises      Shoulder Instructions      Home Living Family/patient expects to be discharged to:: Private residence Living Arrangements: Spouse/significant other Available Help at Discharge: Family;Friend(s);Available 24 hours/day Type of Home: House Home Access: Stairs to enter [employer] of Steps: 2 with a wall   Home Layout: One level     Bathroom Shower/Tub: Teacher, early years/pre: Standard Bathroom Accessibility: Yes   Home Equipment: Conservation officer, nature (2 wheels)          Prior Functioning/Environment Prior Level of Function : Needs assist             Mobility Comments: was ambulatory without device until just before this admission.          OT Problem List: Decreased activity tolerance;Impaired balance (sitting and/or standing);Decreased safety awareness;Decreased knowledge of precautions;Decreased knowledge of use of DME or AE      OT Treatment/Interventions: Self-care/ADL training;Therapeutic exercise;Neuromuscular education;Energy conservation;DME and/or AE instruction;Therapeutic activities;Balance training;Patient/family education    OT Goals(Current goals can be found in the care plan section) Acute Rehab OT Goals Patient Stated Goal: to get back in bed OT Goal Formulation: With patient Time For Goal Achievement: 05-31-22 Potential to Achieve Goals: Good  OT Frequency: Min 2X/week    Co-evaluation PT/OT/SLP Co-Evaluation/Treatment: Yes Reason for Co-Treatment: For patient/therapist safety;To address functional/ADL transfers PT goals addressed during session: Mobility/safety with mobility OT goals addressed during  session: ADL's and self-care      AM-PAC OT "6 Clicks" Daily Activity     Outcome Measure Help from another person eating meals?: A Little Help from another person taking care of personal grooming?: A Little Help from another person toileting, which includes using toliet, bedpan, or urinal?: A Lot Help from another person  bathing (including washing, rinsing, drying)?: A Lot Help from another person to put on and taking off regular upper body clothing?: A Lot Help from another person to put on and taking off regular lower body clothing?: A Lot 6 Click Score: 14   End of Session Equipment Utilized During Treatment: Rolling walker (2 wheels) Nurse Communication: Mobility status  Activity Tolerance: Patient tolerated treatment well Patient left: in chair;with call bell/phone within reach;with family/visitor present  OT Visit Diagnosis: Unsteadiness on feet (R26.81);Other abnormalities of gait and mobility (R26.89);Muscle weakness (generalized) (M62.81)                Time: 8003-4917 OT Time Calculation (min): 15 min Charges:  OT General Charges $OT Visit: 1 Visit OT Evaluation $OT Eval Moderate Complexity: 1 Mod  Jackelyn Poling OTR/L, MS Acute Rehabilitation Department Office# [phone] Pager# [phone]   Zachary Bowers 05/08/2022, 1:22 PM

## 2022-05-09 DIAGNOSIS — Z5111 Encounter for antineoplastic chemotherapy: Secondary | ICD-10-CM | POA: Diagnosis not present

## 2022-05-09 DIAGNOSIS — N179 Acute kidney failure, unspecified: Secondary | ICD-10-CM | POA: Diagnosis not present

## 2022-05-09 DIAGNOSIS — C160 Malignant neoplasm of cardia: Secondary | ICD-10-CM | POA: Diagnosis not present

## 2022-05-09 DIAGNOSIS — R7401 Elevation of levels of liver transaminase levels: Secondary | ICD-10-CM | POA: Diagnosis not present

## 2022-05-09 DIAGNOSIS — R112 Nausea with vomiting, unspecified: Secondary | ICD-10-CM | POA: Diagnosis not present

## 2022-05-09 LAB — RETICULOCYTES
Immature Retic Fract: 31.3 % — ABNORMAL HIGH (ref 2.3–15.9)
RBC.: 3.24 MIL/uL — ABNORMAL LOW (ref 4.22–5.81)
Retic Count, Absolute: 74.8 10*3/uL (ref 19.0–186.0)
Retic Ct Pct: 2.3 % (ref 0.4–3.1)

## 2022-05-09 LAB — COMPREHENSIVE METABOLIC PANEL
ALT: 263 U/L — ABNORMAL HIGH (ref 0–44)
AST: 699 U/L — ABNORMAL HIGH (ref 15–41)
Albumin: 2.2 g/dL — ABNORMAL LOW (ref 3.5–5.0)
Alkaline Phosphatase: 690 U/L — ABNORMAL HIGH (ref 38–126)
Anion gap: 10 (ref 5–15)
BUN: 55 mg/dL — ABNORMAL HIGH (ref 8–23)
CO2: 22 mmol/L (ref 22–32)
Calcium: 8 mg/dL — ABNORMAL LOW (ref 8.9–10.3)
Chloride: 100 mmol/L (ref 98–111)
Creatinine, Ser: 1.27 mg/dL — ABNORMAL HIGH (ref 0.61–1.24)
GFR, Estimated: >60 mL/min (ref >60)
Glucose, Bld: 209 mg/dL — ABNORMAL HIGH (ref 70–99)
Potassium: 3.6 mmol/L (ref 3.5–5.1)
Sodium: 132 mmol/L — ABNORMAL LOW (ref 135–145)
Total Bilirubin: 1.6 mg/dL — ABNORMAL HIGH (ref 0.3–1.2)
Total Protein: 6 g/dL — ABNORMAL LOW (ref 6.5–8.1)

## 2022-05-09 LAB — CBC WITH DIFFERENTIAL/PLATELET
Abs Immature Granulocytes: 0.06 10*3/uL (ref 0.00–0.07)
Basophils Absolute: 0 10*3/uL (ref 0.0–0.1)
Basophils Relative: 0 %
Eosinophils Absolute: 0 10*3/uL (ref 0.0–0.5)
Eosinophils Relative: 0 %
HCT: 26.5 % — ABNORMAL LOW (ref 39.0–52.0)
Hemoglobin: 8.5 g/dL — ABNORMAL LOW (ref 13.0–17.0)
Immature Granulocytes: 1 %
Lymphocytes Relative: 5 %
Lymphs Abs: 0.4 10*3/uL — ABNORMAL LOW (ref 0.7–4.0)
MCH: 25.9 pg — ABNORMAL LOW (ref 26.0–34.0)
MCHC: 32.1 g/dL (ref 30.0–36.0)
MCV: 80.8 fL (ref 80.0–100.0)
Monocytes Absolute: 0.3 10*3/uL (ref 0.1–1.0)
Monocytes Relative: 4 %
Neutro Abs: 7.8 10*3/uL — ABNORMAL HIGH (ref 1.7–7.7)
Neutrophils Relative %: 90 %
Platelets: 317 10*3/uL (ref 150–400)
RBC: 3.28 MIL/uL — ABNORMAL LOW (ref 4.22–5.81)
RDW: 16.6 % — ABNORMAL HIGH (ref 11.5–15.5)
WBC: 8.5 10*3/uL (ref 4.0–10.5)
nRBC: 0 % (ref 0.0–0.2)

## 2022-05-09 LAB — FERRITIN: Ferritin: 308 ng/mL (ref 24–336)

## 2022-05-09 LAB — GLUCOSE, CAPILLARY
Glucose-Capillary: 164 mg/dL — ABNORMAL HIGH (ref 70–99)
Glucose-Capillary: 172 mg/dL — ABNORMAL HIGH (ref 70–99)
Glucose-Capillary: 172 mg/dL — ABNORMAL HIGH (ref 70–99)
Glucose-Capillary: 199 mg/dL — ABNORMAL HIGH (ref 70–99)
Glucose-Capillary: 218 mg/dL — ABNORMAL HIGH (ref 70–99)
Glucose-Capillary: 225 mg/dL — ABNORMAL HIGH (ref 70–99)

## 2022-05-09 LAB — IRON AND TIBC
Iron: 35 ug/dL — ABNORMAL LOW (ref 45–182)
Saturation Ratios: 18 % (ref 17.9–39.5)
TIBC: 190 ug/dL — ABNORMAL LOW (ref 250–450)
UIBC: 155 ug/dL

## 2022-05-09 LAB — FOLATE: Folate: >40 ng/mL (ref >5.9)

## 2022-05-09 LAB — VITAMIN B12: Vitamin B-12: 1947 pg/mL — ABNORMAL HIGH (ref 180–914)

## 2022-05-09 MED ORDER — LACTATED RINGERS IV BOLUS
500.0000 mL | Freq: Once | INTRAVENOUS | Status: AC
  Administered 2022-05-09: 500 mL via INTRAVENOUS

## 2022-05-09 MED ORDER — INSULIN GLARGINE-YFGN 100 UNIT/ML ~~LOC~~ SOLN
5.0000 [IU] | Freq: Every day | SUBCUTANEOUS | Status: DC
  Administered 2022-05-09 – 2022-05-20 (×12): 5 [IU] via SUBCUTANEOUS
  Filled 2022-05-09 (×12): qty 0.05

## 2022-05-09 MED ORDER — LACTATED RINGERS IV SOLN: INTRAVENOUS | Status: AC

## 2022-05-09 MED ORDER — INSULIN ASPART 100 UNIT/ML IJ SOLN
0.0000 [IU] | INTRAMUSCULAR | Status: DC
  Administered 2022-05-09 – 2022-05-10 (×7): 3 [IU] via SUBCUTANEOUS
  Administered 2022-05-10: 2 [IU] via SUBCUTANEOUS
  Administered 2022-05-10 – 2022-05-11 (×3): 3 [IU] via SUBCUTANEOUS
  Administered 2022-05-11: 2 [IU] via SUBCUTANEOUS
  Administered 2022-05-11: 3 [IU] via SUBCUTANEOUS
  Administered 2022-05-11: 2 [IU] via SUBCUTANEOUS
  Administered 2022-05-12 – 2022-05-13 (×4): 3 [IU] via SUBCUTANEOUS
  Administered 2022-05-13 – 2022-05-14 (×4): 2 [IU] via SUBCUTANEOUS
  Administered 2022-05-14: 3 [IU] via SUBCUTANEOUS
  Administered 2022-05-14 (×2): 2 [IU] via SUBCUTANEOUS
  Administered 2022-05-14 – 2022-05-15 (×3): 3 [IU] via SUBCUTANEOUS
  Administered 2022-05-15: 5 [IU] via SUBCUTANEOUS
  Administered 2022-05-15 (×2): 3 [IU] via SUBCUTANEOUS
  Administered 2022-05-15 (×2): 2 [IU] via SUBCUTANEOUS
  Administered 2022-05-16 (×3): 5 [IU] via SUBCUTANEOUS
  Administered 2022-05-16: 3 [IU] via SUBCUTANEOUS
  Administered 2022-05-16: 5 [IU] via SUBCUTANEOUS
  Administered 2022-05-17: 3 [IU] via SUBCUTANEOUS
  Administered 2022-05-17: 2 [IU] via SUBCUTANEOUS
  Administered 2022-05-17 (×2): 5 [IU] via SUBCUTANEOUS
  Administered 2022-05-17: 3 [IU] via SUBCUTANEOUS
  Administered 2022-05-17: 5 [IU] via SUBCUTANEOUS
  Administered 2022-05-18: 3 [IU] via SUBCUTANEOUS
  Administered 2022-05-18: 5 [IU] via SUBCUTANEOUS
  Administered 2022-05-18: 3 [IU] via SUBCUTANEOUS
  Administered 2022-05-18: 5 [IU] via SUBCUTANEOUS
  Administered 2022-05-18 (×2): 3 [IU] via SUBCUTANEOUS
  Administered 2022-05-19: 8 [IU] via SUBCUTANEOUS
  Administered 2022-05-19: 5 [IU] via SUBCUTANEOUS
  Administered 2022-05-19: 3 [IU] via SUBCUTANEOUS
  Administered 2022-05-19: 5 [IU] via SUBCUTANEOUS
  Administered 2022-05-19 (×2): 3 [IU] via SUBCUTANEOUS
  Administered 2022-05-20 (×2): 5 [IU] via SUBCUTANEOUS
  Administered 2022-05-20 (×2): 3 [IU] via SUBCUTANEOUS
  Administered 2022-05-20: 2 [IU] via SUBCUTANEOUS
  Administered 2022-05-20: 3 [IU] via SUBCUTANEOUS
  Administered 2022-05-21 (×2): 5 [IU] via SUBCUTANEOUS
  Administered 2022-05-21: 3 [IU] via SUBCUTANEOUS

## 2022-05-09 NOTE — Progress Notes (Signed)
HEMATOLOGY-ONCOLOGY PROGRESS NOTE  Date of service .05/09/2022.  Subjective  Patient seen in follow-up for his metastatic gastric cancer.  He is on cycle 1 day 3 of chemotherapy today.  Notes that he is feeling a little better than yesterday with decreased nausea and better controlled pain.  He did have a bowel movement last evening which made him feel better. Tolerating tube feeding with intermittently having to hold it due to nausea. No fevers or chills.  No other acute new symptoms.  No bleeding issues. Labs today discussed with the patient..   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)   03/04/2022 Cancer Staging   Staging form: Stomach, AJCC 8th Edition - Pathologic: Stage IIA (pT2, pN1, cM0) - Signed by Ladell Pier, MD on 03/04/2022 Histologic grade (G): G2 Histologic grading system: 3 grade system Residual tumor (R): R0 - None   04/02/2022 -  Chemotherapy   Patient is on Treatment Plan : HEAD/NECK Nivolumab q14d       PHYSICAL EXAMINATION:  Vitals:   05/08/22 1939 05/09/22 0421  BP: 122/79 117/74  Pulse: (!) 108 (!) 110  Resp: 18 18  Temp: 98.6 F (37 C) 98.4 F (36.9 C)  SpO2: 95% 95%   Filed Weights   04/23/2022 1618 05/08/22 0407  Weight: 170 lb (77.1 kg) 186 lb 1.1 oz (84.4 kg)    Intake/Output from previous day: 05/27 0701 - 05/28  0700 In: 1904.5 [I.V.:1376.5; NG/GT:528] Out: 275 [Urine:275] . Marland Kitchen OROPHARYNX: MMM NECK: supple, no JVD LYMPH:  no palpable lymphadenopathy in the cervical, axillary or inguinal regions LUNGS: clear to auscultation b/l with normal respiratory effort HEART: regular rate & rhythm ABDOMEN:  normoactive bowel sounds , non tender, not distended. Extremity: no pedal edema PSYCH: alert & oriented x 3 with fluent speech NEURO: no focal motor/sensory deficits   LABORATORY DATA:  I have reviewed the data as listed    Latest Ref Rng & Units 05/09/2022    4:43 AM 05/08/2022    5:44 AM 05/07/2022    4:25 AM  CMP  Glucose 70 - 99 mg/dL 209   154   106    BUN 8 - 23 mg/dL 55   34   24    Creatinine 0.61 - 1.24 mg/dL 1.27   1.10   0.99    Sodium 135 - 145 mmol/L 132   132   136    Potassium 3.5 - 5.1 mmol/L 3.6   3.9   4.1    Chloride 98 - 111 mmol/L 100   99   101    CO2 22 - 32 mmol/L _0 Calcium 8.9 - 10.3 mg/dL 8.0   8.3   8.8    Total Protein 6.5 - 8.1 g/dL 6.0   5.9  6.5    Total Bilirubin 0.3 - 1.2 mg/dL 1.6   1.7   2.5    Alkaline Phos 38 - 126 U/L 690   513   597    AST 15 - 41 U/L 699   796   610    ALT 0 - 44 U/L 263   258   185     .    Latest Ref Rng & Units 05/09/2022    4:43 AM 05/08/2022    5:44 AM 05/07/2022    4:25 AM  CBC  WBC 4.0 - 10.5 K/uL 8.5   8.9   7.7    Hemoglobin 13.0 - 17.0 g/dL 8.5   8.2   9.6    Hematocrit 39.0 - 52.0 % 26.5   26.2   29.6    Platelets 150 - 400 K/uL 317   300   368      Lab Results  Component Value Date   CEA 1.50 10/30/2021   PSA1 2.7 10/30/2021    CT ABDOMEN PELVIS W CONTRAST  Result Date: 04/25/2022 CLINICAL DATA:  Weakness, elevated LFTs, history of gastric cancer status post gastrectomy EXAM: CT ABDOMEN AND PELVIS WITH CONTRAST TECHNIQUE: Multidetector CT imaging of the abdomen and pelvis was performed using the standard protocol following bolus administration of intravenous contrast. RADIATION DOSE REDUCTION: This  exam was performed according to the departmental dose-optimization program which includes automated exposure control, adjustment of the mA and/or kV according to patient size and/or use of iterative reconstruction technique. CONTRAST:  10m OMNIPAQUE IOHEXOL 300 MG/ML  SOLN COMPARISON:  PET-CT dated 01/18/2022 FINDINGS: Lower chest: Trace bilateral pleural effusions, right greater than left. Associated right lower lobe atelectasis. Hepatobiliary: Innumerable hepatic metastases in both lobes, new. Dominant aggregate lesion measures 8.8 cm in the lateral segment left hepatic lobe (series 2/image 25). Index lesion in the inferior right hepatic lobe measures 4.8 cm (series 2/image 50). Gallbladder is notable for layering sludge (series 2/image 52), without associated inflammatory changes. No intrahepatic or extrahepatic duct dilatation. Pancreas: Within normal limits. Spleen: Within normal limits. Adrenals/Urinary Tract: Adrenal glands are within normal limits. Kidneys are within normal limits.  No hydronephrosis. Bladder is mildly thick-walled although underdistended. Stomach/Bowel: Status post gastrectomy. Percutaneous jejunostomy.  No evidence of bowel obstruction. Appendix is not discretely visualized, reportedly surgically absent. No colonic wall thickening or mass is evident on CT. Vascular/Lymphatic: No evidence of abdominal aortic aneurysm. 14 mm short axis node in the portacaval region (series 2/image 33). Reproductive: Prostate is unremarkable. Other: Moderate abdominopelvic ascites, minimally complex. Musculoskeletal: Mild degenerative changes of the lumbar spine. IMPRESSION: Status post gastrectomy with percutaneous jejunostomy. Innumerable hepatic metastases, new, with index lesions as above. 14 mm short axis portacaval node, suspicious for nodal metastasis. Moderate abdominopelvic ascites. Trace bilateral pleural effusions. Electronically Signed   By: SJulian HyM.D.   On: 05/08/2022 19:28   [place] EKG SITE RITE  Result Date: 05/06/2022 If Site Rite image not attached, placement could not be confirmed due to current cardiac rhythm.    No future appointments.   ASSESSMENT AND PLAN: Gastric cancer CT abdomen/pelvis 10/08/2021-probable mass in the proximal stomach, enlarged gastropathic ligament node Upper endoscopy 10/01/2021-gastric cardia/fundus mass with oozing and stigmata of recent bleeding-biopsy at least intramucosal adenocarcinoma, mismatch repair protein expression intact, PD-L1 combined positive score 7%, HER2 positive PET 10/20/2021-hypermetabolic proximal gastric primary, or hypermetabolic gastropathic ligament node, right paramidline prostatic hypermetabolism, anal hypermetabolism without a CT mass Radiation 11/11/2021-12/22/2021 Cycle 1  Taxol/carboplatin  11/12/2021 Cycle 2 Taxol/carboplatin 11/19/2021 Cycle 3 Taxol/carboplatin 11/26/2021 Cycle 4 Taxol/carboplatin 12/03/2021 Cycle 5 Taxol/carboplatin 12/18/2021 PET 01/19/2022-decreased hypermetabolism at the gastric cardia, decreased size and metabolic activity associated with an enlarged gastrohepatic node, persistent anal hypermetabolism, stable lateral left lower lobe nodule, possible new more medial subpleural left lower lobe nodule-both nodules below PET resolution 02/11/2022-total gastrectomy with Roux-en-Y esophagojejunostomy, placement of jejunal feeding tube,ypT2,ypN1 moderate to poorly differentiated adenocarcinoma at the GE junction measuring 2.5 x 2.3 cm, lymphovascular invasion present, negative resection margins, 2/28 lymph nodes CT abdomen/pelvis with contrast 05/07/2022-innumerable hepatic metastases which are new, portacaval node suspicious for nodal metastasis.   Microcytic anemia secondary to #1 Diabetes Hypertension Coronary artery disease, cardiac catheterization 08/11/2021-placement of proximal and distal right coronary artery stents Hypothyroidism Sleep apnea Anal hypermetabolism on the PET 10/20/2021-no mass  identified Hospital admission 05/07/2022-nausea and vomiting, transaminitis. Plan -Labs done today reviewed with the patient -Continue current supportive medications for pain control and nausea. -Did have a bowel movement with resolution of constipation. -Still with significant elevated liver function tests. -Dietitian to follow-up to optimize tube feeding. -No other acute new concerns overnight. -Ferritin and B12 within normal limits however these are likely falsely elevated in the setting of transaminitis.  Might benefit from B12 and iron replacement will defer this to his primary oncologist. -Dr. Benay Spice will continue to follow from tomorrow   LOS: 3 days   Sullivan Lone MD No Name

## 2022-05-09 NOTE — Progress Notes (Addendum)
[facility] PROGRESS NOTE   Zachary Bowers QMV:[id number] DOB: [birth year] DOA: 05/04/2022  PCP: Shirline Frees, MD  Brief History/Interval Summary:  66 y.o. male with medical history significant of CAD s/p stent, HTN, HLD, DM2. Pt with recent history of stomach cancer s/p gastric resection and feeding tube.  On chemo therapy due to positive nodes.  Presented with 1 month history of nausea and vomiting with abdominal pain.  Progressed to intractable nausea and vomiting.  Was hospitalized for further management.  CT scan raised concern for hepatic metastases.  Consultants: Medical oncology.  Interventional radiology  Procedures: None yet    Subjective/Interval History: Patient mentions that he is feeling slightly better today compared to the last couple of days.  Continues to have some abdominal discomfort with some nausea at times.  He has been asking the nursing staff to hold his feedings after every few hours.  Says this is because he feels bloated.   Assessment/Plan:  Intractable nausea vomiting in the setting of gastric cancer CT scan did not show any bowel obstruction.  He has jejunostomy tube which appears to be in place as per CT scan.  His nausea and vomiting could be from his new metastatic process noted on the CT imaging study. Some of his discomfort could also be from constipation.  He was given laxatives.  Patient was subsequently started on metoclopramide due to persistent nausea.  Seems to be improved.  Continue current dose for now. Patient was also started on Zosyn at the time of admission due to concern for intra-abdominal infection.  WBCs noted to be normal.  Procalcitonin noted to be elevated.  Patient noted to be afebrile.  LFTs are abnormal but stable.  No ductal dilatation noted on CT scan.  Blood cultures negative so far.  There are likely other reasons for why his procalcitonin is elevated.  No obvious infectious source has been found.  Zosyn was discontinued  on 5/27.  Remains afebrile.  Continue to monitor.  Gastric cancer with concern for hepatic metastases The lesions in the liver are new compared to previous imaging study.  Patient followed by Dr. Benay Spice with medical oncology.   Patient has received multiple cycles of Taxol/carboplatin previously.  At last oncology visit consideration was being given to adjuvant immunotherapy though he was thought not to be a good candidate for same due to his physical deconditioning and weight loss. Patient seen by medical oncology.  Patient started on chemotherapy with FOLFOX/Herceptin on 5/26.    Acute kidney injury Elevated BUN and creatinine noted today.  Could be from chemotherapy.  Stop other nephrotoxic agents.  We will hold this Avapro.  Give IV fluid bolus.  Recheck labs tomorrow.  Monitor urine output.  Elevated BUN could be from tube feedings.  Status post jejunostomy No obstruction noted on imaging studies.  Continue tube feedings as tolerated.  Nutritionist is following.  Trickle feed.  Laxatives have been ordered.  Started on metoclopramide yesterday.  Transaminitis Likely secondary to metastases in the liver noted on imaging studies.  Bilirubin is mildly elevated.  No biliary ductal dilatation noted on CT scan.  Images were reviewed by gastroenterology.  No role for biliary stenting at this time. LFTs remain persistently elevated.  Continue to monitor.  Diabetes mellitus type 2 HbA1c was 6.6 in March.  Elevated CBGs likely due to tube feedings.  Will increase the SSI coverage.  Add low-dose glargine at bedtime.  Normocytic anemia Likely due to malignancy.  Drop in hemoglobin is likely  dilutional.  Stable this morning.  No overt bleeding.  Continue to monitor.  Anemia panel reviewed.  No clear-cut deficiencies identified.    Essential hypertension/hyponatremia Blood pressure reasonably well controlled.  Noted to be on amlodipine and Avapro.  We will hold Avapro due to rising  creatinine.  Hypothyroidism Continue levothyroxine.  Hyperlipidemia Continue statin.  Physical deconditioning Patient appears to be quite fatigued.  Likely multifactorial but mainly secondary to his metastatic cancer.  Now he is getting chemotherapy.  Seen by physical and occupational therapy.  Home health is recommended.   DVT Prophylaxis: Lovenox Code Status: Full code Family Communication: Discussed with patient. Disposition Plan: Hopefully return home when improved.  Mobilize.  PT and OT evaluation.  Status is: Inpatient Remains inpatient appropriate because: Need for chemotherapy     Medications: Scheduled:  amLODipine  10 mg Per Tube Daily   aspirin  81 mg Per Tube Daily   Chlorhexidine Gluconate Cloth  6 each Topical Daily   enoxaparin (LOVENOX) injection  40 mg Subcutaneous Q24H   feeding supplement (PROSource TF)  90 mL Per Tube TID   feeding supplement (VITAL 1.5 CAL)  1,000 mL Per Tube Q24H   flurouracil CHEMO IVPB infusion  1,800 mg/m2 (Treatment Plan Recorded) Intravenous Once   insulin aspart  0-9 Units Subcutaneous Q4H   irbesartan  150 mg Oral Daily   levothyroxine  125 mcg Per Tube QAC breakfast   metoCLOPramide (REGLAN) injection  5 mg Intravenous Q8H   polyethylene glycol  17 g Per Tube Daily   sennosides  10 mL Per Tube BID   sodium chloride flush  10-40 mL Intracatheter Q12H   Continuous:  sodium chloride     famotidine (PEPCID) IV (ONCOLOGY)     lactated ringers     JGG:EZMOQH chloride, albuterol, alteplase, diphenhydrAMINE, EPINEPHrine, famotidine (PEPCID) IV (ONCOLOGY), heparin lock flush, heparin lock flush, HYDROmorphone, methylPREDNISolone sodium succinate, morphine injection, ondansetron **OR** ondansetron (ZOFRAN) IV, sodium chloride flush, sodium chloride flush, sodium chloride flush  Antibiotics: Anti-infectives (From admission, onward)    Start     Dose/Rate Route Frequency Ordered Stop   05/07/22 1430  piperacillin-tazobactam (ZOSYN)  IVPB 3.375 g        3.375 g 100 mL/hr over 30 Minutes Intravenous  Once 05/07/22 1420 05/07/22 1512   05/01/2022 2015  piperacillin-tazobactam (ZOSYN) IVPB 3.375 g  Status:  Discontinued        3.375 g 12.5 mL/hr over 240 Minutes Intravenous Every 8 hours 04/29/2022 2013 05/08/22 1137       Objective:  Vital Signs  Vitals:   05/08/22 0407 05/08/22 1327 05/08/22 1939 05/09/22 0421  BP:  116/71 122/79 117/74  Pulse:  96 (!) 108 (!) 110  Resp:  '15 18 18  '$ Temp:  97.8 F (36.6 C) 98.6 F (37 C) 98.4 F (36.9 C)  TempSrc:  Oral Oral Oral  SpO2:  96% 95% 95%  Weight: 84.4 kg     Height:        Intake/Output Summary (Last 24 hours) at 05/09/2022 0857 Last data filed at 05/09/2022 0732 Gross per 24 hour  Intake 1904.5 ml  Output 475 ml  Net 1429.5 ml    Filed Weights   05/06/2022 1618 05/08/22 0407  Weight: 77.1 kg 84.4 kg    General appearance: Awake alert.  In no distress Resp: Clear to auscultation bilaterally.  Normal effort Cardio: S1-S2 is normal regular.  No S3-S4.  No rubs murmurs or bruit GI: Abdomen is soft.  Mildly  tender diffusely without any rebound rigidity or guarding.  Main tenderness is in the right upper quadrant.  Liver edge is palpable.  PEG tube is noted. Extremities: No edema.  Full range of motion of lower extremities. Neurologic: Alert and oriented x3.  No focal neurological deficits.     Lab Results:  Data Reviewed: I have personally reviewed following labs and reports of the imaging studies  CBC: Recent Labs  Lab 05/02/2022 1434 04/28/2022 1838 05/06/22 0542 05/07/22 0425 05/08/22 0544 05/09/22 0443  WBC 7.2 7.9 7.8 7.7 8.9 8.5  NEUTROABS 5.4 6.7  --   --   --  7.8*  HGB 11.8* 11.5* 10.7* 9.6* 8.2* 8.5*  HCT 38.0* 36.6* 33.2* 29.6* 26.2* 26.5*  MCV 79.8* 80.1 80.6 81.3 81.4 80.8  PLT 488* 459* 326 368 300 317     Basic Metabolic Panel: Recent Labs  Lab 04/30/2022 1838 05/06/22 0636 05/06/22 1743 05/07/22 0425 05/07/22 1700  05/08/22 0544 05/09/22 0443  NA 134* 138  --  136  --  132* 132*  K 4.8 4.2  --  4.1  --  3.9 3.6  CL 94* 100  --  101  --  99 100  CO2 29 27  --  25  --  24 22  GLUCOSE 124* 90  --  106*  --  154* 209*  BUN 23 23  --  24*  --  34* 55*  CREATININE 1.01 1.07  --  0.99  --  1.10 1.27*  CALCIUM 9.9 9.4  --  8.8*  --  8.3* 8.0*  MG  --  2.4 2.2 2.3 1.8  --   --   PHOS  --  3.8 3.4 3.4 2.9  --   --      GFR: Estimated Creatinine Clearance: 62.8 mL/min (A) (by C-G formula based on SCr of 1.27 mg/dL (H)).  Liver Function Tests: Recent Labs  Lab 05/07/2022 1838 05/06/22 0636 05/07/22 0425 05/08/22 0544 05/09/22 0443  AST 513* 520* 610* 796* 699*  ALT 162* 156* 185* 258* 263*  ALKPHOS 794* 659* 597* 513* 690*  BILITOT 2.4* 2.1* 2.5* 1.7* 1.6*  PROT 8.3* 7.3 6.5 5.9* 6.0*  ALBUMIN 3.8 3.0* 2.7* 2.2* 2.2*     Recent Labs  Lab 05/02/2022 1838  LIPASE 57*    Recent Labs  Lab 04/14/2022 2213  AMMONIA 25     Coagulation Profile: Recent Labs  Lab 04/24/2022 1838  INR 1.0     CBG: Recent Labs  Lab 05/08/22 1541 05/08/22 1941 05/09/22 0003 05/09/22 0418 05/09/22 0729  GLUCAP 208* 164* 199* 225* 218*      Radiology Studies: No results found.     LOS: 3 days   Rhonda Vangieson [employer] on [email or web address]  05/09/2022, 8:57 AM

## 2022-05-10 DIAGNOSIS — R7401 Elevation of levels of liver transaminase levels: Secondary | ICD-10-CM | POA: Diagnosis not present

## 2022-05-10 DIAGNOSIS — C160 Malignant neoplasm of cardia: Secondary | ICD-10-CM | POA: Diagnosis not present

## 2022-05-10 DIAGNOSIS — N179 Acute kidney failure, unspecified: Secondary | ICD-10-CM | POA: Diagnosis not present

## 2022-05-10 LAB — GLUCOSE, CAPILLARY
Glucose-Capillary: 127 mg/dL — ABNORMAL HIGH (ref 70–99)
Glucose-Capillary: 143 mg/dL — ABNORMAL HIGH (ref 70–99)
Glucose-Capillary: 160 mg/dL — ABNORMAL HIGH (ref 70–99)
Glucose-Capillary: 161 mg/dL — ABNORMAL HIGH (ref 70–99)
Glucose-Capillary: 170 mg/dL — ABNORMAL HIGH (ref 70–99)
Glucose-Capillary: 174 mg/dL — ABNORMAL HIGH (ref 70–99)
Glucose-Capillary: 194 mg/dL — ABNORMAL HIGH (ref 70–99)

## 2022-05-10 LAB — COMPREHENSIVE METABOLIC PANEL
ALT: 245 U/L — ABNORMAL HIGH (ref 0–44)
AST: 542 U/L — ABNORMAL HIGH (ref 15–41)
Albumin: 2.2 g/dL — ABNORMAL LOW (ref 3.5–5.0)
Alkaline Phosphatase: 733 U/L — ABNORMAL HIGH (ref 38–126)
Anion gap: 9 (ref 5–15)
BUN: 60 mg/dL — ABNORMAL HIGH (ref 8–23)
CO2: 24 mmol/L (ref 22–32)
Calcium: 8.6 mg/dL — ABNORMAL LOW (ref 8.9–10.3)
Chloride: 105 mmol/L (ref 98–111)
Creatinine, Ser: 1.23 mg/dL (ref 0.61–1.24)
GFR, Estimated: >60 mL/min (ref >60)
Glucose, Bld: 169 mg/dL — ABNORMAL HIGH (ref 70–99)
Potassium: 3.8 mmol/L (ref 3.5–5.1)
Sodium: 138 mmol/L (ref 135–145)
Total Bilirubin: 1.9 mg/dL — ABNORMAL HIGH (ref 0.3–1.2)
Total Protein: 6.3 g/dL — ABNORMAL LOW (ref 6.5–8.1)

## 2022-05-10 LAB — CULTURE, BLOOD (ROUTINE X 2)
Culture: NO GROWTH
Culture: NO GROWTH
Special Requests: ADEQUATE

## 2022-05-10 LAB — CBC
HCT: 26.1 % — ABNORMAL LOW (ref 39.0–52.0)
Hemoglobin: 8.6 g/dL — ABNORMAL LOW (ref 13.0–17.0)
MCH: 26.4 pg (ref 26.0–34.0)
MCHC: 33 g/dL (ref 30.0–36.0)
MCV: 80.1 fL (ref 80.0–100.0)
Platelets: 285 10*3/uL (ref 150–400)
RBC: 3.26 MIL/uL — ABNORMAL LOW (ref 4.22–5.81)
RDW: 16.8 % — ABNORMAL HIGH (ref 11.5–15.5)
WBC: 7.8 10*3/uL (ref 4.0–10.5)
nRBC: 0 % (ref 0.0–0.2)

## 2022-05-10 MED ORDER — PROCHLORPERAZINE EDISYLATE 10 MG/2ML IJ SOLN
5.0000 mg | Freq: Once | INTRAMUSCULAR | Status: AC
  Administered 2022-05-10: 5 mg via INTRAVENOUS
  Filled 2022-05-10: qty 2

## 2022-05-10 MED ORDER — LACTATED RINGERS IV SOLN: INTRAVENOUS | Status: AC

## 2022-05-10 NOTE — TOC Progression Note (Signed)
Transition of Care [facility]) - Progression Note   Patient Details  Name: Zachary Bowers MRN: [id number] Date of Birth: 1956/06/22  Transition [facility]) CM/SW [place], LCSW Phone Number: 05/10/2022, 11:46 AM  Clinical Narrative: PT evaluation recommendations changed from [facility] to SNF. CSW spoke with wife, who is with patient is his room, regarding the change in recommendations. Per wife, the family prefers for the patient to discharge home with [facility] rather than going to rehab as wife lives with the patient in the home and children live on the same street and are very involved in the patient's care. Patient has used Nurse, learning disability for [facility] before, but wife wants to discuss [facility] agencies with the family prior to choosing one to provide PT and OT.  Wife reported patient was on tube feedings prior to his admission and has adequate supplies at home following discharge. Wife is requesting a rolling walker for home use rather than a wheelchair and is agreeable to referral to [employer] for DME. CSW made referral for rolling walker to [place] with [employer]. [employer] to deliver walker to patient's room.  Expected Discharge Plan: [place] Barriers to Discharge: Continued Medical Work up  Expected Discharge Plan and Services Expected Discharge Plan: [place] In-house Referral: Clinical Social Work Post Acute Care Choice: Durable Medical Equipment, [place] arrangements for the past 2 months: Single Family Home            DME Arranged: Walker rolling DME Agency: [employer] Date DME Agency Contacted: 05/10/22 Time DME Agency Contacted: 1137 Representative spoke with at DME Agency: [place]  Readmission Risk Interventions    05/10/2022   11:43 AM  Readmission Risk Prevention Plan  HRI or Home Care Consult Complete  Social Work Consult for [place] Planning/Counseling Complete  Palliative Care Screening Not Applicable

## 2022-05-10 NOTE — Progress Notes (Signed)
HEMATOLOGY-ONCOLOGY PROGRESS NOTE  ASSESSMENT AND PLAN: Gastric cancer CT abdomen/pelvis 10/08/2021-probable mass in the proximal stomach, enlarged gastropathic ligament node Upper endoscopy 10/01/2021-gastric cardia/fundus mass with oozing and stigmata of recent bleeding-biopsy at least intramucosal adenocarcinoma, mismatch repair protein expression intact, PD-L1 combined positive score 7%, HER2 positive PET 10/20/2021-hypermetabolic proximal gastric primary, or hypermetabolic gastropathic ligament node, right paramidline prostatic hypermetabolism, anal hypermetabolism without a CT mass Radiation 11/11/2021-12/22/2021 Cycle 1  Taxol/carboplatin 11/12/2021 Cycle 2 Taxol/carboplatin 11/19/2021 Cycle 3 Taxol/carboplatin 11/26/2021 Cycle 4 Taxol/carboplatin 12/03/2021 Cycle 5 Taxol/carboplatin 12/18/2021 PET 01/19/2022-decreased hypermetabolism at the gastric cardia, decreased size and metabolic activity associated with an enlarged gastrohepatic node, persistent anal hypermetabolism, stable lateral left lower lobe nodule, possible new more medial subpleural left lower lobe nodule-both nodules below PET resolution 02/11/2022-total gastrectomy with Roux-en-Y esophagojejunostomy, placement of jejunal feeding tube,ypT2,ypN1 moderate to poorly differentiated adenocarcinoma at the GE junction measuring 2.5 x 2.3 cm, lymphovascular invasion present, negative resection margins, 2/28 lymph nodes CT abdomen/pelvis with contrast 04/14/2022-innumerable hepatic metastases which are new, portacaval node suspicious for nodal metastasis.   Microcytic anemia secondary to #1 Diabetes Hypertension Coronary artery disease, cardiac catheterization 08/11/2021-placement of proximal and distal right coronary artery stents Hypothyroidism Sleep apnea Anal hypermetabolism on the PET 10/20/2021-no mass identified Hospital admission 04/24/2022-nausea and vomiting, transaminitis.  Zachary Bowers is now at day 4 following cycle 1  FOLFOX/trastuzumab.  He appears to have tolerated the chemotherapy without significant acute toxicity.  The elevated liver enzymes remain stable.  I encouraged him to increase his oral intake and activity level as tolerated.  I will hold MiraLAX with the report of diarrhea.  He is stable for discharge from an oncology standpoint when he is able to ambulate and tolerate feedings.  Recommendations: 1.  Hold MiraLAX 2.  Try oral Dilaudid for pain 3.  Increase time out of bed, oral diet as tolerated 4.  Continue tube feedings 5.  Outpatient follow-up at the Cancer center for cycle 2 FOLFOX/Herceptin will be scheduled for 05/24/2022  Betsy Coder, MD      SUBJECTIVE: Zachary Bowers reports diarrhea.  No cold sensitivity.  Mild nausea. 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[place])   10/28/2021 Cancer Staging   Staging form: Stomach, AJCC 8th Edition - Clinical: Stage Unknown (cTX, cN1, cM0) - Signed by Ladell Pier, MD on 10/28/2021   11/12/2021 - 12/18/2021 Chemotherapy   Patient is on Treatment Plan : ESOPHAGUS Carboplatin/PACLitaxel weekly x 6 weeks with XRT       05/07/2022 -  Chemotherapy   Patient is on Treatment Plan : GASTROESOPHAGEAL FOLFOX D1,15 + Trastuzumab (6/4) D1,15 + Pembrolizumab (200) every 3 weeks, q28d     Gastric cancer ([place])  02/11/2022 Initial Diagnosis   Gastric cancer ([place])   03/04/2022 Cancer Staging   Staging form: Stomach, AJCC 8th Edition - Pathologic: Stage IIA (pT2, pN1, cM0) - Signed by Ladell Pier, MD on 03/04/2022 Histologic grade (G): G2 Histologic grading system: 3 grade system Residual tumor (R): R0 - None   04/02/2022 -  Chemotherapy   Patient is on Treatment Plan : HEAD/NECK Nivolumab q14d       PHYSICAL EXAMINATION:  Vitals:   05/10/22 0023 05/10/22 0408  BP: 129/82 124/83  Pulse: (!) 119 (!) 117  Resp: 18 18  Temp: 98.3 F (36.8 C)  97.9 F (36.6 C)  SpO2: 95% 94%   Filed Weights   04/13/2022 1618  05/08/22 0407  Weight: 170 lb (77.1 kg) 186 lb 1.1 oz (84.4 kg)    Intake/Output from previous day: 05/28 0701 - 05/29 0700 In: 2961.4 [P.O.:571; I.V.:2390.4] Out: 600 [Urine:600]  Physical exam: HEENT-no thrush Lungs-clear anteriorly Cardiac-regular rate and rhythm Abdomen-the liver is palpable in the right upper abdomen, left upper quadrant feeding tube Vascular-no leg edema  LABORATORY DATA:  I have reviewed the data as listed    Latest Ref Rng & Units 05/10/2022    4:36 AM 05/09/2022    4:43 AM 05/08/2022    5:44 AM  CMP  Glucose 70 - 99 mg/dL 169   209   154    BUN 8 - 23 mg/dL 60   55   34    Creatinine 0.61 - 1.24 mg/dL 1.23   1.27   1.10    Sodium 135 - 145 mmol/L 138   132   132    Potassium 3.5 - 5.1 mmol/L 3.8   3.6   3.9    Chloride 98 - 111 mmol/L 105   100   99    CO2 22 - 32 mmol/L _0 Calcium 8.9 - 10.3 mg/dL 8.6   8.0   8.3    Total Protein 6.5 - 8.1 g/dL 6.3   6.0   5.9    Total Bilirubin 0.3 - 1.2 mg/dL 1.9   1.6   1.7    Alkaline Phos 38 - 126 U/L 733   690   513    AST 15 - 41 U/L 542   699   796    ALT 0 - 44 U/L 245   263   258      Lab Results  Component Value Date   WBC 7.8 05/10/2022   HGB 8.6 (L) 05/10/2022   HCT 26.1 (L) 05/10/2022   MCV 80.1 05/10/2022   PLT 285 05/10/2022   NEUTROABS 7.8 (H) 05/09/2022    Lab Results  Component Value Date   CEA 1.50 10/30/2021   PSA1 2.7 10/30/2021    CT ABDOMEN PELVIS W CONTRAST  Result Date: 05/01/2022 CLINICAL DATA:  Weakness, elevated LFTs, history of gastric cancer status post gastrectomy EXAM: CT ABDOMEN AND PELVIS WITH CONTRAST TECHNIQUE: Multidetector CT imaging of the abdomen and pelvis was performed using the standard protocol following bolus administration of intravenous contrast. RADIATION DOSE REDUCTION: This exam was performed according to the departmental dose-optimization program which includes automated  exposure control, adjustment of the mA and/or kV according to patient size and/or use of iterative reconstruction technique. CONTRAST:  19m OMNIPAQUE IOHEXOL 300 MG/ML  SOLN COMPARISON:  PET-CT dated 01/18/2022 FINDINGS: Lower chest: Trace bilateral pleural effusions, right greater than left. Associated right lower lobe atelectasis. Hepatobiliary: Innumerable hepatic metastases in both lobes, new. Dominant aggregate lesion measures 8.8 cm in the lateral segment left hepatic lobe (series 2/image 25). Index lesion in the inferior right hepatic lobe measures 4.8 cm (series 2/image 50). Gallbladder is notable for layering sludge (series 2/image 52), without associated inflammatory changes. No intrahepatic or extrahepatic duct dilatation. Pancreas: Within normal limits. Spleen: Within normal limits. Adrenals/Urinary Tract: Adrenal glands are within normal limits. Kidneys are within normal limits.  No hydronephrosis. Bladder is mildly thick-walled although underdistended. Stomach/Bowel: Status post gastrectomy. Percutaneous jejunostomy.  No evidence of bowel obstruction. Appendix is not discretely visualized, reportedly surgically absent. No colonic wall thickening or mass is evident on CT. Vascular/Lymphatic: No evidence of abdominal aortic  aneurysm. 14 mm short axis node in the portacaval region (series 2/image 33). Reproductive: Prostate is unremarkable. Other: Moderate abdominopelvic ascites, minimally complex. Musculoskeletal: Mild degenerative changes of the lumbar spine. IMPRESSION: Status post gastrectomy with percutaneous jejunostomy. Innumerable hepatic metastases, new, with index lesions as above. 14 mm short axis portacaval node, suspicious for nodal metastasis. Moderate abdominopelvic ascites. Trace bilateral pleural effusions. Electronically Signed   By: Julian Hy M.D.   On: 05/08/2022 19:28   [place] EKG SITE RITE  Result Date: 05/06/2022 If Site Rite image not attached, placement could not be  confirmed due to current cardiac rhythm.    No future appointments.     LOS: 4 days

## 2022-05-10 NOTE — Progress Notes (Signed)
Physical Therapy Treatment Patient Details Name: Zachary Bowers MRN: [id number] DOB: [birth year] Today's Date: 05/10/2022   History of Present Illness 66 y.o. male with medical history significant of CAD s/p stent, HTN, HLD, DM2 appendectomy. Pt with recent history of stomach cancer s/p gastric resection and feeding tube.  On chemo therapy due to positive nodes.  Presented with 1 month history of nausea and vomiting with abdominal pain.  Progressed to intractable nausea and vomiting.    CT scan that confirmed significant metastatic disease almost essentially replacing his liver.Patient admitted for starting salvage chemotherapy.    PT Comments    Pt is somewhat lethargic, minimal response to questions, wife stated he's very tired. Pt agreed to getting up out of bed. +2 mod assist for supine to sit and to transfer bed to recliner. Pt fatigues quickly with minimal activity. HR 119 with stand pivot transfer. PT now recommending ST-SNF as he continues to require significant assistance for mobility, pt's wife agrees with this recommendation.     Recommendations for follow up therapy are one component of a multi-disciplinary discharge planning process, led by the attending physician.  Recommendations may be updated based on patient status, additional functional criteria and insurance authorization.  Follow Up Recommendations  Skilled nursing-short term rehab (<3 hours/day)     Assistance Recommended at Discharge Frequent or constant Supervision/Assistance  Patient can return home with the following A lot of help with walking and/or transfers;Help with stairs or ramp for entrance;Assist for transportation;Assistance with cooking/housework;A lot of help with bathing/dressing/bathroom   Equipment Recommendations  None recommended by PT    Recommendations for Other Services       Precautions / Restrictions Precautions Precautions: Fall Precaution Comments: N/V, chemo  precautions Restrictions Weight Bearing Restrictions: No     Mobility  Bed Mobility Overal bed mobility: Needs Assistance Bed Mobility: Supine to Sit     Supine to sit: Mod assist     General bed mobility comments: assist with BUEs to pull up to sit, assist to pivot hips to EOB    Transfers Overall transfer level: Needs assistance Equipment used: Rolling walker (2 wheels) Transfers: Sit to/from Stand, Bed to chair/wheelchair/BSC Sit to Stand: Mod assist, +2 safety/equipment   Step pivot transfers: Mod assist, +2 safety/equipment       General transfer comment: patient  not witnin the RW, forward flexed, moved quickly to recliner, taking shuffle steps, cues to reach back to sit down. Pt fatigued quickly with standing tolerance.    Ambulation/Gait                   Stairs             Wheelchair Mobility    Modified Rankin (Stroke Patients Only)       Balance Overall balance assessment: Needs assistance Sitting-balance support: No upper extremity supported, Feet supported Sitting balance-Leahy Scale: Fair     Standing balance support: Bilateral upper extremity supported, During functional activity, Reliant on assistive device for balance Standing balance-Leahy Scale: Poor                              Cognition Arousal/Alertness: Awake/alert Behavior During Therapy: WFL for tasks assessed/performed, Flat affect Overall Cognitive Status: Within Functional Limits for tasks assessed  General Comments: patient made miminal conversation with noted deferring to wife at times during session        Exercises      General Comments        Pertinent Vitals/Pain Pain Assessment Pain Score: 4  Pain Location: "gut" Pain Descriptors / Indicators: Discomfort Pain Intervention(s): Limited activity within patient's tolerance, Monitored during session, Premedicated before session    Home  Living                          Prior Function            PT Goals (current goals can now be found in the care plan section) Acute Rehab PT Goals Patient Stated Goal: agreed to trying OOB, wife requesting ST-SNF PT Goal Formulation: With family Time For Goal Achievement: 2022/06/21 Potential to Achieve Goals: Fair Progress towards PT goals: Progressing toward goals    Frequency    Min 2X/week      PT Plan Discharge plan needs to be updated    Co-evaluation PT/OT/SLP Co-Evaluation/Treatment: Yes            AM-PAC PT "6 Clicks" Mobility   Outcome Measure  Help needed turning from your back to your side while in a flat bed without using bedrails?: A Lot Help needed moving from lying on your back to sitting on the side of a flat bed without using bedrails?: A Lot Help needed moving to and from a bed to a chair (including a wheelchair)?: A Lot Help needed standing up from a chair using your arms (e.g., wheelchair or bedside chair)?: A Lot Help needed to walk in hospital room?: Total Help needed climbing 3-5 steps with a railing? : Total 6 Click Score: 10    End of Session Equipment Utilized During Treatment: Gait belt Activity Tolerance: Patient limited by fatigue Patient left: in chair;with call bell/phone within reach;with family/visitor present Nurse Communication: Mobility status PT Visit Diagnosis: Unsteadiness on feet (R26.81);Muscle weakness (generalized) (M62.81);Difficulty in walking, not elsewhere classified (R26.2);Pain     Time: 3086-5784 PT Time Calculation (min) (ACUTE ONLY): 15 min  Charges:  $Therapeutic Activity: 8-22 mins                     Blondell Reveal Kistler PT 05/10/2022  Acute Rehabilitation Services Pager [phone] Office [phone]

## 2022-05-10 NOTE — Progress Notes (Signed)
[facility] PROGRESS NOTE   Teoman Giraud NUU:[id number] DOB: 1956/08/30 DOA: 04/18/2022  PCP: Shirline Frees, MD  Brief History/Interval Summary:  66 y.o. male with medical history significant of CAD s/p stent, HTN, HLD, DM2. Pt with recent history of stomach cancer s/p gastric resection and feeding tube.  On chemo therapy due to positive nodes.  Presented with 1 month history of nausea and vomiting with abdominal pain.  Progressed to intractable nausea and vomiting.  Was hospitalized for further management.  CT scan raised concern for hepatic metastases.  Consultants: Medical oncology.  Interventional radiology  Procedures: None yet    Subjective/Interval History: Patient continues to feel very fatigued.  He did have a bowel movement this morning.  Tolerating his tube feedings on and off.  Nausea is better.  Pain persists and is currently 5 out of 10 in intensity.     Assessment/Plan:  Intractable nausea vomiting in the setting of gastric cancer CT scan did not show any bowel obstruction.  He has jejunostomy tube which appears to be in place as per CT scan.  His nausea and vomiting could be from his new metastatic process noted on the CT imaging study. Some of his discomfort could also be from constipation.  He was given laxatives.  Patient was subsequently started on metoclopramide due to persistent nausea.   Nausea appears to have improved.  He did have a bowel movement today which hopefully will also improve his symptoms.  Continue metoclopramide and laxatives. Patient was also started on Zosyn at the time of admission due to concern for intra-abdominal infection.  WBCs noted to be normal.  Procalcitonin noted to be elevated.  Patient noted to be afebrile.  LFTs are abnormal but stable.  No ductal dilatation noted on CT scan.  Blood cultures negative so far.  There are likely other reasons for why his procalcitonin is elevated.  No obvious infectious source has been found.   Zosyn was discontinued on 5/27.  He remains afebrile.  His WBC remains normal.  Continue to monitor off of antibiotics.    Gastric cancer with concern for hepatic metastases The lesions in the liver are new compared to previous imaging study.  Patient followed by Dr. Benay Spice with medical oncology.   Patient has received multiple cycles of Taxol/carboplatin previously.  At last oncology visit consideration was being given to adjuvant immunotherapy though he was thought not to be a good candidate for same due to his physical deconditioning and weight loss. Patient seen by medical oncology.  Patient started on chemotherapy with FOLFOX/Herceptin on 5/26.   Continue to mobilize.  He has been seen by PT and OT.  Has not ambulated yet.  Acute kidney injury Elevated BUN and creatinine noted on 5/28.  He was given IV fluid bolus and was continued on IV fluids.  Renal function has improved some.  Since he is not at goal with the tube feedings we will continue with IV fluids for now. We are holding his Avapro and other nephrotoxic agents.   Elevated BUN could be from tube feedings.  Status post jejunostomy No obstruction noted on imaging studies.  Continue tube feedings as tolerated.  Nutritionist is following.  Trickle feed.   Continue laxatives and metoclopramide.    Transaminitis Likely secondary to metastases in the liver noted on imaging studies.  Bilirubin is mildly elevated.  No biliary ductal dilatation noted on CT scan.  Images were reviewed by gastroenterology.  No role for biliary stenting at this time.  LFTs remain persistently elevated.  Continue to monitor.  Diabetes mellitus type 2 HbA1c was 6.6 in March.  Elevated CBGs likely due to tube feedings.  Continue glargine and SSI.  CBGs are better this morning.  Normocytic anemia Likely due to malignancy.  Drop in hemoglobin is likely dilutional.  No overt bleeding.  Anemia panel reviewed.  No clear-cut deficiencies identified.   Hemoglobin  stable for the most part.  Essential hypertension/hyponatremia Blood pressure reasonably well controlled.  Continue amlodipine.  Avapro on hold.  Hypothyroidism Continue levothyroxine.  Hyperlipidemia Continue statin.  Physical deconditioning Patient appears to be quite fatigued.  Likely multifactorial but mainly secondary to his metastatic cancer.  Now he is getting chemotherapy.  Seen by physical and occupational therapy.  Home health is recommended.  Has not ambulated yet.     DVT Prophylaxis: Lovenox Code Status: Full code Family Communication: Discussed with patient. Disposition Plan: Hopefully return home when he is tolerating his tube feedings better and is able to ambulate.  PT and OT is following.  Status is: Inpatient Remains inpatient appropriate because: Need for chemotherapy     Medications: Scheduled:  amLODipine  10 mg Per Tube Daily   aspirin  81 mg Per Tube Daily   Chlorhexidine Gluconate Cloth  6 each Topical Daily   enoxaparin (LOVENOX) injection  40 mg Subcutaneous Q24H   feeding supplement (PROSource TF)  90 mL Per Tube TID   feeding supplement (VITAL 1.5 CAL)  1,000 mL Per Tube Q24H   insulin aspart  0-15 Units Subcutaneous Q4H   insulin glargine-yfgn  5 Units Subcutaneous QHS   levothyroxine  125 mcg Per Tube QAC breakfast   metoCLOPramide (REGLAN) injection  5 mg Intravenous Q8H   sennosides  10 mL Per Tube BID   sodium chloride flush  10-40 mL Intracatheter Q12H   Continuous:  sodium chloride     famotidine (PEPCID) IV (ONCOLOGY)     OBS:JGGEZM chloride, albuterol, alteplase, diphenhydrAMINE, EPINEPHrine, famotidine (PEPCID) IV (ONCOLOGY), heparin lock flush, heparin lock flush, HYDROmorphone, methylPREDNISolone sodium succinate, morphine injection, ondansetron **OR** ondansetron (ZOFRAN) IV, sodium chloride flush, sodium chloride flush, sodium chloride flush  Antibiotics: Anti-infectives (From admission, onward)    Start     Dose/Rate Route  Frequency Ordered Stop   05/07/22 1430  piperacillin-tazobactam (ZOSYN) IVPB 3.375 g        3.375 g 100 mL/hr over 30 Minutes Intravenous  Once 05/07/22 1420 05/07/22 1512   04/26/2022 2015  piperacillin-tazobactam (ZOSYN) IVPB 3.375 g  Status:  Discontinued        3.375 g 12.5 mL/hr over 240 Minutes Intravenous Every 8 hours 05/01/2022 2013 05/08/22 1137       Objective:  Vital Signs  Vitals:   05/09/22 2005 05/09/22 2154 05/10/22 0023 05/10/22 0408  BP: 122/78 131/82 129/82 124/83  Pulse: (!) 117 (!) 116 (!) 119 (!) 117  Resp: '18 18 18 18  '$ Temp: 98.8 F (37.1 C) 98 F (36.7 C) 98.3 F (36.8 C) 97.9 F (36.6 C)  TempSrc: Oral Oral Oral Oral  SpO2: 92% 95% 95% 94%  Weight:      Height:        Intake/Output Summary (Last 24 hours) at 05/10/2022 0940 Last data filed at 05/10/2022 0814 Gross per 24 hour  Intake 2961.42 ml  Output 600 ml  Net 2361.42 ml    Filed Weights   04/25/2022 1618 05/08/22 0407  Weight: 77.1 kg 84.4 kg    General appearance: Awake alert.  In  no distress.  Fatigued Resp: Clear to auscultation bilaterally.  Normal effort Cardio: S1-S2 is normal regular.  No S3-S4.  No rubs murmurs or bruit GI: Abdomen is soft.  Tender in the upper abdomen.  Liver edge is palpable.  PEG tube is noted. Extremities: No edema.  Full range of motion of lower extremities. Neurologic: Alert and oriented x3.  No focal neurological deficits.     Lab Results:  Data Reviewed: I have personally reviewed following labs and reports of the imaging studies  CBC: Recent Labs  Lab 04/27/2022 1434 04/30/2022 1434 04/24/2022 1838 05/06/22 0542 05/07/22 0425 05/08/22 0544 05/09/22 0443 05/10/22 0436  WBC 7.2   < > 7.9 7.8 7.7 8.9 8.5 7.8  NEUTROABS 5.4  --  6.7  --   --   --  7.8*  --   HGB 11.8*   < > 11.5* 10.7* 9.6* 8.2* 8.5* 8.6*  HCT 38.0*  --  36.6* 33.2* 29.6* 26.2* 26.5* 26.1*  MCV 79.8*  --  80.1 80.6 81.3 81.4 80.8 80.1  PLT 488*   < > 459* 326 368 300 317 285   < >  = values in this interval not displayed.     Basic Metabolic Panel: Recent Labs  Lab 05/06/22 0636 05/06/22 1743 05/07/22 0425 05/07/22 1700 05/08/22 0544 05/09/22 0443 05/10/22 0436  NA 138  --  136  --  132* 132* 138  K 4.2  --  4.1  --  3.9 3.6 3.8  CL 100  --  101  --  99 100 105  CO2 27  --  25  --  '24 22 24  '$ GLUCOSE 90  --  106*  --  154* 209* 169*  BUN 23  --  24*  --  34* 55* 60*  CREATININE 1.07  --  0.99  --  1.10 1.27* 1.23  CALCIUM 9.4  --  8.8*  --  8.3* 8.0* 8.6*  MG 2.4 2.2 2.3 1.8  --   --   --   PHOS 3.8 3.4 3.4 2.9  --   --   --      GFR: Estimated Creatinine Clearance: 64.8 mL/min (by C-G formula based on SCr of 1.23 mg/dL).  Liver Function Tests: Recent Labs  Lab 05/06/22 0636 05/07/22 0425 05/08/22 0544 05/09/22 0443 05/10/22 0436  AST 520* 610* 796* 699* 542*  ALT 156* 185* 258* 263* 245*  ALKPHOS 659* 597* 513* 690* 733*  BILITOT 2.1* 2.5* 1.7* 1.6* 1.9*  PROT 7.3 6.5 5.9* 6.0* 6.3*  ALBUMIN 3.0* 2.7* 2.2* 2.2* 2.2*     Recent Labs  Lab 04/25/2022 1838  LIPASE 57*    Recent Labs  Lab 04/18/2022 2213  AMMONIA 25     Coagulation Profile: Recent Labs  Lab 04/16/2022 1838  INR 1.0     CBG: Recent Labs  Lab 05/09/22 1637 05/09/22 1959 05/10/22 0026 05/10/22 0410 05/10/22 0724  GLUCAP 172* 164* 161* 160* 170*      Radiology Studies: No results found.     LOS: 4 days   Jerrik Housholder [employer] on [email or web address]  05/10/2022, 9:40 AM

## 2022-05-11 DIAGNOSIS — N179 Acute kidney failure, unspecified: Secondary | ICD-10-CM | POA: Diagnosis not present

## 2022-05-11 DIAGNOSIS — R7401 Elevation of levels of liver transaminase levels: Secondary | ICD-10-CM | POA: Diagnosis not present

## 2022-05-11 DIAGNOSIS — C160 Malignant neoplasm of cardia: Secondary | ICD-10-CM | POA: Diagnosis not present

## 2022-05-11 LAB — GLUCOSE, CAPILLARY
Glucose-Capillary: 142 mg/dL — ABNORMAL HIGH (ref 70–99)
Glucose-Capillary: 107 mg/dL — ABNORMAL HIGH (ref 70–99)
Glucose-Capillary: 166 mg/dL — ABNORMAL HIGH (ref 70–99)
Glucose-Capillary: 158 mg/dL — ABNORMAL HIGH (ref 70–99)
Glucose-Capillary: 159 mg/dL — ABNORMAL HIGH (ref 70–99)

## 2022-05-11 LAB — COMPREHENSIVE METABOLIC PANEL
ALT: 238 U/L — ABNORMAL HIGH (ref 0–44)
AST: 407 U/L — ABNORMAL HIGH (ref 15–41)
Albumin: 2.2 g/dL — ABNORMAL LOW (ref 3.5–5.0)
Alkaline Phosphatase: 664 U/L — ABNORMAL HIGH (ref 38–126)
Anion gap: 8 (ref 5–15)
BUN: 56 mg/dL — ABNORMAL HIGH (ref 8–23)
CO2: 26 mmol/L (ref 22–32)
Calcium: 8.7 mg/dL — ABNORMAL LOW (ref 8.9–10.3)
Chloride: 106 mmol/L (ref 98–111)
Creatinine, Ser: 0.96 mg/dL (ref 0.61–1.24)
GFR, Estimated: >60 mL/min (ref >60)
Glucose, Bld: 122 mg/dL — ABNORMAL HIGH (ref 70–99)
Potassium: 3.6 mmol/L (ref 3.5–5.1)
Sodium: 140 mmol/L (ref 135–145)
Total Bilirubin: 2.4 mg/dL — ABNORMAL HIGH (ref 0.3–1.2)
Total Protein: 6.1 g/dL — ABNORMAL LOW (ref 6.5–8.1)

## 2022-05-11 LAB — CBC
HCT: 25.2 % — ABNORMAL LOW (ref 39.0–52.0)
Hemoglobin: 8 g/dL — ABNORMAL LOW (ref 13.0–17.0)
MCH: 25.6 pg — ABNORMAL LOW (ref 26.0–34.0)
MCHC: 31.7 g/dL (ref 30.0–36.0)
MCV: 80.5 fL (ref 80.0–100.0)
Platelets: 258 10*3/uL (ref 150–400)
RBC: 3.13 MIL/uL — ABNORMAL LOW (ref 4.22–5.81)
RDW: 17.1 % — ABNORMAL HIGH (ref 11.5–15.5)
WBC: 8.3 10*3/uL (ref 4.0–10.5)
nRBC: 0 % (ref 0.0–0.2)

## 2022-05-11 MED ORDER — OXYCODONE HCL 5 MG PO TABS
5.0000 mg | ORAL_TABLET | ORAL | Status: DC | PRN
  Administered 2022-05-12 – 2022-05-20 (×8): 5 mg via ORAL
  Filled 2022-05-11 (×8): qty 1

## 2022-05-11 MED ORDER — LACTATED RINGERS IV SOLN
INTRAVENOUS | Status: AC
Start: 2022-05-11 — End: 2022-05-12

## 2022-05-11 NOTE — Progress Notes (Signed)
OT Cancellation Note  Patient Details Name: Tyion Boylen MRN: [id number] DOB: August 29, 1956   Cancelled Treatment:    Reason Eval/Treat Not Completed: Other (comment): Pt tried at [phone] and refused to eat breakfast. Tried at 1058 and pt refused as he had recently returned to bed.  Asked OT to return later.  Attempted to 1349 and pt sleeping. Awoke to soft call of name but declined OT due to feeling "worn out".  Will continue efforts tomorrow if able.  Julien Girt 05/11/2022, 1:54 PM

## 2022-05-11 NOTE — Progress Notes (Signed)
[facility] PROGRESS NOTE   Gurdeep Keesey [id number]:[id number] DOB: January 03, 1956 DOA: 05/10/2022  PCP: Shirline Frees, MD  Brief History/Interval Summary:  66 y.o. male with medical history significant of CAD s/p stent, HTN, HLD, DM2. Pt with recent history of stomach cancer s/p gastric resection and feeding tube.  On chemo therapy due to positive nodes.  Presented with 1 month history of nausea and vomiting with abdominal pain.  Progressed to intractable nausea and vomiting.  Was hospitalized for further management.  CT scan raised concern for hepatic metastases.  Consultants: Medical oncology.  Interventional radiology  Procedures: PICC line placement for chemotherapy    Subjective/Interval History: Patient mentioned that he is feeling slightly better today.  Has had less nausea and less pain in the last 24 hours.  He does not appear to be tolerating his tube feedings well.  He said that he will try again today.     Assessment/Plan:  Intractable nausea vomiting in the setting of gastric cancer CT scan did not show any bowel obstruction.  He has jejunostomy tube which appears to be in place as per CT scan.  His nausea and vomiting could be from his new metastatic process noted on the CT imaging study. Some of his discomfort could also be from constipation.  He was given laxatives.  Patient was subsequently started on metoclopramide due to persistent nausea.   Patient has had bowel movements.  Nausea appears to have improved.  Continue metoclopramide laxatives for now.  Gastric cancer with concern for hepatic metastases The lesions in the liver are new compared to previous imaging study.  Patient followed by Dr. Benay Spice with medical oncology.   Patient has received multiple cycles of Taxol/carboplatin previously.  At last oncology visit consideration was being given to adjuvant immunotherapy though he was thought not to be a good candidate for same due to his physical deconditioning  and weight loss. Patient seen by medical oncology.  Patient started on chemotherapy with FOLFOX/Herceptin on 5/26.  PICC line was placed. PT and OT is following.  SNF was recommended but it looks like patient and family preferred to go home with home health.  Acute kidney injury Elevated BUN and creatinine noted on 5/28.  He was given IV fluid bolus and was continued on IV fluids.   Renal function has improved.  BUN is slightly better.   Since he is not at goal with the tube feedings we will continue with IV fluids for now. We are holding his Avapro and other nephrotoxic agents.   Elevated BUN could also be from tube feedings.  Status post jejunostomy No obstruction noted on imaging studies.  Continue tube feedings as tolerated.  Nutritionist is following.  Patient will having a lot of difficulty tolerating his tube feedings.  Hopeful that the feeding rate can be increased today since his symptoms appear to be better after he was started on metoclopramide.  He has had bowel movements. Continue laxatives and metoclopramide.    Transaminitis Likely secondary to metastases in the liver noted on imaging studies.  Bilirubin noted to be elevated.  No biliary ductal dilatation noted on CT scan.  Images were reviewed by gastroenterology.  No role for biliary stenting at this time. LFTs remain persistently elevated.  Continue to monitor.  Concern for infection Patient was also started on Zosyn at the time of admission due to concern for intra-abdominal infection.  WBCs noted to be normal.  Procalcitonin noted to be elevated.  Patient noted to be afebrile.  LFTs are abnormal but stable.  No ductal dilatation noted on CT scan.  Blood cultures negative so far.  There are likely other reasons for why his procalcitonin is elevated.  No obvious infectious source has been found.  Zosyn was discontinued on 5/27.  He remains afebrile.  His WBC remains normal.  Continue to monitor off of antibiotics.    Diabetes  mellitus type 2 HbA1c was 6.6 in March.  Elevated CBGs likely due to tube feedings.   Started on glargine.  SSI to continue.  CBGs are better.    Normocytic anemia Likely due to malignancy.  Drop in hemoglobin is likely dilutional.  No overt bleeding.   Anemia panel reviewed.  No clear-cut deficiencies identified.   Continue to monitor hemoglobin.  Transfuse if it drops below 7 or if he has symptoms.  Essential hypertension/hyponatremia Blood pressure reasonably well controlled.  Continue amlodipine.  Avapro on hold.  Hypothyroidism Continue levothyroxine.  Hyperlipidemia Continue statin.  Physical deconditioning Patient appears to be quite fatigued.  Likely multifactorial but mainly secondary to his metastatic cancer.   PT and OT is following.  SNF is recommended but looks like patient and family want to go home instead.   DVT Prophylaxis: Lovenox Code Status: Full code Family Communication: Discussed with patient. Disposition Plan: Hopefully return home when he is tolerating his tube feedings better and is able to ambulate.  PT and OT is following.  Status is: Inpatient Remains inpatient appropriate because: Need for chemotherapy     Medications: Scheduled:  amLODipine  10 mg Per Tube Daily   aspirin  81 mg Per Tube Daily   Chlorhexidine Gluconate Cloth  6 each Topical Daily   enoxaparin (LOVENOX) injection  40 mg Subcutaneous Q24H   feeding supplement (PROSource TF)  90 mL Per Tube TID   feeding supplement (VITAL 1.5 CAL)  1,000 mL Per Tube Q24H   insulin aspart  0-15 Units Subcutaneous Q4H   insulin glargine-yfgn  5 Units Subcutaneous QHS   levothyroxine  125 mcg Per Tube QAC breakfast   metoCLOPramide (REGLAN) injection  5 mg Intravenous Q8H   sennosides  10 mL Per Tube BID   sodium chloride flush  10-40 mL Intracatheter Q12H   Continuous:  sodium chloride     famotidine (PEPCID) IV (ONCOLOGY)     lactated ringers 100 mL/hr at 05/11/22 0300   HMC:NOBSJG  chloride, albuterol, alteplase, diphenhydrAMINE, EPINEPHrine, famotidine (PEPCID) IV (ONCOLOGY), heparin lock flush, heparin lock flush, methylPREDNISolone sodium succinate, morphine injection, ondansetron **OR** ondansetron (ZOFRAN) IV, oxyCODONE, sodium chloride flush, sodium chloride flush, sodium chloride flush  Antibiotics: Anti-infectives (From admission, onward)    Start     Dose/Rate Route Frequency Ordered Stop   05/07/22 1430  piperacillin-tazobactam (ZOSYN) IVPB 3.375 g        3.375 g 100 mL/hr over 30 Minutes Intravenous  Once 05/07/22 1420 05/07/22 1512   04/18/2022 2015  piperacillin-tazobactam (ZOSYN) IVPB 3.375 g  Status:  Discontinued        3.375 g 12.5 mL/hr over 240 Minutes Intravenous Every 8 hours 05/01/2022 2013 05/08/22 1137       Objective:  Vital Signs  Vitals:   05/10/22 2257 05/11/22 0123 05/11/22 0328 05/11/22 0618  BP:  120/85 123/86 125/84  Pulse: (!) 117 (!) 115 (!) 110 (!) 106  Resp:  16 18   Temp:  98 F (36.7 C) 97.8 F (36.6 C) (!) 97.3 F (36.3 C)  TempSrc:  Oral Oral Oral  SpO2: 94%  98% 95% 94%  Weight:      Height:        Intake/Output Summary (Last 24 hours) at 05/11/2022 0855 Last data filed at 05/11/2022 0848 Gross per 24 hour  Intake 1961.44 ml  Output 750 ml  Net 1211.44 ml    Filed Weights   04/24/2022 1618 05/08/22 0407  Weight: 77.1 kg 84.4 kg    General appearance: Awake alert.  In no distress.  Fatigued Resp: Clear to auscultation bilaterally.  Normal effort Cardio: S1-S2 is normal regular.  No S3-S4.  No rubs murmurs or bruit GI: Abdomen is soft.  Tender in the epigastric and right upper quadrant.  Liver edge is palpable.  Jejunostomy tube is noted. Extremities: No edema.  Full range of motion of lower extremities. Neurologic: Alert and oriented x3.  No focal neurological deficits.     Lab Results:  Data Reviewed: I have personally reviewed following labs and reports of the imaging studies  CBC: Recent Labs  Lab  04/24/2022 1434 04/26/2022 1838 05/06/22 0542 05/07/22 0425 05/08/22 0544 05/09/22 0443 05/10/22 0436 05/11/22 0616  WBC 7.2 7.9   < > 7.7 8.9 8.5 7.8 8.3  NEUTROABS 5.4 6.7  --   --   --  7.8*  --   --   HGB 11.8* 11.5*   < > 9.6* 8.2* 8.5* 8.6* 8.0*  HCT 38.0* 36.6*   < > 29.6* 26.2* 26.5* 26.1* 25.2*  MCV 79.8* 80.1   < > 81.3 81.4 80.8 80.1 80.5  PLT 488* 459*   < > 368 300 317 285 258   < > = values in this interval not displayed.     Basic Metabolic Panel: Recent Labs  Lab 05/06/22 0636 05/06/22 1743 05/07/22 0425 05/07/22 1700 05/08/22 0544 05/09/22 0443 05/10/22 0436 05/11/22 0616  NA 138  --  136  --  132* 132* 138 140  K 4.2  --  4.1  --  3.9 3.6 3.8 3.6  CL 100  --  101  --  99 100 105 106  CO2 27  --  25  --  '24 22 24 26  '$ GLUCOSE 90  --  106*  --  154* 209* 169* 122*  BUN 23  --  24*  --  34* 55* 60* 56*  CREATININE 1.07  --  0.99  --  1.10 1.27* 1.23 0.96  CALCIUM 9.4  --  8.8*  --  8.3* 8.0* 8.6* 8.7*  MG 2.4 2.2 2.3 1.8  --   --   --   --   PHOS 3.8 3.4 3.4 2.9  --   --   --   --      GFR: Estimated Creatinine Clearance: 83.1 mL/min (by C-G formula based on SCr of 0.96 mg/dL).  Liver Function Tests: Recent Labs  Lab 05/07/22 0425 05/08/22 0544 05/09/22 0443 05/10/22 0436 05/11/22 0616  AST 610* 796* 699* 542* 407*  ALT 185* 258* 263* 245* 238*  ALKPHOS 597* 513* 690* 733* 664*  BILITOT 2.5* 1.7* 1.6* 1.9* 2.4*  PROT 6.5 5.9* 6.0* 6.3* 6.1*  ALBUMIN 2.7* 2.2* 2.2* 2.2* 2.2*     Recent Labs  Lab 05/02/2022 1838  LIPASE 57*    Recent Labs  Lab 04/12/2022 2213  AMMONIA 25     Coagulation Profile: Recent Labs  Lab 05/01/2022 1838  INR 1.0     CBG: Recent Labs  Lab 05/10/22 1655 05/10/22 2006 05/10/22 2342 05/11/22 0335 05/11/22 0731  GLUCAP 174*  143* 127* 142* 107*      Radiology Studies: No results found.     LOS: 5 days   Branson Kranz [employer] on [email or web address]  05/11/2022, 8:55 AM

## 2022-05-11 NOTE — Progress Notes (Addendum)
HEMATOLOGY-ONCOLOGY PROGRESS NOTE  ASSESSMENT AND PLAN: Gastric cancer CT abdomen/pelvis 10/08/2021-probable mass in the proximal stomach, enlarged gastropathic ligament node Upper endoscopy 10/01/2021-gastric cardia/fundus mass with oozing and stigmata of recent bleeding-biopsy at least intramucosal adenocarcinoma, mismatch repair protein expression intact, PD-L1 combined positive score 7%, HER2 positive PET 10/20/2021-hypermetabolic proximal gastric primary, or hypermetabolic gastropathic ligament node, right paramidline prostatic hypermetabolism, anal hypermetabolism without a CT mass Radiation 11/11/2021-12/22/2021 Cycle 1  Taxol/carboplatin 11/12/2021 Cycle 2 Taxol/carboplatin 11/19/2021 Cycle 3 Taxol/carboplatin 11/26/2021 Cycle 4 Taxol/carboplatin 12/03/2021 Cycle 5 Taxol/carboplatin 12/18/2021 PET 01/19/2022-decreased hypermetabolism at the gastric cardia, decreased size and metabolic activity associated with an enlarged gastrohepatic node, persistent anal hypermetabolism, stable lateral left lower lobe nodule, possible new more medial subpleural left lower lobe nodule-both nodules below PET resolution 02/11/2022-total gastrectomy with Roux-en-Y esophagojejunostomy, placement of jejunal feeding tube,ypT2,ypN1 moderate to poorly differentiated adenocarcinoma at the GE junction measuring 2.5 x 2.3 cm, lymphovascular invasion present, negative resection margins, 2/28 lymph nodes CT abdomen/pelvis with contrast 04/17/2022-innumerable hepatic metastases which are new, portacaval node suspicious for nodal metastasis. Cycle 1 FOLFOX/trastuzumab 05/07/2022   Microcytic anemia secondary to #1 Diabetes Hypertension Coronary artery disease, cardiac catheterization 08/11/2021-placement of proximal and distal right coronary artery stents Hypothyroidism Sleep apnea Anal hypermetabolism on the PET 10/20/2021-no mass identified Hospital admission 04/22/2022-nausea and vomiting, transaminitis.  Mr. Rossa is now  at day 5 following cycle 1 FOLFOX/trastuzumab.  He appears to have tolerated the chemotherapy without significant acute toxicity.  The elevated liver enzymes remain stable.   He is not requiring much in terms of pain medication.  Will DC oral Dilaudid and change to oxycodone 5 mg every 4 hours as needed for pain.  He was again encouraged to get out of bed is much as possible and to increase his oral intake.  He was also encouraged to continue his J-tube feedings as much as possible.  I spoke with the patient's wife and daughter who are at the bedside today.  Discussed discharge planning including home versus SNF.  The patient's wife and the patient were very clear that they would like to go home.  They do request some equipment in the home such as a bedside commode and will asked TOC to follow-up with them.  He is stable for discharge from an oncology standpoint when he is able to ambulate and tolerate feedings.  Recommendations: 1.  DC Dilaudid.  Oxycodone 5 mg p.o. every 4 hours as needed pain. 2.  Advance diet as tolerated.  Continue J-tube feeding. 3.  Increase time out of bed 4.  TOC referral for home equipment. 5.  Outpatient follow-up at the Cancer center for cycle 2 FOLFOX/Herceptin will be scheduled for 05/24/2022  [name], NP   Mr. Hurn was interviewed and examined.  He appears unchanged.  He continues to have a poor performance status.  I encouraged him to increase his oral intake and ambulation.  He would like to return home at discharge.  Outpatient follow-up will be scheduled at the Cancer center.  I was present for greater than 50% today's visit.  I performed medical decision making.  [name], MD   SUBJECTIVE: Mr. Odor reports feeling better this morning.  However, his tube feed was held overnight per his request due to abdominal discomfort/fullness.  Reports hiccups.  Pain controlled.  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[place])    10/28/2021 Cancer Staging   Staging  form: Stomach, AJCC 8th Edition - Clinical: Stage Unknown (cTX, cN1, cM0) - Signed by Ladell Pier, MD on 10/28/2021    11/12/2021 - 12/18/2021 Chemotherapy   Patient is on Treatment Plan : ESOPHAGUS Carboplatin/PACLitaxel weekly x 6 weeks with XRT        05/07/2022 -  Chemotherapy   Patient is on Treatment Plan : GASTROESOPHAGEAL FOLFOX D1,15 + Trastuzumab (6/4) D1,15 + Pembrolizumab (200) every 3 weeks, q28d      Gastric cancer ([place])  02/11/2022 Initial Diagnosis   Gastric cancer ([place])    03/04/2022 Cancer Staging   Staging form: Stomach, AJCC 8th Edition - Pathologic: Stage IIA (pT2, pN1, cM0) - Signed by Ladell Pier, MD on 03/04/2022 Histologic grade (G): G2 Histologic grading system: 3 grade system Residual tumor (R): R0 - None    04/02/2022 -  Chemotherapy   Patient is on Treatment Plan : HEAD/NECK Nivolumab q14d        PHYSICAL EXAMINATION:  Vitals:   05/11/22 0328 05/11/22 0618  BP: 123/86 125/84  Pulse: (!) 110 (!) 106  Resp: 18   Temp: 97.8 F (36.6 C) (!) 97.3 F (36.3 C)  SpO2: 95% 94%   Filed Weights   04/22/2022 1618 05/08/22 0407  Weight: 77.1 kg 84.4 kg    Intake/Output from previous day: 05/29 0701 - 05/30 0700 In: 1951.4 [P.O.:240; I.V.:1631.4; NG/GT:80] Out: 950 [Urine:950]  Physical exam: HEENT-no thrush Lungs-clear anteriorly Cardiac-regular rate and rhythm Abdomen-the liver is palpable in the right upper abdomen, left upper quadrant feeding tube Vascular-no leg edema  LABORATORY DATA:  I have reviewed the data as listed    Latest Ref Rng & Units 05/11/2022    6:16 AM 05/10/2022    4:36 AM 05/09/2022    4:43 AM  CMP  Glucose 70 - 99 mg/dL 122   169   209    BUN 8 - 23 mg/dL 56   60   55    Creatinine 0.61 - 1.24 mg/dL 0.96   1.23   1.27    Sodium 135 - 145 mmol/L 140   138   132    Potassium 3.5 - 5.1 mmol/L  3.6   3.8   3.6    Chloride 98 - 111 mmol/L 106   105   100    CO2 22 - 32 mmol/L _0 Calcium 8.9 - 10.3 mg/dL 8.7   8.6   8.0    Total Protein 6.5 - 8.1 g/dL 6.1   6.3   6.0    Total Bilirubin 0.3 - 1.2 mg/dL 2.4   1.9   1.6    Alkaline Phos 38 - 126 U/L 664   733   690    AST 15 - 41 U/L 407   542   699    ALT 0 - 44 U/L 238   245   263      Lab Results  Component Value Date   WBC 8.3 05/11/2022   HGB 8.0 (L) 05/11/2022   HCT 25.2 (L) 05/11/2022   MCV 80.5 05/11/2022   PLT 258 05/11/2022   NEUTROABS 7.8 (H) 05/09/2022    Lab Results  Component Value Date   CEA 1.50 10/30/2021   PSA1 2.7 10/30/2021    CT ABDOMEN PELVIS W CONTRAST  Result Date: 04/17/2022 CLINICAL DATA:  Weakness, elevated LFTs, history of gastric cancer status post gastrectomy EXAM: CT ABDOMEN AND PELVIS WITH CONTRAST TECHNIQUE:  Multidetector CT imaging of the abdomen and pelvis was performed using the standard protocol following bolus administration of intravenous contrast. RADIATION DOSE REDUCTION: This exam was performed according to the departmental dose-optimization program which includes automated exposure control, adjustment of the mA and/or kV according to patient size and/or use of iterative reconstruction technique. CONTRAST:  8m OMNIPAQUE IOHEXOL 300 MG/ML  SOLN COMPARISON:  PET-CT dated 01/18/2022 FINDINGS: Lower chest: Trace bilateral pleural effusions, right greater than left. Associated right lower lobe atelectasis. Hepatobiliary: Innumerable hepatic metastases in both lobes, new. Dominant aggregate lesion measures 8.8 cm in the lateral segment left hepatic lobe (series 2/image 25). Index lesion in the inferior right hepatic lobe measures 4.8 cm (series 2/image 50). Gallbladder is notable for layering sludge (series 2/image 52), without associated inflammatory changes. No intrahepatic or extrahepatic duct dilatation. Pancreas: Within normal limits. Spleen: Within normal limits.  Adrenals/Urinary Tract: Adrenal glands are within normal limits. Kidneys are within normal limits.  No hydronephrosis. Bladder is mildly thick-walled although underdistended. Stomach/Bowel: Status post gastrectomy. Percutaneous jejunostomy.  No evidence of bowel obstruction. Appendix is not discretely visualized, reportedly surgically absent. No colonic wall thickening or mass is evident on CT. Vascular/Lymphatic: No evidence of abdominal aortic aneurysm. 14 mm short axis node in the portacaval region (series 2/image 33). Reproductive: Prostate is unremarkable. Other: Moderate abdominopelvic ascites, minimally complex. Musculoskeletal: Mild degenerative changes of the lumbar spine. IMPRESSION: Status post gastrectomy with percutaneous jejunostomy. Innumerable hepatic metastases, new, with index lesions as above. 14 mm short axis portacaval node, suspicious for nodal metastasis. Moderate abdominopelvic ascites. Trace bilateral pleural effusions. Electronically Signed   By: SJulian HyM.D.   On: 04/24/2022 19:28   [place] SITE RITE  Result Date: 05/06/2022 If Site Rite image not attached, placement could not be confirmed due to current cardiac rhythm.    No future appointments.     LOS: 5 days

## 2022-05-11 NOTE — Progress Notes (Signed)
   05/10/22 2257  Assess: MEWS Score  Pulse Rate (!) 117  SpO2 94 %  Assess: MEWS Score  MEWS Temp 0  MEWS Systolic 0  MEWS Pulse 2  MEWS RR 0  MEWS LOC 0  MEWS Score 2  MEWS Score Color Yellow  Assess: if the MEWS score is Yellow or Red  Were vital signs taken at a resting state? Yes  Focused Assessment No change from prior assessment  Does the patient meet 2 or more of the SIRS criteria? No  MEWS guidelines implemented *See Row Information* Yes  Treat  MEWS Interventions Escalated (See documentation below);Administered prn meds/treatments  Pain Scale 0-10  Pain Score 6  Take Vital Signs  Increase Vital Sign Frequency  Yellow: Q 2hr X 2 then Q 4hr X 2, if remains yellow, continue Q 4hrs  Escalate  MEWS: Escalate Yellow: discuss with charge nurse/RN and consider discussing with provider and RRT  Notify: Charge Nurse/RN  Name of Charge Nurse/RN Notified Carmin Richmond, RN  Date Charge Nurse/RN Notified 05/10/22  Time Charge Nurse/RN Notified 2257  Document  Patient Outcome Other (Comment) (Stable on unit)  Assess: SIRS CRITERIA  SIRS Temperature  0  SIRS Pulse 1  SIRS Respirations  0  SIRS WBC 0  SIRS Score Sum  1

## 2022-05-11 NOTE — Progress Notes (Signed)
       Subjective: Only tolerating tube feeds intermittently. Having bowel movements. LFTs remain elevated but stable.   Objective: Vital signs in last 24 hours: Temp:  [97.3 F (36.3 C)-99.1 F (37.3 C)] 97.3 F (36.3 C) (05/30 0618) Pulse Rate:  [90-117] 106 (05/30 0618) Resp:  [16-18] 18 (05/30 0328) BP: (120-131)/(60-86) 125/84 (05/30 0618) SpO2:  [92 %-98 %] 94 % (05/30 0618) Last BM Date : 05/10/22  Intake/Output from previous day: 05/29 0701 - 05/30 0700 In: 1951.4 [P.O.:240; I.V.:1631.4; NG/GT:80] Out: 950 [Urine:950] Intake/Output this shift: No intake/output data recorded.  PE: General: resting in bed, appears ill Neuro: alert and oriented, no focal deficits Resp: normal work of breathing on room air Abdomen: soft, nondistended, nontender to palpation. J tube in place. Extremities: warm and well-perfused   Lab Results:  Recent Labs    05/10/22 0436 05/11/22 0616  WBC 7.8 8.3  HGB 8.6* 8.0*  HCT 26.1* 25.2*  PLT 285 258   BMET Recent Labs    05/10/22 0436 05/11/22 0616  NA 138 140  K 3.8 3.6  CL 105 106  CO2 24 26  GLUCOSE 169* 122*  BUN 60* 56*  CREATININE 1.23 0.96  CALCIUM 8.6* 8.7*   PT/INR No results for input(s): LABPROT, INR in the last 72 hours. CMP     Component Value Date/Time   NA 140 05/11/2022 0616   NA 136 08/21/2021 1015   K 3.6 05/11/2022 0616   CL 106 05/11/2022 0616   CO2 26 05/11/2022 0616   GLUCOSE 122 (H) 05/11/2022 0616   BUN 56 (H) 05/11/2022 0616   BUN 23 08/21/2021 1015   CREATININE 0.96 05/11/2022 0616   CREATININE 0.89 05/06/2022 1434   CALCIUM 8.7 (L) 05/11/2022 0616   PROT 6.1 (L) 05/11/2022 0616   ALBUMIN 2.2 (L) 05/11/2022 0616   AST 407 (H) 05/11/2022 0616   AST 491 (HH) 04/23/2022 1434   ALT 238 (H) 05/11/2022 0616   ALT 151 (H) 04/15/2022 1434   ALKPHOS 664 (H) 05/11/2022 0616   BILITOT 2.4 (H) 05/11/2022 0616   BILITOT 2.6 (H) 05/01/2022 1434   GFRNONAA >60 05/11/2022 0616   GFRNONAA >60  04/17/2022 1434   Lipase     Component Value Date/Time   LIPASE 57 (H) 04/20/2022 1838       Studies/Results: No results found.    Assessment/Plan  66 yo male with gastric adenocarcinoma, s/p total gastrectomy with roux-en-Y esophagojejunostomy and feeding J tube on 02/11/22. Now admitted with nausea/vomiting and found to have metastatic disease to the liver. - Continue J tube feeds as tolerated, although patient has not been able to get to goal feeds and has very minimal oral intake. Consider TPN to supplement nutrition. Ok for full liquid diet, advance as tolerated to a dysphagia diet. - CT shows appropriate position of J tube and no signs of bowel obstruction. - Received cycle of FOLFOX/trastuzumab per oncology - Surgery will follow    LOS: 5 days    Michaelle Birks, MD [facility] Surgery General, Hepatobiliary and Pancreatic Surgery 05/11/22 8:00 AM

## 2022-05-11 NOTE — Progress Notes (Signed)
Nutrition Follow-up  DOCUMENTATION CODES:   Severe malnutrition in context of chronic illness  INTERVENTION:   Monitor magnesium, potassium, and phosphorus BID for at least 3 days, MD to replete as needed, as pt is at risk for refeeding syndrome.   -Continue Vital 1.5 @ 20 ml/hr via J-tube  -90 ml Prosource TF TID -Provides 960 kcals, 98g protein and 366 ml H2O   If able to advance, advance by 10 ml every 24 hours to goal below.   Goal rate for TF: -Vital 1.5 @ 55 ml/hr via J-tube x 24 hours -45 ml Prosource TF BID -Provides 2060 kcals, 111g protein and 1008 ml H2O  -Once IVF are d/c, free water recommended of 100 ml every 3 hours (800 ml)   -Consider initiation of TPN if pt unable to tolerate advancement in tube feedings.    NUTRITION DIAGNOSIS:   Severe Malnutrition related to chronic illness, cancer and cancer related treatments as evidenced by energy intake < or equal to 75% for > or equal to 1 month, percent weight loss, moderate fat depletion, mild fat depletion, moderate muscle depletion.  Ongoing.  GOAL:   Patient will meet greater than or equal to 90% of their needs  Not meeting.  MONITOR:   Labs, Weight trends, TF tolerance, I & O's, GOC  ASSESSMENT:   66 y.o. male with medical history significant of CAD s/p stent, HTN, HLD, DM2. Pt with recent history of stomach cancer s/p gastric resection and feeding tube.  On chemo therapy due to positive nodes.  Presented with 1 month history of nausea and vomiting with abdominal pain.  Progressed to intractable nausea and vomiting.  Was hospitalized for further management.  CT scan raised concern for hepatic metastases.  5/25: PICC placed 5/26: started FOLFOX plus trastuzumab  Patient in room, family at bedside. Staff in room, assisting patient get cleaned up from having BM.  Per discussion with RN, pt is not letting tube feeds run continuously, asking staff to turn it off. Family has even been turning it off without  alerting staff. Pt is doing okay with the Prosource TF doses but not accepting all of them. There are no reports of intolerance symptoms, pt just feeling full. Per surgery note, CT showed J-tube in good position.  Has not had tube feeding run beyond 20 ml/hr. Currently, won't let RN turn TF back on.  Have recommended TPN to help pt meet nutritional needs.  Will continue to monitor plan of care.  Is on full liquids but only consuming soups and orange juice. RN reports he refuses Ensure and Boost Breeze.  Admission weight: 170 lbs. Current weight: 186 lbs  I/Os: +6.6L since admit  Medications: IV Reglan, Senokot, Lactated ringers  Labs reviewed:  CBGs: 107-166  Diet Order:   Diet Order             Diet full liquid Room service appropriate? Yes; Fluid consistency: Thin  Diet effective now                   EDUCATION NEEDS:   Education needs have been addressed  Skin:  Skin Assessment: Reviewed RN Assessment  Last BM:  5/30  Height:   Ht Readings from Last 1 Encounters:  05/08/2022 6' (1.829 m)    Weight:   Wt Readings from Last 1 Encounters:  05/08/22 84.4 kg    BMI:  Body mass index is 25.24 kg/m.  Estimated Nutritional Needs:   Kcal:  2200-2400  Protein:  110-120g  Fluid:  2.2L/day  Clayton Bibles, MS, RD, LDN Inpatient Clinical Dietitian Contact information available via Amion

## 2022-05-11 NOTE — Progress Notes (Signed)
Patient asked to be disconnected from tube feedings at 0809. He allowed RN to re-connect feedings at 1430 and asked again for them to be stopped at 1721. RN educated on the importance of proper nutrition and patient states understanding. MD aware as this has been an ongoing issue. Patient was asked during bedside shift report if he could be reconnected to his tube feedings again and patient declined. Will continue to encourage patient to be hooked back up to tube feedings.

## 2022-05-11 NOTE — Progress Notes (Signed)
PT Cancellation Note  Patient Details Name: Zachary Bowers MRN: [id number] DOB: [birth year]   Cancelled Treatment:    Reason Eval/Treat Not Completed: Other (comment) Attempted x 2: patient just in recliner and ready to eat, then back in bed.  [place] Pager [phone] Office [phone]    Claretha Cooper 05/11/2022, 11:53 AM

## 2022-05-11 NOTE — Progress Notes (Signed)
Patient allowed RN to resume Vital 1.5 tube feedings at 1357. RN was called to patient's room at 1714 and patient asked for tube feeds to be turned off as he was feeling bloated, gassy and uncomfortable. MD and nutritionist aware as this has been an ongoing problem for Zachary Bowers. Will continue to monitor and encourage nutrition.

## 2022-05-11 NOTE — TOC Progression Note (Signed)
Transition of Care [facility]) - Progression Note   Patient Details  Name: Zachary Bowers MRN: [id number] Date of Birth: 07-09-1956  Transition of Care [facility]) CM/SW [place], LCSW Phone Number: 05/11/2022, 1:13 PM  Clinical Narrative: CSW spoke with wife and family is agreeable to using [facility] again for [facility] services. CSW discussed a 3N1/BSC with wife and explained that Medicare has not been covering the cost, so private pay options were discussed. Family to private pay for [facility]. CSW made referral for PT/OT to Cindie with [facility], which was accepted.  Expected Discharge Plan: [place] Barriers to Discharge: Continued Medical Work up  Expected Discharge Plan and Services Expected Discharge Plan: [place] In-house Referral: Clinical Social Work Post Acute Care Choice: Durable Medical Equipment, Home Health Living arrangements for the past 2 months: Single Family Home               DME Arranged: Walker rolling DME Agency: [employer] Date DME Agency Contacted: 05/10/22 Time DME Agency Contacted: 1137 Representative spoke with at DME Agency: Andee Poles HH Arranged: PT, OT [place] Agency: [place] Date [place]: 05/11/22 Time [place]: 1249 Representative spoke with at [place]: [place]  Readmission Risk Interventions    05/10/2022   11:43 AM  Readmission Risk Prevention Plan  HRI or [place] Complete  Social Work Consult for [place] Planning/Counseling Complete  Palliative Care Screening Not Applicable

## 2022-05-12 DIAGNOSIS — C160 Malignant neoplasm of cardia: Secondary | ICD-10-CM | POA: Diagnosis not present

## 2022-05-12 DIAGNOSIS — I10 Essential (primary) hypertension: Secondary | ICD-10-CM | POA: Diagnosis not present

## 2022-05-12 DIAGNOSIS — E119 Type 2 diabetes mellitus without complications: Secondary | ICD-10-CM | POA: Diagnosis not present

## 2022-05-12 DIAGNOSIS — R112 Nausea with vomiting, unspecified: Secondary | ICD-10-CM | POA: Diagnosis not present

## 2022-05-12 DIAGNOSIS — R1084 Generalized abdominal pain: Secondary | ICD-10-CM | POA: Diagnosis not present

## 2022-05-12 LAB — COMPREHENSIVE METABOLIC PANEL
ALT: 237 U/L — ABNORMAL HIGH (ref 0–44)
AST: 369 U/L — ABNORMAL HIGH (ref 15–41)
Albumin: 2.2 g/dL — ABNORMAL LOW (ref 3.5–5.0)
Alkaline Phosphatase: 658 U/L — ABNORMAL HIGH (ref 38–126)
Anion gap: 6 (ref 5–15)
BUN: 43 mg/dL — ABNORMAL HIGH (ref 8–23)
CO2: 27 mmol/L (ref 22–32)
Calcium: 8.8 mg/dL — ABNORMAL LOW (ref 8.9–10.3)
Chloride: 108 mmol/L (ref 98–111)
Creatinine, Ser: 0.81 mg/dL (ref 0.61–1.24)
GFR, Estimated: >60 mL/min (ref >60)
Glucose, Bld: 107 mg/dL — ABNORMAL HIGH (ref 70–99)
Potassium: 3.7 mmol/L (ref 3.5–5.1)
Sodium: 141 mmol/L (ref 135–145)
Total Bilirubin: 3.1 mg/dL — ABNORMAL HIGH (ref 0.3–1.2)
Total Protein: 5.9 g/dL — ABNORMAL LOW (ref 6.5–8.1)

## 2022-05-12 LAB — GLUCOSE, CAPILLARY
Glucose-Capillary: 102 mg/dL — ABNORMAL HIGH (ref 70–99)
Glucose-Capillary: 106 mg/dL — ABNORMAL HIGH (ref 70–99)
Glucose-Capillary: 113 mg/dL — ABNORMAL HIGH (ref 70–99)
Glucose-Capillary: 137 mg/dL — ABNORMAL HIGH (ref 70–99)
Glucose-Capillary: 152 mg/dL — ABNORMAL HIGH (ref 70–99)
Glucose-Capillary: 160 mg/dL — ABNORMAL HIGH (ref 70–99)

## 2022-05-12 MED ORDER — FREE WATER
100.0000 mL | Freq: Four times a day (QID) | Status: DC
  Administered 2022-05-12 – 2022-05-18 (×22): 100 mL

## 2022-05-12 MED ORDER — METOCLOPRAMIDE HCL 5 MG/ML IJ SOLN
10.0000 mg | Freq: Three times a day (TID) | INTRAMUSCULAR | Status: DC
  Administered 2022-05-12 – 2022-05-19 (×20): 10 mg via INTRAVENOUS
  Filled 2022-05-12 (×20): qty 2

## 2022-05-12 NOTE — Progress Notes (Signed)
Physical Therapy Treatment Patient Details Name: Barton Want MRN: [id number] DOB: [birth year] Today's Date: 05/12/2022   History of Present Illness Seraj Dunnam is an 66 y.o. male past medical history significant for CAD status post stents, essential hypertension, diabetes mellitus type 2, recently diagnosed with gastric cancer status post gastric resection and feeding tube on chemotherapy due to node positive.  Presents with 1 month history of nausea and vomiting and abdominal pain which is intractable CT raise concern for hepatic metastases.  Oncology and interventional radiology has been consulted.    PT Comments    General Comments: AxO x 3 required MAX encouragement to participate.  Feels like "have NO enegy". Assisted OOB required increased time.  General bed mobility comments: performed a partial "log roll" with elevated HOB and use of rails.  Slow.  Feels "tired". General transfer comment: pt able from elevated bed by pulling himself up using walker and B quads vs push up on bed  General Gait Details: limited distance due to MAX c/o weakness/fatigue and "heavy" legs.  Pt amb 12 feet out of room then turned around to amb another 12 feet back to bed.  MAX c/o "no energy". Required extended rest break after. Assisted back to bed per pt request.  RN reported he was OOB earlier in recliner but lasted no more that 30 min.    Recommendations for follow up therapy are one component of a multi-disciplinary discharge planning process, led by the attending physician.  Recommendations may be updated based on patient status, additional functional criteria and insurance authorization.  Follow Up Recommendations  Home health PT (family declining rec SNF and plan to take pt home)     Assistance Recommended at Discharge Frequent or constant Supervision/Assistance  Patient can return home with the following A lot of help with walking and/or transfers;Help with stairs or ramp for entrance;Assist for  transportation;Assistance with cooking/housework;A lot of help with bathing/dressing/bathroom   Equipment Recommendations  None recommended by PT    Recommendations for Other Services       Precautions / Restrictions Precautions Precautions: Fall Precaution Comments: N/V, chemo precautions Restrictions Weight Bearing Restrictions: No     Mobility  Bed Mobility Overal bed mobility: Needs Assistance Bed Mobility: Rolling, Sidelying to Sit, Sit to Sidelying           General bed mobility comments: performed a partial "log roll" with elevated HOB and use of rails.  Slow.  Feels "tired".    Transfers Overall transfer level: Needs assistance Equipment used: Rolling walker (2 wheels) Transfers: Sit to/from Stand Sit to Stand: Min assist           General transfer comment: pt able from elevated bed by pulling himself up using walker and B quads vs push up on bed    Ambulation/Gait Ambulation/Gait assistance: Supervision, Min guard Gait Distance (Feet): 25 Feet Assistive device: Rolling walker (2 wheels) Gait Pattern/deviations: Step-to pattern, Decreased step length - right, Decreased step length - left, Shuffle Gait velocity: decreased     General Gait Details: limited distance due to MAX c/o weakness/fatigue and "heavy" legs.  Pt amb 12 feet out of room then turned around to amb another 12 feet back to bed.  MAX c/o "no energy". Required extended rest break after.   Stairs             Wheelchair Mobility    Modified Rankin (Stroke Patients Only)       Balance  Cognition Arousal/Alertness: Awake/alert Behavior During Therapy: Flat affect Overall Cognitive Status: Within Functional Limits for tasks assessed                                 General Comments: AxO x 3 required MAX encouragement to participate.  Feels like "have NO enegy".        Exercises      General  Comments        Pertinent Vitals/Pain Pain Assessment Pain Assessment: Faces Faces Pain Scale: Hurts little more Pain Location: "gut" Pain Descriptors / Indicators: Grimacing Pain Intervention(s): Monitored during session    Home Living                          Prior Function            PT Goals (current goals can now be found in the care plan section) Progress towards PT goals: Progressing toward goals    Frequency    Min 2X/week      PT Plan Discharge plan needs to be updated    Co-evaluation              AM-PAC PT "6 Clicks" Mobility   Outcome Measure  Help needed turning from your back to your side while in a flat bed without using bedrails?: A Little Help needed moving from lying on your back to sitting on the side of a flat bed without using bedrails?: A Little Help needed moving to and from a bed to a chair (including a wheelchair)?: A Little Help needed standing up from a chair using your arms (e.g., wheelchair or bedside chair)?: A Lot Help needed to walk in hospital room?: A Lot Help needed climbing 3-5 steps with a railing? : A Lot 6 Click Score: 15    End of Session Equipment Utilized During Treatment: Gait belt Activity Tolerance: Patient limited by fatigue Patient left: in bed;with bed alarm set;with call bell/phone within reach;with family/visitor present Nurse Communication: Mobility status PT Visit Diagnosis: Unsteadiness on feet (R26.81);Muscle weakness (generalized) (M62.81);Difficulty in walking, not elsewhere classified (R26.2);Pain     Time: 1547-1611 PT Time Calculation (min) (ACUTE ONLY): 24 min  Charges:  $Gait Training: 8-22 mins $Therapeutic Activity: 8-22 mins                     Rica Koyanagi  PTA Acute  Rehabilitation Services Pager      [phone] Office      [phone]

## 2022-05-12 NOTE — Progress Notes (Signed)
Occupational Therapy Treatment Patient Details Name: Zachary Bowers MRN: [id number] DOB: 03-02-1956 Today's Date: 05/12/2022   History of present illness 66 y.o. male with medical history significant of CAD s/p stent, HTN, HLD, DM2 appendectomy. Pt with recent history of stomach cancer s/p gastric resection and feeding tube.  On chemo therapy due to positive nodes.  Presented with 1 month history of nausea and vomiting with abdominal pain.  Progressed to intractable nausea and vomiting.    CT scan that confirmed significant metastatic disease almost essentially replacing his liver.Patient admitted for starting salvage chemotherapy.   OT comments  Patients session was limited with increased HR to 133 bpm with transfer from edge of bed to recliner in room. Patient was noted to have improved posture with transfer with RW on this date. Patient was noted to have decreased functional activity tolerance, decreased endurance impacting participation in ADLs. Patient's discharge plan remains appropriate at this time. OT will continue to follow acutely.     Recommendations for follow up therapy are one component of a multi-disciplinary discharge planning process, led by the attending physician.  Recommendations may be updated based on patient status, additional functional criteria and insurance authorization.    Follow Up Recommendations  Home health OT    Assistance Recommended at Discharge Frequent or constant Supervision/Assistance  Patient can return home with the following  A lot of help with walking and/or transfers;A lot of help with bathing/dressing/bathroom;Assistance with cooking/housework;Direct supervision/assist for financial management;Assist for transportation;Help with stairs or ramp for entrance;Direct supervision/assist for medications management   Equipment Recommendations  BSC/3in1    Recommendations for Other Services      Precautions / Restrictions Precautions Precautions:  Fall Precaution Comments: N/V, chemo precautions Restrictions Weight Bearing Restrictions: No       Mobility Bed Mobility Overal bed mobility: Needs Assistance       Supine to sit: Mod assist     General bed mobility comments: assist with BUEs to pull up to sit, assist to pivot hips to EOB    Transfers                         Balance                                           ADL either performed or assessed with clinical judgement   ADL Overall ADL's : Needs assistance/impaired                     Lower Body Dressing: Maximal assistance Lower Body Dressing Details (indicate cue type and reason): to don bilateral socks at bed level with patient making attempts to engage in task but unable to complete               General ADL Comments: patient was noted to have HR of 133 bpm with transition from edge of bed to recliner in room with patietn noted to have more upright posture opn this date with smoother sequencing of BLE to reach recliner. patient reported feeling HR increase. patient was positioed in chair to attempt to reduce pressure on abdomen from upright positioning with HR continuiong to range 120-122 with increased rest time. nurse in room towards end of session updated on patients HR during session and discomfort with OOB. patients nurse verbalized understanding.    Extremity/Trunk Assessment  Vision       Perception     Praxis      Cognition Arousal/Alertness: Awake/alert Behavior During Therapy: WFL for tasks assessed/performed, Flat affect Overall Cognitive Status: Within Functional Limits for tasks assessed                                 General Comments: patient's wife and daughter present in room to offer encouragement        Exercises      Shoulder Instructions       General Comments      Pertinent Vitals/ Pain       Pain Assessment Pain Assessment: Faces Faces  Pain Scale: Hurts little more Pain Location: "gut" Pain Descriptors / Indicators: Discomfort Pain Intervention(s): Limited activity within patient's tolerance, Monitored during session, Repositioned  Home Living                                          Prior Functioning/Environment              Frequency  Min 2X/week        Progress Toward Goals  OT Goals(current goals can now be found in the care plan section)  Progress towards OT goals: Progressing toward goals     Plan Discharge plan remains appropriate    Co-evaluation                 AM-PAC OT "6 Clicks" Daily Activity     Outcome Measure   Help from another person eating meals?: A Little Help from another person taking care of personal grooming?: A Little Help from another person toileting, which includes using toliet, bedpan, or urinal?: A Lot Help from another person bathing (including washing, rinsing, drying)?: A Lot Help from another person to put on and taking off regular upper body clothing?: A Lot Help from another person to put on and taking off regular lower body clothing?: A Lot 6 Click Score: 14    End of Session Equipment Utilized During Treatment: Rolling walker (2 wheels);Gait belt  OT Visit Diagnosis: Unsteadiness on feet (R26.81);Other abnormalities of gait and mobility (R26.89);Muscle weakness (generalized) (M62.81)   Activity Tolerance Patient tolerated treatment well   Patient Left in chair;with call bell/phone within reach;with family/visitor present;with nursing/sitter in room   Nurse Communication Mobility status        Time: 2992-4268 OT Time Calculation (min): 28 min  Charges: OT General Charges $OT Visit: 1 Visit OT Treatments $Therapeutic Activity: 23-37 mins  Jackelyn Poling OTR/L, MS Acute Rehabilitation Department Office# [phone] Pager# [phone]   Marcellina Millin 05/12/2022, 2:45 PM

## 2022-05-12 NOTE — Progress Notes (Addendum)
[facility] PROGRESS NOTE    Progress Note  Zachary Bowers  UMP:[id number] DOB: [birth year] DOA: 04/25/2022 PCP: Shirline Frees, MD     Brief Narrative:   Zachary Bowers is an 66 y.o. male past medical history significant for CAD status post stents, essential hypertension, diabetes mellitus type 2, recently diagnosed with gastric cancer status post gastric resection and feeding tube on chemotherapy due to node positive.  Presents with 1 month history of nausea and vomiting and abdominal pain which is intractable CT raise concern for hepatic metastases.  Oncology and interventional radiology has been consulted.    Assessment/Plan:   Intractable  Nausea & vomiting in the setting of  Gastric cancer [facility]) CT scan of the abdomen pelvis was negative for small bowel obstruction. Suggest to be tubings placed. Nausea and vomiting likely due to metastatic disease seen on imaging and his constipation. He was started on MiraLAX and Reglan. Patient has had bowel movements and nausea seems to be improving.  Gastric cancer with metastatic hepatic disease new lesion:  GI on board, giving consideration for adjuvant immunotherapy, initially he was thought not to be a good candidate due to physical deconditioning and weight loss. Patient was started on chemotherapy FOLFOX/Herceptin on 05/07/2022. PICC line in place. Physical therapy evaluated the patient recommended skilled, but the family would like to take him home with home health.  Acute kidney injury: Likely prerenal azotemia. As he was not at goal with his tube feedings IV fluids were continued ACE inhibitor's on hold.  Status post jejunostomy tube/nutrition: CT showed no obstructions, jejunostomy tube in place patient having difficulty tolerating his feedings. Nutrition has been consulted and hopefully feeding rate can be increased restarted on Reglan to assist. He is positive about 7 L. Advanced 10 mL of into his feedings every 24  hours. Patient is currently on 20 mL of his tube feedings and he relates he is full, increase Reglan.   Transaminitis Likely due to liver metastases. No biliary dilation seen on CT. LFTs continue to be elevated.  Concern for infection: CT of abdomen and pelvis showed no source. Procalcitonin was noted to be elevated in the setting of cancer. He remained afebrile and leukocytosis. No obvious source of infection, Zosyn was discontinued 05/08/2022.  Diabetes mellitus type 2: With an A1c of 6.6 continue sliding scale insulin.  Normocytic anemia: Likely due to malignancy.  Essential hypertension/hyponatremia: Continue amlodipine, holding Avapro. Continue to monitor blood pressure.  Hypothyroidism: Continue Synthroid.  Hyperlipidemia: Continue statins.  Severe physical deconditioning: Physical therapy evaluated the patient, family would like to take him home instead of skilled nursing facility.   Nutrition: Estimated body mass index is 25.24 kg/m as calculated from the following:   Height as of this encounter: 6' (1.829 m).   Weight as of this encounter: 84.4 kg. Malnutrition Type:  Nutrition Problem: Severe Malnutrition Etiology: chronic illness, cancer and cancer related treatments   Malnutrition Characteristics:  Signs/Symptoms: energy intake < or equal to 75% for > or equal to 1 month, percent weight loss, moderate fat depletion, mild fat depletion, moderate muscle depletion   Nutrition Interventions:  Interventions: Tube feeding, Prostat, MVI    DVT prophylaxis: lovenox Family Communication:wife Status is: Inpatient Remains inpatient appropriate because: Until we can increase his feedings.    Code Status:     Code Status Orders  (From admission, onward)           Start     Ordered   05/04/2022 2246  Full code  Continuous  04/24/2022 2245           Code Status History     Date Active Date Inactive Code Status Order ID Comments User  Context   02/11/2022 1532 02/20/2022 1855 Full Code [id number]  [name], MD Inpatient   10/08/2021 0736 10/11/2021 1557 Full Code [id number]  Neena Rhymes, MD ED   08/11/2021 1007 08/11/2021 2014 Full Code [id number]  Jettie Booze, MD Inpatient         IV Access:   Peripheral IV   Procedures and diagnostic studies:   No results found.   Medical Consultants:   None.   Subjective:    [name] denies any abdominal pain.  Objective:    Vitals:   05/11/22 1358 05/11/22 1718 05/11/22 1949 05/12/22 0405  BP: 114/76 128/85 125/84 124/81  Pulse: (!) 105 (!) 106 (!) 101 (!) 102  Resp: '18 16 18 18  '$ Temp: (!) 97.3 F (36.3 C) 97.6 F (36.4 C) (!) 97.5 F (36.4 C) 97.6 F (36.4 C)  TempSrc: Oral Oral Oral Oral  SpO2: 97% 97% 98% 98%  Weight:      Height:       SpO2: 98 %   Intake/Output Summary (Last 24 hours) at 05/12/2022 0841 Last data filed at 05/12/2022 0500 Gross per 24 hour  Intake 1293.23 ml  Output 1000 ml  Net 293.23 ml   Filed Weights   04/22/2022 1618 05/08/22 0407  Weight: 77.1 kg 84.4 kg    Exam: General exam: In no acute distress. Respiratory system: Good air movement and clear to auscultation. Cardiovascular system: S1 & S2 heard, RRR. No JVD. Gastrointestinal system: Abdomen is nondistended, soft and nontender.  Extremities: No pedal edema. Skin: No rashes, lesions or ulcers Psychiatry: Judgement and insight appear normal. Mood & affect appropriate.    Data Reviewed:    Labs: Basic Metabolic Panel: Recent Labs  Lab 05/06/22 0636 05/06/22 1743 05/07/22 0425 05/07/22 1700 05/08/22 0544 05/09/22 0443 05/10/22 0436 05/11/22 0616 05/12/22 0500  NA 138  --  136  --  132* 132* 138 140 141  K 4.2  --  4.1  --  3.9 3.6 3.8 3.6 3.7  CL 100  --  101  --  99 100 105 106 108  CO2 27  --  25  --  '24 22 24 26 27  '$ GLUCOSE 90  --  106*  --  154* 209* 169* 122* 107*  BUN 23  --  24*  --  34* 55* 60* 56* 43*  CREATININE  1.07  --  0.99  --  1.10 1.27* 1.23 0.96 0.81  CALCIUM 9.4  --  8.8*  --  8.3* 8.0* 8.6* 8.7* 8.8*  MG 2.4 2.2 2.3 1.8  --   --   --   --   --   PHOS 3.8 3.4 3.4 2.9  --   --   --   --   --    GFR Estimated Creatinine Clearance: 98.5 mL/min (by C-G formula based on SCr of 0.81 mg/dL). Liver Function Tests: Recent Labs  Lab 05/08/22 0544 05/09/22 0443 05/10/22 0436 05/11/22 0616 05/12/22 0500  AST 796* 699* 542* 407* 369*  ALT 258* 263* 245* 238* 237*  ALKPHOS 513* 690* 733* 664* 658*  BILITOT 1.7* 1.6* 1.9* 2.4* 3.1*  PROT 5.9* 6.0* 6.3* 6.1* 5.9*  ALBUMIN 2.2* 2.2* 2.2* 2.2* 2.2*   Recent Labs  Lab 05/04/2022 1838  LIPASE 57*   Recent Labs  Lab 05/08/2022 2213  AMMONIA 25   Coagulation profile Recent Labs  Lab 05/10/2022 1838  INR 1.0   COVID-19 Labs  No results for input(s): DDIMER, FERRITIN, LDH, CRP in the last 72 hours.  Lab Results  Component Value Date   SARSCOV2NAA NEGATIVE 02/10/2022   [place] NEGATIVE 10/08/2021    CBC: Recent Labs  Lab 05/07/2022 1434 05/02/2022 1838 05/06/22 0542 05/07/22 0425 05/08/22 0544 05/09/22 0443 05/10/22 0436 05/11/22 0616  WBC 7.2 7.9   < > 7.7 8.9 8.5 7.8 8.3  NEUTROABS 5.4 6.7  --   --   --  7.8*  --   --   HGB 11.8* 11.5*   < > 9.6* 8.2* 8.5* 8.6* 8.0*  HCT 38.0* 36.6*   < > 29.6* 26.2* 26.5* 26.1* 25.2*  MCV 79.8* 80.1   < > 81.3 81.4 80.8 80.1 80.5  PLT 488* 459*   < > 368 300 317 285 258   < > = values in this interval not displayed.   Cardiac Enzymes: No results for input(s): CKTOTAL, CKMB, CKMBINDEX, TROPONINI in the last 168 hours. BNP (last 3 results) No results for input(s): PROBNP in the last 8760 hours. CBG: Recent Labs  Lab 05/11/22 1649 05/11/22 1951 05/12/22 0000 05/12/22 0407 05/12/22 0724  GLUCAP 158* 159* 113* 102* 106*   D-Dimer: No results for input(s): DDIMER in the last 72 hours. Hgb A1c: No results for input(s): HGBA1C in the last 72 hours. Lipid Profile: No results for  input(s): CHOL, HDL, LDLCALC, TRIG, CHOLHDL, LDLDIRECT in the last 72 hours. Thyroid function studies: No results for input(s): TSH, T4TOTAL, T3FREE, THYROIDAB in the last 72 hours.  Invalid input(s): FREET3 Anemia work up: No results for input(s): VITAMINB12, FOLATE, FERRITIN, TIBC, IRON, RETICCTPCT in the last 72 hours. Sepsis Labs: Recent Labs  Lab 04/26/2022 1838 05/04/2022 2030 05/06/22 0542 05/06/22 0636 05/07/22 0425 05/08/22 0544 05/09/22 0443 05/10/22 0436 05/11/22 0616  PROCALCITON  --   --   --  0.68 1.05 1.45  --   --   --   WBC 7.9  --    < >  --  7.7 8.9 8.5 7.8 8.3  LATICACIDVEN 2.4* 2.0*  --   --   --   --   --   --   --    < > = values in this interval not displayed.   Microbiology Recent Results (from the past 240 hour(s))  Blood culture (routine x 2)     Status: None   Collection Time: 05/01/2022  6:38 PM   Specimen: BLOOD  Result Value Ref Range Status   Specimen Description   Final    BLOOD RIGHT ANTECUBITAL Performed at [facility], [address], [place], [place] [address]    Special Requests   Final    BOTTLES DRAWN AEROBIC AND ANAEROBIC Blood Culture adequate volume Performed at [facility], [address] [place], [place] [address]    Culture   Final    NO GROWTH 5 DAYS Performed at [facility], [place] [address], [place], [place] [address]    Report Status 05/10/2022 FINAL  Final  Blood culture (routine x 2)     Status: None   Collection Time: 04/27/2022 10:13 PM   Specimen: BLOOD  Result Value Ref Range Status   Specimen Description   Final    BLOOD BLOOD LEFT HAND Performed at [place] [address] [place], [place] [address]  Special Requests   Final    IN PEDIATRIC BOTTLE Blood Culture results may not be optimal due to an inadequate volume of blood received in culture bottles Performed at [place] [address], [place], [place] [address]     Culture   Final    NO GROWTH 5 DAYS Performed at [facility], [place] [address] [place], [place] [address]    Report Status 05/10/2022 FINAL  Final     Medications:    amLODipine  10 mg Per Tube Daily   aspirin  81 mg Per Tube Daily   Chlorhexidine Gluconate Cloth  6 each Topical Daily   enoxaparin (LOVENOX) injection  40 mg Subcutaneous Q24H   feeding supplement (PROSource TF)  90 mL Per Tube TID   feeding supplement (VITAL 1.5 CAL)  1,000 mL Per Tube Q24H   insulin aspart  0-15 Units Subcutaneous Q4H   insulin glargine-yfgn  5 Units Subcutaneous QHS   levothyroxine  125 mcg Per Tube QAC breakfast   metoCLOPramide (REGLAN) injection  5 mg Intravenous Q8H   sennosides  10 mL Per Tube BID   sodium chloride flush  10-40 mL Intracatheter Q12H   Continuous Infusions:  sodium chloride     famotidine (PEPCID) IV (ONCOLOGY)     lactated ringers 75 mL/hr at 05/11/22 1711      LOS: 6 days   Charlynne Cousins  [facility]  05/12/2022, 8:41 AM

## 2022-05-12 NOTE — Care Management Important Message (Signed)
Important Message  Patient Details IM Letter given to the Patient. Name: Zachary Bowers MRN: [id number] Date of Birth: 1956/01/29   Medicare Important Message Given:  Yes     Kerin Salen 05/12/2022, 10:21 AM

## 2022-05-12 NOTE — Progress Notes (Signed)
HEMATOLOGY-ONCOLOGY PROGRESS NOTE  ASSESSMENT AND PLAN: Gastric cancer CT abdomen/pelvis 10/08/2021-probable mass in the proximal stomach, enlarged gastropathic ligament node Upper endoscopy 10/01/2021-gastric cardia/fundus mass with oozing and stigmata of recent bleeding-biopsy at least intramucosal adenocarcinoma, mismatch repair protein expression intact, PD-L1 combined positive score 7%, HER2 positive PET 10/20/2021-hypermetabolic proximal gastric primary, or hypermetabolic gastropathic ligament node, right paramidline prostatic hypermetabolism, anal hypermetabolism without a CT mass Radiation 11/11/2021-12/22/2021 Cycle 1  Taxol/carboplatin 11/12/2021 Cycle 2 Taxol/carboplatin 11/19/2021 Cycle 3 Taxol/carboplatin 11/26/2021 Cycle 4 Taxol/carboplatin 12/03/2021 Cycle 5 Taxol/carboplatin 12/18/2021 PET 01/19/2022-decreased hypermetabolism at the gastric cardia, decreased size and metabolic activity associated with an enlarged gastrohepatic node, persistent anal hypermetabolism, stable lateral left lower lobe nodule, possible new more medial subpleural left lower lobe nodule-both nodules below PET resolution 02/11/2022-total gastrectomy with Roux-en-Y esophagojejunostomy, placement of jejunal feeding tube,ypT2,ypN1 moderate to poorly differentiated adenocarcinoma at the GE junction measuring 2.5 x 2.3 cm, lymphovascular invasion present, negative resection margins, 2/28 lymph nodes CT abdomen/pelvis with contrast 05/04/2022-innumerable hepatic metastases which are new, portacaval node suspicious for nodal metastasis. Cycle 1 FOLFOX/trastuzumab 05/07/2022   Microcytic anemia secondary to #1 Diabetes Hypertension Coronary artery disease, cardiac catheterization 08/11/2021-placement of proximal and distal right coronary artery stents Hypothyroidism Sleep apnea Anal hypermetabolism on the PET 10/20/2021-no mass identified Hospital admission 05/08/2022-nausea and vomiting, transaminitis.  Zachary Bowers is now  at day 6 following cycle 1 FOLFOX/trastuzumab.  He is overall status appears unchanged.  He continues to have a poor performance status.  He understands he will need to increase his nutrition intake and ambulation in order to return home.  The liver enzymes are stable and the bilirubin remains elevated.  Cycle 2 FOLFOX/trastuzumab is planned for 05/24/2022.    Recommendations: 1.  Continue oxycodone as needed for pain 2.  Advance diet as tolerated.  Continue J-tube feeding. 3.  Increase time out of bed 4.  TOC referral for home equipment. 5.  Outpatient follow-up at the Cancer center for cycle 2 FOLFOX/Herceptin will be scheduled for 05/24/2022 6.  Consider stopping telemetry and decreasing CBG checks 7.  Okay to discontinue daily labs   Betsy Coder, MD    SUBJECTIVE: Zachary Bowers reports mild abdominal discomfort.  No new complaint.  He reports getting out of bed.  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[place])    10/28/2021 Cancer Staging   Staging form: Stomach, AJCC 8th Edition - Clinical: Stage Unknown (cTX, cN1, cM0) - Signed by Ladell Pier, MD on 10/28/2021    11/12/2021 - 12/18/2021 Chemotherapy   Patient is on Treatment Plan : ESOPHAGUS Carboplatin/PACLitaxel weekly x 6 weeks with XRT        05/07/2022 -  Chemotherapy   Patient is on Treatment Plan : GASTROESOPHAGEAL FOLFOX D1,15 + Trastuzumab (6/4) D1,15 + Pembrolizumab (200) every 3 weeks, q28d      Gastric cancer ([place])  02/11/2022 Initial Diagnosis   Gastric cancer ([place])    03/04/2022 Cancer Staging   Staging form: Stomach, AJCC 8th Edition - Pathologic: Stage IIA (pT2, pN1, cM0) - Signed by Ladell Pier, MD on 03/04/2022 Histologic grade (G): G2 Histologic grading system: 3 grade system Residual tumor (R): R0 - None    04/02/2022 -  Chemotherapy   Patient is on Treatment Plan : HEAD/NECK Nivolumab q14d         PHYSICAL EXAMINATION:  Vitals:   05/11/22 1949 05/12/22 0405  BP: 125/84 124/81  Pulse: (!) 101 (!) 102  Resp: 18 18  Temp: (!) 97.5 F (36.4 C) 97.6 F (36.4 C)  SpO2: 98% 98%   Filed Weights   05/10/2022 1618 05/08/22 0407  Weight: 170 lb (77.1 kg) 186 lb 1.1 oz (84.4 kg)    Intake/Output from previous day: 05/30 0701 - 05/31 0700 In: 1293.2 [P.O.:40; I.V.:1253.2] Out: 1000 [Urine:1000]  Physical exam: HEENT-no thrush Lungs-clear anteriorly Cardiac-regular rate and rhythm Abdomen-the liver is palpable in the right upper abdomen, left upper quadrant feeding tube Vascular-no leg edema  LABORATORY DATA:  I have reviewed the data as listed    Latest Ref Rng & Units 05/12/2022    5:00 AM 05/11/2022    6:16 AM 05/10/2022    4:36 AM  CMP  Glucose 70 - 99 mg/dL 107   122   169    BUN 8 - 23 mg/dL 43   56   60    Creatinine 0.61 - 1.24 mg/dL 0.81   0.96   1.23    Sodium 135 - 145 mmol/L 141   140   138    Potassium 3.5 - 5.1 mmol/L 3.7   3.6   3.8    Chloride 98 - 111 mmol/L 108   106   105    CO2 22 - 32 mmol/L _0 Calcium 8.9 - 10.3 mg/dL 8.8   8.7   8.6    Total Protein 6.5 - 8.1 g/dL 5.9   6.1   6.3    Total Bilirubin 0.3 - 1.2 mg/dL 3.1   2.4   1.9    Alkaline Phos 38 - 126 U/L 658   664   733    AST 15 - 41 U/L 369   407   542    ALT 0 - 44 U/L 237   238   245      Lab Results  Component Value Date   WBC 8.3 05/11/2022   HGB 8.0 (L) 05/11/2022   HCT 25.2 (L) 05/11/2022   MCV 80.5 05/11/2022   PLT 258 05/11/2022   NEUTROABS 7.8 (H) 05/09/2022    Lab Results  Component Value Date   CEA 1.50 10/30/2021   PSA1 2.7 10/30/2021    CT ABDOMEN PELVIS W CONTRAST  Result Date: 05/08/2022 CLINICAL DATA:  Weakness, elevated LFTs, history of gastric cancer status post gastrectomy EXAM: CT ABDOMEN AND PELVIS WITH CONTRAST TECHNIQUE: Multidetector CT imaging of the abdomen and pelvis was performed using the standard protocol following bolus  administration of intravenous contrast. RADIATION DOSE REDUCTION: This exam was performed according to the departmental dose-optimization program which includes automated exposure control, adjustment of the mA and/or kV according to patient size and/or use of iterative reconstruction technique. CONTRAST:  13m OMNIPAQUE IOHEXOL 300 MG/ML  SOLN COMPARISON:  PET-CT dated 01/18/2022 FINDINGS: Lower chest: Trace bilateral pleural effusions, right greater than left. Associated right lower lobe atelectasis. Hepatobiliary: Innumerable hepatic metastases in both lobes, new. Dominant aggregate lesion measures 8.8 cm in the lateral segment left hepatic lobe (series 2/image 25). Index lesion in the inferior right hepatic lobe measures 4.8 cm (series 2/image 50). Gallbladder is notable for layering sludge (series 2/image 52), without associated inflammatory changes. No intrahepatic or extrahepatic duct dilatation. Pancreas: Within normal limits. Spleen: Within normal limits. Adrenals/Urinary Tract: Adrenal glands are within normal limits. Kidneys are within normal limits.  No hydronephrosis. Bladder is mildly thick-walled although underdistended. Stomach/Bowel: Status post gastrectomy.  Percutaneous jejunostomy.  No evidence of bowel obstruction. Appendix is not discretely visualized, reportedly surgically absent. No colonic wall thickening or mass is evident on CT. Vascular/Lymphatic: No evidence of abdominal aortic aneurysm. 14 mm short axis node in the portacaval region (series 2/image 33). Reproductive: Prostate is unremarkable. Other: Moderate abdominopelvic ascites, minimally complex. Musculoskeletal: Mild degenerative changes of the lumbar spine. IMPRESSION: Status post gastrectomy with percutaneous jejunostomy. Innumerable hepatic metastases, new, with index lesions as above. 14 mm short axis portacaval node, suspicious for nodal metastasis. Moderate abdominopelvic ascites. Trace bilateral pleural effusions.  Electronically Signed   By: Julian Hy M.D.   On: 04/29/2022 19:28   [place] EKG SITE RITE  Result Date: 05/06/2022 If Site Rite image not attached, placement could not be confirmed due to current cardiac rhythm.    No future appointments.     LOS: 6 days

## 2022-05-13 DIAGNOSIS — R1084 Generalized abdominal pain: Secondary | ICD-10-CM | POA: Diagnosis not present

## 2022-05-13 DIAGNOSIS — C160 Malignant neoplasm of cardia: Secondary | ICD-10-CM | POA: Diagnosis not present

## 2022-05-13 DIAGNOSIS — R112 Nausea with vomiting, unspecified: Secondary | ICD-10-CM | POA: Diagnosis not present

## 2022-05-13 DIAGNOSIS — I10 Essential (primary) hypertension: Secondary | ICD-10-CM | POA: Diagnosis not present

## 2022-05-13 DIAGNOSIS — E119 Type 2 diabetes mellitus without complications: Secondary | ICD-10-CM | POA: Diagnosis not present

## 2022-05-13 LAB — GLUCOSE, CAPILLARY
Glucose-Capillary: 105 mg/dL — ABNORMAL HIGH (ref 70–99)
Glucose-Capillary: 119 mg/dL — ABNORMAL HIGH (ref 70–99)
Glucose-Capillary: 129 mg/dL — ABNORMAL HIGH (ref 70–99)
Glucose-Capillary: 136 mg/dL — ABNORMAL HIGH (ref 70–99)
Glucose-Capillary: 139 mg/dL — ABNORMAL HIGH (ref 70–99)
Glucose-Capillary: 174 mg/dL — ABNORMAL HIGH (ref 70–99)

## 2022-05-13 NOTE — Progress Notes (Addendum)
HEMATOLOGY-ONCOLOGY PROGRESS NOTE  ASSESSMENT AND PLAN: Gastric cancer CT abdomen/pelvis 10/08/2021-probable mass in the proximal stomach, enlarged gastropathic ligament node Upper endoscopy 10/01/2021-gastric cardia/fundus mass with oozing and stigmata of recent bleeding-biopsy at least intramucosal adenocarcinoma, mismatch repair protein expression intact, PD-L1 combined positive score 7%, HER2 positive PET 10/20/2021-hypermetabolic proximal gastric primary, or hypermetabolic gastropathic ligament node, right paramidline prostatic hypermetabolism, anal hypermetabolism without a CT mass Radiation 11/11/2021-12/22/2021 Cycle 1  Taxol/carboplatin 11/12/2021 Cycle 2 Taxol/carboplatin 11/19/2021 Cycle 3 Taxol/carboplatin 11/26/2021 Cycle 4 Taxol/carboplatin 12/03/2021 Cycle 5 Taxol/carboplatin 12/18/2021 PET 01/19/2022-decreased hypermetabolism at the gastric cardia, decreased size and metabolic activity associated with an enlarged gastrohepatic node, persistent anal hypermetabolism, stable lateral left lower lobe nodule, possible new more medial subpleural left lower lobe nodule-both nodules below PET resolution 02/11/2022-total gastrectomy with Roux-en-Y esophagojejunostomy, placement of jejunal feeding tube,ypT2,ypN1 moderate to poorly differentiated adenocarcinoma at the GE junction measuring 2.5 x 2.3 cm, lymphovascular invasion present, negative resection margins, 2/28 lymph nodes CT abdomen/pelvis with contrast 04/19/2022-innumerable hepatic metastases which are new, portacaval node suspicious for nodal metastasis. Cycle 1 FOLFOX/trastuzumab 05/07/2022   Microcytic anemia secondary to #1 Diabetes Hypertension Coronary artery disease, cardiac catheterization 08/11/2021-placement of proximal and distal right coronary artery stents Hypothyroidism Sleep apnea Anal hypermetabolism on the PET 10/20/2021-no mass identified Hospital admission 04/16/2022-nausea and vomiting, transaminitis.  Zachary Bowers is now  at day 7 following cycle 1 FOLFOX/trastuzumab.  He appears to have tolerated the chemotherapy without significant acute toxicity.  The elevated liver enzymes were last checked on 5/30 and remained stable.   We discussed nutrition today.  He was again encouraged to try to maintain his J-tube feeding for as long as possible.  Further discussed that if he cannot maintain his nutrition, he may not be a candidate for additional chemotherapy.  We do not recommend TPN.  He is currently on Reglan and would consider discontinuing this.  His pain is currently well controlled.  Continue current pain medication regimen.  He was encouraged to get out of bed is much as possible.  Recommendations: 1.  Continue current pain medication. 2.  Diet as tolerated.  Continue J-tube feeding as tolerated. 3.  Increase time out of bed 4.  Check CBC with differential in AM 5.  Outpatient follow-up at the Cancer center for cycle 2 FOLFOX/Herceptin will be scheduled for 05/24/2022  [name], NP   Zachary Bowers was interviewed and examined.  He appears unchanged.  I encouraged him to increase his oral nutrition intake and continue the tube feedings as tolerated.  It is unclear why he is not been able to tolerate the tube feedings.  We can consider TPN, but this is not an ideal long-term solution in a patient with advanced metastatic gastric cancer.  I was present for greater than 50% of today's visit.  I performed medical decision making.  SUBJECTIVE: Zachary Bowers continues to report abdominal discomfort and "fullness" in his abdomen.  He requested for his tube feeding to be stopped overnight.  Denies nausea and vomiting.  He reports that he has been ambulating in the hallway several times a day.  No new complaints today.  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[place])    10/28/2021 Cancer Staging   Staging form:  Stomach, AJCC 8th Edition - Clinical: Stage Unknown (cTX, cN1, cM0) - Signed by Ladell Pier, MD on 10/28/2021    11/12/2021 - 12/18/2021 Chemotherapy   Patient  is on Treatment Plan : ESOPHAGUS Carboplatin/PACLitaxel weekly x 6 weeks with XRT        05/07/2022 -  Chemotherapy   Patient is on Treatment Plan : GASTROESOPHAGEAL FOLFOX D1,15 + Trastuzumab (6/4) D1,15 + Pembrolizumab (200) every 3 weeks, q28d      Gastric cancer ([place])  02/11/2022 Initial Diagnosis   Gastric cancer ([place])    03/04/2022 Cancer Staging   Staging form: Stomach, AJCC 8th Edition - Pathologic: Stage IIA (pT2, pN1, cM0) - Signed by Ladell Pier, MD on 03/04/2022 Histologic grade (G): G2 Histologic grading system: 3 grade system Residual tumor (R): R0 - None    04/02/2022 -  Chemotherapy   Patient is on Treatment Plan : HEAD/NECK Nivolumab q14d        PHYSICAL EXAMINATION:  Vitals:   05/12/22 1947 05/13/22 0408  BP: 117/78 114/78  Pulse: (!) 109 (!) 108  Resp: 18 18  Temp: 98 F (36.7 C) (!) 97.4 F (36.3 C)  SpO2: 97% 97%   Filed Weights   05/12/2022 1618 05/08/22 0407 05/13/22 0420  Weight: 77.1 kg 84.4 kg 91.9 kg    Intake/Output from previous day: 05/31 0701 - 06/01 0700 In: 293.7 [I.V.:293.7] Out: 401 [Urine:400; Stool:1]  Physical exam: HEENT-no thrush Lungs-clear anteriorly Cardiac-regular rate and rhythm Abdomen-the liver is palpable in the right upper abdomen, left upper quadrant feeding tube Vascular-no leg edema  LABORATORY DATA:  I have reviewed the data as listed    Latest Ref Rng & Units 05/12/2022    5:00 AM 05/11/2022    6:16 AM 05/10/2022    4:36 AM  CMP  Glucose 70 - 99 mg/dL 107   122   169    BUN 8 - 23 mg/dL 43   56   60    Creatinine 0.61 - 1.24 mg/dL 0.81   0.96   1.23    Sodium 135 - 145 mmol/L 141   140   138    Potassium 3.5 - 5.1 mmol/L 3.7   3.6   3.8    Chloride 98 - 111 mmol/L 108   106   105    CO2 22 - 32 mmol/L _0 Calcium 8.9 - 10.3  mg/dL 8.8   8.7   8.6    Total Protein 6.5 - 8.1 g/dL 5.9   6.1   6.3    Total Bilirubin 0.3 - 1.2 mg/dL 3.1   2.4   1.9    Alkaline Phos 38 - 126 U/L 658   664   733    AST 15 - 41 U/L 369   407   542    ALT 0 - 44 U/L 237   238   245      Lab Results  Component Value Date   WBC 8.3 05/11/2022   HGB 8.0 (L) 05/11/2022   HCT 25.2 (L) 05/11/2022   MCV 80.5 05/11/2022   PLT 258 05/11/2022   NEUTROABS 7.8 (H) 05/09/2022    Lab Results  Component Value Date   CEA 1.50 10/30/2021   PSA1 2.7 10/30/2021    CT ABDOMEN PELVIS W CONTRAST  Result Date: 04/26/2022 CLINICAL DATA:  Weakness, elevated LFTs, history of gastric cancer status post gastrectomy EXAM: CT ABDOMEN AND PELVIS WITH CONTRAST TECHNIQUE: Multidetector CT imaging of the abdomen and pelvis was performed using the standard protocol following bolus administration of intravenous contrast. RADIATION DOSE REDUCTION: This exam was performed according  to the departmental dose-optimization program which includes automated exposure control, adjustment of the mA and/or kV according to patient size and/or use of iterative reconstruction technique. CONTRAST:  30m OMNIPAQUE IOHEXOL 300 MG/ML  SOLN COMPARISON:  PET-CT dated 01/18/2022 FINDINGS: Lower chest: Trace bilateral pleural effusions, right greater than left. Associated right lower lobe atelectasis. Hepatobiliary: Innumerable hepatic metastases in both lobes, new. Dominant aggregate lesion measures 8.8 cm in the lateral segment left hepatic lobe (series 2/image 25). Index lesion in the inferior right hepatic lobe measures 4.8 cm (series 2/image 50). Gallbladder is notable for layering sludge (series 2/image 52), without associated inflammatory changes. No intrahepatic or extrahepatic duct dilatation. Pancreas: Within normal limits. Spleen: Within normal limits. Adrenals/Urinary Tract: Adrenal glands are within normal limits. Kidneys are within normal limits.  No hydronephrosis. Bladder is  mildly thick-walled although underdistended. Stomach/Bowel: Status post gastrectomy. Percutaneous jejunostomy.  No evidence of bowel obstruction. Appendix is not discretely visualized, reportedly surgically absent. No colonic wall thickening or mass is evident on CT. Vascular/Lymphatic: No evidence of abdominal aortic aneurysm. 14 mm short axis node in the portacaval region (series 2/image 33). Reproductive: Prostate is unremarkable. Other: Moderate abdominopelvic ascites, minimally complex. Musculoskeletal: Mild degenerative changes of the lumbar spine. IMPRESSION: Status post gastrectomy with percutaneous jejunostomy. Innumerable hepatic metastases, new, with index lesions as above. 14 mm short axis portacaval node, suspicious for nodal metastasis. Moderate abdominopelvic ascites. Trace bilateral pleural effusions. Electronically Signed   By: SJulian HyM.D.   On: 05/07/2022 19:28   [place]KG SITE RITE  Result Date: 05/06/2022 If Site Rite image not attached, placement could not be confirmed due to current cardiac rhythm.    Future Appointments  Date Time Provider [place] 05/24/2022  8:15 AM [facility] PHLEBOTOMIST [facility] None  05/24/2022  8:30 AM [facility] FLUSH ROOM [facility] None  05/24/2022  8:45 AM TOwens Shark NP [facility] None  05/24/2022 10:00 AM [facility] CHAIR 1 [facility] None  05/26/2022  1:00 PM [facility] FLUSH ROOM [facility] None       LOS: 7 days

## 2022-05-13 NOTE — Plan of Care (Signed)
Patient asked to have tube feeds stopped at 0120 because he was feeling full. Offered to restart tube feeds at 0330 but patient declined, stating he could still taste the tube feeds. He had just gotten up to use the bathroom and had a large watery stool. He requested pain medication for abdominal pain and IV morphine was administered.

## 2022-05-13 NOTE — Progress Notes (Signed)
       Subjective: No acute changes. Still not tolerating tube feeds for more than a few hours at a time at a low rate, due to bloating. Having bowel movements, denies nausea. Tube feeds are off this morning.   Objective: Vital signs in last 24 hours: Temp:  [97.4 F (36.3 C)-98 F (36.7 C)] 97.4 F (36.3 C) (06/01 0408) Pulse Rate:  [108-117] 108 (06/01 0408) Resp:  [16-18] 18 (06/01 0408) BP: (109-120)/(73-80) 114/78 (06/01 0408) SpO2:  [95 %-98 %] 97 % (06/01 0408) Weight:  [91.9 kg] 91.9 kg (06/01 0420) Last BM Date : 05/10/22  Intake/Output from previous day: 05/31 0701 - 06/01 0700 In: 293.7 [I.V.:293.7] Out: 401 [Urine:400; Stool:1] Intake/Output this shift: No intake/output data recorded.  PE: General: resting in bed, appears ill Neuro: alert and oriented, no focal deficits Resp: normal work of breathing on room air Abdomen: soft, minimally distended, nontender to palpation. J tube in place. Extremities: warm and well-perfused   Lab Results:  Recent Labs    05/11/22 0616  WBC 8.3  HGB 8.0*  HCT 25.2*  PLT 258   BMET Recent Labs    05/11/22 0616 05/12/22 0500  NA 140 141  K 3.6 3.7  CL 106 108  CO2 26 27  GLUCOSE 122* 107*  BUN 56* 43*  CREATININE 0.96 0.81  CALCIUM 8.7* 8.8*   PT/INR No results for input(s): LABPROT, INR in the last 72 hours. CMP     Component Value Date/Time   NA 141 05/12/2022 0500   NA 136 08/21/2021 1015   K 3.7 05/12/2022 0500   CL 108 05/12/2022 0500   CO2 27 05/12/2022 0500   GLUCOSE 107 (H) 05/12/2022 0500   BUN 43 (H) 05/12/2022 0500   BUN 23 08/21/2021 1015   CREATININE 0.81 05/12/2022 0500   CREATININE 0.89 04/30/2022 1434   CALCIUM 8.8 (L) 05/12/2022 0500   PROT 5.9 (L) 05/12/2022 0500   ALBUMIN 2.2 (L) 05/12/2022 0500   AST 369 (H) 05/12/2022 0500   AST 491 (HH) 05/12/2022 1434   ALT 237 (H) 05/12/2022 0500   ALT 151 (H) 05/03/2022 1434   ALKPHOS 658 (H) 05/12/2022 0500   BILITOT 3.1 (H) 05/12/2022  0500   BILITOT 2.6 (H) 04/27/2022 1434   GFRNONAA >60 05/12/2022 0500   GFRNONAA >60 04/30/2022 1434   Lipase     Component Value Date/Time   LIPASE 57 (H) 04/19/2022 1838       Studies/Results: No results found.    Assessment/Plan  66 yo male with gastric adenocarcinoma, s/p total gastrectomy with roux-en-Y esophagojejunostomy and feeding J tube on 02/11/22. Now admitted with nausea/vomiting and found to have metastatic disease to the liver. - Malnutrition: Patient has had weight loss since surgery and has not been able to tolerate goal feeds. He is also unable to meet his nutritional needs by mouth. Reglan is not likely to be very effective as patient has no stomach. If patient wishes to continue with aggressive care, would strongly consider TPN. - Surgery will follow    LOS: 7 days    Michaelle Birks, MD [facility] Surgery General, Hepatobiliary and Pancreatic Surgery 05/13/22 8:36 AM

## 2022-05-13 NOTE — Progress Notes (Signed)
[facility] PROGRESS NOTE    Progress Note  Zachary Bowers  DVV:[id number] DOB: [birth year] DOA: 04/12/2022 PCP: Shirline Frees, MD     Brief Narrative:   Zachary Bowers is an 66 y.o. male past medical history significant for CAD status post stents, essential hypertension, diabetes mellitus type 2, recently diagnosed with gastric cancer status post gastric resection and feeding tube on chemotherapy due to node positive.  Presents with 1 month history of nausea and vomiting and abdominal pain which is intractable CT raise concern for hepatic metastases.  Oncology and interventional radiology has been consulted.    Assessment/Plan:   Intractable  Nausea & vomiting in the setting of  Gastric cancer [facility]) CT scan of the abdomen pelvis was negative for small bowel obstruction. Suggest to be tubings placed. Nausea and vomiting likely due to metastatic disease seen on imaging and his constipation. He was started on MiraLAX and Reglan. Patient has had bowel movements and nausea seems to be improving.  Gastric cancer with metastatic hepatic disease new lesion:  GI on board, giving consideration for adjuvant immunotherapy, initially he was thought not to be a good candidate due to physical deconditioning and weight loss. Patient was started on chemotherapy FOLFOX/Herceptin on 05/07/2022. PICC line in place. Physical therapy evaluated the patient recommended skilled, but the family would like to take him home with home health.  Acute kidney injury: Likely prerenal azotemia. As he was not at goal with his tube feedings IV fluids were continued ACE inhibitor's on hold.  Status post jejunostomy tube/nutrition: CT showed no obstructions, jejunostomy tube in place patient having difficulty tolerating his feedings. Nutrition has been consulted and hopefully feeding rate can be increased restarted on Reglan to assist. He is positive about 7 L. He continues to stop the tube feedings every 4  hours, due to abdominal bloating and discomfort.  He is running about 20 mL and 4 hours.  We need to get him up to 50 mL an hour. We will increase 10 mL every 24 hours as tolerated.  I had a long discussion with him about tube feedings and he understands that if he does not tolerate the tube feedings we should get hospice involved.  Transaminitis Likely due to liver metastases. No biliary dilation seen on CT. LFTs continue to be elevated.  Concern for infection: CT of abdomen and pelvis showed no source. Procalcitonin was noted to be elevated in the setting of cancer. He remained afebrile and leukocytosis. No obvious source of infection, Zosyn was discontinued 05/08/2022.  Diabetes mellitus type 2: With an A1c of 6.6 continue sliding scale insulin.  Normocytic anemia: Likely due to malignancy.  Essential hypertension/hyponatremia: Continue amlodipine, holding Avapro. Continue to monitor blood pressure.  Hypothyroidism: Continue Synthroid.  Hyperlipidemia: Continue statins.  Severe physical deconditioning: Physical therapy evaluated the patient, family would like to take him home instead of skilled nursing facility.   Nutrition: Estimated body mass index is 27.48 kg/m as calculated from the following:   Height as of this encounter: 6' (1.829 m).   Weight as of this encounter: 91.9 kg. Malnutrition Type:  Nutrition Problem: Severe Malnutrition Etiology: chronic illness, cancer and cancer related treatments   Malnutrition Characteristics:  Signs/Symptoms: energy intake < or equal to 75% for > or equal to 1 month, percent weight loss, moderate fat depletion, mild fat depletion, moderate muscle depletion   Nutrition Interventions:  Interventions: Tube feeding, Prostat, MVI    DVT prophylaxis: lovenox Family Communication:wife Status is: Inpatient Remains inpatient appropriate because:  Until we can increase his feedings.    Code Status:     Code Status  Orders  (From admission, onward)           Start     Ordered   05/07/2022 2246  Full code  Continuous        05/04/2022 2245           Code Status History     Date Active Date Inactive Code Status Order ID Comments User Context   02/11/2022 1532 02/20/2022 1855 Full Code [id number]  [name], MD Inpatient   10/08/2021 0736 10/11/2021 1557 Full Code [id number]  Neena Rhymes, MD ED   08/11/2021 1007 08/11/2021 2014 Full Code [id number]  Jettie Booze, MD Inpatient         IV Access:   Peripheral IV   Procedures and diagnostic studies:   No results found.   Medical Consultants:   None.   Subjective:    Zachary Bowers complaining of abdominal pain with tube feedings.  Objective:    Vitals:   05/12/22 1631 05/12/22 1947 05/13/22 0408 05/13/22 0420  BP: 113/80 117/78 114/78   Pulse: (!) 109 (!) 109 (!) 108   Resp: '16 18 18   '$ Temp: 09.3 F (36.4 C) 98 F (36.7 C) (!) 97.4 F (36.3 C)   TempSrc: Oral Oral Oral   SpO2: 95% 97% 97%   Weight:    91.9 kg  Height:       SpO2: 97 %   Intake/Output Summary (Last 24 hours) at 05/13/2022 0918 Last data filed at 05/13/2022 0715 Gross per 24 hour  Intake 293.65 ml  Output 601 ml  Net -307.35 ml    Filed Weights   05/08/2022 1618 05/08/22 0407 05/13/22 0420  Weight: 77.1 kg 84.4 kg 91.9 kg    Exam: General exam: In no acute distress. Respiratory system: Good air movement and clear to auscultation. Cardiovascular system: S1 & S2 heard, RRR. No JVD. Gastrointestinal system: Abdomen is nondistended, soft and nontender.  Extremities: No pedal edema. Skin: No rashes, lesions or ulcers Psychiatry: Judgement and insight appear normal. Mood & affect appropriate.   Data Reviewed:    Labs: Basic Metabolic Panel: Recent Labs  Lab 05/06/22 1743 05/07/22 0425 05/07/22 0425 05/07/22 1700 05/08/22 0544 05/09/22 0443 05/10/22 0436 05/11/22 0616 05/12/22 0500  NA  --  136   < >  --  132* 132*  138 140 141  K  --  4.1   < >  --  3.9 3.6 3.8 3.6 3.7  CL  --  101   < >  --  99 100 105 106 108  CO2  --  25   < >  --  '24 22 24 26 27  '$ GLUCOSE  --  106*   < >  --  154* 209* 169* 122* 107*  BUN  --  24*   < >  --  34* 55* 60* 56* 43*  CREATININE  --  0.99   < >  --  1.10 1.27* 1.23 0.96 0.81  CALCIUM  --  8.8*   < >  --  8.3* 8.0* 8.6* 8.7* 8.8*  MG 2.2 2.3  --  1.8  --   --   --   --   --   PHOS 3.4 3.4  --  2.9  --   --   --   --   --    < > = values  in this interval not displayed.    GFR Estimated Creatinine Clearance: 98.5 mL/min (by C-G formula based on SCr of 0.81 mg/dL). Liver Function Tests: Recent Labs  Lab 05/08/22 0544 05/09/22 0443 05/10/22 0436 05/11/22 0616 05/12/22 0500  AST 796* 699* 542* 407* 369*  ALT 258* 263* 245* 238* 237*  ALKPHOS 513* 690* 733* 664* 658*  BILITOT 1.7* 1.6* 1.9* 2.4* 3.1*  PROT 5.9* 6.0* 6.3* 6.1* 5.9*  ALBUMIN 2.2* 2.2* 2.2* 2.2* 2.2*    No results for input(s): LIPASE, AMYLASE in the last 168 hours.  No results for input(s): AMMONIA in the last 168 hours.  Coagulation profile No results for input(s): INR, PROTIME in the last 168 hours.  COVID-19 Labs  No results for input(s): DDIMER, FERRITIN, LDH, CRP in the last 72 hours.  Lab Results  Component Value Date   SARSCOV2NAA NEGATIVE 02/10/2022   [place] NEGATIVE 10/08/2021    CBC: Recent Labs  Lab 05/07/22 0425 05/08/22 0544 05/09/22 0443 05/10/22 0436 05/11/22 0616  WBC 7.7 8.9 8.5 7.8 8.3  NEUTROABS  --   --  7.8*  --   --   HGB 9.6* 8.2* 8.5* 8.6* 8.0*  HCT 29.6* 26.2* 26.5* 26.1* 25.2*  MCV 81.3 81.4 80.8 80.1 80.5  PLT 368 300 317 285 258    Cardiac Enzymes: No results for input(s): CKTOTAL, CKMB, CKMBINDEX, TROPONINI in the last 168 hours. BNP (last 3 results) No results for input(s): PROBNP in the last 8760 hours. CBG: Recent Labs  Lab 05/12/22 1633 05/12/22 1949 05/13/22 0002 05/13/22 0405 05/13/22 0726  GLUCAP 137* 152* 119* 136* 105*     D-Dimer: No results for input(s): DDIMER in the last 72 hours. Hgb A1c: No results for input(s): HGBA1C in the last 72 hours. Lipid Profile: No results for input(s): CHOL, HDL, LDLCALC, TRIG, CHOLHDL, LDLDIRECT in the last 72 hours. Thyroid function studies: No results for input(s): TSH, T4TOTAL, T3FREE, THYROIDAB in the last 72 hours.  Invalid input(s): FREET3 Anemia work up: No results for input(s): VITAMINB12, FOLATE, FERRITIN, TIBC, IRON, RETICCTPCT in the last 72 hours. Sepsis Labs: Recent Labs  Lab 05/07/22 0425 05/08/22 0544 05/09/22 0443 05/10/22 0436 05/11/22 0616  PROCALCITON 1.05 1.45  --   --   --   WBC 7.7 8.9 8.5 7.8 8.3    Microbiology Recent Results (from the past 240 hour(s))  Blood culture (routine x 2)     Status: None   Collection Time: 04/28/2022  6:38 PM   Specimen: BLOOD  Result Value Ref Range Status   Specimen Description   Final    BLOOD RIGHT ANTECUBITAL Performed at [facility], [address], [place], [place] [address]    Special Requests   Final    BOTTLES DRAWN AEROBIC AND ANAEROBIC Blood Culture adequate volume Performed at [facility], [address], [place], [place] [address]    Culture   Final    NO GROWTH 5 DAYS Performed at [facility], [place] [address], [place], [place] [address]    Report Status 05/10/2022 FINAL  Final  Blood culture (routine x 2)     Status: None   Collection Time: 04/23/2022 10:13 PM   Specimen: BLOOD  Result Value Ref Range Status   Specimen Description   Final    BLOOD BLOOD LEFT HAND Performed at [facility] [address], [place], [place] [address]    Special Requests   Final    IN PEDIATRIC BOTTLE Blood  Culture results may not be optimal due to an inadequate volume of blood received in culture bottles Performed at [facility] [address], [place], [place] [address]    Culture   Final    NO GROWTH 5  DAYS Performed at [facility], [place] [address], [place], [place] [address]    Report Status 05/10/2022 FINAL  Final     Medications:    amLODipine  10 mg Per Tube Daily   aspirin  81 mg Per Tube Daily   Chlorhexidine Gluconate Cloth  6 each Topical Daily   enoxaparin (LOVENOX) injection  40 mg Subcutaneous Q24H   feeding supplement (PROSource TF)  90 mL Per Tube TID   feeding supplement (VITAL 1.5 CAL)  1,000 mL Per Tube Q24H   free water  100 mL Per Tube Q6H   insulin aspart  0-15 Units Subcutaneous Q4H   insulin glargine-yfgn  5 Units Subcutaneous QHS   levothyroxine  125 mcg Per Tube QAC breakfast   metoCLOPramide (REGLAN) injection  10 mg Intravenous Q8H   sennosides  10 mL Per Tube BID   sodium chloride flush  10-40 mL Intracatheter Q12H   Continuous Infusions:  sodium chloride     famotidine (PEPCID) IV (ONCOLOGY)        LOS: 7 days   Charlynne Cousins  [facility]  05/13/2022, 9:18 AM

## 2022-05-13 DEATH — deceased

## 2022-05-14 DIAGNOSIS — C160 Malignant neoplasm of cardia: Secondary | ICD-10-CM | POA: Diagnosis not present

## 2022-05-14 DIAGNOSIS — I10 Essential (primary) hypertension: Secondary | ICD-10-CM | POA: Diagnosis not present

## 2022-05-14 DIAGNOSIS — R112 Nausea with vomiting, unspecified: Secondary | ICD-10-CM | POA: Diagnosis not present

## 2022-05-14 DIAGNOSIS — E119 Type 2 diabetes mellitus without complications: Secondary | ICD-10-CM | POA: Diagnosis not present

## 2022-05-14 DIAGNOSIS — R1084 Generalized abdominal pain: Secondary | ICD-10-CM | POA: Diagnosis not present

## 2022-05-14 LAB — CBC WITH DIFFERENTIAL/PLATELET
Abs Immature Granulocytes: 0.24 10*3/uL — ABNORMAL HIGH (ref 0.00–0.07)
Basophils Absolute: 0 10*3/uL (ref 0.0–0.1)
Basophils Relative: 0 %
Eosinophils Absolute: 0 10*3/uL (ref 0.0–0.5)
Eosinophils Relative: 0 %
HCT: 24.8 % — ABNORMAL LOW (ref 39.0–52.0)
Hemoglobin: 7.8 g/dL — ABNORMAL LOW (ref 13.0–17.0)
Immature Granulocytes: 2 %
Lymphocytes Relative: 7 %
Lymphs Abs: 0.7 10*3/uL (ref 0.7–4.0)
MCH: 25.3 pg — ABNORMAL LOW (ref 26.0–34.0)
MCHC: 31.5 g/dL (ref 30.0–36.0)
MCV: 80.5 fL (ref 80.0–100.0)
Monocytes Absolute: 0.2 10*3/uL (ref 0.1–1.0)
Monocytes Relative: 2 %
Neutro Abs: 8.9 10*3/uL — ABNORMAL HIGH (ref 1.7–7.7)
Neutrophils Relative %: 89 %
Platelets: 197 10*3/uL (ref 150–400)
RBC: 3.08 MIL/uL — ABNORMAL LOW (ref 4.22–5.81)
RDW: 17.3 % — ABNORMAL HIGH (ref 11.5–15.5)
WBC: 10 10*3/uL (ref 4.0–10.5)
nRBC: 0.6 % — ABNORMAL HIGH (ref 0.0–0.2)

## 2022-05-14 LAB — GLUCOSE, CAPILLARY
Glucose-Capillary: 135 mg/dL — ABNORMAL HIGH (ref 70–99)
Glucose-Capillary: 141 mg/dL — ABNORMAL HIGH (ref 70–99)
Glucose-Capillary: 143 mg/dL — ABNORMAL HIGH (ref 70–99)
Glucose-Capillary: 170 mg/dL — ABNORMAL HIGH (ref 70–99)
Glucose-Capillary: 175 mg/dL — ABNORMAL HIGH (ref 70–99)
Glucose-Capillary: 176 mg/dL — ABNORMAL HIGH (ref 70–99)

## 2022-05-14 NOTE — Progress Notes (Signed)
Physical Therapy Treatment Patient Details Name: Zachary Bowers MRN: [id number] DOB: 02-Aug-1956 Today's Date: 05/14/2022   History of Present Illness Derris Millan is an 66 y.o. male past medical history significant for CAD status post stents, essential hypertension, diabetes mellitus type 2, recently diagnosed with gastric cancer status post gastric resection and feeding tube on chemotherapy due to node positive.  Presents with 1 month history of nausea and vomiting and abdominal pain which is intractable CT raise concern for hepatic metastases.  Oncology and interventional radiology has been consulted.    PT Comments    General Comments: AxO x 3 required MAX encouragement to participate.  Feels like "have NO enegy". Assisted OOB was difficult.  General bed mobility comments: required increased asisst this session.  profoundly weak. General transfer comment: required increased asisst this session of + 2 side by side and from elevated bed to complete sit to stand. General Gait Details: decreased amb distance due to increased weakness/ability.  Family assisted with following recliner. Positioned in reclined to comfort.   Instructed NT pt would like to go back to bed "about hour".  Family is hoping pt will be able to D/C to home.  Pt is NOT progressing with his mobility and currently needs + 2 assist.    Recommendations for follow up therapy are one component of a multi-disciplinary discharge planning process, led by the attending physician.  Recommendations may be updated based on patient status, additional functional criteria and insurance authorization.  Follow Up Recommendations  Home health PT (family hoping Pt is able to go home)  LPT rec eval    Assistance Recommended at Discharge Frequent or constant Supervision/Assistance  Patient can return home with the following A lot of help with walking and/or transfers;Help with stairs or ramp for entrance;Assist for transportation;Assistance with  cooking/housework;A lot of help with bathing/dressing/bathroom   Equipment Recommendations  None recommended by PT    Recommendations for Other Services       Precautions / Restrictions Precautions Precautions: Fall Precaution Comments: N/V, chemo precautions Restrictions Weight Bearing Restrictions: No     Mobility  Bed Mobility Overal bed mobility: Needs Assistance Bed Mobility: Supine to Sit     Supine to sit: Max assist     General bed mobility comments: required increased asisst this session.  profoundly weak.    Transfers Overall transfer level: Needs assistance Equipment used: Rolling walker (2 wheels) Transfers: Sit to/from Stand Sit to Stand: Max assist, +2 physical assistance, +2 safety/equipment           General transfer comment: required increased asisst this session of + 2 side by side and from elevated bed to complete sit to stand.    Ambulation/Gait Ambulation/Gait assistance: Mod assist, +2 physical assistance, +2 safety/equipment Gait Distance (Feet): 8 Feet Assistive device: Rolling walker (2 wheels) Gait Pattern/deviations: Step-to pattern, Decreased step length - right, Decreased step length - left, Shuffle Gait velocity: decreased     General Gait Details: decreased amb distance due to increased weakness/ability.  Family assisted with following recliner.   Stairs             Wheelchair Mobility    Modified Rankin (Stroke Patients Only)       Balance                                            Cognition Arousal/Alertness: Awake/alert Behavior  During Therapy: Flat affect Overall Cognitive Status: Within Functional Limits for tasks assessed                                 General Comments: AxO x 3 required MAX encouragement to participate.  Feels like "have NO enegy".        Exercises      General Comments        Pertinent Vitals/Pain Pain Assessment Pain Assessment:  Faces Faces Pain Scale: Hurts little more Pain Location: ABD Pain Descriptors / Indicators: Grimacing Pain Intervention(s): Monitored during session    Home Living                          Prior Function            PT Goals (current goals can now be found in the care plan section) Progress towards PT goals: Progressing toward goals    Frequency    Min 2X/week      PT Plan Discharge plan needs to be updated    Co-evaluation              AM-PAC PT "6 Clicks" Mobility   Outcome Measure  Help needed turning from your back to your side while in a flat bed without using bedrails?: A Lot Help needed moving from lying on your back to sitting on the side of a flat bed without using bedrails?: A Lot Help needed moving to and from a bed to a chair (including a wheelchair)?: A Lot Help needed standing up from a chair using your arms (e.g., wheelchair or bedside chair)?: A Lot Help needed to walk in hospital room?: A Lot Help needed climbing 3-5 steps with a railing? : Total 6 Click Score: 11    End of Session Equipment Utilized During Treatment: Gait belt Activity Tolerance: Patient limited by fatigue Patient left: in chair;with chair alarm set;with call bell/phone within reach;with family/visitor present Nurse Communication: Mobility status PT Visit Diagnosis: Unsteadiness on feet (R26.81);Muscle weakness (generalized) (M62.81);Difficulty in walking, not elsewhere classified (R26.2);Pain     Time: 1217-1242 PT Time Calculation (min) (ACUTE ONLY): 25 min  Charges:  $Gait Training: 8-22 mins $Therapeutic Activity: 8-22 mins                     {Inez Stantz  PTA Acute  Rehabilitation Services Pager      [phone] Office      [phone]

## 2022-05-14 NOTE — TOC Progression Note (Signed)
Transition of Care [facility]) - Progression Note    Patient Details  Name: Zachary Bowers MRN: [id number] Date of Birth: [birth year]  Transition of Care [facility]) CM/SW Contact  Darion Juhasz, Marjie Skiff, RN Phone Number: 05/14/2022, 11:53 AM  Clinical Narrative:     TOC continues to follow along. Pt is receiving the same continuous tube feeds he was receiving prior to admission so no new needs related to the tube feeds. Plan is still for home with [facility] for [facility].  Expected Discharge Plan: [place] Barriers to Discharge: Continued Medical Work up  Expected Discharge Plan and Services Expected Discharge Plan: [place] In-house Referral: Clinical Social Work   Post Acute Care Choice: Durable Medical Equipment, [place] arrangements for the past 2 months: Single Family Home                 DME Arranged: Walker rolling DME Agency: [employer] Date DME Agency Contacted: 05/10/22 Time DME Agency Contacted: 1137 Representative spoke with at DME Agency: Andee Poles HH Arranged: PT, OT [place] Agency: [place] Date [place]: 05/11/22 Time [place]: [place] Representative spoke with at [place]: [place] ([place]) Interventions    Readmission Risk Interventions    05/10/2022   11:43 AM  Readmission Risk Prevention Plan  HRI or Home Care Consult Complete  Social Work Consult for [place] Planning/Counseling Complete  Palliative Care Screening Not Applicable

## 2022-05-14 NOTE — Progress Notes (Signed)
[facility] PROGRESS NOTE    Progress Note  Zachary Bowers  CBS:[id number] DOB: Aug 23, 1956 DOA: 05/06/2022 PCP: Shirline Frees, MD     Brief Narrative:   Zachary Bowers is an 66 y.o. male past medical history significant for CAD status post stents, essential hypertension, diabetes mellitus type 2, recently diagnosed with gastric cancer status post gastric resection and feeding tube on chemotherapy due to node positive.  Presents with 1 month history of nausea and vomiting and abdominal pain which is intractable CT raise concern for hepatic metastases.  Oncology and interventional radiology has been consulted.    Assessment/Plan:   Intractable  Nausea & vomiting in the setting of  Gastric cancer [facility]) CT scan of the abdomen pelvis was negative for small bowel obstruction. Suggest to be tubings placed. Nausea and vomiting likely due to metastatic disease seen on imaging and his constipation. Having regular bowel movements. Tolerated his tube feed increased by 10 every 24 hours.  On top of this he is eating 3 meals a day.  Gastric cancer with metastatic hepatic disease new lesion:  GI on board, giving consideration for adjuvant immunotherapy, initially he was thought not to be a good candidate due to physical deconditioning and weight loss. Patient was started on chemotherapy FOLFOX/Herceptin on 05/07/2022. PICC line in place. Physical therapy evaluated the patient recommended skilled, but the family would like to take him home with home health.  Acute kidney injury: Likely prerenal azotemia. As he was not at goal with his tube feedings IV fluids were continued ACE inhibitor's on hold.  Status post jejunostomy tube/nutrition: CT showed no obstructions, jejunostomy tube in place patient having difficulty tolerating his feedings. Nutrition has been consulted and hopefully feeding rate can be increased restarted on Reglan to assist. He is positive about 7 L. Tolerating his tube  feeds for over 16 hours increase to 10 mL every 24 hours.  Transaminitis Likely due to liver metastases. No biliary dilation seen on CT. LFTs continue to be elevated.  Concern for infection: CT of abdomen and pelvis showed no source. Procalcitonin was noted to be elevated in the setting of cancer. He remained afebrile and leukocytosis. No obvious source of infection, Zosyn was discontinued 05/08/2022.  Diabetes mellitus type 2: With an A1c of 6.6 continue sliding scale insulin.  Normocytic anemia: Likely due to malignancy.  Essential hypertension/hyponatremia: Continue amlodipine, holding Avapro. Continue to monitor blood pressure.  Hypothyroidism: Continue Synthroid.  Hyperlipidemia: Continue statins.  Severe physical deconditioning: Physical therapy evaluated the patient, family would like to take him home instead of skilled nursing facility.   Nutrition: Estimated body mass index is 27.75 kg/m as calculated from the following:   Height as of this encounter: 6' (1.829 m).   Weight as of this encounter: 92.8 kg. Malnutrition Type:  Nutrition Problem: Severe Malnutrition Etiology: chronic illness, cancer and cancer related treatments   Malnutrition Characteristics:  Signs/Symptoms: energy intake < or equal to 75% for > or equal to 1 month, percent weight loss, moderate fat depletion, mild fat depletion, moderate muscle depletion   Nutrition Interventions:  Interventions: Tube feeding, Prostat, MVI    DVT prophylaxis: lovenox Family Communication:wife Status is: Inpatient Remains inpatient appropriate because: Until we can increase his feedings.    Code Status:     Code Status Orders  (From admission, onward)           Start     Ordered   04/23/2022 2246  Full code  Continuous  04/27/2022 2245           Code Status History     Date Active Date Inactive Code Status Order ID Comments User Context   02/11/2022 1532 02/20/2022 1855 Full  Code [id number]  [name], MD Inpatient   10/08/2021 0736 10/11/2021 1557 Full Code [id number]  Neena Rhymes, MD ED   08/11/2021 1007 08/11/2021 2014 Full Code [id number]  Jettie Booze, MD Inpatient         IV Access:   Peripheral IV   Procedures and diagnostic studies:   No results found.   Medical Consultants:   None.   Subjective:    Zachary Bowers tolerated his diet and his tube feedings.  Objective:    Vitals:   05/14/22 0236 05/14/22 0416 05/14/22 0710 05/14/22 0808  BP: 107/68 106/72  109/75  Pulse: (!) 118 (!) 119  (!) 110  Resp: '18 18  16  '$ Temp: 98.6 F (37 C) (!) 97.4 F (36.3 C)  97.7 F (36.5 C)  TempSrc: Oral Oral  Oral  SpO2: 96% 100%  94%  Weight:   92.8 kg   Height:       SpO2: 94 %   Intake/Output Summary (Last 24 hours) at 05/14/2022 0856 Last data filed at 05/14/2022 0705 Gross per 24 hour  Intake 490 ml  Output 410 ml  Net 80 ml    Filed Weights   05/08/22 0407 05/13/22 0420 05/14/22 0710  Weight: 84.4 kg 91.9 kg 92.8 kg    Exam: General exam: In no acute distress. Respiratory system: Good air movement and clear to auscultation. Cardiovascular system: S1 & S2 heard, RRR. No JVD. Gastrointestinal system: Abdomen is nondistended, soft and nontender.  Extremities: No pedal edema. Skin: No rashes, lesions or ulcers Psychiatry: Judgement and insight appear normal. Mood & affect appropriate.   Data Reviewed:    Labs: Basic Metabolic Panel: Recent Labs  Lab 05/07/22 1700 05/08/22 0544 05/08/22 0544 05/09/22 0443 05/10/22 0436 05/11/22 0616 05/12/22 0500  NA  --  132*  --  132* 138 140 141  K  --  3.9   < > 3.6 3.8 3.6 3.7  CL  --  99  --  100 105 106 108  CO2  --  24  --  '22 24 26 27  '$ GLUCOSE  --  154*  --  209* 169* 122* 107*  BUN  --  34*  --  55* 60* 56* 43*  CREATININE  --  1.10  --  1.27* 1.23 0.96 0.81  CALCIUM  --  8.3*  --  8.0* 8.6* 8.7* 8.8*  MG 1.8  --   --   --   --   --   --   PHOS 2.9   --   --   --   --   --   --    < > = values in this interval not displayed.    GFR Estimated Creatinine Clearance: 98.5 mL/min (by C-G formula based on SCr of 0.81 mg/dL). Liver Function Tests: Recent Labs  Lab 05/08/22 0544 05/09/22 0443 05/10/22 0436 05/11/22 0616 05/12/22 0500  AST 796* 699* 542* 407* 369*  ALT 258* 263* 245* 238* 237*  ALKPHOS 513* 690* 733* 664* 658*  BILITOT 1.7* 1.6* 1.9* 2.4* 3.1*  PROT 5.9* 6.0* 6.3* 6.1* 5.9*  ALBUMIN 2.2* 2.2* 2.2* 2.2* 2.2*    No results for input(s): LIPASE, AMYLASE in the last 168 hours.  No results for input(s):  AMMONIA in the last 168 hours.  Coagulation profile No results for input(s): INR, PROTIME in the last 168 hours.  COVID-19 Labs  No results for input(s): DDIMER, FERRITIN, LDH, CRP in the last 72 hours.  Lab Results  Component Value Date   SARSCOV2NAA NEGATIVE 02/10/2022   [place] NEGATIVE 10/08/2021    CBC: Recent Labs  Lab 05/08/22 0544 05/09/22 0443 05/10/22 0436 05/11/22 0616 05/14/22 0506  WBC 8.9 8.5 7.8 8.3 10.0  NEUTROABS  --  7.8*  --   --  8.9*  HGB 8.2* 8.5* 8.6* 8.0* 7.8*  HCT 26.2* 26.5* 26.1* 25.2* 24.8*  MCV 81.4 80.8 80.1 80.5 80.5  PLT 300 317 285 258 197    Cardiac Enzymes: No results for input(s): CKTOTAL, CKMB, CKMBINDEX, TROPONINI in the last 168 hours. BNP (last 3 results) No results for input(s): PROBNP in the last 8760 hours. CBG: Recent Labs  Lab 05/13/22 1627 05/13/22 2020 05/14/22 0003 05/14/22 0419 05/14/22 0802  GLUCAP 139* 129* 143* 141* 135*    D-Dimer: No results for input(s): DDIMER in the last 72 hours. Hgb A1c: No results for input(s): HGBA1C in the last 72 hours. Lipid Profile: No results for input(s): CHOL, HDL, LDLCALC, TRIG, CHOLHDL, LDLDIRECT in the last 72 hours. Thyroid function studies: No results for input(s): TSH, T4TOTAL, T3FREE, THYROIDAB in the last 72 hours.  Invalid input(s): FREET3 Anemia work up: No results for input(s):  VITAMINB12, FOLATE, FERRITIN, TIBC, IRON, RETICCTPCT in the last 72 hours. Sepsis Labs: Recent Labs  Lab 05/08/22 0544 05/09/22 0443 05/10/22 0436 05/11/22 0616 05/14/22 0506  PROCALCITON 1.45  --   --   --   --   WBC 8.9 8.5 7.8 8.3 10.0    Microbiology Recent Results (from the past 240 hour(s))  Blood culture (routine x 2)     Status: None   Collection Time: 05/04/2022  6:38 PM   Specimen: BLOOD  Result Value Ref Range Status   Specimen Description   Final    BLOOD RIGHT ANTECUBITAL Performed at [facility], [address] [place], [place] [address]    Special Requests   Final    BOTTLES DRAWN AEROBIC AND ANAEROBIC Blood Culture adequate volume Performed at [facility], [address], [place], [place] [address]    Culture   Final    NO GROWTH 5 DAYS Performed at [facility], [place] [address] [place], [place] [address]    Report Status 05/10/2022 FINAL  Final  Blood culture (routine x 2)     Status: None   Collection Time: 05/12/2022 10:13 PM   Specimen: BLOOD  Result Value Ref Range Status   Specimen Description   Final    BLOOD BLOOD LEFT HAND Performed at [facility] [address] [place], [place] [address]    Special Requests   Final    IN PEDIATRIC BOTTLE Blood Culture results may not be optimal due to an inadequate volume of blood received in culture bottles Performed at [facility] [address], [place], [place] [address]    Culture   Final    NO GROWTH 5 DAYS Performed at [facility], [place] [address], [place], [place] [address]    Report Status 05/10/2022 FINAL  Final     Medications:    amLODipine  10 mg Per Tube Daily   aspirin  81 mg Per Tube Daily   Chlorhexidine Gluconate Cloth  6 each Topical Daily  enoxaparin (LOVENOX) injection  40 mg Subcutaneous Q24H   feeding supplement (PROSource TF)  90 mL Per Tube TID   feeding supplement (VITAL 1.5 CAL)   1,000 mL Per Tube Q24H   free water  100 mL Per Tube Q6H   insulin aspart  0-15 Units Subcutaneous Q4H   insulin glargine-yfgn  5 Units Subcutaneous QHS   levothyroxine  125 mcg Per Tube QAC breakfast   metoCLOPramide (REGLAN) injection  10 mg Intravenous Q8H   sennosides  10 mL Per Tube BID   sodium chloride flush  10-40 mL Intracatheter Q12H   Continuous Infusions:  sodium chloride     famotidine (PEPCID) IV (ONCOLOGY)        LOS: 8 days   Charlynne Cousins  [facility]  05/14/2022, 8:56 AM

## 2022-05-14 NOTE — Progress Notes (Addendum)
HEMATOLOGY-ONCOLOGY PROGRESS NOTE  ASSESSMENT AND PLAN: Gastric cancer CT abdomen/pelvis 10/08/2021-probable mass in the proximal stomach, enlarged gastropathic ligament node Upper endoscopy 10/01/2021-gastric cardia/fundus mass with oozing and stigmata of recent bleeding-biopsy at least intramucosal adenocarcinoma, mismatch repair protein expression intact, PD-L1 combined positive score 7%, HER2 positive PET 10/20/2021-hypermetabolic proximal gastric primary, or hypermetabolic gastropathic ligament node, right paramidline prostatic hypermetabolism, anal hypermetabolism without a CT mass Radiation 11/11/2021-12/22/2021 Cycle 1  Taxol/carboplatin 11/12/2021 Cycle 2 Taxol/carboplatin 11/19/2021 Cycle 3 Taxol/carboplatin 11/26/2021 Cycle 4 Taxol/carboplatin 12/03/2021 Cycle 5 Taxol/carboplatin 12/18/2021 PET 01/19/2022-decreased hypermetabolism at the gastric cardia, decreased size and metabolic activity associated with an enlarged gastrohepatic node, persistent anal hypermetabolism, stable lateral left lower lobe nodule, possible new more medial subpleural left lower lobe nodule-both nodules below PET resolution 02/11/2022-total gastrectomy with Roux-en-Y esophagojejunostomy, placement of jejunal feeding tube,ypT2,ypN1 moderate to poorly differentiated adenocarcinoma at the GE junction measuring 2.5 x 2.3 cm, lymphovascular invasion present, negative resection margins, 2/28 lymph nodes CT abdomen/pelvis with contrast 05/02/2022-innumerable hepatic metastases which are new, portacaval node suspicious for nodal metastasis. Cycle 1 FOLFOX/trastuzumab 05/07/2022   Microcytic anemia secondary to #1 Diabetes Hypertension Coronary artery disease, cardiac catheterization 08/11/2021-placement of proximal and distal right coronary artery stents Hypothyroidism Sleep apnea Anal hypermetabolism on the PET 10/20/2021-no mass identified Hospital admission 04/16/2022-nausea and vomiting, transaminitis.  Zachary Bowers is now  at day 8 following cycle 1 FOLFOX/trastuzumab.  He appears to have tolerated the chemotherapy without significant acute toxicity.  The elevated liver enzymes were last checked on 5/30 and remained stable.  CBC from this morning has been reviewed and his white blood cell count remains normal, hemoglobin stable, platelets normal.  Plan is to administer a second cycle of chemotherapy as an outpatient on 05/24/2022  He tolerated his J-tube feeding better overnight.  Recommend continuation of J-tube feeding.  He will continue this at home.  His pain is currently well controlled.  Continue current pain medication regimen.  He was encouraged to get out of bed is much as possible.  Recommendations: 1.  Continue current pain medication. 2.  Diet as tolerated.  Continue J-tube feeding, increase to goal rate as tolerated 3.  Increase time out of bed 4.  Okay to discharge from home over the weekend from our standpoint.  Outpatient follow-up at the Cancer center for cycle 2 FOLFOX/Herceptin will be scheduled for 05/24/2022.    Zachary Bussing, NP   Zachary Bowers was interviewed and examined.  He appears more alert today.  He had nausea this morning.  No other complaint.  He is tolerating the tube feedings. Please call Oncology as needed.  I will check on him 05/17/2022 if he remains in the hospital.  I was present for greater than 50% of today's visit.  I performed medical decision making.      SUBJECTIVE: Zachary Bowers was able to tolerate tube feedings overnight.  Has been trying to eat more by mouth and ambulating more.  Pain controlled.  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[place])    10/28/2021 Cancer Staging   Staging form: Stomach, AJCC 8th Edition - Clinical: Stage Unknown (cTX, cN1, cM0) - Signed by Ladell Pier, MD on 10/28/2021    11/12/2021 - 12/18/2021 Chemotherapy   Patient is on Treatment  Plan : ESOPHAGUS Carboplatin/PACLitaxel weekly x 6 weeks with XRT        05/07/2022 -  Chemotherapy   Patient is on  Treatment Plan : GASTROESOPHAGEAL FOLFOX D1,15 + Trastuzumab (6/4) D1,15 + Pembrolizumab (200) every 3 weeks, q28d      Gastric cancer ([facility])  02/11/2022 Initial Diagnosis   Gastric cancer ([place])    03/04/2022 Cancer Staging   Staging form: Stomach, AJCC 8th Edition - Pathologic: Stage IIA (pT2, pN1, cM0) - Signed by Ladell Pier, MD on 03/04/2022 Histologic grade (G): G2 Histologic grading system: 3 grade system Residual tumor (R): R0 - None    04/02/2022 -  Chemotherapy   Patient is on Treatment Plan : HEAD/NECK Nivolumab q14d        PHYSICAL EXAMINATION:  Vitals:   05/14/22 0416 05/14/22 0808  BP: 106/72 109/75  Pulse: (!) 119 (!) 110  Resp: 18 16  Temp: (!) 97.4 F (36.3 C) 97.7 F (36.5 C)  SpO2: 100% 94%   Filed Weights   05/08/22 0407 05/13/22 0420 05/14/22 0710  Weight: 84.4 kg 91.9 kg 92.8 kg    Intake/Output from previous day: 06/01 0701 - 06/02 0700 In: 490 [P.O.:360; I.V.:10; NG/GT:120] Out: 410 [Urine:410]  Physical exam: HEENT-no thrush Lungs-clear anteriorly Cardiac-regular rate and rhythm Abdomen-the liver is palpable in the right upper abdomen, left upper quadrant feeding tube Vascular-no leg edema  LABORATORY DATA:  I have reviewed the data as listed    Latest Ref Rng & Units 05/12/2022    5:00 AM 05/11/2022    6:16 AM 05/10/2022    4:36 AM  CMP  Glucose 70 - 99 mg/dL 107   122   169    BUN 8 - 23 mg/dL 43   56   60    Creatinine 0.61 - 1.24 mg/dL 0.81   0.96   1.23    Sodium 135 - 145 mmol/L 141   140   138    Potassium 3.5 - 5.1 mmol/L 3.7   3.6   3.8    Chloride 98 - 111 mmol/L 108   106   105    CO2 22 - 32 mmol/L _0 Calcium 8.9 - 10.3 mg/dL 8.8   8.7   8.6    Total Protein 6.5 - 8.1 g/dL 5.9   6.1   6.3    Total Bilirubin 0.3 - 1.2 mg/dL 3.1   2.4   1.9    Alkaline Phos 38 - 126 U/L 658   664   733     AST 15 - 41 U/L 369   407   542    ALT 0 - 44 U/L 237   238   245      Lab Results  Component Value Date   WBC 10.0 05/14/2022   HGB 7.8 (L) 05/14/2022   HCT 24.8 (L) 05/14/2022   MCV 80.5 05/14/2022   PLT 197 05/14/2022   NEUTROABS 8.9 (H) 05/14/2022    Lab Results  Component Value Date   CEA 1.50 10/30/2021   PSA1 2.7 10/30/2021    CT ABDOMEN PELVIS W CONTRAST  Result Date: 04/22/2022 CLINICAL DATA:  Weakness, elevated LFTs, history of gastric cancer status post gastrectomy EXAM: CT ABDOMEN AND PELVIS WITH CONTRAST TECHNIQUE: Multidetector CT imaging of the abdomen and pelvis was performed using the standard protocol following bolus administration of intravenous contrast. RADIATION DOSE REDUCTION: This exam was performed according to the departmental dose-optimization program which includes automated exposure control, adjustment of the mA and/or kV according to patient size and/or use of iterative reconstruction technique. CONTRAST:  11m OMNIPAQUE IOHEXOL 300 MG/ML  SOLN COMPARISON:  PET-CT dated 01/18/2022 FINDINGS: Lower chest: Trace bilateral pleural effusions, right greater than left. Associated right lower lobe atelectasis. Hepatobiliary: Innumerable hepatic metastases in both lobes, new. Dominant aggregate lesion measures 8.8 cm in the lateral segment left hepatic lobe (series 2/image 25). Index lesion in the inferior right hepatic lobe measures 4.8 cm (series 2/image 50). Gallbladder is notable for layering sludge (series 2/image 52), without associated inflammatory changes. No intrahepatic or extrahepatic duct dilatation. Pancreas: Within normal limits. Spleen: Within normal limits. Adrenals/Urinary Tract: Adrenal glands are within normal limits. Kidneys are within normal limits.  No hydronephrosis. Bladder is mildly thick-walled although underdistended. Stomach/Bowel: Status post gastrectomy. Percutaneous jejunostomy.  No evidence of bowel obstruction. Appendix is not discretely  visualized, reportedly surgically absent. No colonic wall thickening or mass is evident on CT. Vascular/Lymphatic: No evidence of abdominal aortic aneurysm. 14 mm short axis node in the portacaval region (series 2/image 33). Reproductive: Prostate is unremarkable. Other: Moderate abdominopelvic ascites, minimally complex. Musculoskeletal: Mild degenerative changes of the lumbar spine. IMPRESSION: Status post gastrectomy with percutaneous jejunostomy. Innumerable hepatic metastases, new, with index lesions as above. 14 mm short axis portacaval node, suspicious for nodal metastasis. Moderate abdominopelvic ascites. Trace bilateral pleural effusions. Electronically Signed   By: SJulian HyM.D.   On: 04/23/2022 19:28   [place] SITE RITE  Result Date: 05/06/2022 If Site Rite image not attached, placement could not be confirmed due to current cardiac rhythm.    Future Appointments  Date Time Provider DDelta 05/24/2022  8:15 AM [facility] PHLEBOTOMIST [facility] None  05/24/2022  8:30 AM [facility] FLUSH ROOM [facility] None  05/24/2022  8:45 AM TOwens Shark NP [facility] None  05/24/2022 10:00 AM [facility] CHAIR 1 [facility] None  05/26/2022  1:00 PM [facility] FLUSH ROOM [facility] None       LOS: 8 days

## 2022-05-15 DIAGNOSIS — R112 Nausea with vomiting, unspecified: Secondary | ICD-10-CM | POA: Diagnosis not present

## 2022-05-15 DIAGNOSIS — C160 Malignant neoplasm of cardia: Secondary | ICD-10-CM | POA: Diagnosis not present

## 2022-05-15 LAB — GLUCOSE, CAPILLARY
Glucose-Capillary: 138 mg/dL — ABNORMAL HIGH (ref 70–99)
Glucose-Capillary: 142 mg/dL — ABNORMAL HIGH (ref 70–99)
Glucose-Capillary: 162 mg/dL — ABNORMAL HIGH (ref 70–99)
Glucose-Capillary: 171 mg/dL — ABNORMAL HIGH (ref 70–99)
Glucose-Capillary: 178 mg/dL — ABNORMAL HIGH (ref 70–99)
Glucose-Capillary: 204 mg/dL — ABNORMAL HIGH (ref 70–99)

## 2022-05-15 MED ORDER — ALUM & MAG HYDROXIDE-SIMETH 200-200-20 MG/5ML PO SUSP
15.0000 mL | ORAL | Status: DC | PRN
  Administered 2022-05-15: 15 mL via ORAL
  Filled 2022-05-15: qty 30

## 2022-05-15 MED ORDER — VITAL 1.5 CAL PO LIQD
1000.0000 mL | ORAL | Status: DC
  Administered 2022-05-15 – 2022-05-20 (×8): 1000 mL
  Filled 2022-05-15 (×9): qty 1000

## 2022-05-15 NOTE — Progress Notes (Signed)
   05/14/22 2215  Assess: MEWS Score  Temp 99.4 F (37.4 C)  BP 113/73  MAP (mmHg) 85  Pulse Rate (!) 115  Resp 18  SpO2 98 %  O2 Device Room Air  Assess: MEWS Score  MEWS Temp 0  MEWS Systolic 0  MEWS Pulse 2  MEWS RR 0  MEWS LOC 0  MEWS Score 2  MEWS Score Color Yellow  Assess: SIRS CRITERIA  SIRS Temperature  0  SIRS Pulse 1  SIRS Respirations  0  SIRS WBC 0  SIRS Score Sum  1   Patient previously yellow. No changes. Will take vital signs according to protocol

## 2022-05-15 NOTE — Progress Notes (Signed)
[facility] PROGRESS NOTE    Progress Note  Zachary Bowers  IWP:[id number] DOB: December 01, 1956 DOA: 05/10/2022 PCP: Shirline Frees, MD     Brief Narrative:   Zachary Bowers is an 66 y.o. male past medical history significant for CAD status post stents, essential hypertension, diabetes mellitus type 2, recently diagnosed with gastric cancer status post gastric resection and feeding tube on chemotherapy due to node positive.  Presents with 1 month history of nausea and vomiting and abdominal pain which is intractable CT raise concern for hepatic metastases.  Oncology and interventional radiology has been consulted.    Assessment/Plan:   Intractable  Nausea & vomiting in the setting of  Gastric cancer [facility]) CT scan of the abdomen pelvis was negative for small bowel obstruction. Suggest to be tubings placed. Nausea and vomiting likely due to metastatic disease seen on imaging and his constipation. Having regular bowel movements. Tolerating 30 we will go up to 10 mL if he tolerates it can probably be discharged tomorrow morning.  Gastric cancer with metastatic hepatic disease new lesion:  GI on board, giving consideration for adjuvant immunotherapy, initially he was thought not to be a good candidate due to physical deconditioning and weight loss. Patient was started on chemotherapy FOLFOX/Herceptin on 05/07/2022. PICC line in place. Physical therapy evaluated the patient recommended skilled, but the family would like to take him home with home health.  Acute kidney injury: Likely prerenal azotemia. As he was not at goal with his tube feedings IV fluids were continued ACE inhibitor's on hold.  Status post jejunostomy tube/nutrition: CT showed no obstructions, jejunostomy tube in place patient having difficulty tolerating his feedings. Nutrition has been consulted and hopefully feeding rate can be increased restarted on Reglan to assist. He is positive about 7 L. Tolerating his  tube feeds for over 16 hours increase to 10 mL every 24 hours.  Transaminitis Likely due to liver metastases. No biliary dilation seen on CT. LFTs continue to be elevated.  Concern for infection: CT of abdomen and pelvis showed no source. Procalcitonin was noted to be elevated in the setting of cancer. He remained afebrile and leukocytosis. No obvious source of infection, Zosyn was discontinued 05/08/2022.  Diabetes mellitus type 2: With an A1c of 6.6 continue sliding scale insulin.  Normocytic anemia: Likely due to malignancy.  Essential hypertension/hyponatremia: Continue amlodipine, holding Avapro. Continue to monitor blood pressure.  Hypothyroidism: Continue Synthroid.  Hyperlipidemia: Continue statins.  Severe physical deconditioning: Physical therapy evaluated the patient, family would like to take him home instead of skilled nursing facility.   Nutrition: Estimated body mass index is 27.84 kg/m as calculated from the following:   Height as of this encounter: 6' (1.829 m).   Weight as of this encounter: 93.1 kg. Malnutrition Type:  Nutrition Problem: Severe Malnutrition Etiology: chronic illness, cancer and cancer related treatments   Malnutrition Characteristics:  Signs/Symptoms: energy intake < or equal to 75% for > or equal to 1 month, percent weight loss, moderate fat depletion, mild fat depletion, moderate muscle depletion   Nutrition Interventions:  Interventions: Tube feeding, Prostat, MVI    DVT prophylaxis: lovenox Family Communication:wife Status is: Inpatient Remains inpatient appropriate because: Until we can increase his feedings.    Code Status:     Code Status Orders  (From admission, onward)           Start     Ordered   04/28/2022 2246  Full code  Continuous        04/16/2022 2245  Code Status History     Date Active Date Inactive Code Status Order ID Comments User Context   02/11/2022 1532 02/20/2022 1855  Full Code [id number]  [name], MD Inpatient   10/08/2021 0736 10/11/2021 1557 Full Code [id number]  Neena Rhymes, MD ED   08/11/2021 1007 08/11/2021 2014 Full Code [id number]  Jettie Booze, MD Inpatient         IV Access:   Peripheral IV   Procedures and diagnostic studies:   No results found.   Medical Consultants:   None.   Subjective:    Ulice Follett no complaints tolerating his tube feedings.  Objective:    Vitals:   05/14/22 1545 05/14/22 2215 05/15/22 0607 05/15/22 0653  BP: 113/79 113/73 105/76   Pulse: (!) 109 (!) 115 (!) 109   Resp: '17 18 16   '$ Temp: (!) 97.4 F (36.3 C) 99.4 F (37.4 C) 99.4 F (37.4 C)   TempSrc: Oral Oral Oral   SpO2: 95% 98% 99%   Weight:    93.1 kg  Height:       SpO2: 99 %   Intake/Output Summary (Last 24 hours) at 05/15/2022 0813 Last data filed at 05/15/2022 0521 Gross per 24 hour  Intake 1417.34 ml  Output 650 ml  Net 767.34 ml    Filed Weights   05/13/22 0420 05/14/22 0710 05/15/22 0653  Weight: 91.9 kg 92.8 kg 93.1 kg    Exam: General exam: In no acute distress. Respiratory system: Good air movement and clear to auscultation. Cardiovascular system: S1 & S2 heard, RRR. No JVD. Gastrointestinal system: Abdomen is nondistended, soft and nontender.  Extremities: No pedal edema. Skin: No rashes, lesions or ulcers Psychiatry: Judgement and insight appear normal. Mood & affect appropriate.   Data Reviewed:    Labs: Basic Metabolic Panel: Recent Labs  Lab 05/09/22 0443 05/10/22 0436 05/11/22 0616 05/12/22 0500  NA 132* 138 140 141  K 3.6 3.8 3.6 3.7  CL 100 105 106 108  CO2 '22 24 26 27  '$ GLUCOSE 209* 169* 122* 107*  BUN 55* 60* 56* 43*  CREATININE 1.27* 1.23 0.96 0.81  CALCIUM 8.0* 8.6* 8.7* 8.8*    GFR Estimated Creatinine Clearance: 98.5 mL/min (by C-G formula based on SCr of 0.81 mg/dL). Liver Function Tests: Recent Labs  Lab 05/09/22 0443 05/10/22 0436 05/11/22 0616  05/12/22 0500  AST 699* 542* 407* 369*  ALT 263* 245* 238* 237*  ALKPHOS 690* 733* 664* 658*  BILITOT 1.6* 1.9* 2.4* 3.1*  PROT 6.0* 6.3* 6.1* 5.9*  ALBUMIN 2.2* 2.2* 2.2* 2.2*    No results for input(s): LIPASE, AMYLASE in the last 168 hours.  No results for input(s): AMMONIA in the last 168 hours.  Coagulation profile No results for input(s): INR, PROTIME in the last 168 hours.  COVID-19 Labs  No results for input(s): DDIMER, FERRITIN, LDH, CRP in the last 72 hours.  Lab Results  Component Value Date   SARSCOV2NAA NEGATIVE 02/10/2022   [place] NEGATIVE 10/08/2021    CBC: Recent Labs  Lab 05/09/22 0443 05/10/22 0436 05/11/22 0616 05/14/22 0506  WBC 8.5 7.8 8.3 10.0  NEUTROABS 7.8*  --   --  8.9*  HGB 8.5* 8.6* 8.0* 7.8*  HCT 26.5* 26.1* 25.2* 24.8*  MCV 80.8 80.1 80.5 80.5  PLT 317 285 258 197    Cardiac Enzymes: No results for input(s): CKTOTAL, CKMB, CKMBINDEX, TROPONINI in the last 168 hours. BNP (last 3 results) No results for input(s): PROBNP  in the last 8760 hours. CBG: Recent Labs  Lab 05/14/22 1542 05/14/22 2022 05/15/22 0011 05/15/22 0436 05/15/22 0754  GLUCAP 176* 170* 162* 138* 142*    D-Dimer: No results for input(s): DDIMER in the last 72 hours. Hgb A1c: No results for input(s): HGBA1C in the last 72 hours. Lipid Profile: No results for input(s): CHOL, HDL, LDLCALC, TRIG, CHOLHDL, LDLDIRECT in the last 72 hours. Thyroid function studies: No results for input(s): TSH, T4TOTAL, T3FREE, THYROIDAB in the last 72 hours.  Invalid input(s): FREET3 Anemia work up: No results for input(s): VITAMINB12, FOLATE, FERRITIN, TIBC, IRON, RETICCTPCT in the last 72 hours. Sepsis Labs: Recent Labs  Lab 05/09/22 0443 05/10/22 0436 05/11/22 0616 05/14/22 0506  WBC 8.5 7.8 8.3 10.0    Microbiology Recent Results (from the past 240 hour(s))  Blood culture (routine x 2)     Status: None   Collection Time: 04/23/2022  6:38 PM   Specimen: BLOOD   Result Value Ref Range Status   Specimen Description   Final    BLOOD RIGHT ANTECUBITAL Performed at [facility], [address], [place], [place] [address]    Special Requests   Final    BOTTLES DRAWN AEROBIC AND ANAEROBIC Blood Culture adequate volume Performed at [facility], [address], [place], [place] [address]    Culture   Final    NO GROWTH 5 DAYS Performed at [facility], [place] [address] [place], [place] [address]    Report Status 05/10/2022 FINAL  Final  Blood culture (routine x 2)     Status: None   Collection Time: 04/15/2022 10:13 PM   Specimen: BLOOD  Result Value Ref Range Status   Specimen Description   Final    BLOOD BLOOD LEFT HAND Performed at [facility] [address], [place], [place] [address]    Special Requests   Final    IN PEDIATRIC BOTTLE Blood Culture results may not be optimal due to an inadequate volume of blood received in culture bottles Performed at [facility] [address] [place], [place] [address]    Culture   Final    NO GROWTH 5 DAYS Performed at [facility], [place] [address] [place],  [address]    Report Status 05/10/2022 FINAL  Final     Medications:    amLODipine  10 mg Per Tube Daily   aspirin  81 mg Per Tube Daily   Chlorhexidine Gluconate Cloth  6 each Topical Daily   enoxaparin (LOVENOX) injection  40 mg Subcutaneous Q24H   feeding supplement (PROSource TF)  90 mL Per Tube TID   feeding supplement (VITAL 1.5 CAL)  1,000 mL Per Tube Q24H   free water  100 mL Per Tube Q6H   insulin aspart  0-15 Units Subcutaneous Q4H   insulin glargine-yfgn  5 Units Subcutaneous QHS   levothyroxine  125 mcg Per Tube QAC breakfast   metoCLOPramide (REGLAN) injection  10 mg Intravenous Q8H   sennosides  10 mL Per Tube BID   sodium chloride flush  10-40 mL Intracatheter Q12H   Continuous Infusions:  sodium chloride     famotidine  (PEPCID) IV (ONCOLOGY)        LOS: 9 days   Charlynne Cousins  [facility]  05/15/2022, 8:13 AM

## 2022-05-15 NOTE — Progress Notes (Signed)
       Subjective: Feeds running this morning at 30. Minimal oral intake secondary to nausea. No vomiting. Having bowel function.   Objective: Vital signs in last 24 hours: Temp:  [97.4 F (36.3 C)-99.4 F (37.4 C)] 99.4 F (37.4 C) (06/03 0607) Pulse Rate:  [109-115] 109 (06/03 0607) Resp:  [16-18] 16 (06/03 0607) BP: (105-113)/(73-79) 105/76 (06/03 0607) SpO2:  [95 %-99 %] 99 % (06/03 0607) Weight:  [93.1 kg] 93.1 kg (06/03 0653) Last BM Date : 05/14/22  Intake/Output from previous day: 06/02 0701 - 06/03 0700 In: 1417.3 [NG/GT:1417.3] Out: 850 [Urine:850] Intake/Output this shift: No intake/output data recorded.  PE: General: resting in bed, appears ill Neuro: alert and oriented, no focal deficits Resp: normal work of breathing on room air Abdomen: soft, minimally distended, nontender to palpation. J tube in place. Extremities: warm and well-perfused   Lab Results:  Recent Labs    05/14/22 0506  WBC 10.0  HGB 7.8*  HCT 24.8*  PLT 197   BMET No results for input(s): NA, K, CL, CO2, GLUCOSE, BUN, CREATININE, CALCIUM in the last 72 hours.  PT/INR No results for input(s): LABPROT, INR in the last 72 hours. CMP     Component Value Date/Time   NA 141 05/12/2022 0500   NA 136 08/21/2021 1015   K 3.7 05/12/2022 0500   CL 108 05/12/2022 0500   CO2 27 05/12/2022 0500   GLUCOSE 107 (H) 05/12/2022 0500   BUN 43 (H) 05/12/2022 0500   BUN 23 08/21/2021 1015   CREATININE 0.81 05/12/2022 0500   CREATININE 0.89 04/28/2022 1434   CALCIUM 8.8 (L) 05/12/2022 0500   PROT 5.9 (L) 05/12/2022 0500   ALBUMIN 2.2 (L) 05/12/2022 0500   AST 369 (H) 05/12/2022 0500   AST 491 (HH) 04/14/2022 1434   ALT 237 (H) 05/12/2022 0500   ALT 151 (H) 05/02/2022 1434   ALKPHOS 658 (H) 05/12/2022 0500   BILITOT 3.1 (H) 05/12/2022 0500   BILITOT 2.6 (H) 04/15/2022 1434   GFRNONAA >60 05/12/2022 0500   GFRNONAA >60 04/23/2022 1434   Lipase     Component Value Date/Time   LIPASE 57  (H) 05/07/2022 1838       Studies/Results: No results found.    Assessment/Plan  66 yo male with gastric adenocarcinoma, s/p total gastrectomy with roux-en-Y esophagojejunostomy and feeding J tube on 02/11/22. Now admitted with nausea/vomiting and found to have metastatic disease to the liver. Patient remains very deconditioned with malnutrition. Tolerating feeds at 30 ml/hr this morning but endorses some bloating. Continue slow advancement, ok for full liquids for comfort. Nutrition has been an ongoing issue. If patient is not able to tolerate feeds, will need to re-evaluate goals of care. I emphasized this morning the importance of nutrition if he is to continue with chemotherapy. He is deconditioned but would like to go home after discharge instead of a SNF or rehab. Surgery will continue to follow while inpatient.    LOS: 9 days    Michaelle Birks, MD [facility] General, Hepatobiliary and Pancreatic Surgery 05/15/22 9:19 AM

## 2022-05-15 NOTE — Progress Notes (Signed)
Patient vomited once, attending physician made aware.

## 2022-05-16 ENCOUNTER — Inpatient Hospital Stay (HOSPITAL_COMMUNITY): Payer: Medicare PPO

## 2022-05-16 ENCOUNTER — Other Ambulatory Visit: Payer: Self-pay | Admitting: Oncology

## 2022-05-16 DIAGNOSIS — R112 Nausea with vomiting, unspecified: Secondary | ICD-10-CM | POA: Diagnosis not present

## 2022-05-16 DIAGNOSIS — C160 Malignant neoplasm of cardia: Secondary | ICD-10-CM | POA: Diagnosis not present

## 2022-05-16 LAB — GLUCOSE, CAPILLARY
Glucose-Capillary: 221 mg/dL — ABNORMAL HIGH (ref 70–99)
Glucose-Capillary: 209 mg/dL — ABNORMAL HIGH (ref 70–99)
Glucose-Capillary: 157 mg/dL — ABNORMAL HIGH (ref 70–99)
Glucose-Capillary: 207 mg/dL — ABNORMAL HIGH (ref 70–99)
Glucose-Capillary: 224 mg/dL — ABNORMAL HIGH (ref 70–99)

## 2022-05-16 NOTE — Progress Notes (Addendum)
Entered patient's room due to feeding pump beeping. Patient was on the floor on the left side of the bed. He states he was trying to go to the bathroom. He states he fell on his bottom. He is reporting chest pain 4/10, unable to provide pain descriptors.. Attending doctor notified. Requested for GI to assess J-Tube placement, looks as if stitching was dislodged.Patient placed back in bed, washed up, and bed alarm activated.

## 2022-05-16 NOTE — Progress Notes (Signed)
Patient now c/o lower back pain 3/10. Daughter is at bedside. Made attending MD aware. No imaging ordered.

## 2022-05-16 NOTE — Progress Notes (Signed)
Wife, Lossie Faes notified by phone of fall.

## 2022-05-16 NOTE — Progress Notes (Signed)
Subjective: Feeds are at 55 this morning, but patient feels more distended on exam. Per bedside RN patient vomited yesterday. He is more lethargic this morning.   Objective: Vital signs in last 24 hours: Temp:  [97.3 F (36.3 C)-98 F (36.7 C)] 97.7 F (36.5 C) (06/04 0528) Pulse Rate:  [108-116] 112 (06/04 0528) Resp:  [20] 20 (06/04 0528) BP: (106-112)/(70-77) 112/77 (06/04 0528) SpO2:  [97 %-99 %] 99 % (06/04 0528) Last BM Date : 05/14/22  Intake/Output from previous day: 06/03 0701 - 06/04 0700 In: 120 [P.O.:120] Out: 150 [Urine:150] Intake/Output this shift: No intake/output data recorded.  PE: General: resting in bed, appears ill Neuro: alert and oriented, no focal deficits Resp: normal work of breathing on room air Abdomen: soft, mildly distended. J tube in place with feeds running. Extremities: warm and well-perfused   Lab Results:  Recent Labs    05/14/22 0506  WBC 10.0  HGB 7.8*  HCT 24.8*  PLT 197   BMET No results for input(s): NA, K, CL, CO2, GLUCOSE, BUN, CREATININE, CALCIUM in the last 72 hours.  PT/INR No results for input(s): LABPROT, INR in the last 72 hours. CMP     Component Value Date/Time   NA 141 05/12/2022 0500   NA 136 08/21/2021 1015   K 3.7 05/12/2022 0500   CL 108 05/12/2022 0500   CO2 27 05/12/2022 0500   GLUCOSE 107 (H) 05/12/2022 0500   BUN 43 (H) 05/12/2022 0500   BUN 23 08/21/2021 1015   CREATININE 0.81 05/12/2022 0500   CREATININE 0.89 04/12/2022 1434   CALCIUM 8.8 (L) 05/12/2022 0500   PROT 5.9 (L) 05/12/2022 0500   ALBUMIN 2.2 (L) 05/12/2022 0500   AST 369 (H) 05/12/2022 0500   AST 491 (HH) 04/26/2022 1434   ALT 237 (H) 05/12/2022 0500   ALT 151 (H) 05/11/2022 1434   ALKPHOS 658 (H) 05/12/2022 0500   BILITOT 3.1 (H) 05/12/2022 0500   BILITOT 2.6 (H) 04/28/2022 1434   GFRNONAA >60 05/12/2022 0500   GFRNONAA >60 05/06/2022 1434   Lipase     Component Value Date/Time   LIPASE 57 (H) 05/07/2022 1838        Studies/Results: DG Abd 1 View  Result Date: 05/16/2022 CLINICAL DATA:  Abdominal distension with tube feeds EXAM: ABDOMEN - 1 VIEW COMPARISON:  CT abdomen/pelvis 04/28/2022 FINDINGS: Jejunostomy tube present with the tip overlying the left mid abdomen. Large soft tissue density occupies the majority of the right abdomen and displaces the bowel gas pattern inferiorly and toward the left. Correlation with prior CT imaging demonstrates marked hepatomegaly with extensive hepatic metastatic disease. The shadow on today's abdominal radiograph likely represents the enlarged liver with a small amount of perihepatic ascites. No evidence of bowel obstruction. IMPRESSION: Abdominal distension likely due to hepatomegaly with diffuse metastatic disease. No evidence of bowel obstruction. Electronically Signed   By: Jacqulynn Cadet M.D.   On: 05/16/2022 08:35      Assessment/Plan  66 yo male with gastric adenocarcinoma, s/p total gastrectomy with roux-en-Y esophagojejunostomy and feeding J tube on 02/11/22. Now admitted with nausea/vomiting and found to have metastatic disease to the liver. Feeds are at goal but he is more distended today. KUB does not show any small bowel distension. Suspect distension is due to ascites and metastatic disease to liver. Patient is less interactive today. Continue feeds if able to tolerate, may need to decrease rate. Patient's wife was not at bedside at time of  my rounds this morning, but we may need to consider goals of care discussion this week as patient remains very deconditioned.    LOS: 10 days    Michaelle Birks, MD [facility] Surgery General, Hepatobiliary and Pancreatic Surgery 05/16/22 9:01 AM

## 2022-05-16 NOTE — Progress Notes (Signed)
Patient assessed by attending MD at bedside. Feed tube continued.

## 2022-05-16 NOTE — Progress Notes (Signed)
[facility] PROGRESS NOTE    Progress Note  Zachary Bowers  OZH:[id number] DOB: [birth year] DOA: 04/13/2022 PCP: Shirline Frees, MD     Brief Narrative:   Zachary Bowers is an 66 y.o. male past medical history significant for CAD status post stents, essential hypertension, diabetes mellitus type 2, recently diagnosed with gastric cancer status post gastric resection and feeding tube on chemotherapy due to node positive.  Presents with 1 month history of nausea and vomiting and abdominal pain which is intractable CT raise concern for hepatic metastases.  Oncology and interventional radiology has been consulted.    Assessment/Plan:   Intractable  Nausea & vomiting in the setting of  Gastric cancer [facility]) CT scan of the abdomen pelvis was negative for small bowel obstruction. Suggest to be tubings placed. Nausea and vomiting likely due to metastatic disease seen on imaging and his constipation. Having regular bowel movements. Now tolerating 40 mL an hour. General surgery evaluated the patient recommended a KUB  Gastric cancer with metastatic hepatic disease new lesion:  Oncology on board, giving consideration for adjuvant immunotherapy, initially he was thought not to be a good candidate due to physical deconditioning and weight loss. Patient was started on chemotherapy FOLFOX/Herceptin on 05/07/2022. PICC line in place. Physical therapy evaluated the patient recommended skilled, but the family would like to take him home with home health.  Acute kidney injury: Likely prerenal azotemia. As he was not at goal with his tube feedings IV fluids were continued ACE inhibitor's on hold.  Status post jejunostomy tube/nutrition: CT showed no obstructions, jejunostomy tube in place patient having difficulty tolerating his feedings. Has been tolerating tube feedings. He is positive about 7 L. Tolerating his tube feeds for over 16 hours increase to 10 mL every 24  hours.  Transaminitis Likely due to liver metastases. No biliary dilation seen on CT. LFTs continue to be elevated.  Concern for infection: CT of abdomen and pelvis showed no source. Procalcitonin was noted to be elevated in the setting of cancer. He remained afebrile and leukocytosis. No obvious source of infection, Zosyn was discontinued 05/08/2022.  Diabetes mellitus type 2: With an A1c of 6.6 continue sliding scale insulin.  Normocytic anemia: Likely due to malignancy.  Essential hypertension/hyponatremia: Continue amlodipine, holding Avapro. Continue to monitor blood pressure.  Hypothyroidism: Continue Synthroid.  Hyperlipidemia: Continue statins.  Severe physical deconditioning: Physical therapy evaluated the patient, family would like to take him home instead of skilled nursing facility.   Nutrition: Estimated body mass index is 27.84 kg/m as calculated from the following:   Height as of this encounter: 6' (1.829 m).   Weight as of this encounter: 93.1 kg. Malnutrition Type:  Nutrition Problem: Severe Malnutrition Etiology: chronic illness, cancer and cancer related treatments   Malnutrition Characteristics:  Signs/Symptoms: energy intake < or equal to 75% for > or equal to 1 month, percent weight loss, moderate fat depletion, mild fat depletion, moderate muscle depletion   Nutrition Interventions:  Interventions: Tube feeding, Prostat, MVI    DVT prophylaxis: lovenox Family Communication:wife Status is: Inpatient Remains inpatient appropriate because: Until we can increase his feedings.    Code Status:     Code Status Orders  (From admission, onward)           Start     Ordered   04/19/2022 2246  Full code  Continuous        04/16/2022 2245           Code Status History  Date Active Date Inactive Code Status Order ID Comments User Context   02/11/2022 1532 02/20/2022 1855 Full Code [id number]  [name], MD Inpatient    10/08/2021 0736 10/11/2021 1557 Full Code [id number]  Neena Rhymes, MD ED   08/11/2021 1007 08/11/2021 2014 Full Code [id number]  Jettie Booze, MD Inpatient         IV Access:   Peripheral IV   Procedures and diagnostic studies:   No results found.   Medical Consultants:   None.   Subjective:    Zachary Bowers complaining of abdominal bloating.  Objective:    Vitals:   05/15/22 0653 05/15/22 1310 05/15/22 2057 05/16/22 0528  BP:  106/70 110/71 112/77  Pulse:  (!) 108 (!) 116 (!) 112  Resp:  '20 20 20  '$ Temp:  (!) 97.3 F (36.3 C) 98 F (36.7 C) 97.7 F (36.5 C)  TempSrc:  Oral Oral Oral  SpO2:  97% 99% 99%  Weight: 93.1 kg     Height:       SpO2: 99 %   Intake/Output Summary (Last 24 hours) at 05/16/2022 0806 Last data filed at 05/16/2022 0529 Gross per 24 hour  Intake 120 ml  Output 150 ml  Net -30 ml    Filed Weights   05/13/22 0420 05/14/22 0710 05/15/22 0653  Weight: 91.9 kg 92.8 kg 93.1 kg    Exam: General exam: In no acute distress. Respiratory system: Good air movement and clear to auscultation. Cardiovascular system: S1 & S2 heard, RRR. No JVD. Gastrointestinal system: Abdomen is nondistended, soft and nontender.  Extremities: No pedal edema. Skin: No rashes, lesions or ulcers Psychiatry: Judgement and insight appear normal. Mood & affect appropriate.   Data Reviewed:    Labs: Basic Metabolic Panel: Recent Labs  Lab 05/10/22 0436 05/11/22 0616 05/12/22 0500  NA 138 140 141  K 3.8 3.6 3.7  CL 105 106 108  CO2 '24 26 27  '$ GLUCOSE 169* 122* 107*  BUN 60* 56* 43*  CREATININE 1.23 0.96 0.81  CALCIUM 8.6* 8.7* 8.8*    GFR Estimated Creatinine Clearance: 98.5 mL/min (by C-G formula based on SCr of 0.81 mg/dL). Liver Function Tests: Recent Labs  Lab 05/10/22 0436 05/11/22 0616 05/12/22 0500  AST 542* 407* 369*  ALT 245* 238* 237*  ALKPHOS 733* 664* 658*  BILITOT 1.9* 2.4* 3.1*  PROT 6.3* 6.1* 5.9*  ALBUMIN 2.2*  2.2* 2.2*    No results for input(s): LIPASE, AMYLASE in the last 168 hours.  No results for input(s): AMMONIA in the last 168 hours.  Coagulation profile No results for input(s): INR, PROTIME in the last 168 hours.  COVID-19 Labs  No results for input(s): DDIMER, FERRITIN, LDH, CRP in the last 72 hours.  Lab Results  Component Value Date   SARSCOV2NAA NEGATIVE 02/10/2022   [place] NEGATIVE 10/08/2021    CBC: Recent Labs  Lab 05/10/22 0436 05/11/22 0616 05/14/22 0506  WBC 7.8 8.3 10.0  NEUTROABS  --   --  8.9*  HGB 8.6* 8.0* 7.8*  HCT 26.1* 25.2* 24.8*  MCV 80.1 80.5 80.5  PLT 285 258 197    Cardiac Enzymes: No results for input(s): CKTOTAL, CKMB, CKMBINDEX, TROPONINI in the last 168 hours. BNP (last 3 results) No results for input(s): PROBNP in the last 8760 hours. CBG: Recent Labs  Lab 05/15/22 1203 05/15/22 1519 05/15/22 2128 05/16/22 0352 05/16/22 0743  GLUCAP 178* 171* 204* 221* 209*    D-Dimer: No results for input(s):  DDIMER in the last 72 hours. Hgb A1c: No results for input(s): HGBA1C in the last 72 hours. Lipid Profile: No results for input(s): CHOL, HDL, LDLCALC, TRIG, CHOLHDL, LDLDIRECT in the last 72 hours. Thyroid function studies: No results for input(s): TSH, T4TOTAL, T3FREE, THYROIDAB in the last 72 hours.  Invalid input(s): FREET3 Anemia work up: No results for input(s): VITAMINB12, FOLATE, FERRITIN, TIBC, IRON, RETICCTPCT in the last 72 hours. Sepsis Labs: Recent Labs  Lab 05/10/22 0436 05/11/22 0616 05/14/22 0506  WBC 7.8 8.3 10.0    Microbiology No results found for this or any previous visit (from the past 240 hour(s)).    Medications:    amLODipine  10 mg Per Tube Daily   aspirin  81 mg Per Tube Daily   Chlorhexidine Gluconate Cloth  6 each Topical Daily   enoxaparin (LOVENOX) injection  40 mg Subcutaneous Q24H   feeding supplement (PROSource TF)  90 mL Per Tube TID   feeding supplement (VITAL 1.5 CAL)  1,000  mL Per Tube Q24H   free water  100 mL Per Tube Q6H   insulin aspart  0-15 Units Subcutaneous Q4H   insulin glargine-yfgn  5 Units Subcutaneous QHS   levothyroxine  125 mcg Per Tube QAC breakfast   metoCLOPramide (REGLAN) injection  10 mg Intravenous Q8H   sennosides  10 mL Per Tube BID   sodium chloride flush  10-40 mL Intracatheter Q12H   Continuous Infusions:  sodium chloride        LOS: 10 days   Charlynne Cousins  [facility]  05/16/2022, 8:06 AM

## 2022-05-16 NOTE — Progress Notes (Signed)
Occupational Therapy Treatment Patient Details Name: Zachary Bowers MRN: [id number] DOB: 1956/07/16 Today's Date: 05/16/2022   History of present illness Zachary Bowers is an 66 y.o. male past medical history significant for CAD status post stents, essential hypertension, diabetes mellitus type 2, recently diagnosed with gastric cancer status post gastric resection and feeding tube on chemotherapy due to node positive.  Presents with 1 month history of nausea and vomiting and abdominal pain which is intractable CT raise concern for hepatic metastases.  Oncology and interventional radiology has been consulted.   OT comments  Patient is supine in bed. He exhibits a flat affect and minimal responses to therapist. Rn reports patient fell this morning and found on left side of bed. Patient is max assist to roll left and right, total assist for perianal care and to don socks. He is now using his right arm to raise his left with left shoulder not even partially raising. His wife reports he started raising it yesterday. He is generally weak in all extremities. He needs active assist to raise knees in the bed, he can't hold them together and he is only able to raise his legs approximately 1 inch off of the bed. He is alert to self and "[facility] hospital." When asked the month he states "May." When asked the year he states "24" and then perseverates on "May 24:" When asked who was in the room he initially says he doesn't know but then says "Tasha" and perseverates on her name. Due to increased weakness and patient now needing significant assistance recommend short term rehab at discharge. Family are in agreement.   Recommendations for follow up therapy are one component of a multi-disciplinary discharge planning process, led by the attending physician.  Recommendations may be updated based on patient status, additional functional criteria and insurance authorization.    Follow Up Recommendations  Skilled  nursing-short term rehab (<3 hours/day)    Assistance Recommended at Discharge Frequent or constant Supervision/Assistance  Patient can return home with the following  Two people to help with walking and/or transfers;A lot of help with bathing/dressing/bathroom;Assistance with cooking/housework;Direct supervision/assist for financial management;Help with stairs or ramp for entrance;Assist for transportation;Direct supervision/assist for medications management   Equipment Recommendations  BSC/3in1    Recommendations for Other Services      Precautions / Restrictions Precautions Precautions: Fall Precaution Comments: N/V, chemo precautions, fell 6/4 Restrictions Weight Bearing Restrictions: No       Mobility Bed Mobility Overal bed mobility: Needs Assistance Bed Mobility: Rolling Rolling: Max assist              Transfers                         Balance                                           ADL either performed or assessed with clinical judgement   ADL Overall ADL's : Needs assistance/impaired                     Lower Body Dressing: Total assistance;Bed level Lower Body Dressing Details (indicate cue type and reason): to don socks     Toileting- Clothing Manipulation and Hygiene: Total assistance;Bed level Toileting - Clothing Manipulation Details (indicate cue type and reason): for perianal care  Extremity/Trunk Assessment Upper Extremity Assessment Upper Extremity Assessment: RUE deficits/detail;LUE deficits/detail RUE Deficits / Details: WFL ROM, grossly 4-/5 strength in elbow, grip 3/5, grosslly functional coordination RUE Sensation: WNL LUE Deficits / Details: Unable to lift right shoulder today (2+/5), has been using left arm to lift it, otherwise functional ROM. and 4-/5 strength in elbow, grip 3/5; grossly functional coordination LUE Sensation: WNL   Lower Extremity Assessment Lower Extremity  Assessment: Defer to PT evaluation        Vision   Vision Assessment?: No apparent visual deficits   Perception     Praxis      Cognition Arousal/Alertness: Awake/alert Behavior During Therapy: Flat affect Overall Cognitive Status: Impaired/Different from baseline                                 General Comments: Alert to self and hospital. Answered incorrect month and year and then perseverated on stating May 24. When asked who was in the room he perseverated on daughter's name.        Exercises      Shoulder Instructions       General Comments      Pertinent Vitals/ Pain       Pain Assessment Pain Assessment: Faces Faces Pain Scale: Hurts little more Pain Location: ABD Pain Descriptors / Indicators: Grimacing Pain Intervention(s): Monitored during session  Home Living                                          Prior Functioning/Environment              Frequency  Min 2X/week        Progress Toward Goals  OT Goals(current goals can now be found in the care plan section)  Progress towards OT goals: Not progressing toward goals - comment (increased weakness,)  Acute Rehab OT Goals Patient Stated Goal: to get stronger OT Goal Formulation: With family Time For Goal Achievement: May 29, 2022 Potential to Achieve Goals: [place] Discharge plan needs to be updated    Co-evaluation                 AM-PAC OT "6 Clicks" Daily Activity     Outcome Measure   Help from another person eating meals?: A Little Help from another person taking care of personal grooming?: A Little Help from another person toileting, which includes using toliet, bedpan, or urinal?: Total Help from another person bathing (including washing, rinsing, drying)?: A Lot Help from another person to put on and taking off regular upper body clothing?: A Lot Help from another person to put on and taking off regular lower body clothing?: Total 6  Click Score: 12    End of Session    OT Visit Diagnosis: Unsteadiness on feet (R26.81);Other abnormalities of gait and mobility (R26.89);Muscle weakness (generalized) (M62.81)   Activity Tolerance Patient limited by fatigue;Patient limited by pain   Patient Left in bed;with call bell/phone within reach;with family/visitor present   Nurse Communication Mobility status        Time: 0037-0488 OT Time Calculation (min): 26 min  Charges: OT General Charges $OT Visit: 1 Visit OT Treatments $Self Care/Home Management : 8-22 mins $Therapeutic Activity: 8-22 mins  Makenlee Mckeag, OTR/L Acute Care Rehab Services  Office [phone] Pager: [place] 05/16/2022, 3:46  PM

## 2022-05-17 ENCOUNTER — Inpatient Hospital Stay (HOSPITAL_COMMUNITY): Payer: Medicare PPO

## 2022-05-17 ENCOUNTER — Encounter (HOSPITAL_COMMUNITY): Payer: Self-pay | Admitting: Internal Medicine

## 2022-05-17 DIAGNOSIS — E119 Type 2 diabetes mellitus without complications: Secondary | ICD-10-CM | POA: Diagnosis not present

## 2022-05-17 DIAGNOSIS — I10 Essential (primary) hypertension: Secondary | ICD-10-CM | POA: Diagnosis not present

## 2022-05-17 DIAGNOSIS — R1084 Generalized abdominal pain: Secondary | ICD-10-CM | POA: Diagnosis not present

## 2022-05-17 DIAGNOSIS — R112 Nausea with vomiting, unspecified: Secondary | ICD-10-CM | POA: Diagnosis not present

## 2022-05-17 DIAGNOSIS — C160 Malignant neoplasm of cardia: Secondary | ICD-10-CM | POA: Diagnosis not present

## 2022-05-17 LAB — CBC WITH DIFFERENTIAL/PLATELET
Abs Immature Granulocytes: 0.04 10*3/uL (ref 0.00–0.07)
Basophils Absolute: 0 10*3/uL (ref 0.0–0.1)
Basophils Relative: 0 %
Eosinophils Absolute: 0 10*3/uL (ref 0.0–0.5)
Eosinophils Relative: 0 %
HCT: 26.4 % — ABNORMAL LOW (ref 39.0–52.0)
Hemoglobin: 8.5 g/dL — ABNORMAL LOW (ref 13.0–17.0)
Immature Granulocytes: 1 %
Lymphocytes Relative: 8 %
Lymphs Abs: 0.6 10*3/uL — ABNORMAL LOW (ref 0.7–4.0)
MCH: 25.9 pg — ABNORMAL LOW (ref 26.0–34.0)
MCHC: 32.2 g/dL (ref 30.0–36.0)
MCV: 80.5 fL (ref 80.0–100.0)
Monocytes Absolute: 0.4 10*3/uL (ref 0.1–1.0)
Monocytes Relative: 5 %
Neutro Abs: 7.1 10*3/uL (ref 1.7–7.7)
Neutrophils Relative %: 86 %
Platelets: 206 10*3/uL (ref 150–400)
RBC: 3.28 MIL/uL — ABNORMAL LOW (ref 4.22–5.81)
RDW: 19.8 % — ABNORMAL HIGH (ref 11.5–15.5)
WBC: 8.1 10*3/uL (ref 4.0–10.5)
nRBC: 0.5 % — ABNORMAL HIGH (ref 0.0–0.2)

## 2022-05-17 LAB — COMPREHENSIVE METABOLIC PANEL
ALT: 156 U/L — ABNORMAL HIGH (ref 0–44)
AST: 234 U/L — ABNORMAL HIGH (ref 15–41)
Albumin: 2.3 g/dL — ABNORMAL LOW (ref 3.5–5.0)
Alkaline Phosphatase: 606 U/L — ABNORMAL HIGH (ref 38–126)
Anion gap: 9 (ref 5–15)
BUN: 48 mg/dL — ABNORMAL HIGH (ref 8–23)
CO2: 25 mmol/L (ref 22–32)
Calcium: 9.2 mg/dL (ref 8.9–10.3)
Chloride: 111 mmol/L (ref 98–111)
Creatinine, Ser: 0.78 mg/dL (ref 0.61–1.24)
GFR, Estimated: >60 mL/min (ref >60)
Glucose, Bld: 174 mg/dL — ABNORMAL HIGH (ref 70–99)
Potassium: 3.8 mmol/L (ref 3.5–5.1)
Sodium: 145 mmol/L (ref 135–145)
Total Bilirubin: 2 mg/dL — ABNORMAL HIGH (ref 0.3–1.2)
Total Protein: 6.1 g/dL — ABNORMAL LOW (ref 6.5–8.1)

## 2022-05-17 LAB — GLUCOSE, CAPILLARY
Glucose-Capillary: 143 mg/dL — ABNORMAL HIGH (ref 70–99)
Glucose-Capillary: 192 mg/dL — ABNORMAL HIGH (ref 70–99)
Glucose-Capillary: 198 mg/dL — ABNORMAL HIGH (ref 70–99)
Glucose-Capillary: 213 mg/dL — ABNORMAL HIGH (ref 70–99)
Glucose-Capillary: 243 mg/dL — ABNORMAL HIGH (ref 70–99)
Glucose-Capillary: 247 mg/dL — ABNORMAL HIGH (ref 70–99)

## 2022-05-17 LAB — AMMONIA: Ammonia: 61 umol/L — ABNORMAL HIGH (ref 9–35)

## 2022-05-17 MED ORDER — LACTULOSE 10 GM/15ML PO SOLN
20.0000 g | Freq: Three times a day (TID) | ORAL | Status: DC
  Administered 2022-05-17 – 2022-05-19 (×6): 20 g via ORAL
  Filled 2022-05-17 (×6): qty 30

## 2022-05-17 NOTE — Progress Notes (Signed)
[facility] PROGRESS NOTE    Progress Note  Zachary Bowers  DPO:[id number] DOB: October 08, 1956 DOA: 05/07/2022 PCP: Shirline Frees, MD     Brief Narrative:   Zachary Bowers is an 66 y.o. male past medical history significant for CAD status post stents, essential hypertension, diabetes mellitus type 2, recently diagnosed with gastric cancer status post gastric resection and feeding tube on chemotherapy due to node positive.  Presents with 1 month history of nausea and vomiting and abdominal pain which is intractable CT raise concern for hepatic metastases.  Oncology and interventional radiology has been consulted.    Assessment/Plan:   Intractable  Nausea & vomiting in the setting of  Gastric cancer [facility]) CT scan of the abdomen pelvis was negative for small bowel obstruction. Suggest to be tubings placed. Nausea and vomiting likely due to metastatic disease seen on imaging and his constipation. Now resolved having regular bowel movements. His tube feedings are going at 50 mils an hour. KUB showed no small bowel obstruction.  Gastric cancer with metastatic hepatic disease new lesion:  Oncology on board, giving consideration for adjuvant immunotherapy, initially he was thought not to be a good candidate due to physical deconditioning and weight loss. Patient was started on chemotherapy FOLFOX/Herceptin on 05/07/2022. Oncology please do not tolerate chemotherapy at this time will have to be reevaluated as an outpatient. PICC line in place. Physical therapy evaluated the patient recommended skilled, but the family would like to take him home with home health.  Acute kidney injury: Likely prerenal azotemia. As he was not at goal with his tube feedings IV fluids were continued ACE inhibitor's on hold.  Status post jejunostomy tube/nutrition: CT showed no obstructions, jejunostomy tube in place patient having difficulty tolerating his feedings. Has been tolerating tube feedings. He  is positive about 7 L. Tolerating his tube feeds for over 16 hours increase to 10 mL every 24 hours.  Transaminitis Likely due to liver metastases. No biliary dilation seen on CT. LFTs continue to be elevated.  Acute confusional state versus acute metabolic encephalopathy: Check a CMP and ammonia level. The patient is slightly confused this morning.  Diabetes mellitus type 2: With an A1c of 6.6 continue sliding scale insulin.  Normocytic anemia: Likely due to malignancy.  Essential hypertension/hyponatremia: Continue amlodipine, holding Avapro. Continue to monitor blood pressure.  Hypothyroidism: Continue Synthroid.  Hyperlipidemia: Continue statins.  Severe physical deconditioning: Physical therapy evaluated the patient, family would like to take him home instead of skilled nursing facility.   Nutrition: Estimated body mass index is 27.42 kg/m as calculated from the following:   Height as of this encounter: 6' (1.829 m).   Weight as of this encounter: 91.7 kg. Malnutrition Type:  Nutrition Problem: Severe Malnutrition Etiology: chronic illness, cancer and cancer related treatments   Malnutrition Characteristics:  Signs/Symptoms: energy intake < or equal to 75% for > or equal to 1 month, percent weight loss, moderate fat depletion, mild fat depletion, moderate muscle depletion   Nutrition Interventions:  Interventions: Tube feeding, Prostat, MVI    DVT prophylaxis: lovenox Family Communication:wife Status is: Inpatient Remains inpatient appropriate because: Until we can increase his feedings.    Code Status:     Code Status Orders  (From admission, onward)           Start     Ordered   05/07/2022 2246  Full code  Continuous        04/20/2022 2245           Code  Status History     Date Active Date Inactive Code Status Order ID Comments User Context   02/11/2022 1532 02/20/2022 1855 Full Code [id number]  [name], MD Inpatient    10/08/2021 0736 10/11/2021 1557 Full Code [id number]  Neena Rhymes, MD ED   08/11/2021 1007 08/11/2021 2014 Full Code [id number]  Jettie Booze, MD Inpatient         IV Access:   Peripheral IV   Procedures and diagnostic studies:   DG Abd 1 View  Result Date: 05/16/2022 CLINICAL DATA:  Abdominal distension with tube feeds EXAM: ABDOMEN - 1 VIEW COMPARISON:  CT abdomen/pelvis 05/07/2022 FINDINGS: Jejunostomy tube present with the tip overlying the left mid abdomen. Large soft tissue density occupies the majority of the right abdomen and displaces the bowel gas pattern inferiorly and toward the left. Correlation with prior CT imaging demonstrates marked hepatomegaly with extensive hepatic metastatic disease. The shadow on today's abdominal radiograph likely represents the enlarged liver with a small amount of perihepatic ascites. No evidence of bowel obstruction. IMPRESSION: Abdominal distension likely due to hepatomegaly with diffuse metastatic disease. No evidence of bowel obstruction. Electronically Signed   By: Jacqulynn Cadet M.D.   On: 05/16/2022 08:35     Medical Consultants:   None.   Subjective:    Zachary Bowers confused this morning.  Objective:    Vitals:   05/17/22 0005 05/17/22 0342 05/17/22 0632 05/17/22 0713  BP: 117/78 (!) 96/59 102/77   Pulse: (!) 119 (!) 111 (!) 114   Resp: '20 18 16   '$ Temp: 97.6 F (36.4 C) (!) 97.4 F (36.3 C) (!) 97.4 F (36.3 C)   TempSrc: Oral Oral Oral   SpO2: 96% 98% 100%   Weight:    91.7 kg  Height:       SpO2: 100 %   Intake/Output Summary (Last 24 hours) at 05/17/2022 0719 Last data filed at 05/17/2022 0700 Gross per 24 hour  Intake 240 ml  Output 600 ml  Net -360 ml    Filed Weights   05/14/22 0710 05/15/22 0653 05/17/22 0713  Weight: 92.8 kg 93.1 kg 91.7 kg    Exam: General exam: In no acute distress. Respiratory system: Good air movement and clear to auscultation. Cardiovascular system: S1 & S2  heard, RRR. No JVD. Gastrointestinal system: Abdomen is nondistended, soft and nontender.  Extremities: No pedal edema. Skin: No rashes, lesions or ulcers  Data Reviewed:    Labs: Basic Metabolic Panel: Recent Labs  Lab 05/11/22 0616 05/12/22 0500  NA 140 141  K 3.6 3.7  CL 106 108  CO2 26 27  GLUCOSE 122* 107*  BUN 56* 43*  CREATININE 0.96 0.81  CALCIUM 8.7* 8.8*    GFR Estimated Creatinine Clearance: 98.5 mL/min (by C-G formula based on SCr of 0.81 mg/dL). Liver Function Tests: Recent Labs  Lab 05/11/22 0616 05/12/22 0500  AST 407* 369*  ALT 238* 237*  ALKPHOS 664* 658*  BILITOT 2.4* 3.1*  PROT 6.1* 5.9*  ALBUMIN 2.2* 2.2*    No results for input(s): LIPASE, AMYLASE in the last 168 hours.  No results for input(s): AMMONIA in the last 168 hours.  Coagulation profile No results for input(s): INR, PROTIME in the last 168 hours.  COVID-19 Labs  No results for input(s): DDIMER, FERRITIN, LDH, CRP in the last 72 hours.  Lab Results  Component Value Date   SARSCOV2NAA NEGATIVE 02/10/2022   [place] NEGATIVE 10/08/2021    CBC: Recent Labs  Lab 05/11/22 0616 05/14/22 0506  WBC 8.3 10.0  NEUTROABS  --  8.9*  HGB 8.0* 7.8*  HCT 25.2* 24.8*  MCV 80.5 80.5  PLT 258 197    Cardiac Enzymes: No results for input(s): CKTOTAL, CKMB, CKMBINDEX, TROPONINI in the last 168 hours. BNP (last 3 results) No results for input(s): PROBNP in the last 8760 hours. CBG: Recent Labs  Lab 05/16/22 1210 05/16/22 1643 05/16/22 2009 05/17/22 0007 05/17/22 0346  GLUCAP 157* 207* 224* 192* 213*    D-Dimer: No results for input(s): DDIMER in the last 72 hours. Hgb A1c: No results for input(s): HGBA1C in the last 72 hours. Lipid Profile: No results for input(s): CHOL, HDL, LDLCALC, TRIG, CHOLHDL, LDLDIRECT in the last 72 hours. Thyroid function studies: No results for input(s): TSH, T4TOTAL, T3FREE, THYROIDAB in the last 72 hours.  Invalid input(s):  FREET3 Anemia work up: No results for input(s): VITAMINB12, FOLATE, FERRITIN, TIBC, IRON, RETICCTPCT in the last 72 hours. Sepsis Labs: Recent Labs  Lab 05/11/22 0616 05/14/22 0506  WBC 8.3 10.0    Microbiology No results found for this or any previous visit (from the past 240 hour(s)).    Medications:    amLODipine  10 mg Per Tube Daily   aspirin  81 mg Per Tube Daily   Chlorhexidine Gluconate Cloth  6 each Topical Daily   enoxaparin (LOVENOX) injection  40 mg Subcutaneous Q24H   feeding supplement (PROSource TF)  90 mL Per Tube TID   feeding supplement (VITAL 1.5 CAL)  1,000 mL Per Tube Q24H   free water  100 mL Per Tube Q6H   insulin aspart  0-15 Units Subcutaneous Q4H   insulin glargine-yfgn  5 Units Subcutaneous QHS   levothyroxine  125 mcg Per Tube QAC breakfast   metoCLOPramide (REGLAN) injection  10 mg Intravenous Q8H   sennosides  10 mL Per Tube BID   sodium chloride flush  10-40 mL Intracatheter Q12H   Continuous Infusions:  sodium chloride        LOS: 11 days   Charlynne Cousins  [facility]  05/17/2022, 7:19 AM

## 2022-05-17 NOTE — Progress Notes (Signed)
PHYSICAL THERAPY  Pt out of room for Heat CT this morning.  I was unable to return late due to schedule.  Will attempt to see tomorrow.  Rica Koyanagi  PTA Acute  Rehabilitation Services Pager      [phone] Office      [phone]

## 2022-05-17 NOTE — Progress Notes (Addendum)
HEMATOLOGY-ONCOLOGY PROGRESS NOTE  ASSESSMENT AND PLAN: Gastric cancer CT abdomen/pelvis 10/08/2021-probable mass in the proximal stomach, enlarged gastropathic ligament node Upper endoscopy 10/01/2021-gastric cardia/fundus mass with oozing and stigmata of recent bleeding-biopsy at least intramucosal adenocarcinoma, mismatch repair protein expression intact, PD-L1 combined positive score 7%, HER2 positive PET 10/20/2021-hypermetabolic proximal gastric primary, or hypermetabolic gastropathic ligament node, right paramidline prostatic hypermetabolism, anal hypermetabolism without a CT mass Radiation 11/11/2021-12/22/2021 Cycle 1  Taxol/carboplatin 11/12/2021 Cycle 2 Taxol/carboplatin 11/19/2021 Cycle 3 Taxol/carboplatin 11/26/2021 Cycle 4 Taxol/carboplatin 12/03/2021 Cycle 5 Taxol/carboplatin 12/18/2021 PET 01/19/2022-decreased hypermetabolism at the gastric cardia, decreased size and metabolic activity associated with an enlarged gastrohepatic node, persistent anal hypermetabolism, stable lateral left lower lobe nodule, possible new more medial subpleural left lower lobe nodule-both nodules below PET resolution 02/11/2022-total gastrectomy with Roux-en-Y esophagojejunostomy, placement of jejunal feeding tube,ypT2,ypN1 moderate to poorly differentiated adenocarcinoma at the GE junction measuring 2.5 x 2.3 cm, lymphovascular invasion present, negative resection margins, 2/28 lymph nodes CT abdomen/pelvis with contrast 04/25/2022-innumerable hepatic metastases which are new, portacaval node suspicious for nodal metastasis. Cycle 1 FOLFOX/trastuzumab 05/07/2022   Microcytic anemia secondary to #1 Diabetes Hypertension Coronary artery disease, cardiac catheterization 08/11/2021-placement of proximal and distal right coronary artery stents Hypothyroidism Sleep apnea Anal hypermetabolism on the PET 10/20/2021-no mass identified Hospital admission 04/24/2022-nausea and vomiting, transaminitis.  Zachary Bowers is now  at day 11 following cycle 1 FOLFOX/trastuzumab.  He tolerated chemotherapy well.  Liver enzymes drawn this morning remain elevated but improved.  He has developed encephalopathy of unclear etiology.  Ammonia level has been obtained and results are still pending at this time.  He is tolerating his J-tube feeding better at this time.  Overall, his performance status has declined in the past few days.  If performance status is not improved, may need to shift focus to comfort measures/hospice.  If his performance status improves, we will administer a second cycle of chemotherapy as an outpatient on 05/24/2022.  Recommendations: 1.  Continue current pain medication. 2.  Diet as tolerated.  Continue J-tube feeding, increase to goal rate as tolerated 3.  Increase time out of bed 4.  Follow-up on ammonia level.  [name], NP   Mr. Poupard was interviewed and examined.  He appeared more lethargic and confused when I saw him early this morning.  He is tolerating the tube feedings at an increased rate.  I suspect the altered mental status is due to delirium from critical illness, progression of hepatic metastases, or dehydration.  I discussed the case with Dr. Olevia Bowens.  He is now at day 11 following cycle 1 FOLFOX/trastuzumab.  We will need to consider comfort care and hospice if his clinical condition does not improve over the next few days.  I was present for greater than 50% of today's visit.  I performed medical decision making. SUBJECTIVE: More confused this morning.  J-tube feeding running at 55 cc/h.  He is not having any nausea, vomiting, constipation today.  Wife and daughter at the bedside.  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[place])    10/28/2021 Cancer Staging   Staging form: Stomach, AJCC 8th Edition - Clinical: Stage Unknown (cTX, cN1, cM0) - Signed by Ladell Pier, MD on  10/28/2021    11/12/2021 - 12/18/2021 Chemotherapy   Patient is on Treatment Plan : ESOPHAGUS Carboplatin/PACLitaxel weekly x 6 weeks with XRT        05/07/2022 -  Chemotherapy   Patient is on Treatment Plan : GASTROESOPHAGEAL FOLFOX D1,15 + Trastuzumab (6/4) D1,15 + Pembrolizumab (200) every 3 weeks, q28d      Gastric cancer ([place])  02/11/2022 Initial Diagnosis   Gastric cancer ([place])    03/04/2022 Cancer Staging   Staging form: Stomach, AJCC 8th Edition - Pathologic: Stage IIA (pT2, pN1, cM0) - Signed by Ladell Pier, MD on 03/04/2022 Histologic grade (G): G2 Histologic grading system: 3 grade system Residual tumor (R): R0 - None    04/02/2022 -  Chemotherapy   Patient is on Treatment Plan : HEAD/NECK Nivolumab q14d        PHYSICAL EXAMINATION:  Vitals:   05/17/22 0632 05/17/22 0738  BP: 102/77 115/79  Pulse: (!) 114 (!) 113  Resp: 16 18  Temp: (!) 97.4 F (36.3 C) (!) 97.5 F (36.4 C)  SpO2: 100% 97%   Filed Weights   05/14/22 0710 05/15/22 0653 05/17/22 0713  Weight: 92.8 kg 93.1 kg 91.7 kg    Intake/Output from previous day: 06/04 0701 - 06/05 0700 In: 240 [P.O.:240] Out: 600 [Urine:600]  Physical exam: HEENT-no thrush Lungs-clear anteriorly Cardiac-regular rate and rhythm Abdomen-the liver is palpable in the right upper abdomen, left upper quadrant feeding tube Vascular-no leg edema  LABORATORY DATA:  I have reviewed the data as listed    Latest Ref Rng & Units 05/12/2022    5:00 AM 05/11/2022    6:16 AM 05/10/2022    4:36 AM  CMP  Glucose 70 - 99 mg/dL 107   122   169    BUN 8 - 23 mg/dL 43   56   60    Creatinine 0.61 - 1.24 mg/dL 0.81   0.96   1.23    Sodium 135 - 145 mmol/L 141   140   138    Potassium 3.5 - 5.1 mmol/L 3.7   3.6   3.8    Chloride 98 - 111 mmol/L 108   106   105    CO2 22 - 32 mmol/L _0 Calcium 8.9 - 10.3 mg/dL 8.8   8.7   8.6    Total Protein 6.5 - 8.1 g/dL 5.9   6.1   6.3    Total Bilirubin 0.3 - 1.2 mg/dL 3.1    2.4   1.9    Alkaline Phos 38 - 126 U/L 658   664   733    AST 15 - 41 U/L 369   407   542    ALT 0 - 44 U/L 237   238   245      Lab Results  Component Value Date   WBC 10.0 05/14/2022   HGB 7.8 (L) 05/14/2022   HCT 24.8 (L) 05/14/2022   MCV 80.5 05/14/2022   PLT 197 05/14/2022   NEUTROABS 8.9 (H) 05/14/2022    Lab Results  Component Value Date   CEA 1.50 10/30/2021   PSA1 2.7 10/30/2021    DG Abd 1 View  Result Date: 05/16/2022 CLINICAL DATA:  Abdominal distension with tube feeds EXAM: ABDOMEN - 1 VIEW COMPARISON:  CT abdomen/pelvis 05/09/2022 FINDINGS: Jejunostomy tube present with the tip overlying the left mid abdomen. Large soft tissue density occupies the majority of the right abdomen and displaces the bowel gas pattern inferiorly and toward the left. Correlation with prior CT imaging demonstrates marked hepatomegaly with extensive hepatic metastatic disease. The shadow on today's abdominal radiograph likely represents  the enlarged liver with a small amount of perihepatic ascites. No evidence of bowel obstruction. IMPRESSION: Abdominal distension likely due to hepatomegaly with diffuse metastatic disease. No evidence of bowel obstruction. Electronically Signed   By: Jacqulynn Cadet M.D.   On: 05/16/2022 08:35   CT ABDOMEN PELVIS W CONTRAST  Result Date: 05/02/2022 CLINICAL DATA:  Weakness, elevated LFTs, history of gastric cancer status post gastrectomy EXAM: CT ABDOMEN AND PELVIS WITH CONTRAST TECHNIQUE: Multidetector CT imaging of the abdomen and pelvis was performed using the standard protocol following bolus administration of intravenous contrast. RADIATION DOSE REDUCTION: This exam was performed according to the departmental dose-optimization program which includes automated exposure control, adjustment of the mA and/or kV according to patient size and/or use of iterative reconstruction technique. CONTRAST:  18m OMNIPAQUE IOHEXOL 300 MG/ML  SOLN COMPARISON:  PET-CT dated  01/18/2022 FINDINGS: Lower chest: Trace bilateral pleural effusions, right greater than left. Associated right lower lobe atelectasis. Hepatobiliary: Innumerable hepatic metastases in both lobes, new. Dominant aggregate lesion measures 8.8 cm in the lateral segment left hepatic lobe (series 2/image 25). Index lesion in the inferior right hepatic lobe measures 4.8 cm (series 2/image 50). Gallbladder is notable for layering sludge (series 2/image 52), without associated inflammatory changes. No intrahepatic or extrahepatic duct dilatation. Pancreas: Within normal limits. Spleen: Within normal limits. Adrenals/Urinary Tract: Adrenal glands are within normal limits. Kidneys are within normal limits.  No hydronephrosis. Bladder is mildly thick-walled although underdistended. Stomach/Bowel: Status post gastrectomy. Percutaneous jejunostomy.  No evidence of bowel obstruction. Appendix is not discretely visualized, reportedly surgically absent. No colonic wall thickening or mass is evident on CT. Vascular/Lymphatic: No evidence of abdominal aortic aneurysm. 14 mm short axis node in the portacaval region (series 2/image 33). Reproductive: Prostate is unremarkable. Other: Moderate abdominopelvic ascites, minimally complex. Musculoskeletal: Mild degenerative changes of the lumbar spine. IMPRESSION: Status post gastrectomy with percutaneous jejunostomy. Innumerable hepatic metastases, new, with index lesions as above. 14 mm short axis portacaval node, suspicious for nodal metastasis. Moderate abdominopelvic ascites. Trace bilateral pleural effusions. Electronically Signed   By: SJulian HyM.D.   On: 04/18/2022 19:28   [place] SITE RITE  Result Date: 05/06/2022 If Site Rite image not attached, placement could not be confirmed due to current cardiac rhythm.    Future Appointments  [place] Time [place] 05/24/2022  8:15 AM [facility] PHLEBOTOMIST [facility] None  05/24/2022  8:30 AM [facility] FLUSH  ROOM [facility] None  05/24/2022  8:45 AM TOwens Shark NP [facility] None  05/24/2022 10:00 AM [facility] CHAIR 1 [facility] None  05/26/2022  1:00 PM [facility] FLUSH ROOM [facility] None       LOS: 11 days

## 2022-05-18 ENCOUNTER — Encounter (HOSPITAL_COMMUNITY): Payer: Self-pay | Admitting: Internal Medicine

## 2022-05-18 DIAGNOSIS — R112 Nausea with vomiting, unspecified: Secondary | ICD-10-CM | POA: Diagnosis not present

## 2022-05-18 DIAGNOSIS — R1084 Generalized abdominal pain: Secondary | ICD-10-CM | POA: Diagnosis not present

## 2022-05-18 DIAGNOSIS — I10 Essential (primary) hypertension: Secondary | ICD-10-CM | POA: Diagnosis not present

## 2022-05-18 DIAGNOSIS — Z5111 Encounter for antineoplastic chemotherapy: Secondary | ICD-10-CM

## 2022-05-18 DIAGNOSIS — K7682 Hepatic encephalopathy: Secondary | ICD-10-CM

## 2022-05-18 DIAGNOSIS — C160 Malignant neoplasm of cardia: Secondary | ICD-10-CM | POA: Diagnosis not present

## 2022-05-18 LAB — GLUCOSE, CAPILLARY
Glucose-Capillary: 195 mg/dL — ABNORMAL HIGH (ref 70–99)
Glucose-Capillary: 196 mg/dL — ABNORMAL HIGH (ref 70–99)
Glucose-Capillary: 199 mg/dL — ABNORMAL HIGH (ref 70–99)
Glucose-Capillary: 199 mg/dL — ABNORMAL HIGH (ref 70–99)
Glucose-Capillary: 207 mg/dL — ABNORMAL HIGH (ref 70–99)
Glucose-Capillary: 223 mg/dL — ABNORMAL HIGH (ref 70–99)

## 2022-05-18 MED ORDER — PROSOURCE TF PO LIQD
45.0000 mL | Freq: Two times a day (BID) | ORAL | Status: DC
  Administered 2022-05-18 – 2022-05-19 (×2): 45 mL
  Administered 2022-05-19: 55 mL
  Administered 2022-05-20 (×2): 45 mL
  Filled 2022-05-18 (×6): qty 45

## 2022-05-18 MED ORDER — FREE WATER
200.0000 mL | Freq: Four times a day (QID) | Status: DC
Start: 2022-05-18 — End: 2022-05-21
  Administered 2022-05-18 – 2022-05-21 (×12): 200 mL

## 2022-05-18 MED ORDER — SODIUM CHLORIDE 0.9 % IV BOLUS
500.0000 mL | Freq: Once | INTRAVENOUS | Status: AC
  Administered 2022-05-18: 500 mL via INTRAVENOUS

## 2022-05-18 NOTE — Progress Notes (Signed)
Chaplain engaged in an initial visit with Zachary Bowers, his wife, and daughter.  Chaplain talked with them about the Advanced Directive consult.  Zachary Bowers desires to appoint his wife as his healthcare agent.  Chaplain did explain that because they're married she automatically has decision making power in terms of healthcare.  This was sufficient for family as they needed to know if they needed to get it done.  Zachary Bowers was also very tired at the moment and was not in a place to complete that paperwork.    Chaplain offered prayer over Zachary Bowers and his family.     05/18/22 1500  Clinical Encounter Type  Visited With Patient and family together  Visit Type Initial;Spiritual support  Spiritual Encounters  Spiritual Needs Prayer

## 2022-05-18 NOTE — Progress Notes (Addendum)
[facility] PROGRESS NOTE    Progress Note  Zachary Bowers  JYN:[id number] DOB: 10-17-1956 DOA: 04/24/2022 PCP: Shirline Frees, MD     Brief Narrative:   Zachary Bowers is an 66 y.o. male past medical history significant for CAD status post stents, essential hypertension, diabetes mellitus type 2, recently diagnosed with gastric cancer status post gastric resection and feeding tube on chemotherapy due to node positive.  Presents with 1 month history of nausea and vomiting and abdominal pain which is intractable CT raise concern for hepatic metastases.  Surgery, oncology and interventional radiology has been consulted.  We have been increase his tube feedings and is now at goal, his symptoms improved after having a discussion with him that we need to increase his tube feedings.  PICC line was inserted, he was started on chemotherapy on 05/07/2022.  Which she did not tolerate. He also developed acute kidney injury which resolved with fluid resuscitation and holding ACE inhibitor.     Assessment/Plan:   Intractable  Nausea & vomiting in the setting of  Gastric cancer [facility]) CT scan of the abdomen pelvis was negative for small bowel obstruction. Suggest to be tubings placed. Now resolved having regular bowel movements. Tolerating his tube feedings at 50 mils an hour.  Gastric cancer with metastatic hepatic disease new lesion:  Patient was started on chemotherapy FOLFOX/Herceptin on 05/07/2022, which he did not tolerate. Oncology on board, giving consideration for adjuvant immunotherapy initially. Now he is thought not to be a good candidate due to physical deconditioning and weight loss. Oncology recommend he is not a chemotherapy candidate at this time will have to be reevaluated as an outpatient. PICC line in place. Physical therapy evaluated the patient recommended skilled, but the family would like to take him home with home health. Dr. Ammie Dalton trying  to convince to go to  skilled nursing facility  Acute kidney injury: Likely prerenal azotemia. As he was not at goal with his tube feedings IV fluids were continued ACE inhibitor's on hold.  New acute hepatic encephalopathy: His urine is concentrated and ammonia level was high he was started on lactulose on 05/17/2022 his mentation is slowly improving. His mentation is slightly improved compared to yesterday, continues to have 3 bowel movements a day on lactulose.  Status post jejunostomy tube/nutrition: CT showed no obstructions, jejunostomy tube in place patient having, initially having difficulty tolerating his feedings. Now tube feedings are at goal. He is positive about 6 L.  Transaminitis Likely due to liver metastases. No biliary dilation seen on CT. LFTs continue to be elevated.  Diabetes mellitus type 2: With an A1c of 6.6 continue sliding scale insulin.  Normocytic anemia: Likely due to malignancy.  Essential hypertension/hyponatremia: Continue amlodipine, holding Avapro. Continue to monitor blood pressure.  Hypothyroidism: Continue Synthroid.  Hyperlipidemia: Continue statins.  Severe physical deconditioning: Physical therapy evaluated the patient, family would like to take him home instead of skilled nursing facility.   Nutrition: Estimated body mass index is 27.42 kg/m as calculated from the following:   Height as of this encounter: 6' (1.829 m).   Weight as of this encounter: 91.7 kg. Malnutrition Type:  Nutrition Problem: Severe Malnutrition Etiology: chronic illness, cancer and cancer related treatments   Malnutrition Characteristics:  Signs/Symptoms: energy intake < or equal to 75% for > or equal to 1 month, percent weight loss, moderate fat depletion, mild fat depletion, moderate muscle depletion   Nutrition Interventions:  Interventions: Tube feeding, Prostat, MVI    DVT prophylaxis: lovenox Family  Communication:wife Status is: Inpatient Remains inpatient  appropriate because: Until we can increase his feedings.    Code Status:     Code Status Orders  (From admission, onward)           Start     Ordered   04/27/2022 2246  Full code  Continuous        04/12/2022 2245           Code Status History     Date Active Date Inactive Code Status Order ID Comments User Context   02/11/2022 1532 02/20/2022 1855 Full Code [id number]  [name], MD Inpatient   10/08/2021 0736 10/11/2021 1557 Full Code [id number]  Neena Rhymes, MD ED   08/11/2021 1007 08/11/2021 2014 Full Code [id number]  Jettie Booze, MD Inpatient         IV Access:   Peripheral IV   Procedures and diagnostic studies:   DG Abd 1 View  Result Date: 05/16/2022 CLINICAL DATA:  Abdominal distension with tube feeds EXAM: ABDOMEN - 1 VIEW COMPARISON:  CT abdomen/pelvis 04/19/2022 FINDINGS: Jejunostomy tube present with the tip overlying the left mid abdomen. Large soft tissue density occupies the majority of the right abdomen and displaces the bowel gas pattern inferiorly and toward the left. Correlation with prior CT imaging demonstrates marked hepatomegaly with extensive hepatic metastatic disease. The shadow on today's abdominal radiograph likely represents the enlarged liver with a small amount of perihepatic ascites. No evidence of bowel obstruction. IMPRESSION: Abdominal distension likely due to hepatomegaly with diffuse metastatic disease. No evidence of bowel obstruction. Electronically Signed   By: Jacqulynn Cadet M.D.   On: 05/16/2022 08:35   CT HEAD WO CONTRAST (5MM)  Result Date: 05/17/2022 CLINICAL DATA:  Encephalopathy (Ped 0-17y). History of gastric cancer. EXAM: CT HEAD WITHOUT CONTRAST TECHNIQUE: Contiguous axial images were obtained from the base of the skull through the vertex without intravenous contrast. RADIATION DOSE REDUCTION: This exam was performed according to the departmental dose-optimization program which includes automated exposure  control, adjustment of the mA and/or kV according to patient size and/or use of iterative reconstruction technique. COMPARISON:  None Available. FINDINGS: Brain: No evidence of acute infarction, hemorrhage, hydrocephalus, extra-axial collection or mass lesion/mass effect. Periventricular white matter hypodensities consistent with sequela of chronic microvascular ischemic disease. Vascular: Vascular calcifications of the carotid siphons. Skull: Normal. Negative for fracture or focal lesion. Sinuses/Orbits: No acute finding. Other: None. IMPRESSION: No acute intracranial abnormality. There is persistent concern for intracranial metastases, recommend dedicated evaluation with brain MRI with and without contrast. Electronically Signed   By: [name] M.D.   On: 05/17/2022 12:17     Medical Consultants:   None.   Subjective:    [place] awake this morning wanted to eat.  Objective:    Vitals:   05/17/22 1200 05/17/22 1621 05/17/22 1949 05/18/22 0202  BP: 116/78 121/78 124/85 120/76  Pulse: (!) 112 (!) 121 (!) 110 (!) 110  Resp: '18 18 17 17  '$ Temp: (!) 97.4 F (36.3 C) (!) 97.4 F (36.3 C) 98 F (36.7 C) 98 F (36.7 C)  TempSrc: Oral Oral Oral   SpO2: 100% 100% 97% 97%  Weight:      Height:       SpO2: 97 %   Intake/Output Summary (Last 24 hours) at 05/18/2022 0700 Last data filed at 05/18/2022 0300 Gross per 24 hour  Intake --  Output 640 ml  Net -640 ml    [employer]  05/14/22 0710 05/15/22 0653 05/17/22 0713  Weight: 92.8 kg 93.1 kg 91.7 kg    Exam: General exam: In no acute distress. Respiratory system: Good air movement and clear to auscultation. Cardiovascular system: S1 & S2 heard, RRR. No JVD. Gastrointestinal system: Abdomen is nondistended, soft and nontender.  Extremities: No pedal edema. Skin: No rashes, lesions or ulcers Psychiatry: More awake today relates he is hungry.  Data Reviewed:    Labs: Basic Metabolic Panel: Recent Labs   Lab 05/12/22 0500 05/17/22 0718  NA 141 145  K 3.7 3.8  CL 108 111  CO2 27 25  GLUCOSE 107* 174*  BUN 43* 48*  CREATININE 0.81 0.78  CALCIUM 8.8* 9.2    GFR Estimated Creatinine Clearance: 99.7 mL/min (by C-G formula based on SCr of 0.78 mg/dL). Liver Function Tests: Recent Labs  Lab 05/12/22 0500 05/17/22 0718  AST 369* 234*  ALT 237* 156*  ALKPHOS 658* 606*  BILITOT 3.1* 2.0*  PROT 5.9* 6.1*  ALBUMIN 2.2* 2.3*    No results for input(s): LIPASE, AMYLASE in the last 168 hours.  Recent Labs  Lab 05/17/22 1215  AMMONIA 61*    Coagulation profile No results for input(s): INR, PROTIME in the last 168 hours.  COVID-19 Labs  No results for input(s): DDIMER, FERRITIN, LDH, CRP in the last 72 hours.  Lab Results  Component Value Date   SARSCOV2NAA NEGATIVE 02/10/2022   [place] NEGATIVE 10/08/2021    CBC: Recent Labs  Lab 05/14/22 0506 05/17/22 0739  WBC 10.0 8.1  NEUTROABS 8.9* 7.1  HGB 7.8* 8.5*  HCT 24.8* 26.4*  MCV 80.5 80.5  PLT 197 206    Cardiac Enzymes: No results for input(s): CKTOTAL, CKMB, CKMBINDEX, TROPONINI in the last 168 hours. BNP (last 3 results) No results for input(s): PROBNP in the last 8760 hours. CBG: Recent Labs  Lab 05/17/22 1217 05/17/22 1623 05/17/22 1945 05/18/22 0027 05/18/22 0432  GLUCAP 198* 243* 247* 199* 207*    D-Dimer: No results for input(s): DDIMER in the last 72 hours. Hgb A1c: No results for input(s): HGBA1C in the last 72 hours. Lipid Profile: No results for input(s): CHOL, HDL, LDLCALC, TRIG, CHOLHDL, LDLDIRECT in the last 72 hours. Thyroid function studies: No results for input(s): TSH, T4TOTAL, T3FREE, THYROIDAB in the last 72 hours.  Invalid input(s): FREET3 Anemia work up: No results for input(s): VITAMINB12, FOLATE, FERRITIN, TIBC, IRON, RETICCTPCT in the last 72 hours. Sepsis Labs: Recent Labs  Lab 05/14/22 0506 05/17/22 0739  WBC 10.0 8.1    Microbiology No results found for  this or any previous visit (from the past 240 hour(s)).    Medications:    amLODipine  10 mg Per Tube Daily   aspirin  81 mg Per Tube Daily   Chlorhexidine Gluconate Cloth  6 each Topical Daily   enoxaparin (LOVENOX) injection  40 mg Subcutaneous Q24H   feeding supplement (PROSource TF)  90 mL Per Tube TID   feeding supplement (VITAL 1.5 CAL)  1,000 mL Per Tube Q24H   free water  100 mL Per Tube Q6H   insulin aspart  0-15 Units Subcutaneous Q4H   insulin glargine-yfgn  5 Units Subcutaneous QHS   lactulose  20 g Oral TID   levothyroxine  125 mcg Per Tube QAC breakfast   metoCLOPramide (REGLAN) injection  10 mg Intravenous Q8H   sennosides  10 mL Per Tube BID   sodium chloride flush  10-40 mL Intracatheter Q12H   Continuous Infusions:  sodium  chloride        LOS: 12 days   Charlynne Cousins  [facility]  05/18/2022, 7:00 AM

## 2022-05-18 NOTE — Progress Notes (Addendum)
HEMATOLOGY-ONCOLOGY PROGRESS NOTE  ASSESSMENT AND PLAN: Gastric cancer CT abdomen/pelvis 10/08/2021-probable mass in the proximal stomach, enlarged gastropathic ligament node Upper endoscopy 10/01/2021-gastric cardia/fundus mass with oozing and stigmata of recent bleeding-biopsy at least intramucosal adenocarcinoma, mismatch repair protein expression intact, PD-L1 combined positive score 7%, HER2 positive PET 10/20/2021-hypermetabolic proximal gastric primary, or hypermetabolic gastropathic ligament node, right paramidline prostatic hypermetabolism, anal hypermetabolism without a CT mass Radiation 11/11/2021-12/22/2021 Cycle 1  Taxol/carboplatin 11/12/2021 Cycle 2 Taxol/carboplatin 11/19/2021 Cycle 3 Taxol/carboplatin 11/26/2021 Cycle 4 Taxol/carboplatin 12/03/2021 Cycle 5 Taxol/carboplatin 12/18/2021 PET 01/19/2022-decreased hypermetabolism at the gastric cardia, decreased size and metabolic activity associated with an enlarged gastrohepatic node, persistent anal hypermetabolism, stable lateral left lower lobe nodule, possible new more medial subpleural left lower lobe nodule-both nodules below PET resolution 02/11/2022-total gastrectomy with Roux-en-Y esophagojejunostomy, placement of jejunal feeding tube,ypT2,ypN1 moderate to poorly differentiated adenocarcinoma at the GE junction measuring 2.5 x 2.3 cm, lymphovascular invasion present, negative resection margins, 2/28 lymph nodes CT abdomen/pelvis with contrast 04/22/2022-innumerable hepatic metastases which are new, portacaval node suspicious for nodal metastasis. Cycle 1 FOLFOX/trastuzumab 05/07/2022   Microcytic anemia secondary to #1 Diabetes Hypertension Coronary artery disease, cardiac catheterization 08/11/2021-placement of proximal and distal right coronary artery stents Hypothyroidism Sleep apnea Anal hypermetabolism on the PET 10/20/2021-no mass identified Hospital admission 04/18/2022-nausea and vomiting, transaminitis.  Zachary Bowers is now  at day 12 following cycle 1 FOLFOX/trastuzumab.  He tolerated chemotherapy well.  Liver enzymes drawn 05/16/2022 remain elevated but improved.  He has developed encephalopathy of unclear etiology.  Ammonia level was elevated on 6/4 and he was started on lactulose.  Mentation improved this morning.  He is currently tolerating his J-tube feeding well.  Recommend that he continue on this and to take oral diet as tolerated.  His overall performance status has declined over the past few days.  Discussed with the patient and family that if his performance status does not improve, he may not be a candidate for additional systemic treatment.  Recommend for the patient to get out of bed is much as possible.  The family has indicated that they cannot take care of him at home and would consider SNF for rehab at this point.  He appears to have encephalopathy, likely secondary to hospital/critical illness delirium and hepatic encephalopathy.  Our plan remains to reevaluate him as an outpatient on 6/12 for his second cycle of chemotherapy.  He will receive this only if his performance status improves.  Recommendations: 1.  Continue current pain medication. 2.  Diet as tolerated.  Continue J-tube feeding, increase to goal rate as tolerated 3.  Increase time out of bed 4.  TOC referral to discuss placement. 5.  Chaplain consult placed for advance directives.  [name], NP  Zachary Bowers was interviewed and examined.  I saw him at approximately 7:15 this morning and again in the afternoon.  He was to be more alert today, though he remains very weak.  He is unable to get out of bed.  I discussed his current status and the prognosis with his wife and daughter.  They are unable to care for him at home in his current condition.  He will need a skilled nursing facility at discharge.  We will recommend hospice care if he is unable to complete additional systemic therapy.  I was present for greater than 50% of  today's visit.  I performed medical decision making. SUBJECTIVE: Still has some mild confusion this morning.  J-tube feeding running at 55 cc/h.  He is  not having any nausea, vomiting, constipation today.  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[place])    10/28/2021 Cancer Staging   Staging form: Stomach, AJCC 8th Edition - Clinical: Stage Unknown (cTX, cN1, cM0) - Signed by Ladell Pier, MD on 10/28/2021    11/12/2021 - 12/18/2021 Chemotherapy   Patient is on Treatment Plan : ESOPHAGUS Carboplatin/PACLitaxel weekly x 6 weeks with XRT        05/07/2022 -  Chemotherapy   Patient is on Treatment Plan : GASTROESOPHAGEAL FOLFOX D1,15 + Trastuzumab (6/4) D1,15 + Pembrolizumab (200) every 3 weeks, q28d      Gastric cancer ([place])  02/11/2022 Initial Diagnosis   Gastric cancer ([place])    03/04/2022 Cancer Staging   Staging form: Stomach, AJCC 8th Edition - Pathologic: Stage IIA (pT2, pN1, cM0) - Signed by Ladell Pier, MD on 03/04/2022 Histologic grade (G): G2 Histologic grading system: 3 grade system Residual tumor (R): R0 - None    04/02/2022 -  Chemotherapy   Patient is on Treatment Plan : HEAD/NECK Nivolumab q14d        PHYSICAL EXAMINATION:  Vitals:   05/18/22 0202 05/18/22 0720  BP: 120/76 113/75  Pulse: (!) 110 (!) 110  Resp: 17 17  Temp: 98 F (36.7 C) 97.9 F (36.6 C)  SpO2: 97% 100%   Filed Weights   05/14/22 0710 05/15/22 0653 05/17/22 0713  Weight: 92.8 kg 93.1 kg 91.7 kg    Intake/Output from previous day: 06/05 0701 - 06/06 0700 In: -  Out: 640 [Urine:640]  Physical exam: HEENT-no thrush Lungs-clear anteriorly Cardiac-regular rate and rhythm Abdomen-the liver is palpable in the right upper abdomen, left upper quadrant feeding tube Vascular-no leg edema  LABORATORY  DATA:  I have reviewed the data as listed    Latest Ref Rng & Units 05/17/2022    7:18 AM 05/12/2022    5:00 AM 05/11/2022    6:16 AM  CMP  Glucose 70 - 99 mg/dL 174   107   122    BUN 8 - 23 mg/dL 48   43   56    Creatinine 0.61 - 1.24 mg/dL 0.78   0.81   0.96    Sodium 135 - 145 mmol/L 145   141   140    Potassium 3.5 - 5.1 mmol/L 3.8   3.7   3.6    Chloride 98 - 111 mmol/L 111   108   106    CO2 22 - 32 mmol/L _0 Calcium 8.9 - 10.3 mg/dL 9.2   8.8   8.7    Total Protein 6.5 - 8.1 g/dL 6.1   5.9   6.1    Total Bilirubin 0.3 - 1.2 mg/dL 2.0   3.1   2.4    Alkaline Phos 38 - 126 U/L 606   658   664    AST 15 - 41 U/L 234   369   407    ALT 0 - 44 U/L 156   237   238      Lab Results  Component Value Date   WBC 8.1 05/17/2022   HGB 8.5 (L) 05/17/2022   HCT 26.4 (L) 05/17/2022   MCV 80.5 05/17/2022  PLT 206 05/17/2022   NEUTROABS 7.1 05/17/2022    Lab Results  Component Value Date   CEA 1.50 10/30/2021   PSA1 2.7 10/30/2021    DG Abd 1 View  Result Date: 05/16/2022 CLINICAL DATA:  Abdominal distension with tube feeds EXAM: ABDOMEN - 1 VIEW COMPARISON:  CT abdomen/pelvis 04/18/2022 FINDINGS: Jejunostomy tube present with the tip overlying the left mid abdomen. Large soft tissue density occupies the majority of the right abdomen and displaces the bowel gas pattern inferiorly and toward the left. Correlation with prior CT imaging demonstrates marked hepatomegaly with extensive hepatic metastatic disease. The shadow on today's abdominal radiograph likely represents the enlarged liver with a small amount of perihepatic ascites. No evidence of bowel obstruction. IMPRESSION: Abdominal distension likely due to hepatomegaly with diffuse metastatic disease. No evidence of bowel obstruction. Electronically Signed   By: Jacqulynn Cadet M.D.   On: 05/16/2022 08:35   CT HEAD WO CONTRAST (5MM)  Result Date: 05/17/2022 CLINICAL DATA:  Encephalopathy (Ped 0-17y). History of  gastric cancer. EXAM: CT HEAD WITHOUT CONTRAST TECHNIQUE: Contiguous axial images were obtained from the base of the skull through the vertex without intravenous contrast. RADIATION DOSE REDUCTION: This exam was performed according to the departmental dose-optimization program which includes automated exposure control, adjustment of the mA and/or kV according to patient size and/or use of iterative reconstruction technique. COMPARISON:  None Available. FINDINGS: Brain: No evidence of acute infarction, hemorrhage, hydrocephalus, extra-axial collection or mass lesion/mass effect. Periventricular white matter hypodensities consistent with sequela of chronic microvascular ischemic disease. Vascular: Vascular calcifications of the carotid siphons. Skull: Normal. Negative for fracture or focal lesion. Sinuses/Orbits: No acute finding. Other: None. IMPRESSION: No acute intracranial abnormality. There is persistent concern for intracranial metastases, recommend dedicated evaluation with brain MRI with and without contrast. Electronically Signed   By: [name] M.D.   On: 05/17/2022 12:17   CT ABDOMEN PELVIS W CONTRAST  Result Date: 05/08/2022 CLINICAL DATA:  Weakness, elevated LFTs, history of gastric cancer status post gastrectomy EXAM: CT ABDOMEN AND PELVIS WITH CONTRAST TECHNIQUE: Multidetector CT imaging of the abdomen and pelvis was performed using the standard protocol following bolus administration of intravenous contrast. RADIATION DOSE REDUCTION: This exam was performed according to the departmental dose-optimization program which includes automated exposure control, adjustment of the mA and/or kV according to patient size and/or use of iterative reconstruction technique. CONTRAST:  76m OMNIPAQUE IOHEXOL 300 MG/ML  SOLN COMPARISON:  PET-CT dated 01/18/2022 FINDINGS: Lower chest: Trace bilateral pleural effusions, right greater than left. Associated right lower lobe atelectasis. Hepatobiliary:  Innumerable hepatic metastases in both lobes, new. Dominant aggregate lesion measures 8.8 cm in the lateral segment left hepatic lobe (series 2/image 25). Index lesion in the inferior right hepatic lobe measures 4.8 cm (series 2/image 50). Gallbladder is notable for layering sludge (series 2/image 52), without associated inflammatory changes. No intrahepatic or extrahepatic duct dilatation. Pancreas: Within normal limits. Spleen: Within normal limits. Adrenals/Urinary Tract: Adrenal glands are within normal limits. Kidneys are within normal limits.  No hydronephrosis. Bladder is mildly thick-walled although underdistended. Stomach/Bowel: Status post gastrectomy. Percutaneous jejunostomy.  No evidence of bowel obstruction. Appendix is not discretely visualized, reportedly surgically absent. No colonic wall thickening or mass is evident on CT. Vascular/Lymphatic: No evidence of abdominal aortic aneurysm. 14 mm short axis node in the portacaval region (series 2/image 33). Reproductive: Prostate is unremarkable. Other: Moderate abdominopelvic ascites, minimally complex. Musculoskeletal: Mild degenerative changes of the lumbar spine. IMPRESSION: Status post gastrectomy with percutaneous  jejunostomy. Innumerable hepatic metastases, new, with index lesions as above. 14 mm short axis portacaval node, suspicious for nodal metastasis. Moderate abdominopelvic ascites. Trace bilateral pleural effusions. Electronically Signed   By: Julian Hy M.D.   On: 04/17/2022 19:28   [place] EKG SITE RITE  Result Date: 05/06/2022 If Site Rite image not attached, placement could not be confirmed due to current cardiac rhythm.    Future Appointments  Date Time Provider [place]  05/24/2022  8:15 AM [facility] PHLEBOTOMIST [facility] None  05/24/2022  8:30 AM [facility] FLUSH ROOM [facility] None  05/24/2022  8:45 AM Owens Shark, NP [facility] None  05/24/2022 10:00 AM [facility] CHAIR 1 [facility] None  05/26/2022  1:00 PM  [facility] FLUSH ROOM [facility] None       LOS: 12 days

## 2022-05-18 NOTE — Progress Notes (Signed)
Nutrition Follow-up  DOCUMENTATION CODES:   Severe malnutrition in context of chronic illness  INTERVENTION:   -Continue Vital 1.5 @ 55 ml/hr via J-tube x 24 hours -45 ml Prosource TF BID -Provides 2060 kcals, 111g protein and 1008 ml H2O  -Free water flushes of 200 ml every 6 hours (800 ml)  NUTRITION DIAGNOSIS:   Severe Malnutrition related to chronic illness, cancer and cancer related treatments as evidenced by energy intake < or equal to 75% for > or equal to 1 month, percent weight loss, moderate fat depletion, mild fat depletion, moderate muscle depletion.  Ongoing.  GOAL:   Patient will meet greater than or equal to 90% of their needs  Meeting with TF  MONITOR:   Labs, Weight trends, TF tolerance, I & O's PO intake  ASSESSMENT:   66 y.o. male with medical history significant of CAD s/p stent, HTN, HLD, DM2. Pt with recent history of stomach cancer s/p gastric resection and feeding tube.  On chemo therapy due to positive nodes.  Presented with 1 month history of nausea and vomiting with abdominal pain.  Progressed to intractable nausea and vomiting.  Was hospitalized for further management.  CT scan raised concern for hepatic metastases.  5/25: PICC placed 5/26: started FOLFOX plus trastuzumab  Patient now tolerating tube feedings at goal of 55 ml/hr x 24 hours. Will decrease Prosource TF now that at goal rate.   Per oncology, wants pt to discharge to SNF. Next treatment potentially on 6/12 if status improves.  Admission weight: 170 lbs. Current weight: 202 lbs  Medications: Lactulose, Reglan, Senokot  Labs reviewed: CBGs: 199-247  Diet Order:   Diet Order             Diet full liquid Room service appropriate? Yes; Fluid consistency: Thin  Diet effective now                   EDUCATION NEEDS:   Education needs have been addressed  Skin:  Skin Assessment: Reviewed RN Assessment  Last BM:  6/5 -type 6  Height:   Ht Readings from Last 1  Encounters:  04/14/2022 6' (1.829 m)    Weight:   Wt Readings from Last 1 Encounters:  05/17/22 91.7 kg    BMI:  Body mass index is 27.42 kg/m.  Estimated Nutritional Needs:   Kcal:  2200-2400  Protein:  110-120g  Fluid:  2.2L/day   Clayton Bibles, MS, RD, LDN Inpatient Clinical Dietitian Contact information available via Amion

## 2022-05-18 NOTE — Progress Notes (Signed)
PHYSICAL THERAPY  Pt did not feel well this morning.  He declined any OOB activity (recliner/BSC)  "I feel so weak" stated pt.  Will continue to follow and attempt to see another time/day pending schedule.  Rica Koyanagi  PTA Acute  Rehabilitation Services Pager      [phone] Office      [phone]

## 2022-05-19 DIAGNOSIS — R1084 Generalized abdominal pain: Secondary | ICD-10-CM | POA: Diagnosis not present

## 2022-05-19 DIAGNOSIS — C160 Malignant neoplasm of cardia: Secondary | ICD-10-CM | POA: Diagnosis not present

## 2022-05-19 DIAGNOSIS — R112 Nausea with vomiting, unspecified: Secondary | ICD-10-CM | POA: Diagnosis not present

## 2022-05-19 DIAGNOSIS — K7682 Hepatic encephalopathy: Secondary | ICD-10-CM | POA: Diagnosis not present

## 2022-05-19 DIAGNOSIS — E119 Type 2 diabetes mellitus without complications: Secondary | ICD-10-CM | POA: Diagnosis not present

## 2022-05-19 LAB — BASIC METABOLIC PANEL
Anion gap: 10 (ref 5–15)
BUN: 49 mg/dL — ABNORMAL HIGH (ref 8–23)
CO2: 23 mmol/L (ref 22–32)
Calcium: 9.1 mg/dL (ref 8.9–10.3)
Chloride: 110 mmol/L (ref 98–111)
Creatinine, Ser: 0.81 mg/dL (ref 0.61–1.24)
GFR, Estimated: >60 mL/min (ref >60)
Glucose, Bld: 229 mg/dL — ABNORMAL HIGH (ref 70–99)
Potassium: 4.1 mmol/L (ref 3.5–5.1)
Sodium: 143 mmol/L (ref 135–145)

## 2022-05-19 LAB — GLUCOSE, CAPILLARY
Glucose-Capillary: 162 mg/dL — ABNORMAL HIGH (ref 70–99)
Glucose-Capillary: 177 mg/dL — ABNORMAL HIGH (ref 70–99)
Glucose-Capillary: 197 mg/dL — ABNORMAL HIGH (ref 70–99)
Glucose-Capillary: 204 mg/dL — ABNORMAL HIGH (ref 70–99)
Glucose-Capillary: 218 mg/dL — ABNORMAL HIGH (ref 70–99)
Glucose-Capillary: 236 mg/dL — ABNORMAL HIGH (ref 70–99)
Glucose-Capillary: 280 mg/dL — ABNORMAL HIGH (ref 70–99)

## 2022-05-19 NOTE — Progress Notes (Addendum)
[facility] Progress Note  Patient: Zachary Bowers     XTK:[id number]  DOA: 05/10/2022   PCP: Shirline Frees, MD       Brief hospital course: This is a [age]-year-old male with gastric cancer status post total gastric resection with Roux-en-Y esophagojejunostomy and placement of J tube on 02/11/2022 who is receiving chemotherapy.  He also has a history of diabetes mellitus type 2, hypothyroidism, sleep apnea coronary artery disease with stents, essential hypertension.  He presented with nausea vomiting and abdominal pain which is felt to be due to metastatic disease (new mets to the liver) and recent chemotherapy. Spittle course has been complicated by acute metabolic encephalopathy and severe physical deconditioning to a point where the patient will require skilled nursing facility.    Chemotherapy has been put on hold to see if his performance status improves.  If no improvement, will need to consider comfort care.  Subjective:  Appears lethargic but, surprisingly, is able to follow some commands. Poorly communicative.  Does not answer questions.  Assessment and Plan: Principal Problem:   Nausea & vomiting, intractable - No small bowel obstruction noted on CT scan and J-tube was noted to be appropriately situated - Currently appears to be tolerating tube feeds at 50 cc an hour without vomiting  Active Problems: Diarrhea- numerous episodes - stop Lactulose, Reglan and Senna -Continue free water per tube    Gastric cancer [facility]) - New hepatic metastasis and transaminitis noted - Given FOLFOX and Herceptin on 04/17/2022 which he did not tolerate well - Continues to have a PICC line - Chemo on hold to see if deconditioning improves  Acute metabolic encephalopathy - Possibly hepatic encephalopathy - Has been started on lactulose as of 05/17/2022- will hold today due to excessive diarrhea  Diabetes mellitus type 2 - A1c 6.6 - Continue sliding scale insulin  Hypertension -  Continue amlodipine  Hypothyroidism - Continue Synthroid  Severe physical deconditioning Severe protein calorie malnutrition - In setting of chronic illness - Patient has energy intake < or equal to 75% for > or equal to 1 month, percent weight loss, moderate fat depletion, mild fat depletion, moderate muscle depletion -Continue Pro-Stat multivitamins and tube feeds -Declined to work with PT yesterday because he felt too weak -OT did not work with him today because of diarrhea -I have requested a palliative care consult as I feel that he needs hospice/palliative care at this point - he did not tolerate his previous chemotherapy well & he has become progressively more weak    DVT prophylaxis:  enoxaparin (LOVENOX) injection 40 mg Start: 05/06/22 1000     Code Status: Full Code  Consultants: Oncology Level of Care: Level of care: Med-Surg Disposition Plan:  Status is: Inpatient Remains inpatient appropriate because: Diarrhea    Objective:   Vitals:   05/18/22 0720 05/18/22 1319 05/18/22 2014 05/19/22 0415  BP: 113/75 119/74 112/82 115/79  Pulse: (!) 110 (!) 109 (!) 120 (!) 124  Resp: '17 20 18   '$ Temp: 97.9 F (36.6 C) 97.6 F (36.4 C) 97.8 F (36.6 C) 98 F (36.7 C)  TempSrc: Oral Oral Oral Oral  SpO2: 100% 96% 97% 98%  Weight:      Height:       Filed Weights   05/14/22 0710 05/15/22 0653 05/17/22 0713  Weight: 92.8 kg 93.1 kg 91.7 kg   Exam: General exam: Appears lethargic-eyes are open and appear glazed-mouth is open-arms are hanging at his sides HEENT: PERRLA, oral mucosa moist, no  sclera icterus or thrush Respiratory system: Clear to auscultation. Respiratory effort normal. Cardiovascular system: S1 & S2 heard, regular rate and rhythm Gastrointestinal system: Abdomen soft, non-tender, nondistended. Normal bowel sounds-J-tube is present Central nervous system: Alert and oriented.  Unable to lift legs off the bed-can barely move his arms Extremities: No cyanosis,  clubbing-has pedal edema bilaterally  Skin: No rashes or ulcers Psychiatry: Flat affect   Imaging and lab data was personally reviewed    CBC: Recent Labs  Lab 05/14/22 0506 05/17/22 0739  WBC 10.0 8.1  NEUTROABS 8.9* 7.1  HGB 7.8* 8.5*  HCT 24.8* 26.4*  MCV 80.5 80.5  PLT 197 101   Basic Metabolic Panel: Recent Labs  Lab 05/17/22 0718  NA 145  K 3.8  CL 111  CO2 25  GLUCOSE 174*  BUN 48*  CREATININE 0.78  CALCIUM 9.2   GFR: Estimated Creatinine Clearance: 99.7 mL/min (by C-G formula based on SCr of 0.78 mg/dL).  Scheduled Meds:  amLODipine  10 mg Per Tube Daily   aspirin  81 mg Per Tube Daily   Chlorhexidine Gluconate Cloth  6 each Topical Daily   enoxaparin (LOVENOX) injection  40 mg Subcutaneous Q24H   feeding supplement (PROSource TF)  45 mL Per Tube BID   feeding supplement (VITAL 1.5 CAL)  1,000 mL Per Tube Q24H   free water  200 mL Per Tube Q6H   insulin aspart  0-15 Units Subcutaneous Q4H   insulin glargine-yfgn  5 Units Subcutaneous QHS   lactulose  20 g Oral TID   levothyroxine  125 mcg Per Tube QAC breakfast   metoCLOPramide (REGLAN) injection  10 mg Intravenous Q8H   sennosides  10 mL Per Tube BID   sodium chloride flush  10-40 mL Intracatheter Q12H   Continuous Infusions:  sodium chloride       LOS: 13 days   Author: Debbe Odea  67/06/2022 8:02 AM

## 2022-05-19 NOTE — Progress Notes (Signed)
Tachycardia triggering yellow MEWS protocol. Unchanged assessment from prior. No distress noted. Pt resting well in bed with wife at bedside. Will continue to monitor.   05/19/22 1400  Assess: MEWS Score  Temp 97.8 F (36.6 C)  BP 118/80  MAP (mmHg) 91  Pulse Rate (!) 126  Resp 18  Level of Consciousness Alert  SpO2 95 %  O2 Device Room Air  Assess: MEWS Score  MEWS Temp 0  MEWS Systolic 0  MEWS Pulse 2  MEWS RR 0  MEWS LOC 0  MEWS Score 2  MEWS Score Color Yellow  Assess: if the MEWS score is Yellow or Red  Were vital signs taken at a resting state? Yes  Focused Assessment No change from prior assessment  Does the patient meet 2 or more of the SIRS criteria? No  MEWS guidelines implemented *See Row Information* No, previously yellow, continue vital signs every 4 hours  Treat  MEWS Interventions Administered scheduled meds/treatments  Pain Scale 0-10  Pain Score Asleep  Pain Intervention(s) Repositioned  Take Vital Signs  Increase Vital Sign Frequency  Yellow: Q 2hr X 2 then Q 4hr X 2, if remains yellow, continue Q 4hrs  Escalate  MEWS: Escalate Yellow: discuss with charge nurse/RN and consider discussing with provider and RRT  Notify: Charge Nurse/RN  Name of Charge Nurse/RN Notified Curator  Date Charge Nurse/RN Notified 05/19/22  Time Charge Nurse/RN Notified 1400  Document  Patient Outcome Stabilized after interventions (unchanged assessment  will continue to monitor)  Progress note created (see row info) Yes  Assess: SIRS CRITERIA  SIRS Temperature  0  SIRS Pulse 1  SIRS Respirations  0  SIRS WBC 0  SIRS Score Sum  1   MEWS Guidelines - (patients age 36 and over)

## 2022-05-19 NOTE — Care Management Important Message (Signed)
Important Message  Patient Details IM Letter placed in Patients room. Name: Zachary Bowers MRN: [id number] Date of Birth: 07-16-1956   Medicare Important Message Given:  Yes     Kerin Salen 05/19/2022, 10:57 AM

## 2022-05-19 NOTE — TOC Progression Note (Signed)
Transition of Care [facility]) - Progression Note    Patient Details  Name: Zachary Bowers MRN: [id number] Date of Birth: 16-Apr-1956  [facility] of [facility]) CM/SW Contact  Salaya Holtrop, Marjie Skiff, RN Phone Number: 05/19/2022, 11:21 AM  Clinical Narrative:    Pt does not appear to be doing as well medically. Dispo plan had been for home with home health. If pt now needs to dc to short term rehab for SNF a PT recommendation of SNF is needed for insurance authorization. TOC will continue to follow along.   Expected Discharge Plan: [place] Barriers to Discharge: Continued Medical Work up  Expected Discharge Plan and Services Expected Discharge Plan: [place] In-house Referral: Clinical Social Work   Post Acute Care Choice: Durable Medical Equipment, [place] arrangements for the past 2 months: Single Family Home                 DME Arranged: [place] DME Agency: [employer] Date DME Agency Contacted: 05/10/22 Time DME Agency Contacted: 1137 Representative spoke with at DME Agency: Andee Poles HH Arranged: PT, OT [place] Agency: [place] Date [place]: 05/11/22 Time [place]: [place] Representative spoke with at [place]: [place] ([place]) Interventions    Readmission Risk Interventions    05/10/2022   11:43 AM  Readmission Risk Prevention Plan  HRI or Home Care Consult Complete  Social Work Consult for [place] Planning/Counseling Complete  Palliative Care Screening Not Applicable

## 2022-05-19 NOTE — Progress Notes (Signed)
HEMATOLOGY-ONCOLOGY PROGRESS NOTE  ASSESSMENT AND PLAN: Gastric cancer CT abdomen/pelvis 10/08/2021-probable mass in the proximal stomach, enlarged gastropathic ligament node Upper endoscopy 10/01/2021-gastric cardia/fundus mass with oozing and stigmata of recent bleeding-biopsy at least intramucosal adenocarcinoma, mismatch repair protein expression intact, PD-L1 combined positive score 7%, HER2 positive PET 10/20/2021-hypermetabolic proximal gastric primary, or hypermetabolic gastropathic ligament node, right paramidline prostatic hypermetabolism, anal hypermetabolism without a CT mass Radiation 11/11/2021-12/22/2021 Cycle 1  Taxol/carboplatin 11/12/2021 Cycle 2 Taxol/carboplatin 11/19/2021 Cycle 3 Taxol/carboplatin 11/26/2021 Cycle 4 Taxol/carboplatin 12/03/2021 Cycle 5 Taxol/carboplatin 12/18/2021 PET 01/19/2022-decreased hypermetabolism at the gastric cardia, decreased size and metabolic activity associated with an enlarged gastrohepatic node, persistent anal hypermetabolism, stable lateral left lower lobe nodule, possible new more medial subpleural left lower lobe nodule-both nodules below PET resolution 02/11/2022-total gastrectomy with Roux-en-Y esophagojejunostomy, placement of jejunal feeding tube,ypT2,ypN1 moderate to poorly differentiated adenocarcinoma at the GE junction measuring 2.5 x 2.3 cm, lymphovascular invasion present, negative resection margins, 2/28 lymph nodes CT abdomen/pelvis with contrast 04/13/2022-innumerable hepatic metastases which are new, portacaval node suspicious for nodal metastasis. Cycle 1 FOLFOX/trastuzumab 05/07/2022   Microcytic anemia secondary to #1 Diabetes Hypertension Coronary artery disease, cardiac catheterization 08/11/2021-placement of proximal and distal right coronary artery stents Hypothyroidism Sleep apnea Anal hypermetabolism on the PET 10/20/2021-no mass identified Hospital admission 04/26/2022-nausea and vomiting, transaminitis.  Mr. Conigliaro is now  at day 13 following cycle 1 FOLFOX/trastuzumab.  He tolerated chemotherapy well.  Liver enzymes drawn 05/16/2022 remain elevated .  He remains in a very weakened condition.  He will not be a candidate for further chemotherapy unless his performance status improves.  I recommend getting him out of bed as tolerated.  I recommend hospice care if he is unable to participate in physical therapy and remains bedbound.     Recommendations: 1.  Continue current pain medication. 2.  Diet as tolerated.  Continue J-tube feeding 3.  Physical therapy as tolerated 4.  Hospice referral if his clinical status does not improve over the next 2 days   Betsy Coder, MD  SUBJECTIVE: He is lethargic after receiving morphine a few hours ago. 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[place])  10/28/2021 Initial Diagnosis   Malignant neoplasm of cardia of stomach ([place])    10/28/2021 Cancer Staging   Staging form: Stomach, AJCC 8th Edition - Clinical: Stage Unknown (cTX, cN1, cM0) - Signed by Ladell Pier, MD on 10/28/2021    11/12/2021 - 12/18/2021 Chemotherapy   Patient is on Treatment Plan : ESOPHAGUS Carboplatin/PACLitaxel weekly x 6 weeks with XRT        05/07/2022 -  Chemotherapy   Patient is on Treatment Plan : GASTROESOPHAGEAL FOLFOX D1,15 + Trastuzumab (6/4) D1,15 + Pembrolizumab (200) every 3 weeks, q28d      Gastric cancer ([place])  02/11/2022 Initial Diagnosis   Gastric cancer ([place])    03/04/2022 Cancer Staging   Staging form: Stomach, AJCC 8th Edition - Pathologic: Stage IIA (pT2, pN1, cM0) - Signed by Ladell Pier, MD on 03/04/2022 Histologic grade (G): G2 Histologic grading system: 3 grade system Residual tumor (R): R0 - None    04/02/2022 -  Chemotherapy   Patient is on Treatment Plan : HEAD/NECK Nivolumab q14d        PHYSICAL EXAMINATION:  Vitals:   05/18/22 2014 05/19/22 0415  BP: 112/82 115/79  Pulse: (!) 120 (!) 124  Resp: 18   Temp: 97.8 F (36.6 C) 98  F (36.7 C)  SpO2: 97% 98%   Filed Weights  05/14/22 0710 05/15/22 0653 05/17/22 0713  Weight: 204 lb 9.4 oz (92.8 kg) 205 lb 4 oz (93.1 kg) 202 lb 2.6 oz (91.7 kg)    Intake/Output from previous day: 06/06 0701 - 06/07 0700 In: 5725 [NG/GT:5725] Out: 200 [Urine:200]  Physical exam: HEENT-no thrush Lungs-clear anteriorly Cardiac-regular rate and rhythm, tachycardia Abdomen-the liver is palpable in the right upper abdomen, left upper quadrant feeding tube, mildly distended Vascular-no leg edema Neurologic: Lethargic-arousable, oriented to place, follows simple commands  LABORATORY DATA:  I have reviewed the data as listed    Latest Ref Rng & Units 05/19/2022   11:58 AM 05/17/2022    7:18 AM 05/12/2022    5:00 AM  CMP  Glucose 70 - 99 mg/dL 229   174   107    BUN 8 - 23 mg/dL 49   48   43    Creatinine 0.61 - 1.24 mg/dL 0.81   0.78   0.81    Sodium 135 - 145 mmol/L 143   145   141    Potassium 3.5 - 5.1 mmol/L 4.1   3.8   3.7    Chloride 98 - 111 mmol/L 110   111   108    CO2 22 - 32 mmol/L _0 Calcium 8.9 - 10.3 mg/dL 9.1   9.2   8.8    Total Protein 6.5 - 8.1 g/dL  6.1   5.9    Total Bilirubin 0.3 - 1.2 mg/dL  2.0   3.1    Alkaline Phos 38 - 126 U/L  606   658    AST 15 - 41 U/L  234   369    ALT 0 - 44 U/L  156   237      Lab Results  Component Value Date   WBC 8.1 05/17/2022   HGB 8.5 (L) 05/17/2022   HCT 26.4 (L) 05/17/2022   MCV 80.5 05/17/2022   PLT 206 05/17/2022   NEUTROABS 7.1 05/17/2022    Lab Results  Component Value Date   CEA 1.50 10/30/2021   PSA1 2.7 10/30/2021    DG Abd 1 View  Result Date: 05/16/2022 CLINICAL DATA:  Abdominal distension with tube feeds EXAM: ABDOMEN - 1 VIEW COMPARISON:  CT abdomen/pelvis 05/12/2022 FINDINGS: Jejunostomy tube present with the tip overlying the left mid abdomen. Large soft tissue density occupies the majority of the right abdomen and displaces the bowel gas pattern inferiorly and toward the left.  Correlation with prior CT imaging demonstrates marked hepatomegaly with extensive hepatic metastatic disease. The shadow on today's abdominal radiograph likely represents the enlarged liver with a small amount of perihepatic ascites. No evidence of bowel obstruction. IMPRESSION: Abdominal distension likely due to hepatomegaly with diffuse metastatic disease. No evidence of bowel obstruction. Electronically Signed   By: Jacqulynn Cadet M.D.   On: 05/16/2022 08:35   CT HEAD WO CONTRAST (5MM)  Result Date: 05/17/2022 CLINICAL DATA:  Encephalopathy (Ped 0-17y). History of gastric cancer. EXAM: CT HEAD WITHOUT CONTRAST TECHNIQUE: Contiguous axial images were obtained from the base of the skull through the vertex without intravenous contrast. RADIATION DOSE REDUCTION: This exam was performed according to the departmental dose-optimization program which includes automated exposure control, adjustment of the mA and/or kV according to patient size and/or use of iterative reconstruction technique. COMPARISON:  None Available. FINDINGS: Brain: No evidence of acute infarction, hemorrhage, hydrocephalus, extra-axial collection or mass lesion/mass effect. Periventricular white matter hypodensities  consistent with sequela of chronic microvascular ischemic disease. Vascular: Vascular calcifications of the carotid siphons. Skull: Normal. Negative for fracture or focal lesion. Sinuses/Orbits: No acute finding. Other: None. IMPRESSION: No acute intracranial abnormality. There is persistent concern for intracranial metastases, recommend dedicated evaluation with brain MRI with and without contrast. Electronically Signed   By: [name] M.D.   On: 05/17/2022 12:17   CT ABDOMEN PELVIS W CONTRAST  Result Date: 04/30/2022 CLINICAL DATA:  Weakness, elevated LFTs, history of gastric cancer status post gastrectomy EXAM: CT ABDOMEN AND PELVIS WITH CONTRAST TECHNIQUE: Multidetector CT imaging of the abdomen and pelvis was  performed using the standard protocol following bolus administration of intravenous contrast. RADIATION DOSE REDUCTION: This exam was performed according to the departmental dose-optimization program which includes automated exposure control, adjustment of the mA and/or kV according to patient size and/or use of iterative reconstruction technique. CONTRAST:  57m OMNIPAQUE IOHEXOL 300 MG/ML  SOLN COMPARISON:  PET-CT dated 01/18/2022 FINDINGS: Lower chest: Trace bilateral pleural effusions, right greater than left. Associated right lower lobe atelectasis. Hepatobiliary: Innumerable hepatic metastases in both lobes, new. Dominant aggregate lesion measures 8.8 cm in the lateral segment left hepatic lobe (series 2/image 25). Index lesion in the inferior right hepatic lobe measures 4.8 cm (series 2/image 50). Gallbladder is notable for layering sludge (series 2/image 52), without associated inflammatory changes. No intrahepatic or extrahepatic duct dilatation. Pancreas: Within normal limits. Spleen: Within normal limits. Adrenals/Urinary Tract: Adrenal glands are within normal limits. Kidneys are within normal limits.  No hydronephrosis. Bladder is mildly thick-walled although underdistended. Stomach/Bowel: Status post gastrectomy. Percutaneous jejunostomy.  No evidence of bowel obstruction. Appendix is not discretely visualized, reportedly surgically absent. No colonic wall thickening or mass is evident on CT. Vascular/Lymphatic: No evidence of abdominal aortic aneurysm. 14 mm short axis node in the portacaval region (series 2/image 33). Reproductive: Prostate is unremarkable. Other: Moderate abdominopelvic ascites, minimally complex. Musculoskeletal: Mild degenerative changes of the lumbar spine. IMPRESSION: Status post gastrectomy with percutaneous jejunostomy. Innumerable hepatic metastases, new, with index lesions as above. 14 mm short axis portacaval node, suspicious for nodal metastasis. Moderate abdominopelvic  ascites. Trace bilateral pleural effusions. Electronically Signed   By: SJulian HyM.D.   On: 05/12/2022 19:28   [place] SITE RITE  Result Date: 05/06/2022 If Site Rite image not attached, placement could not be confirmed due to current cardiac rhythm.    Future Appointments  Date Time Provider DToone 05/24/2022  8:15 AM [facility] PHLEBOTOMIST [facility] None  05/24/2022  8:30 AM [facility] FLUSH ROOM [facility] None  05/24/2022  8:45 AM TOwens Shark NP [facility] None  05/24/2022 10:00 AM [facility] CHAIR 1 [facility] None  05/26/2022  1:00 PM [facility] FLUSH ROOM [facility] None       LOS: 13 days

## 2022-05-19 NOTE — Progress Notes (Signed)
Occupational Therapy Treatment Patient Details Name: Zachary Bowers MRN: [id number] DOB: 02/21/1956 Today's Date: 05/19/2022   History of present illness Zachary Bowers is an 66 y.o. male past medical history significant for CAD status post stents, essential hypertension, diabetes mellitus type 2, recently diagnosed with gastric cancer status post gastric resection and feeding tube on chemotherapy due to node positive.  Presents with 1 month history of nausea and vomiting and abdominal pain which is intractable CT raise concern for hepatic metastases.  Oncology and interventional radiology has been consulted.   OT comments  Patient was noted to be very lethargic on this date with recent very loose BM. Patient was +2 for bed mobility and hygiene tasks with increased time. Patient noted to have HR in 120s during session with O2 above 97% on RA. Patients rehab potential is guarded at this time. Nurse reported that there is a plan to have plan of care meeting with family soon. OT to continue to follow acutely.     Recommendations for follow up therapy are one component of a multi-disciplinary discharge planning process, led by the attending physician.  Recommendations may be updated based on patient status, additional functional criteria and insurance authorization.    Follow Up Recommendations  Skilled nursing-short term rehab (<3 hours/day)    Assistance Recommended at Discharge Frequent or constant Supervision/Assistance  Patient can return home with the following  Two people to help with walking and/or transfers;A lot of help with bathing/dressing/bathroom;Assistance with cooking/housework;Direct supervision/assist for financial management;Help with stairs or ramp for entrance;Assist for transportation;Direct supervision/assist for medications management   Equipment Recommendations  BSC/3in1    Recommendations for Other Services      Precautions / Restrictions Precautions Precautions:  Fall Precaution Comments: N/V, chemo precautions, fell 6/4 Restrictions Weight Bearing Restrictions: No       Mobility Bed Mobility   Bed Mobility: Rolling Rolling: Max assist, +2 for safety/equipment         General bed mobility comments: required increased asisst this session.  profoundly weak.    Transfers                         Balance                                           ADL either performed or assessed with clinical judgement   ADL Overall ADL's : Needs assistance/impaired                             Toileting- Clothing Manipulation and Hygiene: Total assistance;Bed level Toileting - Clothing Manipulation Details (indicate cue type and reason): + 2 for rolling and safety with increased time. patient noted to attempt to hold self in sidelying with BUE but still needing icreased A to maintain. patient lethargic during session and HR noted to increase to 125 bpm with O2 97% on RA. nurse made aware.            Extremity/Trunk Assessment              Vision       Perception     Praxis      Cognition Arousal/Alertness: Lethargic Behavior During Therapy: Flat affect Overall Cognitive Status: Impaired/Different from baseline  General Comments: very lethargic during this session.        Exercises      Shoulder Instructions       General Comments      Pertinent Vitals/ Pain       Pain Assessment Pain Assessment: Faces Faces Pain Scale: Hurts even more Pain Location: no specific location noted Pain Descriptors / Indicators: Grimacing, Discomfort Pain Intervention(s): Monitored during session (nurse made aware)  Home Living                                          Prior Functioning/Environment              Frequency  Min 2X/week        Progress Toward Goals  OT Goals(current goals can now be found in the care plan  section)  Progress towards OT goals: Not progressing toward goals - comment (increased lethargic presentation with increased weakness)     Plan Other (comment) (patient's plan of care is being addressed with family later today per nurse)    Co-evaluation                 AM-PAC OT "6 Clicks" Daily Activity     Outcome Measure   Help from another person eating meals?: A Little Help from another person taking care of personal grooming?: A Little Help from another person toileting, which includes using toliet, bedpan, or urinal?: Total Help from another person bathing (including washing, rinsing, drying)?: A Lot Help from another person to put on and taking off regular upper body clothing?: A Lot Help from another person to put on and taking off regular lower body clothing?: Total 6 Click Score: 12    End of Session    OT Visit Diagnosis: Unsteadiness on feet (R26.81);Other abnormalities of gait and mobility (R26.89);Muscle weakness (generalized) (M62.81)   Activity Tolerance Patient limited by fatigue;Patient limited by pain   Patient Left in bed;with call bell/phone within reach;with nursing/sitter in room   Nurse Communication Other (comment) (vitals during session)        Time: 5681-2751 OT Time Calculation (min): 20 min  Charges: OT General Charges $OT Visit: 1 Visit OT Treatments $Self Care/Home Management : 8-22 mins  Jackelyn Poling OTR/L, MS Acute Rehabilitation Department Office# [phone] Pager# [phone]   Marcellina Millin 05/19/2022, 9:54 AM

## 2022-05-20 DIAGNOSIS — Z515 Encounter for palliative care: Secondary | ICD-10-CM | POA: Diagnosis not present

## 2022-05-20 DIAGNOSIS — Z7189 Other specified counseling: Secondary | ICD-10-CM

## 2022-05-20 DIAGNOSIS — E43 Unspecified severe protein-calorie malnutrition: Secondary | ICD-10-CM | POA: Diagnosis not present

## 2022-05-20 DIAGNOSIS — R112 Nausea with vomiting, unspecified: Secondary | ICD-10-CM | POA: Diagnosis not present

## 2022-05-20 DIAGNOSIS — C160 Malignant neoplasm of cardia: Secondary | ICD-10-CM | POA: Diagnosis not present

## 2022-05-20 LAB — BASIC METABOLIC PANEL
Anion gap: 9 (ref 5–15)
BUN: 42 mg/dL — ABNORMAL HIGH (ref 8–23)
CO2: 24 mmol/L (ref 22–32)
Calcium: 9.2 mg/dL (ref 8.9–10.3)
Chloride: 113 mmol/L — ABNORMAL HIGH (ref 98–111)
Creatinine, Ser: 0.81 mg/dL (ref 0.61–1.24)
GFR, Estimated: >60 mL/min (ref >60)
Glucose, Bld: 202 mg/dL — ABNORMAL HIGH (ref 70–99)
Potassium: 4.5 mmol/L (ref 3.5–5.1)
Sodium: 146 mmol/L — ABNORMAL HIGH (ref 135–145)

## 2022-05-20 LAB — GLUCOSE, CAPILLARY
Glucose-Capillary: 194 mg/dL — ABNORMAL HIGH (ref 70–99)
Glucose-Capillary: 145 mg/dL — ABNORMAL HIGH (ref 70–99)
Glucose-Capillary: 202 mg/dL — ABNORMAL HIGH (ref 70–99)
Glucose-Capillary: 179 mg/dL — ABNORMAL HIGH (ref 70–99)
Glucose-Capillary: 183 mg/dL — ABNORMAL HIGH (ref 70–99)

## 2022-05-20 MED ORDER — LACTULOSE 10 GM/15ML PO SOLN
10.0000 g | Freq: Two times a day (BID) | ORAL | Status: DC
Start: 2022-05-20 — End: 2022-05-21
  Administered 2022-05-20 (×2): 10 g via ORAL
  Filled 2022-05-20 (×2): qty 15

## 2022-05-20 MED ORDER — SENNA 8.6 MG PO TABS: 1.0000 | ORAL_TABLET | Freq: Every day | ORAL | Status: DC | PRN

## 2022-05-20 NOTE — Progress Notes (Signed)
Physical Therapy Treatment Patient Details Name: Zachary Bowers MRN: [id number] DOB: [birth year] Today's Date: 05/20/2022   History of Present Illness This is a [age]-year-old male with gastric cancer status post total gastric resection with Roux-en-Y esophagojejunostomy and placement of J tube on 02/11/2022 who is receiving chemotherapy.  He also has a history of diabetes mellitus type 2, hypothyroidism, sleep apnea coronary artery disease with stents, essential hypertension.  He presented with nausea vomiting and abdominal pain which is felt to be due to metastatic disease (new mets to the liver) and recent chemotherapy.  Spittle course has been complicated by acute metabolic encephalopathy and severe physical deconditioning to a point where the patient will require skilled nursing facility.       Chemotherapy has been put on hold to see if his performance status improves.  If no improvement, will need to consider comfort care. (per MD)    PT Comments    Unfortunately pt is NOT progressing with his mobility.  General bed mobility comments: pt profoundly weak requiring increased assist.  Pt unable to actively move B LE to EOB and pt unable to offer much self assist with UE's.  Once EOB, pr present with slumped/collapsed posture and required MAX Assist to prevent forward LOB.  Pt c/o dizziness.  BP 125/78 with increased HR 125.  Limited tolerance no more than 2 min.  Required Total Asisst back to bed and position to comfort.  General transfer comment: pt required Max/Toatal Assist to perform partial sit to stand from elevated bed with Therapist "Bear Hug" frontal approach to block knees from buckling.  Pt was only ablew to offer 15% self assist and unable to support his his weight.  Immediately needing to sit back down and unable to complete 1/4 pivot to recliner. Will consult LPT.  On eval family was hoping to take pt home and receive [facility] services however pt is not progressing and is currently requiring + 2  Total Asisst.    Recommendations for follow up therapy are one component of a multi-disciplinary discharge planning process, led by the attending physician.  Recommendations may be updated based on patient status, additional functional criteria and insurance authorization.  Follow Up Recommendations  Other (comment) (Comfort Care)     Assistance Recommended at Discharge Frequent or constant Supervision/Assistance  Patient can return home with the following A lot of help with walking and/or transfers;Help with stairs or ramp for entrance;Assist for transportation;Assistance with cooking/housework;Two people to help with walking and/or transfers;A lot of help with bathing/dressing/bathroom;Two people to help with bathing/dressing/bathroom   Equipment Recommendations  None recommended by PT    Recommendations for Other Services       Precautions / Restrictions Precautions Precautions: Fall Precaution Comments: N/V, chemo precautions, PEG, fell 6/4 OOB Restrictions Weight Bearing Restrictions: No     Mobility  Bed Mobility Overal bed mobility: Needs Assistance   Rolling: Total assist, +2 for physical assistance   Supine to sit: Total assist, +2 for physical assistance, +2 for safety/equipment, HOB elevated Sit to supine: +2 for physical assistance, Total assist, +2 for safety/equipment   General bed mobility comments: pt profoundly weak requiring increased assist.  Pt unable to actively move B LE to EOB and pt unable to offer much self assist with UE's.  Once EOB, pr present with slumped/collapsed posture and required MAX Assist to prevent forward LOB.  Pt c/o dizziness.  BP 125/78 with increased HR 125.  Limited tolerance no more than 2 min.  Required Total Asisst  back to bed and position to comfort.    Transfers Overall transfer level: Needs assistance Equipment used: None Transfers: Sit to/from Stand Sit to Stand: Max assist, +2 physical assistance           General  transfer comment: pt required Max/Toatal Assist to perform partial sit to stand from elevated bed with Therapist "Bear Hug" frontal approach to block knees from buckling.  Pt was only ablew to offer 15% self assist and unable to support his his weight.  Immediately needing to sit back down and unable to complete 1/4 pivot to recliner.    Ambulation/Gait               General Gait Details: unable to tolerate this session due to profound weakness.   Stairs             Wheelchair Mobility    Modified Rankin (Stroke Patients Only)       Balance                                            Cognition Arousal/Alertness: Awake/alert Behavior During Therapy: Flat affect                                   General Comments: AxO visibly weary, required MAX encouragement to participate.  Profoundly weak.  B LE edema.        Exercises      General Comments        Pertinent Vitals/Pain Pain Assessment Pain Assessment: Faces Faces Pain Scale: Hurts whole lot Pain Location: ABD Pain Descriptors / Indicators: Grimacing, Discomfort, Moaning Pain Intervention(s): Monitored during session    Home Living                          Prior Function            PT Goals (current goals can now be found in the care plan section) Progress towards PT goals: Not progressing toward goals - comment    Frequency    Min 2X/week      PT Plan Discharge plan needs to be updated    Co-evaluation              AM-PAC PT "6 Clicks" Mobility   Outcome Measure  Help needed turning from your back to your side while in a flat bed without using bedrails?: Total Help needed moving from lying on your back to sitting on the side of a flat bed without using bedrails?: Total Help needed moving to and from a bed to a chair (including a wheelchair)?: Total Help needed standing up from a chair using your arms (e.g., wheelchair or bedside  chair)?: Total Help needed to walk in hospital room?: Total Help needed climbing 3-5 steps with a railing? : Total 6 Click Score: 6    End of Session Equipment Utilized During Treatment: Gait belt Activity Tolerance: Patient limited by fatigue Patient left: in bed;with chair alarm set;with bed alarm set Nurse Communication: Mobility status PT Visit Diagnosis: Unsteadiness on feet (R26.81);Muscle weakness (generalized) (M62.81);Difficulty in walking, not elsewhere classified (R26.2);Pain     Time: 6720-9470 PT Time Calculation (min) (ACUTE ONLY): 32 min  Charges:  $Therapeutic Activity: 23-37 mins  Rica Koyanagi  PTA Acute  Rehabilitation Services Pager      [phone] Office      [phone]

## 2022-05-20 NOTE — Consult Note (Signed)
Consultation Note Date: 05/20/2022   Patient Name: Zachary Bowers  DOB: 19-Apr-1956  MRN: [id number]  Age / Sex: 66 y.o., male  PCP: Shirline Frees, MD Referring Physician: Debbe Odea, MD  Reason for Consultation: Establishing goals of care  HPI/Patient Profile: 67 y.o. male  admitted on 05/09/2022   Clinical Assessment and Goals of Care: 66 year old patient who has gastric cancer, follows with Dr. Benay Spice from medical oncology, also has diabetes hypertension coronary artery disease.  Admitted with nausea vomiting transaminitis.  Admitted to hospital medicine service, oncology following.  Palliative medicine team consulted for broad goals of care discussions. Patient is resting in bed.  Wife and daughter present at the bedside.  Introduced myself and palliative care as follows: Palliative medicine is specialized medical care for people living with serious illness. It focuses on providing relief from the symptoms and stress of a serious illness. The goal is to improve quality of life for both the patient and the family. Goals of care: Broad aims of medical therapy in relation to the patient's values and preferences. Our aim is to provide medical care aimed at enabling patients to achieve the goals that matter most to them, given the circumstances of their particular medical situation and their constraints.   Goals wishes and values important to the patient and family as a unit attempted to be explored.  Discussed about how this current hospitalization is going as well as a his overall condition.  Discussed about his serious life limiting illness.  CODE STATUS discussions undertaken.  Things that are important to the patient attempted to be explored.  It is important for him to be home.  Patient's wife and family state that he has generalized weakness and has issues with ongoing restlessness and episodic  discomfort/pain.  We discussed about judicious use of opioids for managing his symptoms.  Additional discussions as documented below.  NEXT OF KIN Wife and daughter who are both present at the bedside at the time of my visit.  SUMMARY OF RECOMMENDATIONS   Full code/full scope care for now Patient and family considering various options.  We spent some time today talking about the patient's current condition and his underlying illnesses.  We compared and contrasted differences between hospice versus palliative approach.  We talked about home with home care, home PT/OT along with home-based palliative care and how that is different from home-based hospice care.  Opportunity given for asking questions which were answered to the best of my ability.  For now, monitor hospital course and overall disease trajectory of illness and to seek interdisciplinary input from oncology as well as hospital medicine.  Palliative medicine team to continue to follow. Thank you for the consult.  Code Status/Advance Care Planning: Full code   Symptom Management:    Palliative Prophylaxis:  Frequent Pain Assessment  Additional Recommendations (Limitations, Scope, Preferences): Full Scope Treatment  Psycho-social/Spiritual:  Desire for further Chaplaincy support:yes Additional Recommendations: Caregiving  Support/Resources  Prognosis:  Unable to determine  Discharge Planning: To Be Determined  Primary Diagnoses: Present on Admission:  Nausea & vomiting  Gastric cancer (HCC)  Transaminitis  Essential hypertension  Intractable nausea and vomiting   I have reviewed the medical record, interviewed the patient and family, and examined the patient. The following aspects are pertinent.  Past Medical History:  Diagnosis Date   Coronary artery disease    Diabetes mellitus without complication (HCC)    Hyperlipidemia    Hypertension    Hypogonadism in male    OSA (obstructive sleep apnea)     Stomach cancer (HCC)    Thyroid disease    Social History   Socioeconomic History   Marital status: Married    Spouse name: Not on file   Number of children: 2   Years of education: Not on file   Highest education level: Not on file  Occupational History   Occupation: Retired Works with Horses  Tobacco Use   Smoking status: Former   Smokeless tobacco: Former    Types: Camera operator and Sexual Activity   Alcohol use: No   Drug use: No   Sexual activity: Not on file  Other Topics Concern   Not on file  Social History Narrative   Not on file   Social Determinants of Health   Financial Resource Strain: Not on file  Food Insecurity: Not on file  Transportation Needs: Not on file  Physical Activity: Not on file  Stress: Not on file  Social Connections: Not on file   Family History  Problem Relation Age of Onset   Heart disease Mother    Alzheimer's disease Father    Stomach cancer Maternal Uncle    Scheduled Meds:  amLODipine  10 mg Per Tube Daily   aspirin  81 mg Per Tube Daily   Chlorhexidine Gluconate Cloth  6 each Topical Daily   enoxaparin (LOVENOX) injection  40 mg Subcutaneous Q24H   feeding supplement (PROSource TF)  45 mL Per Tube BID   feeding supplement (VITAL 1.5 CAL)  1,000 mL Per Tube Q24H   free water  200 mL Per Tube Q6H   insulin aspart  0-15 Units Subcutaneous Q4H   insulin glargine-yfgn  5 Units Subcutaneous QHS   lactulose  10 g Oral BID   levothyroxine  125 mcg Per Tube QAC breakfast   sodium chloride flush  10-40 mL Intracatheter Q12H   Continuous Infusions:  sodium chloride     PRN Meds:.sodium chloride, alteplase, alum & mag hydroxide-simeth, heparin lock flush, heparin lock flush, morphine injection, ondansetron **OR** ondansetron (ZOFRAN) IV, oxyCODONE, sodium chloride flush, sodium chloride flush, sodium chloride flush Medications Prior to Admission:  Prior to Admission medications   Medication Sig Start Date End Date Taking?  Authorizing Provider  acetaminophen (TYLENOL) 160 MG/5ML solution Take 20.3 mLs (650 mg total) by mouth every 6 (six) hours as needed for mild pain. Patient taking differently: Place 650 mg into feeding tube every 6 (six) hours as needed for mild pain. 02/20/22  Yes Maczis, Barth Kirks, PA-C  amLODipine (NORVASC) 10 MG tablet Place 10 mg into feeding tube daily. 07/06/21  Yes [provider]  aspirin 81 MG tablet Place 81 mg into feeding tube daily.   Yes [provider]  atorvastatin (LIPITOR) 80 MG tablet Take 1 tablet (80 mg total) by mouth daily. Patient taking differently: Place 80 mg into feeding tube daily. 08/11/21  Yes Reino Bellis B, NP  cyanocobalamin (,VITAMIN B-12,) 1000 MCG/ML injection Inject 1 mL (1,000 mcg total) into the muscle  every 30 (thirty) days. 03/20/22  Yes Maczis, Barth Kirks, PA-C  insulin detemir (LEVEMIR) 100 UNIT/ML FlexPen Inject 12 Units into the skin daily. Patient taking differently: Inject 12 Units into the skin daily. May take 6 units instead 02/20/22  Yes Maczis, Baltasar Twilley, PA-C  levothyroxine (SYNTHROID, LEVOTHROID) 125 MCG tablet Place 125 mcg into feeding tube daily before breakfast. 12/19/14  Yes [provider]  Nutritional Supplements (FEEDING SUPPLEMENT, PROSOURCE TF,) liquid Place 45 mLs into feeding tube 3 (three) times daily. 02/20/22  Yes Maczis, Barth Kirks, PA-C  telmisartan (MICARDIS) 40 MG tablet Place 40 mg into feeding tube daily. 07/06/21  Yes [provider]  blood glucose meter kit and supplies KIT Dispense based on patient and insurance preference. Use up to four times daily as directed. 02/20/22   Maczis, Barth Kirks, PA-C  glucose blood test strip 1 each by Other route. Check blood sugar every morning    [provider]  Insulin Pen Needle 32G X 4 MM MISC To be used for insulin administration 02/20/22   Maczis, Barth Kirks, PA-C  nitroGLYCERIN (NITROSTAT) 0.4 MG SL tablet Place 1 tablet (0.4 mg total) under the  tongue every 5 (five) minutes as needed for chest pain. Patient not taking: Reported on 05/01/2022 08/07/21 02/05/22  Skeet Latch, MD  oxyCODONE (ROXICODONE) 5 MG/5ML solution 5 mLs (5 mg total) by Per J Tube route every 6 (six) hours as needed for breakthrough pain. Patient not taking: Reported on 03/04/2022 02/20/22   Jillyn Ledger, PA-C   Allergies  Allergen Reactions   Colace [Docusate] Hives   Niaspan [Niacin Er] Itching   Review of Systems Complains of abdominal pain and back pain. Physical Exam Awake alert answers a few questions however does not verbalize freely Complains of generalized discomfort abdominal discomfort Low work of breathing Abdomen is distended Patient has left upper quadrant feeding tube  Vital Signs: BP 105/79 (BP Location: Left Arm)   Pulse (!) 118   Temp 98 F (36.7 C) (Oral)   Resp 18   Ht 6' (1.829 m)   Wt 92.9 kg   SpO2 96%   BMI 27.78 kg/m  Pain Scale: 0-10 POSS *See Group Information*: S-Acceptable,Sleep, easy to arouse Pain Score: 2    SpO2: SpO2: 96 % O2 Device:SpO2: 96 % O2 Flow Rate: .   IO: Intake/output summary:  Intake/Output Summary (Last 24 hours) at 05/20/2022 1548 Last data filed at 05/20/2022 0810 Gross per 24 hour  Intake 240 ml  Output 300 ml  Net -60 ml    LBM: Last BM Date : 05/17/22 Baseline Weight: Weight: 77.1 kg Most recent weight: Weight: 92.9 kg     Palliative Assessment/Data:   PPS 40%  Time In: 1500 Time Out:  1600 Time Total:  60 MIN.   Greater than 50%  of this time was spent counseling and coordinating care related to the above assessment and plan.  Signed by: [name], MD   Please contact Palliative Medicine Team phone at [phone] for questions and concerns.  For individual provider: See Shea Evans

## 2022-05-20 NOTE — Progress Notes (Addendum)
HEMATOLOGY-ONCOLOGY PROGRESS NOTE  ASSESSMENT AND PLAN: Gastric cancer CT abdomen/pelvis 10/08/2021-probable mass in the proximal stomach, enlarged gastropathic ligament node Upper endoscopy 10/01/2021-gastric cardia/fundus mass with oozing and stigmata of recent bleeding-biopsy at least intramucosal adenocarcinoma, mismatch repair protein expression intact, PD-L1 combined positive score 7%, HER2 positive PET 10/20/2021-hypermetabolic proximal gastric primary, or hypermetabolic gastropathic ligament node, right paramidline prostatic hypermetabolism, anal hypermetabolism without a CT mass Radiation 11/11/2021-12/22/2021 Cycle 1  Taxol/carboplatin 11/12/2021 Cycle 2 Taxol/carboplatin 11/19/2021 Cycle 3 Taxol/carboplatin 11/26/2021 Cycle 4 Taxol/carboplatin 12/03/2021 Cycle 5 Taxol/carboplatin 12/18/2021 PET 01/19/2022-decreased hypermetabolism at the gastric cardia, decreased size and metabolic activity associated with an enlarged gastrohepatic node, persistent anal hypermetabolism, stable lateral left lower lobe nodule, possible new more medial subpleural left lower lobe nodule-both nodules below PET resolution 02/11/2022-total gastrectomy with Roux-en-Y esophagojejunostomy, placement of jejunal feeding tube,ypT2,ypN1 moderate to poorly differentiated adenocarcinoma at the GE junction measuring 2.5 x 2.3 cm, lymphovascular invasion present, negative resection margins, 2/28 lymph nodes CT abdomen/pelvis with contrast 04/21/2022-innumerable hepatic metastases which are new, portacaval node suspicious for nodal metastasis. Cycle 1 FOLFOX/trastuzumab 05/07/2022   Microcytic anemia secondary to #1 Diabetes Hypertension Coronary artery disease, cardiac catheterization 08/11/2021-placement of proximal and distal right coronary artery stents Hypothyroidism Sleep apnea Anal hypermetabolism on the PET 10/20/2021-no mass identified Hospital admission 04/23/2022-nausea and vomiting, transaminitis.  Zachary Bowers is now  at day 14 following cycle 1 FOLFOX/trastuzumab.  He tolerated chemotherapy well.  Liver enzymes drawn 05/16/2022 remain elevated .  His encephalopathy seems to have improved today.  He still remains very weak.  Recommend for him to increase ambulation today.  He will not be a candidate for further chemotherapy unless his performance status improves.  We would recommend hospice if he is unable to participate in physical therapy and remains bedbound.  Therapy is working with the patient.  Anticipate discharge to SNF for short-term rehab.  Recommendations: 1.  Continue current pain medication. 2.  Diet as tolerated.  Continue J-tube feeding 3.  Physical therapy as tolerated 4.  Hospice referral if his performance status does not improve  Zachary Bussing, NP   Zachary Bowers was interviewed and examined.  He was more alert when I saw him this morning.  He remains bedbound.  I saw him again this afternoon.  His wife was present.  We discussed the prognosis.  She understands the recommendation is for hospice care if his performance status does not improve.  He will not be a candidate for further chemotherapy unless he is ambulatory.  Ms. Haran understands the poor prognosis and agrees to hospice care when indicated.  I was present for greater than 50% of today's visit.  I performed medical decision making.   SUBJECTIVE: More alert this morning.  Wife is at the bedside and reports that she notices less confusion.  He tends to be more confused when he is not having regular bowel movements.  Tolerating J-tube feeding well.  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[place])   10/28/2021 Cancer Staging   Staging form: Stomach, AJCC 8th Edition - Clinical: Stage Unknown (cTX, cN1, cM0) - Signed by Ladell Pier, MD on 10/28/2021   11/12/2021 - 12/18/2021 Chemotherapy   Patient is on Treatment Plan : ESOPHAGUS  Carboplatin/PACLitaxel weekly x 6 weeks with XRT       05/07/2022 -  Chemotherapy   Patient is on Treatment Plan : GASTROESOPHAGEAL FOLFOX D1,15 + Trastuzumab (6/4) D1,15 +  Pembrolizumab (200) every 3 weeks, q28d     Gastric cancer ([place])  02/11/2022 Initial Diagnosis   Gastric cancer ([place])   03/04/2022 Cancer Staging   Staging form: Stomach, AJCC 8th Edition - Pathologic: Stage IIA (pT2, pN1, cM0) - Signed by Ladell Pier, MD on 03/04/2022 Histologic grade (G): G2 Histologic grading system: 3 grade system Residual tumor (R): R0 - None   04/02/2022 -  Chemotherapy   Patient is on Treatment Plan : HEAD/NECK Nivolumab q14d       PHYSICAL EXAMINATION:  Vitals:   05/19/22 2100 05/20/22 0406  BP: 115/80 116/80  Pulse: (!) 124 (!) 122  Resp: 18 18  Temp: 97.8 F (36.6 C) 98 F (36.7 C)  SpO2: 95% 94%   Filed Weights   05/15/22 0653 05/17/22 0713 05/20/22 0406  Weight: 93.1 kg 91.7 kg 92.9 kg    Intake/Output from previous day: 06/07 0701 - 06/08 0700 In: 1944.9 [NG/GT:1944.9] Out: 300 [Urine:300]  Physical exam: HEENT-no thrush Lungs-clear anteriorly Cardiac-regular rate and rhythm, tachycardia Abdomen-the liver is palpable in the right upper abdomen, left upper quadrant feeding tube, mildly distended Vascular-no leg edema Neurologic: Lethargic-arousable, oriented to place, follows simple commands  LABORATORY DATA:  I have reviewed the data as listed    Latest Ref Rng & Units 05/19/2022   11:58 AM 05/17/2022    7:18 AM 05/12/2022    5:00 AM  CMP  Glucose 70 - 99 mg/dL 229  174  107   BUN 8 - 23 mg/dL 49  48  43   Creatinine 0.61 - 1.24 mg/dL 0.81  0.78  0.81   Sodium 135 - 145 mmol/L 143  145  141   Potassium 3.5 - 5.1 mmol/L 4.1  3.8  3.7   Chloride 98 - 111 mmol/L 110  111  108   CO2 22 - 32 mmol/L _0 Calcium 8.9 - 10.3 mg/dL 9.1  9.2  8.8   Total Protein 6.5 - 8.1 g/dL  6.1  5.9   Total Bilirubin 0.3 - 1.2 mg/dL  2.0  3.1   Alkaline Phos 38 - 126  U/L  606  658   AST 15 - 41 U/L  234  369   ALT 0 - 44 U/L  156  237     Lab Results  Component Value Date   WBC 8.1 05/17/2022   HGB 8.5 (L) 05/17/2022   HCT 26.4 (L) 05/17/2022   MCV 80.5 05/17/2022   PLT 206 05/17/2022   NEUTROABS 7.1 05/17/2022    Lab Results  Component Value Date   CEA 1.50 10/30/2021   PSA1 2.7 10/30/2021    CT HEAD WO CONTRAST (5MM)  Result Date: 05/17/2022 CLINICAL DATA:  Encephalopathy (Ped 0-17y). History of gastric cancer. EXAM: CT HEAD WITHOUT CONTRAST TECHNIQUE: Contiguous axial images were obtained from the base of the skull through the vertex without intravenous contrast. RADIATION DOSE REDUCTION: This exam was performed according to the departmental dose-optimization program which includes automated exposure control, adjustment of the mA and/or kV according to patient size and/or use of iterative reconstruction technique. COMPARISON:  None Available. FINDINGS: Brain: No evidence of acute infarction, hemorrhage, hydrocephalus, extra-axial collection or mass lesion/mass effect. Periventricular white matter hypodensities consistent with sequela of chronic microvascular ischemic disease. Vascular: Vascular calcifications of the carotid siphons. Skull: Normal. Negative for fracture or focal lesion. Sinuses/Orbits: No acute finding. Other: None. IMPRESSION: No acute intracranial abnormality. There is persistent concern for intracranial  metastases, recommend dedicated evaluation with brain MRI with and without contrast. Electronically Signed   By: [name] M.D.   On: 05/17/2022 12:17   DG Abd 1 View  Result Date: 05/16/2022 CLINICAL DATA:  Abdominal distension with tube feeds EXAM: ABDOMEN - 1 VIEW COMPARISON:  CT abdomen/pelvis 05/03/2022 FINDINGS: Jejunostomy tube present with the tip overlying the left mid abdomen. Large soft tissue density occupies the majority of the right abdomen and displaces the bowel gas pattern inferiorly and toward the left.  Correlation with prior CT imaging demonstrates marked hepatomegaly with extensive hepatic metastatic disease. The shadow on today's abdominal radiograph likely represents the enlarged liver with a small amount of perihepatic ascites. No evidence of bowel obstruction. IMPRESSION: Abdominal distension likely due to hepatomegaly with diffuse metastatic disease. No evidence of bowel obstruction. Electronically Signed   By: Jacqulynn Cadet M.D.   On: 05/16/2022 08:35   [place] EKG SITE RITE  Result Date: 05/06/2022 If Site Rite image not attached, placement could not be confirmed due to current cardiac rhythm.  CT ABDOMEN PELVIS W CONTRAST  Result Date: 05/10/2022 CLINICAL DATA:  Weakness, elevated LFTs, history of gastric cancer status post gastrectomy EXAM: CT ABDOMEN AND PELVIS WITH CONTRAST TECHNIQUE: Multidetector CT imaging of the abdomen and pelvis was performed using the standard protocol following bolus administration of intravenous contrast. RADIATION DOSE REDUCTION: This exam was performed according to the departmental dose-optimization program which includes automated exposure control, adjustment of the mA and/or kV according to patient size and/or use of iterative reconstruction technique. CONTRAST:  28m OMNIPAQUE IOHEXOL 300 MG/ML  SOLN COMPARISON:  PET-CT dated 01/18/2022 FINDINGS: Lower chest: Trace bilateral pleural effusions, right greater than left. Associated right lower lobe atelectasis. Hepatobiliary: Innumerable hepatic metastases in both lobes, new. Dominant aggregate lesion measures 8.8 cm in the lateral segment left hepatic lobe (series 2/image 25). Index lesion in the inferior right hepatic lobe measures 4.8 cm (series 2/image 50). Gallbladder is notable for layering sludge (series 2/image 52), without associated inflammatory changes. No intrahepatic or extrahepatic duct dilatation. Pancreas: Within normal limits. Spleen: Within normal limits. Adrenals/Urinary Tract: Adrenal glands are  within normal limits. Kidneys are within normal limits.  No hydronephrosis. Bladder is mildly thick-walled although underdistended. Stomach/Bowel: Status post gastrectomy. Percutaneous jejunostomy.  No evidence of bowel obstruction. Appendix is not discretely visualized, reportedly surgically absent. No colonic wall thickening or mass is evident on CT. Vascular/Lymphatic: No evidence of abdominal aortic aneurysm. 14 mm short axis node in the portacaval region (series 2/image 33). Reproductive: Prostate is unremarkable. Other: Moderate abdominopelvic ascites, minimally complex. Musculoskeletal: Mild degenerative changes of the lumbar spine. IMPRESSION: Status post gastrectomy with percutaneous jejunostomy. Innumerable hepatic metastases, new, with index lesions as above. 14 mm short axis portacaval node, suspicious for nodal metastasis. Moderate abdominopelvic ascites. Trace bilateral pleural effusions. Electronically Signed   By: SJulian HyM.D.   On: 04/24/2022 19:28     Future Appointments  Date Time Provider [place] 05/24/2022  8:15 AM [facility] PHLEBOTOMIST [facility] None  05/24/2022  8:30 AM [facility] FLUSH ROOM [facility] None  05/24/2022  8:45 AM TOwens Shark NP [facility] None  05/24/2022 10:00 AM [facility] CHAIR 1 [facility] None  05/26/2022  1:00 PM [facility] FLUSH ROOM [facility] None       LOS: 14 days

## 2022-05-20 NOTE — Plan of Care (Signed)
  Problem: Education: Goal: Knowledge of General Education information will improve Description Including pain rating scale, medication(s)/side effects and non-pharmacologic comfort measures Outcome: Progressing   

## 2022-05-20 NOTE — Progress Notes (Signed)
[facility] Progress Note  Patient: Zachary Bowers     EXH:[id number]  DOA: 05/02/2022   PCP: Shirline Frees, MD       Brief hospital course: This is a [age]-year-old male with gastric cancer status post total gastric resection with Roux-en-Y esophagojejunostomy and placement of J tube on 02/11/2022 who is receiving chemotherapy.  He also has a history of diabetes mellitus type 2, hypothyroidism, sleep apnea coronary artery disease with stents, essential hypertension.  He presented with nausea vomiting and abdominal pain which is felt to be due to metastatic disease (new mets to the liver) and recent chemotherapy. Spittle course has been complicated by acute metabolic encephalopathy and severe physical deconditioning to a point where the patient will require skilled nursing facility.    Chemotherapy has been put on hold to see if his performance status improves.  If no improvement, will need to consider comfort care.  Subjective:  He is more alert today but remains poorly communicative with me.  I had to ask the same question multiple times before he responded very slowly.  He has no complaints.  Assessment and Plan: Principal Problem:   Nausea & vomiting, intractable - No small bowel obstruction noted on CT scan and J-tube was noted to be appropriately situated - tolerating tube feeds at 55 cc an hour without vomiting -Also receiving a full liquid diet  Active Problems:    Gastric cancer (HCC) - New hepatic metastasis and transaminitis noted - Given FOLFOX and Herceptin on 04/17/2022 which he did not tolerate well - Continues to have a PICC line - Chemo on hold to see if deconditioning improves  Acute metabolic encephalopathy - Possibly hepatic encephalopathy - Has been started on lactulose as of 05/17/2022-  Diarrhea- numerous episodes 6/6-6/7 - stopped Lactulose, Reglan and Senna on 6/7-diarrhea resolved immediately - Due to suspicion for hepatic encephalopathy, I will re  order lactulose with a goal of 2-3 bowel movements a day  Diabetes mellitus type 2 -Current nutrition is via tube feeds and a full liquid diet - A1c 6.6 - Continue sliding scale insulin and 5 units of Lantus daily- avoid tight control of insulin  Hypertension - Continue amlodipine  Hypothyroidism - Continue Synthroid  Severe physical deconditioning Severe protein calorie malnutrition - In setting of chronic illness - Patient has energy intake < or equal to 75% for > or equal to 1 month, percent weight loss, moderate fat depletion, mild fat depletion, moderate muscle depletion -Continue Pro-Stat multivitamins and tube feeds -Declined to work with PT because he felt too weak -OT did not work with him  because of diarrhea -I have requested a palliative care consult as I feel that he needs hospice/palliative care at this point - he did not tolerate his previous chemotherapy well & he has become progressively more weak -If he is unable to perform with physical therapy, recommend palliative care    DVT prophylaxis:  enoxaparin (LOVENOX) injection 40 mg Start: 05/06/22 1000     Code Status: Full Code  Consultants: Oncology Level of Care: Level of care: Med-Surg Disposition Plan:  Status is: Inpatient Remains inpatient appropriate because: Will need SNF   Objective:   Vitals:   05/19/22 1510 05/19/22 2100 05/20/22 0406 05/20/22 1152  BP:  115/80 116/80 121/81  Pulse: 78 (!) 124 (!) 122 (!) 123  Resp: '16 18 18   '$ Temp:  97.8 F (36.6 C) 98 F (36.7 C)   TempSrc:  Oral Oral   SpO2: 92% 95% 94%  97%  Weight:   92.9 kg   Height:       Filed Weights   05/15/22 0653 05/17/22 0713 05/20/22 0406  Weight: 93.1 kg 91.7 kg 92.9 kg   Exam: General exam: Appears comfortable - sitting up in bed today trying to drink water HEENT: PERRLA, oral mucosa moist, no sclera icterus or thrush Respiratory system: Clear to auscultation. Respiratory effort normal. Cardiovascular system: S1 & S2  heard, regular rate and rhythm Gastrointestinal system: Abdomen soft, non-tender, nondistended. Normal bowel sounds  - J tube in place Central nervous system: Alert-difficult to tell if he is oriented as he will not answer questions for me I- unable to lift his legs Extremities: No cyanosis, clubbing or edema Skin: No rashes or ulcers Psychiatry: Flat affect   Imaging and lab data was personally reviewed    CBC: Recent Labs  Lab 05/14/22 0506 05/17/22 0739  WBC 10.0 8.1  NEUTROABS 8.9* 7.1  HGB 7.8* 8.5*  HCT 24.8* 26.4*  MCV 80.5 80.5  PLT 197 622    Basic Metabolic Panel: Recent Labs  Lab 05/17/22 0718 05/19/22 1158  NA 145 143  K 3.8 4.1  CL 111 110  CO2 25 23  GLUCOSE 174* 229*  BUN 48* 49*  CREATININE 0.78 0.81  CALCIUM 9.2 9.1    GFR: Estimated Creatinine Clearance: 98.5 mL/min (by C-G formula based on SCr of 0.81 mg/dL).  Scheduled Meds:  amLODipine  10 mg Per Tube Daily   aspirin  81 mg Per Tube Daily   Chlorhexidine Gluconate Cloth  6 each Topical Daily   enoxaparin (LOVENOX) injection  40 mg Subcutaneous Q24H   feeding supplement (PROSource TF)  45 mL Per Tube BID   feeding supplement (VITAL 1.5 CAL)  1,000 mL Per Tube Q24H   free water  200 mL Per Tube Q6H   insulin aspart  0-15 Units Subcutaneous Q4H   insulin glargine-yfgn  5 Units Subcutaneous QHS   levothyroxine  125 mcg Per Tube QAC breakfast   sodium chloride flush  10-40 mL Intracatheter Q12H   Continuous Infusions:  sodium chloride       LOS: 14 days   Author: Debbe Odea  05/20/2022 12:57 PM

## 2022-05-21 DIAGNOSIS — E119 Type 2 diabetes mellitus without complications: Secondary | ICD-10-CM | POA: Diagnosis not present

## 2022-05-21 DIAGNOSIS — R1084 Generalized abdominal pain: Secondary | ICD-10-CM | POA: Diagnosis not present

## 2022-05-21 DIAGNOSIS — R531 Weakness: Secondary | ICD-10-CM | POA: Diagnosis not present

## 2022-05-21 DIAGNOSIS — E43 Unspecified severe protein-calorie malnutrition: Secondary | ICD-10-CM | POA: Diagnosis not present

## 2022-05-21 DIAGNOSIS — K7682 Hepatic encephalopathy: Secondary | ICD-10-CM | POA: Diagnosis not present

## 2022-05-21 DIAGNOSIS — R112 Nausea with vomiting, unspecified: Secondary | ICD-10-CM | POA: Diagnosis not present

## 2022-05-21 DIAGNOSIS — Z515 Encounter for palliative care: Secondary | ICD-10-CM | POA: Diagnosis not present

## 2022-05-21 DIAGNOSIS — Z7189 Other specified counseling: Secondary | ICD-10-CM | POA: Diagnosis not present

## 2022-05-21 DIAGNOSIS — C160 Malignant neoplasm of cardia: Secondary | ICD-10-CM | POA: Diagnosis not present

## 2022-05-21 LAB — GLUCOSE, CAPILLARY
Glucose-Capillary: 195 mg/dL — ABNORMAL HIGH (ref 70–99)
Glucose-Capillary: 219 mg/dL — ABNORMAL HIGH (ref 70–99)
Glucose-Capillary: 220 mg/dL — ABNORMAL HIGH (ref 70–99)

## 2022-05-21 MED ORDER — MORPHINE SULFATE (PF) 2 MG/ML IV SOLN: 2.0000 mg | INTRAVENOUS | Status: DC | PRN

## 2022-05-21 MED ORDER — HALOPERIDOL LACTATE 5 MG/ML IJ SOLN: 0.5000 mg | INTRAMUSCULAR | Status: DC | PRN

## 2022-05-21 MED ORDER — MORPHINE 100MG IN NS 100ML (1MG/ML) PREMIX INFUSION
1.0000 mg/h | INTRAVENOUS | Status: DC
  Administered 2022-05-21: 1 mg/h via INTRAVENOUS
  Filled 2022-05-21: qty 100

## 2022-05-21 MED ORDER — ONDANSETRON 4 MG PO TBDP: 4.0000 mg | ORAL_TABLET | Freq: Four times a day (QID) | ORAL | Status: DC | PRN

## 2022-05-21 MED ORDER — ACETAMINOPHEN 325 MG PO TABS: 650.0000 mg | ORAL_TABLET | Freq: Four times a day (QID) | ORAL | Status: DC | PRN

## 2022-05-21 MED ORDER — GLYCOPYRROLATE 0.2 MG/ML IJ SOLN: 0.2000 mg | INTRAMUSCULAR | Status: DC | PRN

## 2022-05-21 MED ORDER — HALOPERIDOL 0.5 MG PO TABS: 0.5000 mg | ORAL_TABLET | ORAL | Status: DC | PRN

## 2022-05-21 MED ORDER — HALOPERIDOL LACTATE 2 MG/ML PO CONC: 0.5000 mg | ORAL | Status: DC | PRN

## 2022-05-21 MED ORDER — ACETAMINOPHEN 650 MG RE SUPP: 650.0000 mg | Freq: Four times a day (QID) | RECTAL | Status: DC | PRN

## 2022-05-21 MED ORDER — POLYVINYL ALCOHOL 1.4 % OP SOLN: 1.0000 [drp] | Freq: Four times a day (QID) | OPHTHALMIC | Status: DC | PRN

## 2022-05-21 MED ORDER — GLYCOPYRROLATE 1 MG PO TABS: 1.0000 mg | ORAL_TABLET | ORAL | Status: DC | PRN

## 2022-05-21 MED ORDER — ONDANSETRON HCL 4 MG/2ML IJ SOLN: 4.0000 mg | Freq: Four times a day (QID) | INTRAMUSCULAR | Status: DC | PRN

## 2022-05-21 MED ORDER — MORPHINE BOLUS VIA INFUSION: 2.0000 mg | INTRAVENOUS | Status: DC | PRN

## 2022-05-21 MED ORDER — BIOTENE DRY MOUTH MT LIQD: 15.0000 mL | OROMUCOSAL | Status: DC | PRN

## 2022-05-21 NOTE — Progress Notes (Signed)
General Surgery  Patient has continued to have a significant functional decline, with respiratory distress. He has been transitioned to comfort care and is on IV morphine. Tube feeds discontinued. I met with patient's wife at bedside this evening. Patient is sleeping comfortably. Please call surgery as needed if any further issues or questions.  Michaelle Birks, [place] General, Hepatobiliary and Pancreatic Surgery 05/21/22 6:27 PM

## 2022-05-21 NOTE — Progress Notes (Signed)
Daily Progress Note   Patient Name: Zachary Bowers       Date: 05/21/2022 DOB: [birth year]  Age: 66 y.o. MRN#: [id number] Attending Physician: Debbe Odea, MD Primary Care Physician: Shirline Frees, MD Admit Date: 04/18/2022  Reason for Consultation/Follow-up: Terminal Care  Subjective: Not awake not alert.  Now DNR and comfort care, family holding vigil at bedside, have already met with chaplain.  Patient now on morphine drip. Comfortable at the moment. See below.   Length of Stay: 15  Current Medications: Scheduled Meds:   sodium chloride flush  10-40 mL Intracatheter Q12H    Continuous Infusions:  sodium chloride     morphine 1 mg/hr (05/21/22 0853)    PRN Meds: sodium chloride, acetaminophen **OR** acetaminophen, alteplase, alum & mag hydroxide-simeth, antiseptic oral rinse, glycopyrrolate **OR** glycopyrrolate **OR** glycopyrrolate, haloperidol **OR** haloperidol **OR** haloperidol lactate, heparin lock flush, heparin lock flush, morphine, ondansetron **OR** ondansetron (ZOFRAN) IV, polyvinyl alcohol, sodium chloride flush, sodium chloride flush, sodium chloride flush  Physical Exam         Not awake not alert Shallow and coarse breath sounds S 1 S 2 Abdomen is distended Some edema  Vital Signs: BP 109/78 (BP Location: Left Arm)   Pulse (!) 127   Temp 97.8 F (36.6 C) (Oral)   Resp 18   Ht 6' (1.829 m)   Wt 90.9 kg   SpO2 98%   BMI 27.18 kg/m  SpO2: SpO2: 98 % O2 Device: O2 Device: Room Air O2 Flow Rate:    Intake/output summary:  Intake/Output Summary (Last 24 hours) at 05/21/2022 1100 Last data filed at 05/21/2022 0437 Gross per 24 hour  Intake --  Output 700 ml  Net -700 ml   LBM: Last BM Date : 05/17/22 Baseline Weight: Weight: 77.1 kg Most recent  weight: Weight: 90.9 kg       Palliative Assessment/Data:      Patient Active Problem List   Diagnosis Date Noted   Acute hepatic encephalopathy ([place]) 05/18/2022   Encounter for antineoplastic chemotherapy    Protein-calorie malnutrition, severe 05/07/2022   Intractable nausea and vomiting 05/06/2022   Nausea & vomiting 04/28/2022   Transaminitis 05/04/2022   DM2 (diabetes mellitus, type 2) ([place]) 04/23/2022   Malnutrition of moderate degree 02/17/2022   Gastric cancer ([place]) 02/11/2022   Malignant neoplasm of  cardia of stomach ([place]) 10/28/2021   Symptomatic anemia 10/08/2021   Gastric mass 10/08/2021   Chest pain 08/11/2021   Coronary artery disease due to lipid rich plaque 01/13/2015   Essential hypertension 01/13/2015   Hyperlipidemia 01/13/2015   Angina decubitus ([place]) 03/25/2014   Pure hypercholesterolemia 03/25/2014   Obesity 03/25/2014   Essential hypertension, benign 03/25/2014    Palliative Care Assessment & Plan   Patient Profile:    Assessment:  PPS 20% Actively dying End of life care Metastatic cancer.   Recommendations/Plan: Agree with Morphine drip Will add comfort care order set Anticipate hospital death End of life signs and symptoms discussed with family at bedside, provided support to wife and daughter.   Goals of Care and Additional Recommendations: Limitations on Scope of Treatment: Full Comfort Care  Code Status:    Code Status Orders  (From admission, onward)           Start     Ordered   05/21/22 1050  Do not attempt resuscitation (DNR)  Continuous       Question Answer Comment  In the event of cardiac or respiratory ARREST Do not call a "code blue"   In the event of cardiac or respiratory ARREST Do not perform Intubation, CPR, defibrillation or ACLS   In the event of cardiac or respiratory ARREST Use medication by any route, position, wound care, and other measures to relive pain and suffering. May use oxygen, suction and  manual treatment of airway obstruction as needed for comfort.      05/21/22 1050           Code Status History     Date Active Date Inactive Code Status Order ID Comments User Context   05/21/2022 0808 05/21/2022 1050 DNR [id number]  Maryanna Shape, NP Inpatient   04/30/2022 2245 05/21/2022 0808 Full Code [id number]  Etta Quill, DO Inpatient   02/11/2022 1532 02/20/2022 1855 Full Code [id number]  [name], MD Inpatient   10/08/2021 0736 10/11/2021 1557 Full Code [id number]  Neena Rhymes, MD ED   08/11/2021 1007 08/11/2021 2014 Full Code [id number]  Jettie Booze, MD Inpatient       Prognosis:  Hours - Days  Discharge Planning: Anticipated Hospital Death  Care plan was discussed with family and TR[facility] MD.   Thank you for allowing the Palliative Medicine Team to assist in the care of this patient.   Time In:  10 Time Out: 10.35 Total Time 35 Prolonged Time Billed No       Greater than 50%  of this time was spent counseling and coordinating care related to the above assessment and plan.  [name], MD  Please contact Palliative Medicine Team phone at [phone] for questions and concerns.

## 2022-05-21 NOTE — TOC Progression Note (Signed)
Transition of Care [facility]) - Progression Note    Patient Details  Name: Zachary Bowers MRN: [id number] Date of Birth: May 07, 1956  [facility] CM/SW Contact  Leeroy Cha, RN Phone Number: 05/21/2022, 9:26 AM  Clinical Narrative:    Chart reviewed following for toc needs.   Expected Discharge Plan: [place] Barriers to Discharge: Continued Medical Work up  Expected Discharge Plan and Services Expected Discharge Plan: [place] In-house Referral: Clinical Social Work   Post Acute Care Choice: Durable Medical Equipment, [place] arrangements for the past 2 months: Single Family Home                 DME Arranged: [place] DME Agency: [employer] Date DME Agency Contacted: 05/10/22 Time DME Agency Contacted: 1137 Representative spoke with at DME Agency: Andee Poles HH Arranged: PT, OT [place] Agency: [place] Date [place]: 05/11/22 Time [place]: [place] Representative spoke with at [place]: [place] ([place]) Interventions    Readmission Risk Interventions    05/10/2022   11:43 AM  Readmission Risk Prevention Plan  HRI or Home Care Consult Complete  Social Work Consult for [place] Planning/Counseling Complete  Palliative Care Screening Not Applicable

## 2022-05-21 NOTE — Care Management Important Message (Signed)
Important Message  Patient Details IM Letter placed in Patients room. Name: Zachary Bowers MRN: [id number] Date of Birth: [birth year]   Medicare Important Message Given:  Yes     Kerin Salen 05/21/2022, 10:50 AM

## 2022-05-21 NOTE — Progress Notes (Addendum)
HEMATOLOGY-ONCOLOGY PROGRESS NOTE  ASSESSMENT AND PLAN: Gastric cancer CT abdomen/pelvis 10/08/2021-probable mass in the proximal stomach, enlarged gastropathic ligament node Upper endoscopy 10/01/2021-gastric cardia/fundus mass with oozing and stigmata of recent bleeding-biopsy at least intramucosal adenocarcinoma, mismatch repair protein expression intact, PD-L1 combined positive score 7%, HER2 positive PET 10/20/2021-hypermetabolic proximal gastric primary, or hypermetabolic gastropathic ligament node, right paramidline prostatic hypermetabolism, anal hypermetabolism without a CT mass Radiation 11/11/2021-12/22/2021 Cycle 1  Taxol/carboplatin 11/12/2021 Cycle 2 Taxol/carboplatin 11/19/2021 Cycle 3 Taxol/carboplatin 11/26/2021 Cycle 4 Taxol/carboplatin 12/03/2021 Cycle 5 Taxol/carboplatin 12/18/2021 PET 01/19/2022-decreased hypermetabolism at the gastric cardia, decreased size and metabolic activity associated with an enlarged gastrohepatic node, persistent anal hypermetabolism, stable lateral left lower lobe nodule, possible new more medial subpleural left lower lobe nodule-both nodules below PET resolution 02/11/2022-total gastrectomy with Roux-en-Y esophagojejunostomy, placement of jejunal feeding tube,ypT2,ypN1 moderate to poorly differentiated adenocarcinoma at the GE junction measuring 2.5 x 2.3 cm, lymphovascular invasion present, negative resection margins, 2/28 lymph nodes CT abdomen/pelvis with contrast 04/22/2022-innumerable hepatic metastases which are new, portacaval node suspicious for nodal metastasis. Cycle 1 FOLFOX/trastuzumab 05/07/2022   Microcytic anemia secondary to #1 Diabetes Hypertension Coronary artery disease, cardiac catheterization 08/11/2021-placement of proximal and distal right coronary artery stents Hypothyroidism Sleep apnea Anal hypermetabolism on the PET 10/20/2021-no mass identified Hospital admission 04/22/2022-nausea and vomiting, transaminitis.  Zachary Bowers is now  day 15 following his first cycle of FOLFOX/trastuzumab.  His performance status has continued to decline.  This morning, he remains confused and is tachypneic.  Due to his poor performance status, he is not a candidate for additional systemic therapy.  We addressed goals of care with the patient and his family this morning and they would like to shift to a comfort focused approach.  They are agreeable to hospice if the patient is stable enough to leave the hospital.  Discussed ACLS and CPR with the patient and family and he agrees to DNR.  Recommendations: 1.  Shift to comfort based approach.  He would be appropriate for residential hospice if he is stable enough to discharge. 2.  Morphine drip per primary team. 3.  DNR/DNI 4.  Please call oncology as needed over the weekend, I will check on him 05/24/2022 if he remains in the hospital.  We will cancel outpatient follow-up appointments.  Zachary Bussing, NP  Zachary Bowers was interviewed and examined.  His daughter was at the bedside when I saw him this morning.  His clinical status has further declined.  He remains bedbound.  He is tachypneic this morning.  I discussed the poor prognosis with his wife yesterday afternoon and with his daughter this morning.  They understand he is not a candidate for further chemotherapy.  They agree to hospice care.  I discussed ACLS and CPR with Zachary Bowers and his daughter.  He will be placed on a no CODE BLUE status.  He appears to be a candidate for transfer to [facility].  I will plan to follow him with the hospice team.  I was present for greater than 50% of today's visit.  I performed medical decision making.  SUBJECTIVE: was more confused this morning. Had tachypnea. Has agreed to comfort measures and is on a morphine drip.   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[place])   10/28/2021 Cancer Staging    Staging form: Stomach, AJCC 8th Edition - Clinical: Stage Unknown (cTX, cN1, cM0) - Signed by Betsy Coder  B, MD on 10/28/2021   11/12/2021 - 12/18/2021 Chemotherapy   Patient is on Treatment Plan : ESOPHAGUS Carboplatin/PACLitaxel weekly x 6 weeks with XRT       05/07/2022 -  Chemotherapy   Patient is on Treatment Plan : GASTROESOPHAGEAL FOLFOX D1,15 + Trastuzumab (6/4) D1,15 + Pembrolizumab (200) every 3 weeks, q28d     Gastric cancer ([place])  02/11/2022 Initial Diagnosis   Gastric cancer ([place])   03/04/2022 Cancer Staging   Staging form: Stomach, AJCC 8th Edition - Pathologic: Stage IIA (pT2, pN1, cM0) - Signed by Ladell Pier, MD on 03/04/2022 Histologic grade (G): G2 Histologic grading system: 3 grade system Residual tumor (R): R0 - None   04/02/2022 -  Chemotherapy   Patient is on Treatment Plan : HEAD/NECK Nivolumab q14d       PHYSICAL EXAMINATION:  Vitals:   05/20/22 2127 05/21/22 0410  BP: 115/83 109/78  Pulse: (!) 123 (!) 127  Resp: 18 18  Temp: 98.5 F (36.9 C) 97.8 F (36.6 C)  SpO2: 97% 98%   Filed Weights   05/17/22 0713 05/20/22 0406 05/21/22 0410  Weight: 91.7 kg 92.9 kg 90.9 kg    Intake/Output from previous day: 06/08 0701 - 06/09 0700 In: 240 [P.O.:240] Out: 700 [Urine:700]  Physical exam: HEENT-no thrush Lungs-moves air bilaterally, increased respiratory rate Cardiac-regular rate and rhythm, tachycardia Abdomen-the liver is palpable in the right upper abdomen, left upper quadrant feeding tube, mildly distended Vascular-no leg edema Neurologic: Lethargic-arousable, oriented to place, follows simple commands  LABORATORY DATA:  I have reviewed the data as listed    Latest Ref Rng & Units 05/20/2022    8:20 PM 05/19/2022   11:58 AM 05/17/2022    7:18 AM  CMP  Glucose 70 - 99 mg/dL 202  229  174   BUN 8 - 23 mg/dL 42  49  48   Creatinine 0.61 - 1.24 mg/dL 0.81  0.81  0.78   Sodium 135 - 145 mmol/L 146  143  145   Potassium 3.5 - 5.1 mmol/L 4.5   4.1  3.8   Chloride 98 - 111 mmol/L 113  110  111   CO2 22 - 32 mmol/L _0 Calcium 8.9 - 10.3 mg/dL 9.2  9.1  9.2   Total Protein 6.5 - 8.1 g/dL   6.1   Total Bilirubin 0.3 - 1.2 mg/dL   2.0   Alkaline Phos 38 - 126 U/L   606   AST 15 - 41 U/L   234   ALT 0 - 44 U/L   156     Lab Results  Component Value Date   WBC 8.1 05/17/2022   HGB 8.5 (L) 05/17/2022   HCT 26.4 (L) 05/17/2022   MCV 80.5 05/17/2022   PLT 206 05/17/2022   NEUTROABS 7.1 05/17/2022    Lab Results  Component Value Date   CEA 1.50 10/30/2021   PSA1 2.7 10/30/2021    CT HEAD WO CONTRAST (5MM)  Result Date: 05/17/2022 CLINICAL DATA:  Encephalopathy (Ped 0-17y). History of gastric cancer. EXAM: CT HEAD WITHOUT CONTRAST TECHNIQUE: Contiguous axial images were obtained from the base of the skull through the vertex without intravenous contrast. RADIATION DOSE REDUCTION: This exam was performed according to the departmental dose-optimization program which includes automated exposure control, adjustment of the mA and/or kV according to patient size and/or use of iterative reconstruction technique. COMPARISON:  None Available. FINDINGS: Brain: No evidence of acute infarction, hemorrhage,  hydrocephalus, extra-axial collection or mass lesion/mass effect. Periventricular white matter hypodensities consistent with sequela of chronic microvascular ischemic disease. Vascular: Vascular calcifications of the carotid siphons. Skull: Normal. Negative for fracture or focal lesion. Sinuses/Orbits: No acute finding. Other: None. IMPRESSION: No acute intracranial abnormality. There is persistent concern for intracranial metastases, recommend dedicated evaluation with brain MRI with and without contrast. Electronically Signed   By: [name] M.D.   On: 05/17/2022 12:17   DG Abd 1 View  Result Date: 05/16/2022 CLINICAL DATA:  Abdominal distension with tube feeds EXAM: ABDOMEN - 1 VIEW COMPARISON:  CT abdomen/pelvis 05/04/2022  FINDINGS: Jejunostomy tube present with the tip overlying the left mid abdomen. Large soft tissue density occupies the majority of the right abdomen and displaces the bowel gas pattern inferiorly and toward the left. Correlation with prior CT imaging demonstrates marked hepatomegaly with extensive hepatic metastatic disease. The shadow on today's abdominal radiograph likely represents the enlarged liver with a small amount of perihepatic ascites. No evidence of bowel obstruction. IMPRESSION: Abdominal distension likely due to hepatomegaly with diffuse metastatic disease. No evidence of bowel obstruction. Electronically Signed   By: Jacqulynn Cadet M.D.   On: 05/16/2022 08:35   [place] EKG SITE RITE  Result Date: 05/06/2022 If Site Rite image not attached, placement could not be confirmed due to current cardiac rhythm.  CT ABDOMEN PELVIS W CONTRAST  Result Date: 04/15/2022 CLINICAL DATA:  Weakness, elevated LFTs, history of gastric cancer status post gastrectomy EXAM: CT ABDOMEN AND PELVIS WITH CONTRAST TECHNIQUE: Multidetector CT imaging of the abdomen and pelvis was performed using the standard protocol following bolus administration of intravenous contrast. RADIATION DOSE REDUCTION: This exam was performed according to the departmental dose-optimization program which includes automated exposure control, adjustment of the mA and/or kV according to patient size and/or use of iterative reconstruction technique. CONTRAST:  62m OMNIPAQUE IOHEXOL 300 MG/ML  SOLN COMPARISON:  PET-CT dated 01/18/2022 FINDINGS: Lower chest: Trace bilateral pleural effusions, right greater than left. Associated right lower lobe atelectasis. Hepatobiliary: Innumerable hepatic metastases in both lobes, new. Dominant aggregate lesion measures 8.8 cm in the lateral segment left hepatic lobe (series 2/image 25). Index lesion in the inferior right hepatic lobe measures 4.8 cm (series 2/image 50). Gallbladder is notable for layering sludge  (series 2/image 52), without associated inflammatory changes. No intrahepatic or extrahepatic duct dilatation. Pancreas: Within normal limits. Spleen: Within normal limits. Adrenals/Urinary Tract: Adrenal glands are within normal limits. Kidneys are within normal limits.  No hydronephrosis. Bladder is mildly thick-walled although underdistended. Stomach/Bowel: Status post gastrectomy. Percutaneous jejunostomy.  No evidence of bowel obstruction. Appendix is not discretely visualized, reportedly surgically absent. No colonic wall thickening or mass is evident on CT. Vascular/Lymphatic: No evidence of abdominal aortic aneurysm. 14 mm short axis node in the portacaval region (series 2/image 33). Reproductive: Prostate is unremarkable. Other: Moderate abdominopelvic ascites, minimally complex. Musculoskeletal: Mild degenerative changes of the lumbar spine. IMPRESSION: Status post gastrectomy with percutaneous jejunostomy. Innumerable hepatic metastases, new, with index lesions as above. 14 mm short axis portacaval node, suspicious for nodal metastasis. Moderate abdominopelvic ascites. Trace bilateral pleural effusions. Electronically Signed   By: SJulian HyM.D.   On: 05/11/2022 19:28     Future Appointments  Date Time Provider DBelford 05/24/2022  8:15 AM [facility] PHLEBOTOMIST [facility] None  05/24/2022  8:30 AM [facility] FLUSH ROOM [facility] None  05/24/2022  8:45 AM TOwens Shark NP [facility] None  05/24/2022 10:00 AM [facility] CHAIR 1 [facility] None  05/26/2022  1:00 PM [facility] FLUSH ROOM [facility] None       LOS: 15 days

## 2022-05-21 NOTE — Progress Notes (Signed)
Examined patient and spoke with daughter. He is breathing rapidly and minimally responsive. He has received IV Morphine. Spoke with daughter about transitioning to comfort care now, stopping tube feeds, increasing Morphing to make him more comfortable. Expect that he will likely pass in the hospital and she understands this. Discussed plan with Dr Benay Spice who is on board with comfort care measures.

## 2022-05-21 NOTE — Progress Notes (Signed)
  [facility] Progress Note  Patient: Zachary Bowers     EGB:[id number]  DOA: 04/26/2022   PCP: Shirline Frees, MD       Brief hospital course: This is a [age]-year-old male with gastric cancer status post total gastric resection with Roux-en-Y esophagojejunostomy and placement of J tube on 02/11/2022 who is receiving chemotherapy.  He also has a history of diabetes mellitus type 2, hypothyroidism, sleep apnea coronary artery disease with stents, essential hypertension.  He presented with nausea vomiting and abdominal pain which is felt to be due to metastatic disease (new mets to the liver) and recent chemotherapy. Spittle course has been complicated by acute metabolic encephalopathy and severe physical deconditioning to a point where the patient will require skilled nursing facility.    Chemotherapy has been put on hold to see if his performance status improves.   Increasing respiratory distress with increasing lethargy. He is being placed on a morphine infusion today.  Tube feeds have been discontinued.  Discussed plan with daughter who is in the room.  She is in agreement and states that she has been telling him he needs to let go.  Subjective:  Lethargic  Assessment and Plan: Principal Problem:   Nausea & vomiting, intractable -Active Problems:    Gastric cancer ([place]) - New hepatic metastasis and transaminitis noted  Acute metabolic encephalopathy  Diarrhea- numerous episodes 6/6-6/7  Diabetes mellitus type 2  Hypertension  Hypothyroidism   Severe physical deconditioning Severe protein calorie malnutrition    DVT prophylaxis:    None     Code Status: DNR  Consultants: Oncology Level of Care: Level of care: Med-Surg Disposition Plan:  Status is: Inpatient Remains inpatient appropriate because: Will need SNF   Objective:   Vitals:   05/20/22 1152 05/20/22 1438 05/20/22 2127 05/21/22 0410  BP: 121/81 105/79 115/83 109/78  Pulse: (!) 123 (!) 118 (!) 123  (!) 127  Resp:   18 18  Temp:   98.5 F (36.9 C) 97.8 F (36.6 C)  TempSrc:   Oral Oral  SpO2: 97% 96% 97% 98%  Weight:    90.9 kg  Height:       Filed Weights   05/17/22 0713 05/20/22 0406 05/21/22 0410  Weight: 91.7 kg 92.9 kg 90.9 kg   Exam: Rapid shallow breathing, lethargic   Imaging and lab data was personally reviewed    CBC: Recent Labs  Lab 05/17/22 0739  WBC 8.1  NEUTROABS 7.1  HGB 8.5*  HCT 26.4*  MCV 80.5  PLT 371    Basic Metabolic Panel: Recent Labs  Lab 05/17/22 0718 05/19/22 1158 05/20/22 2020  NA 145 143 146*  K 3.8 4.1 4.5  CL 111 110 113*  CO2 '25 23 24  '$ GLUCOSE 174* 229* 202*  BUN 48* 49* 42*  CREATININE 0.78 0.81 0.81  CALCIUM 9.2 9.1 9.2    GFR: Estimated Creatinine Clearance: 98.5 mL/min (by C-G formula based on SCr of 0.81 mg/dL).  Scheduled Meds:  sodium chloride flush  10-40 mL Intracatheter Q12H   Continuous Infusions:  sodium chloride     morphine 1 mg/hr (05/21/22 0853)     LOS: 15 days   Author: Debbe Odea  05/21/2022 2:05 PM

## 2022-05-22 DIAGNOSIS — R7989 Other specified abnormal findings of blood chemistry: Secondary | ICD-10-CM | POA: Diagnosis not present

## 2022-05-22 DIAGNOSIS — R112 Nausea with vomiting, unspecified: Secondary | ICD-10-CM | POA: Diagnosis not present

## 2022-05-22 DIAGNOSIS — I10 Essential (primary) hypertension: Secondary | ICD-10-CM | POA: Diagnosis not present

## 2022-05-22 DIAGNOSIS — C160 Malignant neoplasm of cardia: Secondary | ICD-10-CM | POA: Diagnosis not present

## 2022-05-24 ENCOUNTER — Inpatient Hospital Stay: Payer: Medicare PPO

## 2022-05-24 ENCOUNTER — Inpatient Hospital Stay: Payer: Medicare PPO | Admitting: Nurse Practitioner

## 2022-05-26 ENCOUNTER — Inpatient Hospital Stay: Payer: Medicare PPO

## 2022-06-02 DIAGNOSIS — G4733 Obstructive sleep apnea (adult) (pediatric): Secondary | ICD-10-CM

## 2022-06-02 DIAGNOSIS — E291 Testicular hypofunction: Secondary | ICD-10-CM

## 2022-06-02 DIAGNOSIS — E079 Disorder of thyroid, unspecified: Secondary | ICD-10-CM

## 2022-06-02 DIAGNOSIS — C787 Secondary malignant neoplasm of liver and intrahepatic bile duct: Secondary | ICD-10-CM

## 2022-06-12 NOTE — Death Summary Note (Signed)
DEATH SUMMARY   Patient Details  Name: Zachary Bowers MRN: [id number] DOB: 05-24-1956 WUJ:[id number], Zachary Saxon, MD Admission/Discharge Information   Admit Date:  2022-06-01  Date of Death: Date of Death: June 18, 2022  Time of Death: Time of Death: 0252  Length of Stay: 03/25/23   Principle Cause of death: gastric cancer  Hospital Diagnoses: Principal Problem:   Intractable nausea and vomiting Active Problems:   Gastric cancer ([place])   Transaminitis   Metastatic cancer to liver ([place])   DM2 (diabetes mellitus, type 2) ([place])   Essential hypertension, benign   Coronary artery disease due to lipid rich plaque   Hyperlipidemia   Protein-calorie malnutrition, severe   Thyroid disease   OSA (obstructive sleep apnea)   Hypogonadism in male   Hospital Course: This is a [age]-year-old male with gastric cancer status post total gastric resection with Roux-en-Y esophagojejunostomy and placement of J tube on 02/11/2022 who is receiving chemotherapy.  He also has a history of diabetes mellitus type 2, hypothyroidism, sleep apnea coronary artery disease with stents, essential hypertension.    He presented with nausea vomiting and abdominal pain which is felt to be due to metastatic disease (new mets to the liver) and recent chemotherapy. Hospital course been complicated by acute metabolic encephalopathy and severe physical deconditioning to a point where the patient was recommended a skilled nursing facility.   Chemotherapy was put on hold to see if his performance status improves. Oncology, Dr Benay Spice followed the patient in the hospital.  Dr Benay Spice and our palliative care team assisted in his care and discussions with family as well. On 6/9, he was noted to have increasing respiratory distress with increasing lethargy. Due to respiratory distress he was placed on a morphine infusion and tube feeds were discontinued. I dicussed this plan face to face with the patient's daughter with is his POA. He expired  on 2023-06-19.      The results of significant diagnostics from this hospitalization (including imaging, microbiology, ancillary and laboratory) are listed below for reference.   Significant Diagnostic Studies: CT HEAD WO CONTRAST (5MM)  Result Date: 05/17/2022 CLINICAL DATA:  Encephalopathy (Ped 0-17y). History of gastric cancer. EXAM: CT HEAD WITHOUT CONTRAST TECHNIQUE: Contiguous axial images were obtained from the base of the skull through the vertex without intravenous contrast. RADIATION DOSE REDUCTION: This exam was performed according to the departmental dose-optimization program which includes automated exposure control, adjustment of the mA and/or kV according to patient size and/or use of iterative reconstruction technique. COMPARISON:  None Available. FINDINGS: Brain: No evidence of acute infarction, hemorrhage, hydrocephalus, extra-axial collection or mass lesion/mass effect. Periventricular white matter hypodensities consistent with sequela of chronic microvascular ischemic disease. Vascular: Vascular calcifications of the carotid siphons. Skull: Normal. Negative for fracture or focal lesion. Sinuses/Orbits: No acute finding. Other: None. IMPRESSION: No acute intracranial abnormality. There is persistent concern for intracranial metastases, recommend dedicated evaluation with brain MRI with and without contrast. Electronically Signed   By: Valentino Bowers M.D.   On: 05/17/2022 12:17   DG Abd 1 View  Result Date: 05/16/2022 CLINICAL DATA:  Abdominal distension with tube feeds EXAM: ABDOMEN - 1 VIEW COMPARISON:  CT abdomen/pelvis 2022/06/01 FINDINGS: Jejunostomy tube present with the tip overlying the left mid abdomen. Large soft tissue density occupies the majority of the right abdomen and displaces the bowel gas pattern inferiorly and toward the left. Correlation with prior CT imaging demonstrates marked hepatomegaly with extensive hepatic metastatic disease. The shadow on today's abdominal  radiograph likely  represents the enlarged liver with a small amount of perihepatic ascites. No evidence of bowel obstruction. IMPRESSION: Abdominal distension likely due to hepatomegaly with diffuse metastatic disease. No evidence of bowel obstruction. Electronically Signed   By: Jacqulynn Cadet M.D.   On: 05/16/2022 08:35   [place] EKG SITE RITE  Result Date: 05/06/2022 If Site Rite image not attached, placement could not be confirmed due to current cardiac rhythm.  CT ABDOMEN PELVIS W CONTRAST  Result Date: 05/07/2022 CLINICAL DATA:  Weakness, elevated LFTs, history of gastric cancer status post gastrectomy EXAM: CT ABDOMEN AND PELVIS WITH CONTRAST TECHNIQUE: Multidetector CT imaging of the abdomen and pelvis was performed using the standard protocol following bolus administration of intravenous contrast. RADIATION DOSE REDUCTION: This exam was performed according to the departmental dose-optimization program which includes automated exposure control, adjustment of the mA and/or kV according to patient size and/or use of iterative reconstruction technique. CONTRAST:  21m OMNIPAQUE IOHEXOL 300 MG/ML  SOLN COMPARISON:  PET-CT dated 01/18/2022 FINDINGS: Lower chest: Trace bilateral pleural effusions, right greater than left. Associated right lower lobe atelectasis. Hepatobiliary: Innumerable hepatic metastases in both lobes, new. Dominant aggregate lesion measures 8.8 cm in the lateral segment left hepatic lobe (series 2/image 25). Index lesion in the inferior right hepatic lobe measures 4.8 cm (series 2/image 50). Gallbladder is notable for layering sludge (series 2/image 52), without associated inflammatory changes. No intrahepatic or extrahepatic duct dilatation. Pancreas: Within normal limits. Spleen: Within normal limits. Adrenals/Urinary Tract: Adrenal glands are within normal limits. Kidneys are within normal limits.  No hydronephrosis. Bladder is mildly thick-walled although underdistended.  Stomach/Bowel: Status post gastrectomy. Percutaneous jejunostomy.  No evidence of bowel obstruction. Appendix is not discretely visualized, reportedly surgically absent. No colonic wall thickening or mass is evident on CT. Vascular/Lymphatic: No evidence of abdominal aortic aneurysm. 14 mm short axis node in the portacaval region (series 2/image 33). Reproductive: Prostate is unremarkable. Other: Moderate abdominopelvic ascites, minimally complex. Musculoskeletal: Mild degenerative changes of the lumbar spine. IMPRESSION: Status post gastrectomy with percutaneous jejunostomy. Innumerable hepatic metastases, new, with index lesions as above. 14 mm short axis portacaval node, suspicious for nodal metastasis. Moderate abdominopelvic ascites. Trace bilateral pleural effusions. Electronically Signed   By: SJulian HyM.D.   On: 04/27/2022 19:28      Time spent: 35 minutes  Signed: SDebbe Odea MD

## 2022-06-12 NOTE — Progress Notes (Signed)
Pt expired at [facility] on 25-May-2022. No respirations noted. Caroid pulse absent. Death confirmed by Roland Rack, RN. Wife, Arbie Cookey at bedside at time of death. [facility] donor service notified. All of representative's questions answered to his satisfaction. Eye prep completed per representative's request. All belongings taken away by wife, Arbie Cookey.

## 2022-06-12 DEATH — deceased

## 2023-02-04 IMAGING — CR DG CHEST 1V
1 series · 1 of 1 positions shown · non-contrast
Comparison: Chest radiograph, 01/26/2017.

CLINICAL DATA: Chest pain.

EXAM:
CHEST  1 VIEW

[chest ap]
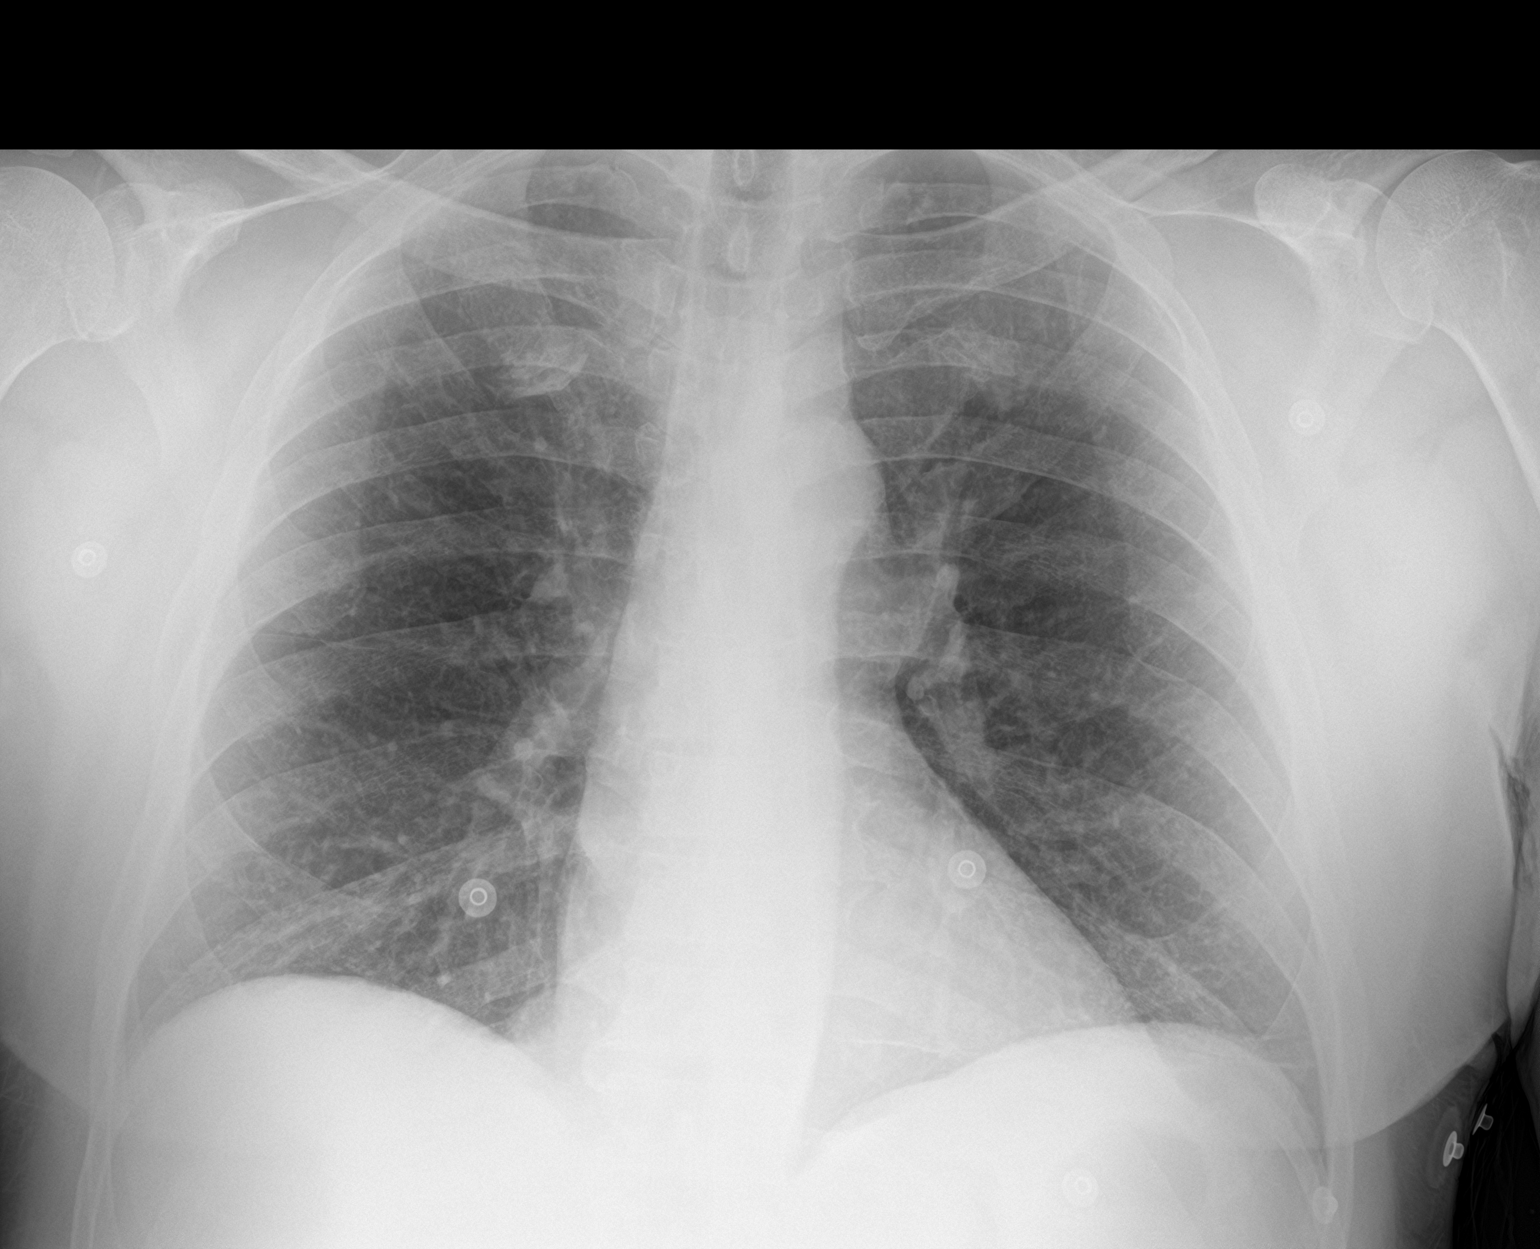

[1 of 1 positions shown; findings below may reference images not displayed]

FINDINGS: Cardiomediastinal size within normal limits. Normal lung inflation.
No focal consolidation or mass. No pleural effusion or pneumothorax.
No acute osseous abnormality.
IMPRESSION: Normal chest.

## 2023-03-05 IMAGING — US US ABDOMEN LIMITED
1 series · 14 of 25 positions shown · non-contrast
Comparison: None.

CLINICAL DATA: POSITIVE KLEVER /UPPER RIGHT SIDE QUADRANT PAIN

EXAM:
ULTRASOUND ABDOMEN LIMITED RIGHT UPPER QUADRANT

[Series 1: us abdomen limited ruq (liver/gb) · 14 of 56 slices shown]
[im 1/56]
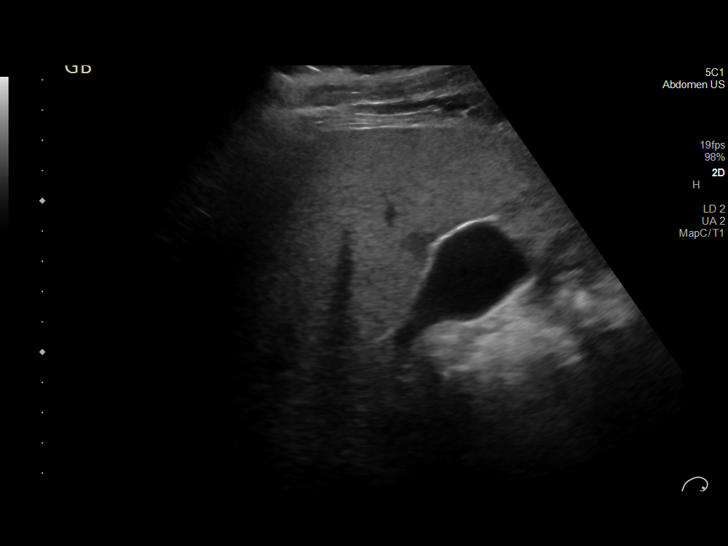
[im 5/56]
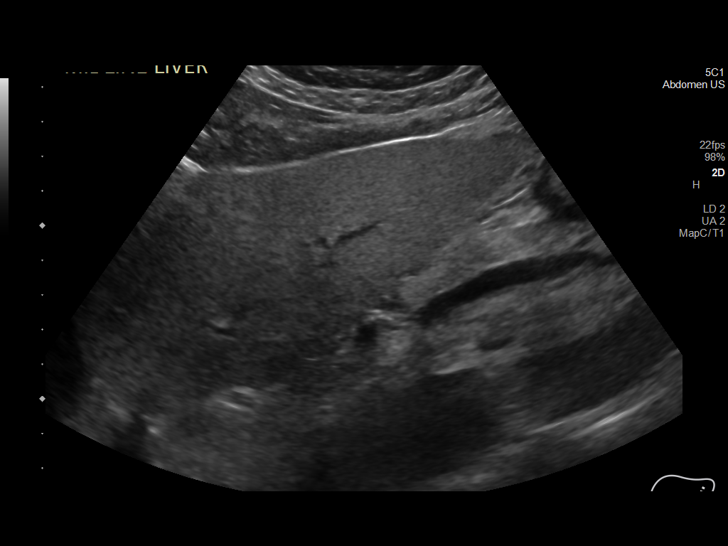
[im 10/56]
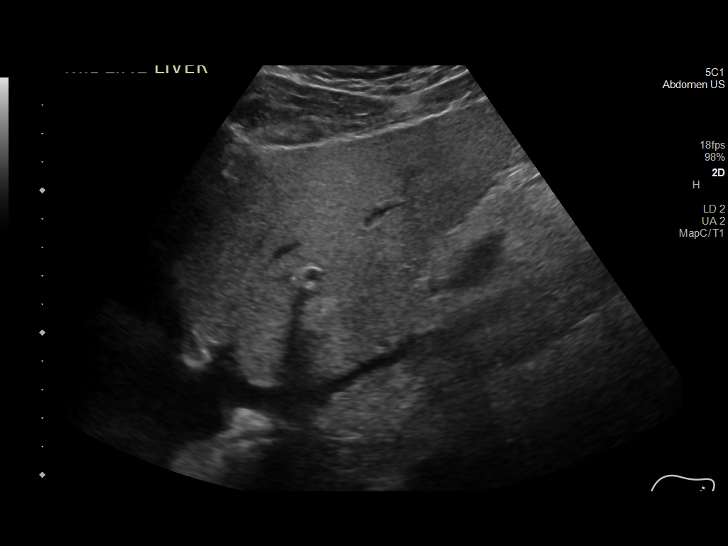
[im 14/56]
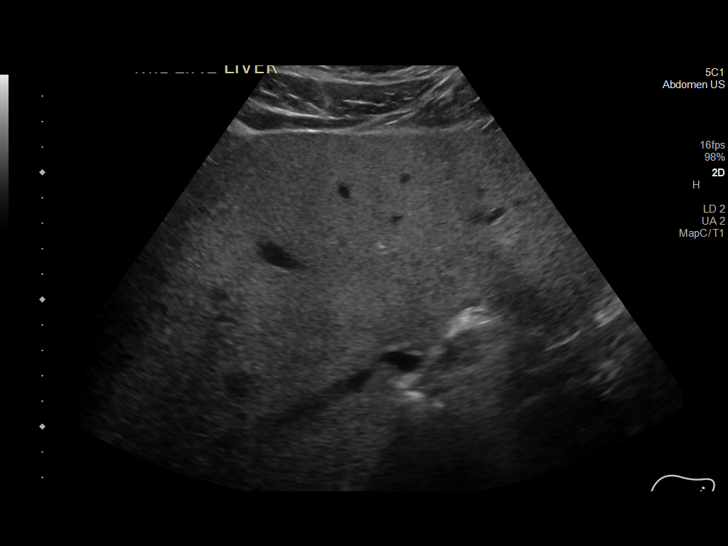
[im 19/56]
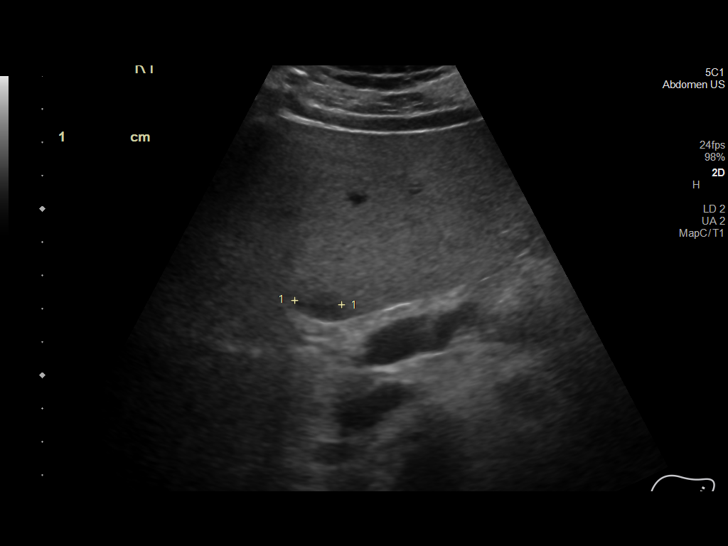
[im 21/56]
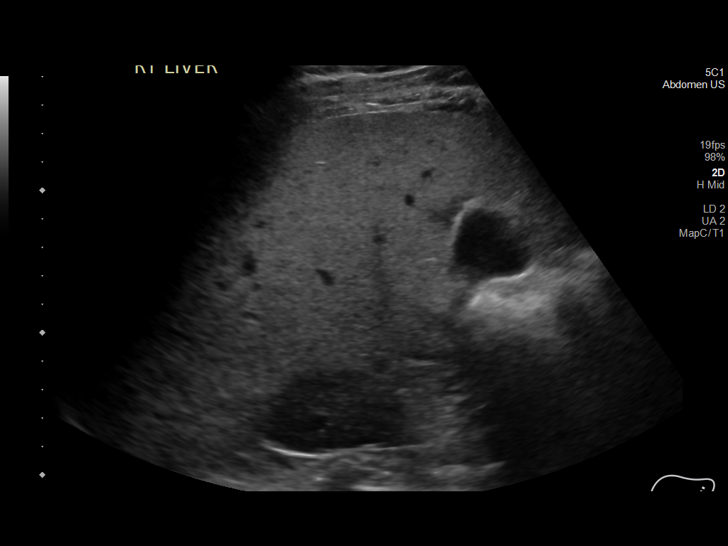
[im 26/56]
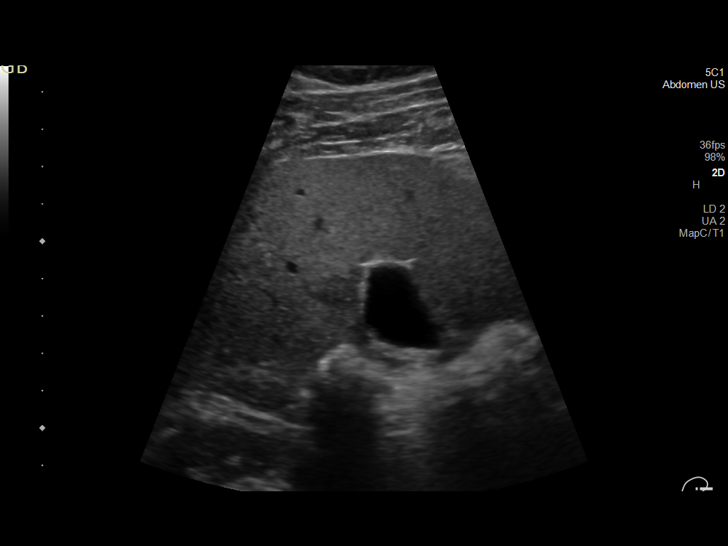
[im 30/56]
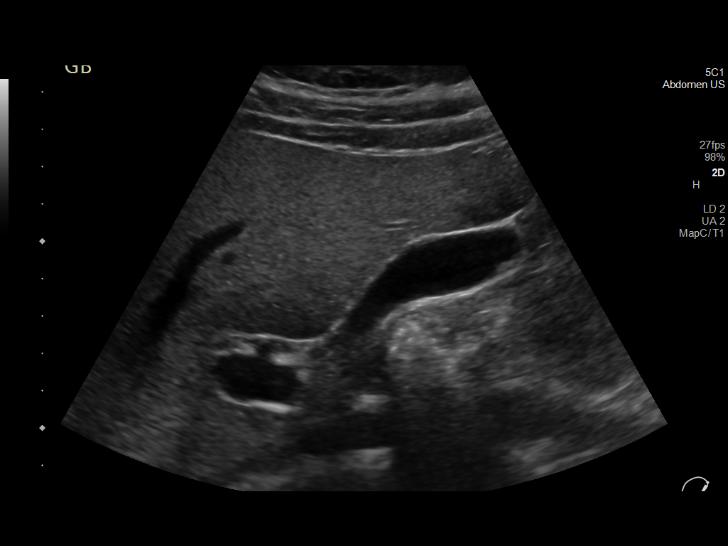
[im 35/56]
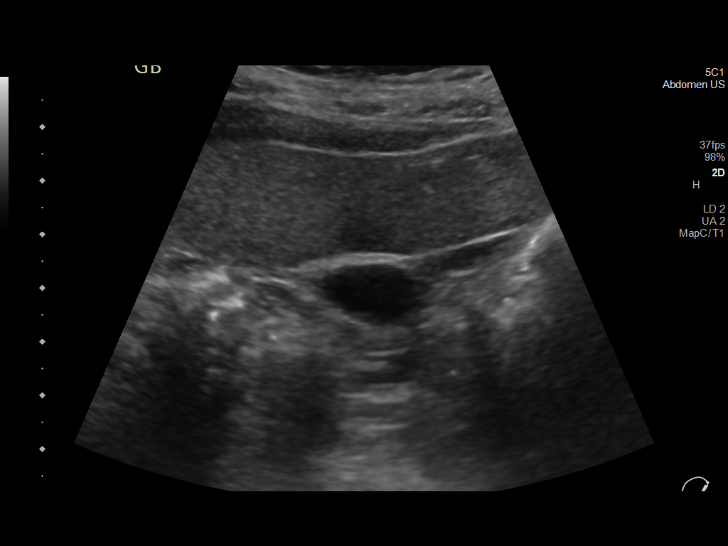
[im 37/56]
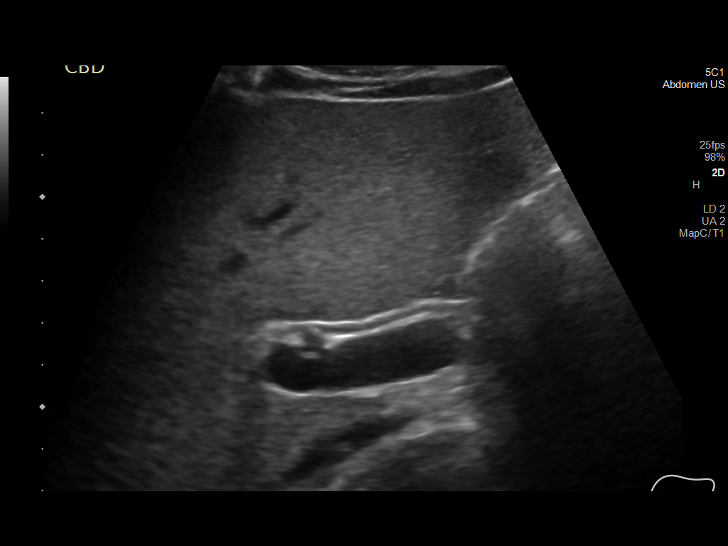
[im 42/56]
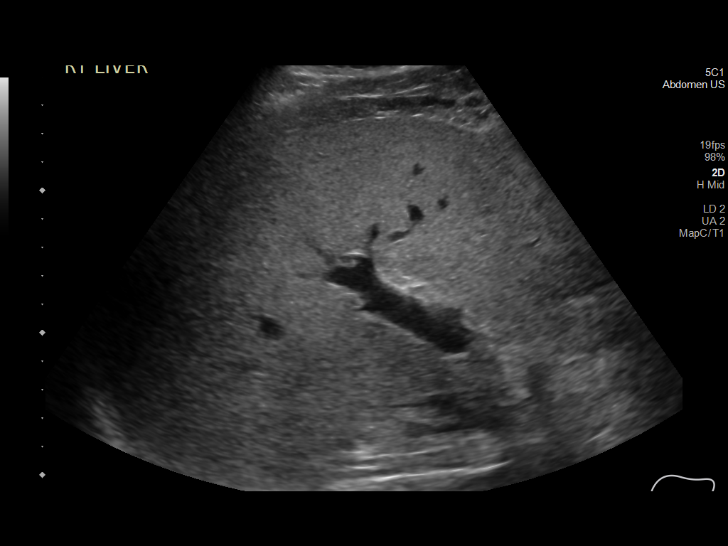
[im 46/56]
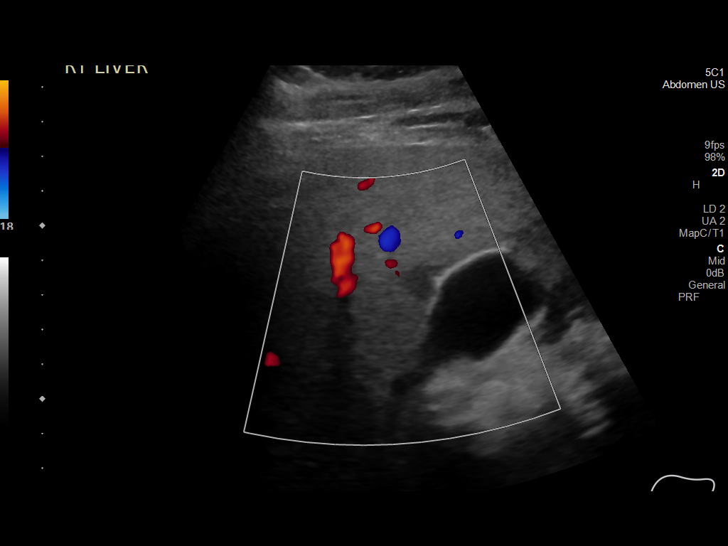
[im 51/56]
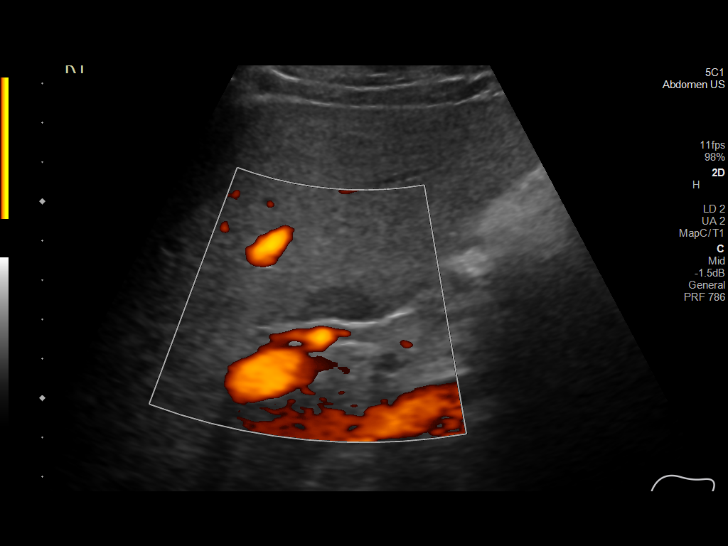
[im 56/56]
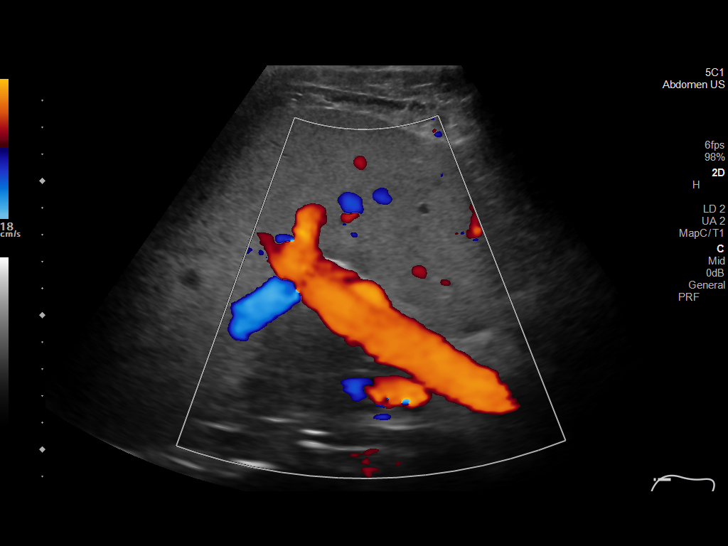

[14 of 25 positions shown; findings below may reference images not displayed]

FINDINGS: Gallbladder:

The gallbladder is nondilated, with a small amount of intraluminal
sludge. No wall thickening or pericholecystic fluid. Negative
sonographic Murphy sign.

Common bile duct:

Diameter: 1.7 mm

Liver:

Diffusely increased liver echogenicity, with mildly nodular contours
of the left hepatic lobe. There are 2 hypoechoic areas, 1 of which
along the gallbladder measuring 1.8 x 1.3 by 0.9 cm and another
along the left hepatic lobe liver edge measuring 1.3 x 0.7 x 1 4 cm.
Portal vein is patent on color Doppler imaging with normal direction
of blood flow towards the liver.

Other: None.
IMPRESSION: Sonographic appearance suggests diffuse hepatic steatosis with
nodular contours of the left hepatic lobe, and 2 hypoechoic areas
likely representing focal fatty sparing. This could be confirmed
with MRI.

No evidence of acute cholecystitis. Nondilated gallbladder with
small amount of intraluminal sludge.
